# Patient Record
Sex: Female | Born: 1940 | Race: Black or African American | Hispanic: No | Smoking: Former smoker
Health system: Southern US, Community
[De-identification: ages and names within clinical notes are randomized; demographics above are authoritative.]

## PROBLEM LIST (undated history)

## (undated) DIAGNOSIS — M542 Cervicalgia: Secondary | ICD-10-CM

## (undated) DIAGNOSIS — M549 Dorsalgia, unspecified: Secondary | ICD-10-CM

## (undated) DIAGNOSIS — I10 Essential (primary) hypertension: Secondary | ICD-10-CM

## (undated) DIAGNOSIS — Z860100 Personal history of colon polyps, unspecified: Secondary | ICD-10-CM

## (undated) DIAGNOSIS — R51 Headache: Secondary | ICD-10-CM

## (undated) DIAGNOSIS — R238 Other skin changes: Secondary | ICD-10-CM

## (undated) DIAGNOSIS — J302 Other seasonal allergic rhinitis: Secondary | ICD-10-CM

## (undated) DIAGNOSIS — G8929 Other chronic pain: Secondary | ICD-10-CM

## (undated) DIAGNOSIS — M5430 Sciatica, unspecified side: Secondary | ICD-10-CM

## (undated) DIAGNOSIS — R233 Spontaneous ecchymoses: Secondary | ICD-10-CM

## (undated) DIAGNOSIS — I2510 Atherosclerotic heart disease of native coronary artery without angina pectoris: Secondary | ICD-10-CM

## (undated) DIAGNOSIS — M1990 Unspecified osteoarthritis, unspecified site: Secondary | ICD-10-CM

## (undated) DIAGNOSIS — F419 Anxiety disorder, unspecified: Secondary | ICD-10-CM

## (undated) DIAGNOSIS — E785 Hyperlipidemia, unspecified: Secondary | ICD-10-CM

## (undated) DIAGNOSIS — I739 Peripheral vascular disease, unspecified: Secondary | ICD-10-CM

## (undated) HISTORY — PX: CARDIAC CATHETERIZATION: SHX172

## (undated) HISTORY — PX: CATARACT EXTRACTION: SUR2

## (undated) HISTORY — PX: TONSILLECTOMY: SUR1361

## (undated) HISTORY — PX: ABDOMINAL HYSTERECTOMY: SHX81

## (undated) HISTORY — PX: BUNIONECTOMY: SHX129

## (undated) HISTORY — PX: ESOPHAGOGASTRODUODENOSCOPY: SHX1529

## (undated) HISTORY — DX: Peripheral vascular disease, unspecified: I73.9

## (undated) HISTORY — PX: COLONOSCOPY: SHX174

---

## 2000-06-08 ENCOUNTER — Encounter: Payer: Self-pay | Admitting: Family Medicine

## 2000-06-08 ENCOUNTER — Ambulatory Visit (HOSPITAL_COMMUNITY): Admission: RE | Admit: 2000-06-08 | Discharge: 2000-06-08 | Payer: Self-pay | Admitting: Family Medicine

## 2000-10-24 ENCOUNTER — Ambulatory Visit (HOSPITAL_COMMUNITY): Admission: RE | Admit: 2000-10-24 | Discharge: 2000-10-24 | Payer: Self-pay | Admitting: Cardiology

## 2000-10-25 ENCOUNTER — Ambulatory Visit (HOSPITAL_COMMUNITY): Admission: RE | Admit: 2000-10-25 | Discharge: 2000-10-25 | Payer: Self-pay | Admitting: *Deleted

## 2000-12-08 ENCOUNTER — Encounter: Payer: Self-pay | Admitting: Family Medicine

## 2000-12-08 ENCOUNTER — Ambulatory Visit (HOSPITAL_COMMUNITY): Admission: RE | Admit: 2000-12-08 | Discharge: 2000-12-08 | Payer: Self-pay | Admitting: Family Medicine

## 2001-02-05 ENCOUNTER — Encounter: Payer: Self-pay | Admitting: Family Medicine

## 2001-02-05 ENCOUNTER — Ambulatory Visit (HOSPITAL_COMMUNITY): Admission: RE | Admit: 2001-02-05 | Discharge: 2001-02-05 | Payer: Self-pay | Admitting: Family Medicine

## 2001-07-05 ENCOUNTER — Encounter: Payer: Self-pay | Admitting: Family Medicine

## 2001-07-05 ENCOUNTER — Ambulatory Visit (HOSPITAL_COMMUNITY): Admission: RE | Admit: 2001-07-05 | Discharge: 2001-07-05 | Payer: Self-pay | Admitting: Family Medicine

## 2001-08-30 ENCOUNTER — Encounter: Payer: Self-pay | Admitting: Emergency Medicine

## 2001-08-30 ENCOUNTER — Inpatient Hospital Stay (HOSPITAL_COMMUNITY): Admission: EM | Admit: 2001-08-30 | Discharge: 2001-08-31 | Payer: Self-pay | Admitting: Emergency Medicine

## 2001-09-04 ENCOUNTER — Encounter (HOSPITAL_COMMUNITY): Admission: RE | Admit: 2001-09-04 | Discharge: 2001-10-04 | Payer: Self-pay | Admitting: Family Medicine

## 2001-10-30 ENCOUNTER — Ambulatory Visit (HOSPITAL_COMMUNITY): Admission: RE | Admit: 2001-10-30 | Discharge: 2001-10-30 | Payer: Self-pay | Admitting: Family Medicine

## 2001-10-30 ENCOUNTER — Encounter: Payer: Self-pay | Admitting: Family Medicine

## 2001-12-07 ENCOUNTER — Encounter: Payer: Self-pay | Admitting: Family Medicine

## 2001-12-07 ENCOUNTER — Encounter (HOSPITAL_COMMUNITY): Admission: RE | Admit: 2001-12-07 | Discharge: 2002-01-06 | Payer: Self-pay | Admitting: Family Medicine

## 2002-04-03 ENCOUNTER — Encounter: Payer: Self-pay | Admitting: Internal Medicine

## 2002-04-03 ENCOUNTER — Encounter: Admission: RE | Admit: 2002-04-03 | Discharge: 2002-04-03 | Payer: Self-pay | Admitting: Internal Medicine

## 2002-09-18 ENCOUNTER — Encounter: Admission: RE | Admit: 2002-09-18 | Discharge: 2002-09-18 | Payer: Self-pay | Admitting: Gastroenterology

## 2002-09-18 ENCOUNTER — Encounter: Payer: Self-pay | Admitting: Gastroenterology

## 2002-10-02 ENCOUNTER — Ambulatory Visit (HOSPITAL_COMMUNITY): Admission: RE | Admit: 2002-10-02 | Discharge: 2002-10-02 | Payer: Self-pay | Admitting: Gastroenterology

## 2002-10-02 ENCOUNTER — Encounter (INDEPENDENT_AMBULATORY_CARE_PROVIDER_SITE_OTHER): Payer: Self-pay | Admitting: Specialist

## 2003-01-28 ENCOUNTER — Ambulatory Visit (HOSPITAL_COMMUNITY): Admission: RE | Admit: 2003-01-28 | Discharge: 2003-01-28 | Payer: Self-pay | Admitting: Internal Medicine

## 2003-03-12 ENCOUNTER — Encounter: Admission: RE | Admit: 2003-03-12 | Discharge: 2003-03-12 | Payer: Self-pay | Admitting: Internal Medicine

## 2003-04-18 ENCOUNTER — Encounter: Admission: RE | Admit: 2003-04-18 | Discharge: 2003-04-18 | Payer: Self-pay | Admitting: Internal Medicine

## 2003-11-30 ENCOUNTER — Ambulatory Visit (HOSPITAL_COMMUNITY): Admission: RE | Admit: 2003-11-30 | Discharge: 2003-11-30 | Payer: Self-pay | Admitting: Sports Medicine

## 2004-06-01 ENCOUNTER — Encounter: Admission: RE | Admit: 2004-06-01 | Discharge: 2004-06-01 | Payer: Self-pay | Admitting: Internal Medicine

## 2004-07-25 ENCOUNTER — Inpatient Hospital Stay (HOSPITAL_COMMUNITY): Admission: EM | Admit: 2004-07-25 | Discharge: 2004-07-27 | Payer: Self-pay | Admitting: Emergency Medicine

## 2004-07-25 ENCOUNTER — Ambulatory Visit: Payer: Self-pay | Admitting: *Deleted

## 2004-09-16 ENCOUNTER — Ambulatory Visit (HOSPITAL_COMMUNITY): Admission: RE | Admit: 2004-09-16 | Discharge: 2004-09-16 | Payer: Self-pay | Admitting: Family Medicine

## 2004-09-27 ENCOUNTER — Encounter: Admission: RE | Admit: 2004-09-27 | Discharge: 2004-09-27 | Payer: Self-pay | Admitting: Internal Medicine

## 2005-07-14 ENCOUNTER — Ambulatory Visit (HOSPITAL_COMMUNITY): Admission: RE | Admit: 2005-07-14 | Discharge: 2005-07-14 | Payer: Self-pay | Admitting: Internal Medicine

## 2005-08-03 ENCOUNTER — Ambulatory Visit (HOSPITAL_COMMUNITY): Admission: RE | Admit: 2005-08-03 | Discharge: 2005-08-03 | Payer: Self-pay | Admitting: Gastroenterology

## 2005-08-03 ENCOUNTER — Encounter (INDEPENDENT_AMBULATORY_CARE_PROVIDER_SITE_OTHER): Payer: Self-pay | Admitting: *Deleted

## 2005-10-03 ENCOUNTER — Emergency Department (HOSPITAL_COMMUNITY): Admission: EM | Admit: 2005-10-03 | Discharge: 2005-10-04 | Payer: Self-pay | Admitting: Emergency Medicine

## 2006-07-18 ENCOUNTER — Encounter: Admission: RE | Admit: 2006-07-18 | Discharge: 2006-07-18 | Payer: Self-pay | Admitting: Internal Medicine

## 2006-12-08 ENCOUNTER — Encounter: Admission: RE | Admit: 2006-12-08 | Discharge: 2006-12-08 | Payer: Self-pay | Admitting: Gastroenterology

## 2007-01-18 ENCOUNTER — Ambulatory Visit (HOSPITAL_COMMUNITY): Admission: RE | Admit: 2007-01-18 | Discharge: 2007-01-18 | Payer: Self-pay | Admitting: Gastroenterology

## 2007-02-15 HISTORY — PX: CHOLECYSTECTOMY: SHX55

## 2007-04-18 ENCOUNTER — Ambulatory Visit: Payer: Self-pay | Admitting: Cardiovascular Disease

## 2007-04-25 ENCOUNTER — Ambulatory Visit: Payer: Self-pay

## 2007-04-25 ENCOUNTER — Encounter: Payer: Self-pay | Admitting: Cardiovascular Disease

## 2007-06-15 ENCOUNTER — Encounter (INDEPENDENT_AMBULATORY_CARE_PROVIDER_SITE_OTHER): Payer: Self-pay | Admitting: General Surgery

## 2007-06-15 ENCOUNTER — Ambulatory Visit (HOSPITAL_COMMUNITY): Admission: RE | Admit: 2007-06-15 | Discharge: 2007-06-15 | Payer: Self-pay | Admitting: General Surgery

## 2007-08-20 ENCOUNTER — Encounter: Admission: RE | Admit: 2007-08-20 | Discharge: 2007-08-20 | Payer: Self-pay | Admitting: Internal Medicine

## 2008-01-22 ENCOUNTER — Inpatient Hospital Stay (HOSPITAL_COMMUNITY): Admission: AD | Admit: 2008-01-22 | Discharge: 2008-01-24 | Payer: Self-pay | Admitting: Cardiology

## 2008-02-21 ENCOUNTER — Encounter (HOSPITAL_COMMUNITY): Admission: RE | Admit: 2008-02-21 | Discharge: 2008-04-28 | Payer: Self-pay | Admitting: Cardiology

## 2008-07-19 ENCOUNTER — Encounter
Admission: RE | Admit: 2008-07-19 | Discharge: 2008-07-19 | Payer: Self-pay | Admitting: Physical Medicine and Rehabilitation

## 2008-08-20 ENCOUNTER — Ambulatory Visit (HOSPITAL_COMMUNITY): Admission: RE | Admit: 2008-08-20 | Discharge: 2008-08-20 | Payer: Self-pay | Admitting: Internal Medicine

## 2009-06-19 ENCOUNTER — Encounter: Admission: RE | Admit: 2009-06-19 | Discharge: 2009-06-19 | Payer: Self-pay | Admitting: Cardiology

## 2009-06-23 ENCOUNTER — Ambulatory Visit (HOSPITAL_COMMUNITY): Admission: RE | Admit: 2009-06-23 | Discharge: 2009-06-23 | Payer: Self-pay | Admitting: Cardiology

## 2009-08-25 ENCOUNTER — Ambulatory Visit (HOSPITAL_COMMUNITY): Admission: RE | Admit: 2009-08-25 | Discharge: 2009-08-25 | Payer: Self-pay | Admitting: Internal Medicine

## 2010-03-07 ENCOUNTER — Encounter: Payer: Self-pay | Admitting: Internal Medicine

## 2010-05-04 LAB — POCT I-STAT 3, VENOUS BLOOD GAS (G3P V)
Acid-base deficit: 1 mmol/L (ref 0.0–2.0)
Bicarbonate: 24.6 mEq/L — ABNORMAL HIGH (ref 20.0–24.0)
O2 Saturation: 60 %
TCO2: 26 mmol/L (ref 0–100)
pCO2, Ven: 43.6 mmHg — ABNORMAL LOW (ref 45.0–50.0)
pH, Ven: 7.36 — ABNORMAL HIGH (ref 7.250–7.300)
pO2, Ven: 33 mmHg (ref 30.0–45.0)

## 2010-05-04 LAB — GLUCOSE, CAPILLARY
Glucose-Capillary: 153 mg/dL — ABNORMAL HIGH (ref 70–99)
Glucose-Capillary: 72 mg/dL (ref 70–99)

## 2010-05-04 LAB — POCT I-STAT 3, ART BLOOD GAS (G3+)
Acid-Base Excess: 1 mmol/L (ref 0.0–2.0)
Bicarbonate: 25.6 mEq/L — ABNORMAL HIGH (ref 20.0–24.0)
O2 Saturation: 88 %
TCO2: 27 mmol/L (ref 0–100)
pCO2 arterial: 41.2 mmHg (ref 35.0–45.0)
pO2, Arterial: 55 mmHg — ABNORMAL LOW (ref 80.0–100.0)

## 2010-05-31 LAB — GLUCOSE, CAPILLARY: Glucose-Capillary: 102 mg/dL — ABNORMAL HIGH (ref 70–99)

## 2010-06-15 ENCOUNTER — Emergency Department (HOSPITAL_COMMUNITY): Payer: Medicare Other

## 2010-06-15 ENCOUNTER — Emergency Department (HOSPITAL_COMMUNITY)
Admission: EM | Admit: 2010-06-15 | Discharge: 2010-06-15 | Disposition: A | Discharge status: Home / Self Care | Payer: Medicare Other | Attending: Emergency Medicine | Admitting: Emergency Medicine

## 2010-06-15 DIAGNOSIS — R05 Cough: Secondary | ICD-10-CM | POA: Insufficient documentation

## 2010-06-15 DIAGNOSIS — I2510 Atherosclerotic heart disease of native coronary artery without angina pectoris: Secondary | ICD-10-CM | POA: Insufficient documentation

## 2010-06-15 DIAGNOSIS — J40 Bronchitis, not specified as acute or chronic: Secondary | ICD-10-CM | POA: Insufficient documentation

## 2010-06-15 DIAGNOSIS — R062 Wheezing: Secondary | ICD-10-CM | POA: Insufficient documentation

## 2010-06-15 DIAGNOSIS — I10 Essential (primary) hypertension: Secondary | ICD-10-CM | POA: Insufficient documentation

## 2010-06-15 DIAGNOSIS — R609 Edema, unspecified: Secondary | ICD-10-CM | POA: Insufficient documentation

## 2010-06-15 DIAGNOSIS — M7989 Other specified soft tissue disorders: Secondary | ICD-10-CM | POA: Insufficient documentation

## 2010-06-15 DIAGNOSIS — R059 Cough, unspecified: Secondary | ICD-10-CM | POA: Insufficient documentation

## 2010-06-15 DIAGNOSIS — R0602 Shortness of breath: Secondary | ICD-10-CM | POA: Insufficient documentation

## 2010-06-15 LAB — BASIC METABOLIC PANEL
CO2: 29 mEq/L (ref 19–32)
Calcium: 9.7 mg/dL (ref 8.4–10.5)
GFR calc Af Amer: >60 mL/min (ref >60)
GFR calc non Af Amer: 53 mL/min — ABNORMAL LOW (ref >60)
Sodium: 143 mEq/L (ref 135–145)

## 2010-06-15 LAB — DIFFERENTIAL
Basophils Absolute: 0 10*3/uL (ref 0.0–0.1)
Lymphocytes Relative: 31 % (ref 12–46)
Monocytes Absolute: 1 10*3/uL (ref 0.1–1.0)
Monocytes Relative: 8 % (ref 3–12)
Neutro Abs: 7.1 10*3/uL (ref 1.7–7.7)

## 2010-06-15 LAB — POCT CARDIAC MARKERS
CKMB, poc: <1 ng/mL — ABNORMAL LOW (ref 1.0–8.0)
Myoglobin, poc: 85 ng/mL (ref 12–200)

## 2010-06-15 LAB — CBC
HCT: 34.4 % — ABNORMAL LOW (ref 36.0–46.0)
Hemoglobin: 11 g/dL — ABNORMAL LOW (ref 12.0–15.0)
MCHC: 32 g/dL (ref 30.0–36.0)

## 2010-06-29 NOTE — Assessment & Plan Note (Signed)
[facility]                            CARDIOLOGY OFFICE NOTE   NAME:Kathryn [name]                  MRN:          [id number]  DATE:04/18/2007                            DOB:          Jul 10, 1940    [name] a 70 year old patient referred for chest pain, abnormal  EKG.  Coronary risk factors including diabetes and the patient is a  nonsmoker. Family history is remarkable for premature coronary disease  in the mother's side.  Cholesterol status is unknown and the patient has  significant hypertension.  The patient's chest pain was reactive about 3  weeks ago.  Her sister died of stomach problems.  The patient initially  thought she had significant stress.  She had substernal chest pain and  there was some radiation to the left arm.  There is no associated  diaphoresis.  She has continued right neck pain which sounds poor  muscular now.  In general the patient is fairly sedentary.  She does not  exercise but she does run about volunteering a lot.  In the last 2-1/2  weeks she has not had any recurrent pains.   She has not had any PND, orthopnea.  There has been no cough or  pleuritic component.   In talking to the patient, she said she had a stress test many years ago  at [facility], but nothing recently.   The pain was clearly aggravated by the stress of her sister's illness  and death.   ROS: The patient's review of systems otherwise negative.   CRF's: In regards to her risk factors, she has been on blood pressure  medicine for a while.  She says she was on the verge of diabetes and was  just placed on sugar pills within the last year.   PMH: The the patient's past medical history is otherwise remarkable for  tonsillectomy and hysterectomy, history of allergies, central obesity,  diabetes and hypertension.  I do not have cholesterol on the patient.   There is also question history of asthma.   Allergies:The patient has an  intolerance to CODEINE.  She also indicates an intolerance to ASPIRIN along with her asthma.   Social:The patient is married.  Her husband is a diabetic.  She has 4  children who are well.  She does a lot of volunteering at [employer].  She does not smoke or drink.  She does all of her activities of daily living and continues to drive.   FAMILY HISTORY:  Remarkable for mother dying prematurely of heart  disease.  She does not recollect how her father passed.   Meds:Her current medications include Janumet 50/500 1 tab a day,  furosemide question dose, Diovan hydrochlorothiazide 160.12.5.   EXAM:  Remarkable for an overweight black female in no distress.  Blood pressure is 150/80, pulse 67 regular, respiratory rate 14, weight  211.  Affect appropriate.  HEENT:  Unremarkable.  Carotids are normal without bruit, no lymphadenopathy, thyromegaly, or  JVP elevation.  LUNGS:  Clear with good diaphragmatic motion.  No wheezing.  S1-S2 with normal heart sounds.  PMI normal.  ABDOMEN:  Benign.  Bowel sounds positive.  No bruit, no AAA.  No  hepatosplenomegaly or hepatojugular reflux.  No tenderness.  No bruit.  Distal pulses intact, no edema.  NEURO:  Nonfocal.  SKIN:  Warm and dry.  No muscular weakness.    EKG shows sinus rhythm with poor R wave progression, cannot rule out  anterior MI.   IMPRESSION:  1. Chest pain in the setting of diabetes, hypertension, positive      family history, and abnormal EKG.  Follow-up stress Myoview      indicated.  The patient cannot take aspirin due to intolerance.  2. Hypertension currently well controlled.  Continue current dose of      Diovan and diuretic.  3. Lipid status unknown.  Likely high.  Would probably benefit from      statin therapy.  Will leave this up to Dr. Allyne Gee.  4. Resolving grief from sister's death.  Seems to be doing better.      Consider institution of SSRI or other antidepressant.  5. Diabetes.  Continue to check hemoglobin  A1c quarterly.  Continue      Janumet.  6. Abnormal EKG with poor R wave progression, question anterior      myocardial infarction.  I think it is reasonable to a 2-D      echocardiogram to assess RV and LV function and rule out structural      heart disease and in addition to her stress test.  So long as her      stress test and Myoview are normal,  she will follow with Dr.      Allyne Gee.  If they are abnormal I will see her ASAP.     Noralyn Pick. Eden Emms, MD, [facility]  Electronically Signed    PCN/MedQ  DD: 04/18/2007  DT: 04/18/2007  Job #: [id number]   cc:   Candyce Churn. Allyne Gee, M.D.

## 2010-06-29 NOTE — Op Note (Signed)
NAMEMARCELE, Kathryn [name]           ACCOUNT NO.:  [id number]   MEDICAL RECORD NO.:  [id number]          PATIENT TYPE:  AMB   LOCATION:  SDS                          FACILITY:  [facility]   PHYSICIAN:  Cherylynn Ridges, M.D.    DATE OF BIRTH:  [birth year]   DATE OF PROCEDURE:  06/15/2007  DATE OF DISCHARGE:                               OPERATIVE REPORT   PREOPERATIVE DIAGNOSES:  1. Biliary dyskinesia.  2. Supraumbilical ventral hernia.   POSTOPERATIVE DIAGNOSES:  1. Biliary dyskinesia.  2. Adhesions.  3. Small umbilical hernia.   PROCEDURES:  1. Laparoscopic cholecystectomy with cholangiogram.  2. Enterolysis.  3. Repair of umbilical hernia defects.   SURGEON:  Cherylynn Ridges, MD.   ASSISTANT:  Dr. Janee Morn.   ANESTHESIA:  General endotracheal.   ESTIMATED BLOOD LOSS:  Less than 20 mL.   COMPLICATIONS:  None.   CONDITION:  Stable.   FINDINGS:  The patient had a very small umbilical defect which was  closed with a laparoscopic repair.  She had a single loop of small bowel  in the pelvic area adhesed through the abdominal wall which was twisted  of apparent potential obstruction.   Intraoperative cholangiogram was normal.  There were some chronic  inflammatory adhesions to the gallbladder.   INDICATIONS FOR OPERATION:  The patient is a 70 year old female with  symptomatic biliary dyskinesia and a small umbilical defect who comes in  for operation.   OPERATION:  The patient was taken to the operating room placed on table  in supine position.  After an adequate general endotracheal anesthetic  was administered, she was prepped and draped in the usual sterile manner  and exposed in the midline of the abdomen.   A supraumbilical curvilinear incision about 4 cm long was made using #11  blade taking down to midline fascia.  We could palpate a small umbilical  defect.  We incorporated that into the fascial incision that was made  longitudinally with #15 blade as we bluntly  dissected down into the  peritoneal cavity.  A purse-string suture of 0 Vicryl was passed and  then a Hasson cannula passed into the peritoneal cavity.  It was secured  in place with purse-string suture.   Once inflated with carbon dioxide gas up to a maximum level of  intraabdominal pressure of 15 mmHg.  Two right costal margin of 5-mm  cannulas in the subxiphoid 12-mm cannula passed under direct vision.  The patient was placed in reverse Trendelenburg, the left side was  tilted down and dissection was began.   The gallbladder was grabbed and retracted towards the anterior abdominal  wall and the right upper quadrant.  There were noted adhesions to the  body and the infundibulum of the gallbladder which was bluntly dissected  away exposing the triangle of Calot and hepatoduodenal triangle.  We  dissected out the cystic duct and the cystic artery from these  structures.  We placed Calot along the gallbladder side of the cystic  duct, then made a cholecystodochotomy using the laparoscopic scissors  through which a Cook catheter was passed for the cholangiogram.  This  showed good flow into the duodenum.  No ductal defects.  No distention  or dilatation.  Good proximal flow.   Once cholangiogram was completed, the clip securing the catheter in  place was removed.  We EndoClip the distal cystic duct x3, the catheter  was removed and transected the cystic duct.  Cystic artery was found in  triangle of Calot which was ligated and clipped with EndoClip x2 and  then transected.  We then dissected out the gallbladder from its bed  with minimal difficulty.  We retrieved it from the supraumbilical site.  We closed out the fascia at the supraumbilical site using a purse-string  suture.  As soon as inspecting in that area, we could see a single loop  of small bowel attached to the anterior abdominal wall with possible  twisting.  Therefore, we carefully lysed the adhesions to the structure  using  laparoscopic scissors.  Care was taken to stay away from the bowel  itself and no injury to the bowel was noted.   After we completed the enterolysis and irrigated with small amount of  saline in the right upper quadrant and aspirated all fluid and gas from  above the liver.  We removed all instruments and closed the skin at all  sites using running subcuticular stitch of 4-0 Vicryl at the  supraumbilical and the subxiphoid site; however, the lateral cannula  sites were closed with Dermabond only.  A 0.25% Marcaine with  epinephrine was injected at all sites.  Sterile dressings were applied  including Dermabond, Tegaderm, and Steri-Strips.      Cherylynn Ridges, M.D.  Electronically Signed     JOW/MEDQ  D:  06/15/2007  T:  06/16/2007  Job:  [id number]

## 2010-06-29 NOTE — Cardiovascular Report (Signed)
Kathryn [name], VIEN           ACCOUNT NO.:  [id number]   MEDICAL RECORD NO.:  [id number]          PATIENT TYPE:  INP   LOCATION:  6524                         FACILITY:  [facility]   PHYSICIAN:  Cristy Hilts. Jacinto Halim, MD       DATE OF BIRTH:  Jul 06, 1940   DATE OF PROCEDURE:  01/22/2008  DATE OF DISCHARGE:                            CARDIAC CATHETERIZATION   PROCEDURE PERFORMED:  Percutaneous transluminal coronary angioplasty and  stenting of the mid circumflex coronary artery.   INDICATIONS:  [name] is a 69 year old female with history of  hypertension, hyperlipidemia, history of diet-controlled diabetes who  has been complaining of exertional angina pectoris.  She had undergone  diagnostic cardiac catheterization a week ago at [facility].  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  Given this, she is now brought to the  catheterization lab for elective angioplasty of the same.   Please see my dictation for diagnostic cardiac catheterization report of  the left heart catheterization essentially revealing normal left main,  normal LAD with mild luminal irregularity, large ramus intermediate, and  a large circumflex with a high grade 98% stenosis at the bifurcation of  AV groove branch.   INTERVENTIONAL DATA:  Successful PTCA and stenting of the mid circumflex  coronary artery with implantation of a 2.75 x 15-mm XIENCE stent,  deployed at 16 atmospheric pressure with overall reduction of stenosis  from 90% to 0% with brisk TIMI III to TIMI III flow maintained at the  end of the procedure.   A total of 110 mL of contrast was utilized for diagnostic and  interventional procedure.   RECOMMENDATIONS:  She will need continued risk factor modification.  She  will need Plavix for at least a period of a year and aspirin  indefinitely.   TECHNICAL PROCEDURE:  Under usual sterile precautions using a Doppler  needle, I was able to  obtain right femoral arterial access with  difficulty.  Then a 7-French Voda 3.5 guide was utilized to engage left  main coronary artery because of severe ventricularization.  A 7-French  FL-4 with side hole was utilized to engage left main coronary artery.  Using Angiomax for anticoagulation Asahi Prowater was advanced to the  circumflex of coronary artery.  A predilatation with a 2.5 x 15-mm  Voyager balloon was performed at around 12 atmospheric pressure for 45  seconds and intracoronary nitroglycerin was administered.  There was  significant recoil noted.  Hence it was stented with a 2.75 x 15-mm  XIENCE stent at 16 atmospheric pressure for about 30 seconds.  Angiography revealed  excellent results without any residual dissection or thrombus.  Intracoronary nitroglycerin was administered.  Guidewire was withdrawn  angiographically.  Guide catheter disengaged and pulled out of the body.  The patient tolerated the procedure.  No immediate complication noted.      Cristy Hilts. Jacinto Halim, MD  Electronically Signed     JRG/MEDQ  D:  01/22/2008  T:  01/23/2008  Job:  [id number]   cc:   Candyce Churn. Allyne Gee, M.D.

## 2010-06-29 NOTE — Discharge Summary (Signed)
[name], Kathryn [name]           ACCOUNT NO.:  [id number]   MEDICAL RECORD NO.:  [id number]          PATIENT TYPE:  INP   LOCATION:  6524                         FACILITY:  [facility]   PHYSICIAN:  Cristy Hilts. Jacinto Halim, MD       DATE OF BIRTH:  Aug 11, 1940   DATE OF ADMISSION:  01/22/2008  DATE OF DISCHARGE:  01/24/2008                               DISCHARGE SUMMARY   DISCHARGE DIAGNOSES:  1. Elective circumflex percutaneous coronary intervention and stenting      this admission with a small CK-MB bump to 155 with 11 MBs post      percutaneous coronary intervention.  2. Non-insulin dependant diabetes.  3. Treated hypertension with eczema.  4. Treated dyslipidemia.   HOSPITAL COURSE:  [name] is a pleasant 70 year old female who had  been seen by Dr. Elsie Lincoln in the past.  She was referred by Dr. Allyne Gee  for evaluation of chest pain and was seen back in November.  She had a  negative Cardiolite in 2007.  She does have risk factors for coronary  artery disease.  The patient was seen by Dr. Jacinto Halim.  He set her up for  elective catheterization, which was done at the [facility] on January 15, 2008.  This revealed 50% RCA proximally, 40% distal RCA normal left  main, 30%, mid LAD narrowing, and an 85% narrowing of the circumflex  after the OM1.  The OM1 itself was a small vessel with an 80% proximal  narrowing.  Renal arteries were normal as were the iliacs.  Her EF was  60%.  She was set up for elective circumflex intervention, which was  done January 22, 2008, by Dr. Jacinto Halim.  Post PCI, her CKs peaked to 155  with 11 MBs.  We kept her on extra 24 hours.  We feel she can be  discharged on January 24, 2008, pending her followup enzymes.   DISCHARGE MEDICATIONS:  1. Plavix 75 mg a day.  2. Diovan and hydrochlorothiazide 160/12.5 daily.  3. She will resume her Janumet tomorrow on January 25, 2008.  4. Aspirin 81 mg 2 tablets a day.  5. Crestor 10 mg a day.  6. Coreg 6.25 mg b.i.d.  7.  Nitroglycerin sublingual p.r.n.   LABORATORY DATA:  White count 10.0, hemoglobin 10.8, hematocrit 33.6,  platelets 315.  At discharge, her fourth troponin is pending, but her  peak troponin was 0.64.  Sodium 139, potassium 2.7, BUN 11, creatinine  0.87.  EKG shows sinus rhythm without acute changes.   DISPOSITION:  The patient will be discharged today pending her fourth  troponin level.  She will follow up with Dr. Jacinto Halim and Dr. Allyne Gee.      Abelino Derrick, P.A.      Cristy Hilts. Jacinto Halim, MD  Electronically Signed    LKK/MEDQ  D:  01/24/2008  T:  01/24/2008  Job:  [id number]   cc:   Candyce Churn. Allyne Gee, M.D.

## 2010-07-02 NOTE — Op Note (Signed)
   NAMEKATRIN, Kathryn [name]                     ACCOUNT NO.:  [id number]   MEDICAL RECORD NO.:  [id number]                   PATIENT TYPE:  AMB   LOCATION:  ENDO                                 FACILITY:  [facility]   PHYSICIAN:  Anselmo Rod, M.D.               DATE OF BIRTH:  [birth year]   DATE OF PROCEDURE:  10/02/2002  DATE OF DISCHARGE:                                 OPERATIVE REPORT   PROCEDURE PERFORMED:  Esophagogastroduodenoscopy.   ENDOSCOPIST:  Anselmo Rod, M.D.   INSTRUMENT USED:  Olympus video panendoscope.   INDICATION FOR PROCEDURE:  Dysphagia in a 70 year old African-American  female.  Rule out stricture, papilloma, esophagitis, etc.   PREPROCEDURE PREPARATION:  Informed consent was procured from the patient.  The patient was fasted for eight hours prior to the procedure.   PREPROCEDURE PHYSICAL:  VITAL SIGNS:  The patient had stable vital signs.  NECK:  Supple.  CHEST:  Clear to auscultation.  S1, S2 regular.  ABDOMEN:  Soft with normal bowel sounds.   DESCRIPTION OF PROCEDURE:  The patient was placed in the left lateral  decubitus position and sedated with 60 mg of Demerol and 6 mg of Versed  intravenously.  Once the patient was adequately sedate and maintained on low-  flow oxygen and continuous cardiac monitoring, the Olympus video  panendoscope was advanced through the mouthpiece, over the tongue, into the  esophagus under direct vision.  The entire esophagus appeared normal with no  evidence of strictures, masses, esophagitis, or Barrett's mucosa.  The scope  was then advanced into the stomach.  The entire gastric mucosa appeared  healthy.  Retroflexion in the high cardia revealed no abnormalities.  The  proximal small bowel appeared normal.   IMPRESSION:  Normal esophagogastroduodenoscopy with widely patent esophagus.   RECOMMENDATIONS:  Proceed with a colonoscopy at this time.                                               Anselmo Rod,  M.D.   JNM/MEDQ  D:  10/02/2002  T:  10/03/2002  Job:  [id number]   cc:   Candyce Churn. Allyne Gee, M.D.  [address]  [address]  [place]  [place] [address]  Fax: [phone]

## 2010-07-02 NOTE — Discharge Summary (Signed)
   NAMEHEIDY, Kathryn [name]                       ACCOUNT NO.:  [id number]   MEDICAL RECORD NO.:  [id number]                  PATIENT TYPE:   LOCATION:                                       FACILITY:   PHYSICIAN:  Donna Bernard, M.D.             DATE OF BIRTH:   DATE OF ADMISSION:  DATE OF DISCHARGE:  08/31/2001                                 DISCHARGE SUMMARY   FINAL DIAGNOSIS:  Chest pain.   FINAL DISPOSITION:  1. The patient is discharged to home.  2. The patient is to maintain all her usual medicines.  3. Darvocet-N 100 one q.4-6h. p.r.n. for pain.   INITIAL HISTORY AND PHYSICAL:  Please see H&P as dictated.   HOSPITAL COURSE:  This patient is a 70 year old female who presented to the  emergency room on the day of admission with several hours history of chest  pain off and on.  The patient woke up and had an episode of vomiting after  taking Ultracet.  The patient had chest pain.  She was seen in the emergency  room.  She was given nitroglycerin which seemed to help somewhat.  The  patient was admitted to the hospital.  She was placed on rule out MI  protocol.  Of note, the cardiac enzymes came back normal.  Telemetry  remained normal.  Of note, the patient had catheterization approximately one  year prior to admission with no significant blockage of the coronary  arteries at that time.  The morning following admission, the patient was  feeling better.  She was describing her pain as sharp.  If she took a deep  breath, she could feel it in the anterior chest.  In addition, the patient  noted tenderness when pressed in the sternal region.  She felt that the  majority of her chest pain was due to musculoskeletal features.   This was discussed with the patient and she was discharged home with the  diagnosis and disposition as noted above.                                               Donna Bernard, M.D.    [name]  D:  01/14/2002  T:  01/14/2002  Job:   [id number]

## 2010-07-02 NOTE — Op Note (Signed)
Kathryn [name], Kathryn [name]           ACCOUNT NO.:  [id number]   MEDICAL RECORD NO.:  [id number]          PATIENT TYPE:  AMB   LOCATION:  ENDO                         FACILITY:  [facility]   PHYSICIAN:  Anselmo Rod, M.D.  DATE OF BIRTH:  1940/07/25   DATE OF PROCEDURE:  08/03/2005  DATE OF DISCHARGE:                                 OPERATIVE REPORT   PROCEDURE PERFORMED:  Colonoscopy with multiple cold biopsies.   ENDOSCOPIST:  Anselmo Rod, M.D.   INSTRUMENT USED:  Olympus video colonoscope.   INDICATION FOR PROCEDURE:  [age]-year-old African American female  undergoing screening colonoscopy to rule out colon polyps, masses, etc.  The  patient has a history of fecal incontinence, rule out inflammatory bowel  disease.   PREPROCEDURE PREPARATION:  Informed consent was procured from the patient.  The patient was fasted for 4 hours prior to the procedure and prepped with  32 Osmoprep pill the night of and on the morning of the procedure.  Risks  and benefits of the procedure including a 10% miss rate of cancer in polyps  were discussed with the patient as well.   PREPROCEDURE PHYSICAL:  VITAL SIGNS: The patient had stable vital signs.  NECK: Supple.  CHEST: Clear to auscultation.  CARDIAC: S1 and S2 regular.  ABDOMEN: Soft with normal bowel sounds.   DESCRIPTION OF THE PROCEDURE:  The patient was placed in the left lateral  decubitus position and sedated with 100 mcg of fentanyl and 10 mg of Versed  in slow incremental doses.  Once the patient was adequately sedate and  maintained on low-flow oxygen and continuous cardiac monitoring, the Olympus  video colonoscope was advanced from the rectum to the cecum.  The  appendiceal orifice and ileocecal valve were clearly visualized and  photographed.  A small cecal AVM was noted; this was not bleeding and  therefore no intervention was undertaken. Multiple small sessile polyps were  biopsied from the rectosigmoid colon.  A small  sessile polyp was biopsied  from the proximal right colon.  There were scattered diverticula seen  throughout the colon.  Retroflexion in the rectum revealed no abnormalities.  The patient tolerated the procedure well without immediate complications.   IMPRESSION:  1.Multiple small colonic polyps, removed by cold biopsy, some  from the rectosigmoid colon, 1 from the proximal right colon.  2.Scattered diverticulosis.   RECOMMENDATIONS:  1.Await pathology results.  2.Avoid all nonsteroidals including aspirin for the next 2 weeks.  3.Repeat colonoscopy depending on pathology results.  4.Outpatient followup in the next 2 weeks for further recommendations.  5.The importance of a high-fiber diet has been discussed with her and  brochures on diverticulosis have been given to her for her education.      Anselmo Rod, M.D.  Electronically Signed     JNM/MEDQ  D:  08/03/2005  T:  08/04/2005  Job:  [id number]   cc:   Candyce Churn. Allyne Gee, M.D.  Fax: [phone]

## 2010-07-02 NOTE — Procedures (Signed)
[facility]  Patient:    Kathryn [name], Kathryn [name] Visit Number: [id number] MRN: [id number]          Service Type: [facility] Location: [facility] Attending Physician:  Lilyan Punt Dictated by:   Kari Baars, M.D. Proc. Date: 08/30/01 Admit Date:  09/04/2001                            EKG Interpretations  TIME/DATE:  1657, August 30, 2001  DESCRIPTION:  The rhythm is sinus rhythm with a rate in the 80s.  Otherwise, normal electrocardiogram. Dictated by:   Kari Baars, M.D. Attending Physician:  Lilyan Punt DD:  08/30/01 TD:  09/05/01 Job: [id number]

## 2010-07-02 NOTE — Procedures (Signed)
   Kathryn [name], Kathryn [name]                     ACCOUNT NO.:  [id number]   MEDICAL RECORD NO.:  [id number]                   PATIENT TYPE:  OUT   LOCATION:  RAD                                  FACILITY:  [facility]   PHYSICIAN:  Donna Bernard, M.D.             DATE OF BIRTH:  10-20-1940   DATE OF PROCEDURE:  08/31/2001  DATE OF DISCHARGE:  10/30/2001                                EKG INTERPRETATION   EKG reveals normal sinus rhythm with no significant ST-T changes.                                               Donna Bernard, M.D.    [name]  D:  12/03/2001  T:  12/03/2001  Job:  [id number]

## 2010-07-02 NOTE — H&P (Signed)
Kathryn [name], Kathryn NO.:  [id number]   MEDICAL RECORD NO.:  [id number]          PATIENT TYPE:  EMS   LOCATION:  MAJO                         FACILITY:  [facility]   PHYSICIAN:  Elliot Cousin, M.D.    DATE OF BIRTH:  Aug 05, 1940   DATE OF ADMISSION:  07/25/2004  DATE OF DISCHARGE:                                HISTORY & PHYSICAL   PRIMARY CARE PHYSICIAN:  Kathryn [name], M.D.   CHIEF COMPLAINT:  Chest pain, swollen lips, and rash.   HISTORY OF PRESENT ILLNESS:  Kathryn [name] is a 70 year old lady with a past  medical history significant for asthma, anxiety, and chest pain, who  presents to the emergency department with a chief complaint of chest pain  which started early this morning. The patient states that she got up out of  bed this morning in no acute distress. As she was moving around she  developed chest pain which was in the central part of her chest and a little  to the right of center. Over the course of the day the chest pain has come  and gone. It usually lasts between 1-5 minutes and resolves spontaneously.  The pain is described as achy and at its worse the intensity was 8/10. It is  now 1 to 2 over 10. She had associated nausea and indigestion, but no  vomiting, no lightheadedness, no diaphoresis, no radiation to her arms, no  pleurisy, and no upper respiratory tract infection symptoms. She did have  some transient shortness of breath. She also had associated palpitations.  She has had a dry cough over the past week. She denies fever and chills.   The patient also complains of lip swelling. Approximately 20 minutes after  she developed chest pain, the patient's bottom lip began swelling. This was  followed by her upper lip swelling and a diffuse rash described as welts  which started on her chin and then spread to her torso, her back and her  arms. The patient denies any difficulty breathing or swelling of her throat.  She denies any recent changes  in soaps, detergents, lotions, or perfumes.  She did not have anything to eat this morning. She did not take any new  medications this morning. Her diet has been more or less unchanged over the  past few days.  She denies any insect bites. The rash itches.  The patient's  brother died of an unexpected heart attack four days ago. Her brother's  funeral was this morning. The patient believes that her symptoms may be  secondary to anxiety surrounding her brother's death. The patient's family  history is significantly positive for coronary artery disease. Not only has  a brother recently died from a myocardial infarction, the patient's mother  also died of a myocardial infarction and she also had another brother who  died of a myocardial infarction.   When the patient was evaluated in the emergency department, her EKG was  within normal limits with no acute abnormalities. The EKG revealed a normal  sinus rhythm with a heart rate of 87 beats per minute. Her chest x-ray  revealed minimal bibasilar interstitial edema and cardiomegaly. She was  hemodynamically stable. Her cardiac markers were essentially negative. She  is chest pain free now( no nitro given).  However, given the patient's  family history, she will be admitted for further evaluation and management.   PAST MEDICAL HISTORY:  1.  History of chest pain in July 2003, admitted to [facility].      Myocardial infarction ruled out. The patient also has a history of      cardiac catheterization in 2001 or 2002 at [facility]. I am      unable to locate the results of the cardiac catheterization. The patient      was told that she had a little blockage.  2.  History of rectal bleeding. Colonoscopy in August 2004 per Dr. Loreta [name]      revealed hemorrhoids, left-sided diverticulosis, left-sided polyps,      status post polypectomy, and AVM. The pathology report revealed      adenomatous polyps and hyperplastic polyps. The EGD in  August 2004, per      Dr. Loreta [name], was within normal limits.  3.  Asthma.  4.  Anxiety.  5.  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  6.  Status post tonsillectomy in the past.   MEDICATIONS:  1.  A recent prescription for a pain pill, name unknown.  2.  Albuterol MDI two puffs p.r.n., the patient usually requires albuterol      once or twice per week.  3.  Albuterol nebulizer once or twice per month as needed.  4.  Xanax 0.5 mg b.i.d. p.r.n.  5.  Tylenol over-the-counter as needed.  6.  Vitamin C 500 mg daily.   ALLERGIES:  The patient has an allergy to CODEINE which causes jitteriness.   FAMILY HISTORY:  One brother recently died of a myocardial infarction at 70  years of age. She had another brother who died of a myocardial infarction in  his 77s. Her mother died of a myocardial infarction at 71 years of age. She  has one sister who is living, she is 26 years of age, and also has a history  of myocardial infarction, status post CABG. Her father died of colon cancer  in his 54s.   SOCIAL HISTORY:  The patient is married and lives in [place], [place]  [place]. She has four children. She is retired from [employer]. She stopped smoking in the 1980s after smoking for approximately 20  years. She denies alcohol or illicit drug use. She still drives and is  fairly independent.   REVIEW OF SYSTEMS:  The patient's review of systems is essentially negative  with the exception of transient left facial pain a few days ago.   PHYSICAL EXAMINATION:  VITAL SIGNS: Temperature 98.2, blood pressure 133/80,  pulse 72, respiratory rate 20, oxygen saturation 98% on room air.  GENERAL: The patient is a pleasant, obese 70 year old [place] woman  who is currently lying in bed in no acute distress.  HEENT: Head is normocephalic and atraumatic. Pupils equal, round, and reactive to light. Extraocular movements are  intact. Conjunctiva are clear.  Sclerae are white.  Tympanic membranes are clear bilaterally.  Nasal mucosa  is mildly dry. No sinus tenderness.  No drainage.  Oropharynx reveals mildly  dry mucous membranes. There is no evidence of oropharyngeal edema, although  the patient's lips are mildly swollen. There  is some mild swelling of the  chin as well. No posterior exudates or erythema.  NECK: Obese and supple with no adenopathy, thyromegaly, bruits, or JVD.  LUNGS: Clear to auscultation bilaterally. Breathing is nonlabored.  HEART: Distant S1 and S2 with no murmurs, rubs, or gallops.  ABDOMEN: Obese, positive bowel sounds, soft, nontender, nondistended. No  hepatosplenomegaly. No masses palpated.  GU/RECTAL EXAM: Deferred.  EXTREMITIES: Pedal pulses 2+ bilaterally. No pretibial edema. No pedal  edema. The patient has a good range of motion of her extremities. No acute  joint abnormalities.  NEUROLOGIC: The patient is alert and oriented times three. Cranial nerves II-  XII are intact. Strength is 5/5 throughout. Sensation is intact.  SKIN: The patient has mild macules which are fading quickly over the  anterior torso and proximal arms. Apparently the urticarial rash has more or  less resolved. As noted above, the patient does have mild swelling of her  lips, but no urticarial rash or macular rash is seen on the face at this  time.   ADMISSION LABORATORY:  Chest x-ray reveals minimal bibasilar interstitial  edema and cardiomegaly (one view). CT scan of the head reveals minimal small  vessel disease, negative acute disease. EKG reveals normal sinus rhythm  without any acute abnormalities. Heart rate 87 beats per minute. Sodium 138,  potassium 6.3 repeated at 4.0, chloride 110, BUN 16, glucose 92, bicarbonate  26.7, creatinine 1.1. WBC 9.2, hemoglobin 12.4, hematocrit 39.1, MCV 71,  platelet count 348,000. CK-MB 1.6, repeated at 1.3, repeated at 1.2;  myoglobin 83.6, repeated at 90.4,  repeated at 62.9; troponin-I less than  0.05, repeated at less than 0.05, repeated at less than 0.05. BNP less than  30.   ASSESSMENT:  1.  Precordial chest pain. The patient is chest pain free without treatment      with sublingual nitroglycerin. Certainly the patient is at risk for      coronary artery disease.  The patient does give a history of cardiac      catheterization in either 2001 or 2002 with the reported results      consistent with nonobstructive coronary artery disease.  The patient's      EKG and cardiac markers are essentially negative. However, cardiac      enzymes will need to be ordered every eight hours times three to rule      out myocardial infarction. As stated above, the patient's family history      is significant for coronary artery disease.  2.  Urticaria with oral/labial swelling. The patient presented with an      urticarial rash and mild angioedema of her lips. The patient has no     history of being treated with an ACE inhibitor. The etiology is unclear      given her history. Her symptoms may be associated with anxiety,nerves.      The patient's brother died unexpectedly of a myocardial infarction four      days ago and his funeral was this morning. The patient admits that he      has had some recent anxiety which has been treated with Xanax by her      primary care physician, Dr. Allyne [name]. The patient received appropriate      treatment by emergency department physician, Dr. Ignacia Palma. The rash and      the lip swelling are almost totally resolved at this point.  The patient      has no evidence of oropharyngeal or airway  compromise.   PLAN:  1.  The patient will be admitted to a telemetry bed for further evaluation      and management.  2.  [facility] has been consulted for further evaluation. A      Cardiolite stress test has been scheduled for tomorrow morning.  3.  Will collect cardiac enzymes q.8h. times three.  4.  Will assess the  patient's TSH, fasting lipid panel, and homocysteine      level.  5.  The patient was treated with prednisone, Benadryl, and Pepcid by Dr.      Ignacia Palma in the emergency department. Treatment for urticaria will      continue with Benadryl four times daily to be tapered to once daily,      prednisone Dosepak 10 mg- six day course, and Pepcid 20 mg b.i.d.  6.  Will treat chest pain with sublingual nitroglycerin, as needed.      Prophylactic Lovenox will be administered as well.  7.  Will repeat a 2-view chest x-ray and EKG in the morning.  8.  Prophylactic Protonix.  9.  Pain will be treated with p.r.n. sublingual nitroglycerin,  p.r.n.      Tylenol, and p.r.n. oxycodone.  10. Consider beta blocker if she becomes hypertensive.       DF/MEDQ  D:  07/25/2004  T:  07/25/2004  Job:  [id number]   cc:   Candyce Churn. Allyne [name], M.D.  [address]  [address]  [place]  [place] [address]  Fax: [phone]

## 2010-07-02 NOTE — H&P (Signed)
[facility]  Patient:    Kathryn [name], Kathryn [name] Visit Number: [id number] MRN: [id number]          Service Type: MED Location: ICCU IC09 01 Attending Physician:  Lilyan Punt Dictated by:   Vivia Ewing, D.O. Admit Date:  08/30/2001 Discharge Date: 08/31/2001   CC:         Lilyan Punt, M.D.   History and Physical  CHIEF COMPLAINT:  Chest pain.  BRIEF HISTORY:  The patient is a 70 year old female who presents via the emergency room with a 3 to 4 hours history of on and off atypical chest pain. The patient woke up this morning and had an episode of vomiting following taking new medications, which included ultracet and an antibiotic.  She had a vomiting episode followed by the initiation of chest pain and chest pressure which was substernal and nonradiating.  She continued to vomit 1 or 2 more times.  She eventually made her way to the emergency room where she was evaluated.  She received doses of nitroglycerin which improved her pain.  She had no associated palpitations or dyspnea.  On chest x-ray she was noted to have mild cardiomegaly.  She is admitted to the hospital for further management of her chest pain and to rule out myocardial infarction.  PAST MEDICAL HISTORY:  She has been fairly healthy with a previous episode of chest discomfort which eventually resulted in a catheterization about 1 year ago.  She states that they recommended that they treat her medically at that time.  She has had no history of diabetes, hypertension.  She has had some anxiety for which she has been taking some antianxiety medications on a p.r.n. basis.  SOCIAL HISTORY:  The patient denies tobacco use or alcohol use.  FAMILY HISTORY:  The patient has siblings that have had bypass surgery in their 72s, because of heart disease.  MEDICATIONS: 1. Xanax p.r.n., dose unknown. 2. Ultracet dose unknown. 3. Vibramycin.  ALLERGIES:  No known drug allergies.  REVIEW OF  SYSTEMS:  The patient states that she has had 4 pillow orthopnea for approximately 5 to 10 years which has been progressively worsening.  She denies any episodes of chest pressure or pain prior to this episode.  She has had no URI symptoms.  She was recently seen by Dr. Cathlyn Parsons office and given an antibiotic as well as ultracet for a skin boil.  She has had some chills and sweats earlier today after her chest pain episode.  Her appetite has been good.  She has had no abnormal bowel movements or GI symptoms other than her nausea/vomiting.  PHYSICAL EXAMINATION:  VITAL SIGNS:  Stable.  Blood pressure is 134/65, temperature 98.2, respirations 18, and pulse is 72 to 99.  The patient is in no distress.  She is cooperative, alert and oriented.  She is obese.  HEART: Regular with no murmur.  LUNGS:  Clear.  ABDOMEN:  Obese, soft, and nontender.  EXTREMITIES:  No edema.  She has a nonfocal, nonlateralizing neurologic examination.  NECK:  Soft, nontender with no masses and no thyromegaly.  CHEST X-RAY:  Reportedly shows some cardiomegaly with increased congestion.  EKG:  Shows a normal sinus rhythm with a normal axis.  She has normal R wave progression, normal ST segments and unremarkable T waves.  Normal ECG.  IMPRESSION AND PLAN: 1. Chest pain, rule out myocardial infarction. 2. Possible gastroesophageal reflux disease versus esophagitis versus gastritis. 3. Four pillow orthopnea.  PLAN:  ADmit her to the  hospital to rule out myocardial infarction.  We will place her on an H2 blocker as well as p.r.n. nitroglycerin.  I will order an echocardiogram for tomorrow morning and we will consider having the [facility] see her in consultation as they have seen her in the past.  The overall care plan for the patient has been reviewed with her and she is in agreement. Dictated by:   Vivia Ewing, D.O. Attending Physician:  Lilyan Punt DD:  08/30/01 TD:  09/03/01 Job:  [id number]

## 2010-07-02 NOTE — Op Note (Signed)
NAME:  Kathryn [name], Kathryn [name]                     ACCOUNT NO.:  [id number]   MEDICAL RECORD NO.:  [id number]                   PATIENT TYPE:  AMB   LOCATION:  ENDO                                 FACILITY:  [facility]   PHYSICIAN:  Anselmo Rod, M.D.               DATE OF BIRTH:  14-Jun-1940   DATE OF PROCEDURE:  10/02/2002  DATE OF DISCHARGE:                                 OPERATIVE REPORT   PROCEDURE PERFORMED:  Colonoscopy with cold biopsies times eight and snare  polypectomy times one.   ENDOSCOPIST:  Charna Elizabeth, M.D.   INSTRUMENT USED:  Olympus video colonoscope.   INDICATIONS FOR PROCEDURE:  The patient is a 70 year old African-American  female with a family history of colon cancer in her father and personal  history of rectal bleeding.  Undergoing screening colonoscopy to rule out  colonic polyps, masses, etc.   PREPROCEDURE PREPARATION:  Informed consent was procured from the patient.  The patient was fasted for eight hours prior to the procedure and prepped  with a bottle of magnesium citrate and a gallon of GoLYTELY the night prior  to the procedure.   PREPROCEDURE PHYSICAL:  The patient had stable vital signs.  Neck supple.  Chest clear to auscultation.  S1 and S2 regular.  Abdomen soft with normal  bowel sounds.   DESCRIPTION OF PROCEDURE:  The patient was placed in left lateral decubitus  position and sedated with Demerol and Versed for the  esophagogastroduodenoscopy.  No additional sedation was used for the  colonoscopy.  Once the patient was adequately positioned and maintained on  low flow oxygen and continuous cardiac monitoring, the Olympus video  colonoscope was advanced  from the rectum to the cecum with difficulty.  There was some residual stool in the colon and multiple washes were done.  Small internal hemorrhoids were seen on retroflexion in the rectum.  There  was evidence of a large left-sided diverticula.  A small polyp was snared  from the  rectosigmoid area.  Another two small polyps were biopsied with the  cold biopsy forceps from the rectosigmoid area.  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  This was not ablated as  there was no evidence of bleeding.  Two small polyps were biopsied at 40 cm.  A prominent fold was biopsied at 110 cm.  The appendicular orifice and  ileocecal valve were visualized and photographed.  The patient tolerated the  procedure well without complications.   IMPRESSION:  1. Small internal hemorrhoids.  2. Left-sided diverticulosis.  3. Multiple polyps removed from left colon including rectosigmoid area (see     description above).  4. 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  5. Prominent fold biopsied at 110 cm rule out dysphagia.   RECOMMENDATIONS:  1. Await pathology results.  2. Avoid all nonsteroidals including aspirin for the next two to three     weeks.  3. Outpatient  follow-up in the next two weeks for further recommendations.                                                   Anselmo Rod, M.D.    JNM/MEDQ  D:  10/03/2002  T:  10/03/2002  Job:  [id number]   cc:   Candyce Churn. Allyne Gee, M.D.  [address]  [address]  [place]  [place] [address]  Fax: [phone]

## 2010-07-02 NOTE — Consult Note (Signed)
NAMEJORDYNN, MARCELLA           ACCOUNT NO.:  [id number]   MEDICAL RECORD NO.:  [id number]          PATIENT TYPE:  INP   LOCATION:  1823                         FACILITY:  [facility]   PHYSICIAN:  Creta Levin, M.D. LHCDATE OF BIRTH:  [birth year]   DATE OF CONSULTATION:  07/25/2004  DATE OF DISCHARGE:                                   CONSULTATION   PRIMARY CARE PHYSICIAN:  Dr. Lelon Perla.   The consult is requested by Dr. Sherrie Mustache for chest pain.   HISTORY:  Ms. Sager is a 70 year old woman with a strong family history  for coronary artery disease who was in her usual state of health until this  morning when she began to complain of some lip swelling and chest tightness  that reached 8/10 intensity and lasted for approximately 5 minutes.  She  states that the chest discomfort was associated with nausea and possibly  some dysphagia.  She had no diaphoresis or dyspnea but did admit to  palpitations.  She did not have any dizziness, presyncope, or syncope.   PAST MEDICAL HISTORY:  Asthma.   NO KNOWN DRUG ALLERGIES.   MEDICATIONS:  Albuterol p.r.n., Xanax p.r.n., Tylenol p.r.n.   SOCIAL HISTORY:  She lives in [place] with her husband.  She is  retired from [employer].  She has a minimal distant smoking  history but none since the 1970s or early 66s.  She does not drink alcohol.  She does not use over-the-counter medications, herbals, or illicit drugs.   FAMILY HISTORY:  She had a brother who actually died this week of an MI at  age 3.  Another brother died of an MI at age 29.  Her mother died at age 71  of an MI.  She has 1 sister who had an MI at age 6 and required bypass  surgery.   REVIEW OF SYSTEMS:  Positive for headache, possible urticaria, lip swelling,  chest discomfort, palpitations, and nausea.  The remainder of her 10-point  review is negative.   PHYSICAL EXAM:  Temperature is 98.2, blood pressure is 160/92, heart rate is  78, respiratory  rate 14, saturation 96% on room air.  In general, she is an  obese, pleasant woman in no acute distress.  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  The oropharynx is clear.  NECK:  Difficult to determine her jugular venous pulses, she has normal  carotid upstroke without bruits.  CARDIOVASCULAR EXAM:  Reveals a regular rate and rhythm with a normal S1,  normal S2, a 1:2/6 mid peaking systolic murmur at the right upper sternal  border.  LUNGS:  Clear, no wheezing, no rhonchi, no crackles.  SKIN EXAM:  Revealed no rash or urticaria.  ABDOMINAL EXAM:  Soft, nondistended, nontender with normal bowel sounds.  EXTREMITIES:  She had trace to 1+ edema bilaterally, the femoral pulses were  strong and without bruits, the distal pulses were 2+.   Chest x-ray:  Had some questionable edema but no air space disease.  ECG:  Normal sinus rhythm at a rate of 87, no injury  or ischemia.   LABS:  White count 9.2, hemoglobin 15.6, hematocrit 46, platelets 348.  Sodium 138, potassium 4, chloride 110, bicarbonate 28, BUN 16, creatinine  1.1, glucose 92.  Her MCV is 71.  Cardiac markers have been negative in the  emergency department with the troponin less than .05, the BMP is less than  30.   IMPRESSION:  1.  Noncardiac chest pain though multiple risk factors including age and      family history.  Her blood pressure is a bit elevated today, although      she denies a history of hypertension.  She also denies any      hyperlipidemia, but a lipid panel is pending for the morning.   RECOMMENDATION:  She should continue to be ruled out for myocardial  infarction over the next 24 hours.  If her angioedema issues are resolved  then we would be happy to schedule an exercise Cardiolite with the potential  to convert to pharmacologic stress if necessary.  If she still has some  outstanding issues with respect to the angioedema then this could be done as  an outpatient, assuming  she has ruled out.  I have taken the liberty of  starting empiric Zocor and we will see what her a.m. lipids look like.  I  have also taken the liberty of starting her on 81 mg of aspirin for primary  prevention.  We will continue to see the patient along with you.     __    RPK/MEDQ  D:  07/25/2004  T:  07/25/2004  Job:  [id number]

## 2010-07-02 NOTE — Discharge Summary (Signed)
Kathryn [name], Kathryn [name]           ACCOUNT NO.:  [id number]   MEDICAL RECORD NO.:  [id number]          PATIENT TYPE:  INP   LOCATION:  4714                         FACILITY:  [facility]   PHYSICIAN:  Hettie Holstein, D.O.    DATE OF BIRTH:  [birth year]   DATE OF ADMISSION:  07/25/2004  DATE OF DISCHARGE:  07/27/2004                                 DISCHARGE SUMMARY   PRIMARY CARE PHYSICIAN:  Robyn N. Allyne Gee, M.D.   CARDIOLOGY CONSULTANT THIS ADMISSION:  Pricilla Riffle, M.D., [facility]  Cardiology.   ADMISSION DIAGNOSES:  1.  Chest pain.  2.  Urticaria.   DISCHARGE DIAGNOSES:  1.  Chest pain status post cycling cardiac markers, no evidence of acute      myocardial infarction, status post stress test evaluation by Dr. Tenny Craw of      cardiology without evidence of ischemia.  Ejection fraction noted to be      70%.  She underwent further stratification with lipid panel which      revealed LDL of 98, HDL 38 and homocystine of 5.6.  2.  Urticaria.  Patient had complaints of diffuse rash and hives on her back      and on her thigh that she had had previously this past year, though not      to this degree.  She was evaluated by a dermatologist approximately four      months ago and underwent allergy testing for which she states that there      was no conclusive findings at that time.  In any event, she cannot any      new medications with the exception of a sublingual form of Xanax and      recent travel to [place] with no recollection of new exposures      to food, detergents or soaps.  In any event, the rash subsided with IV      Solu-Medrol and she is currently being tapered.  3.  Candidiasis, cutaneous.  It was noted during this hospitalization in her      intertrigonous and fat folds that she had  findings consistent with a      cutaneous candidiasis.  She was initiated on Mycostatin therapy to      continue in the outpatient setting.   DISCHARGE MEDICATIONS:  1.  Xanax 0.25 mg by  mouth every six hours as needed.  She was instructed to      perhaps refrain from using the sublingual form as this may be a      contributing factor, though this is unclear.  2.  Prednisone taper starting at 40 mg daily to taper over the next week.  3.  Atarax 25 mg one to two tablets every four to six hours as needed.  She      is instructed not to drive while taking this medication.  4.  Mycostatin 100,000 units/g cream two to three times per day and p.r.n.  5.  Albuterol metered dose inhaler as before.   DISPOSITION:  The patient was instructed to follow up with Dr. Allyne Gee in  one to  two weeks for follow-up from hospitalization.  In addition, she was  instructed to return to her dermatologist as needed and call for  appointment.   HISTORY OF PRESENT ILLNESS:  For full details, please refer to the H&P as  dictated by Dr. Elliot Cousin.  However, briefly, Mrs.  Froman is a 70-  year-old lady with recent travel who has history of significant for asthma,  anxiety and recent death in the family and increased stress.  She had  developed some swelling of her lips.  She had recently seen her primary care  physician for swelling of her left eye and was initiated on sublingual  Xanax.  She presented with some atypical chest complaints at the emergency  department and was admitted for further evaluation.   HOSPITAL COURSE:  Patient underwent cycling of her cardiac markers which  were negative for acute ischemia.  She underwent stress test evaluation  without evidence of ischemia and preserved LV function.  She did have a rash  during her hospitalization which appeared to be allergic in nature.  She was  initially on steroids and Benadryl.  This rash resolved promptly.  Otherwise, her hospital course was uneventful.  She exhibited no respiratory  distress during her hospitalization with good oxygenation throughout her  course.  She is being discharged in medically stable condition, to  follow up  with her primary care physician and dermatologist as needed in the  outpatient setting.       ESS/MEDQ  D:  07/27/2004  T:  07/27/2004  Job:  [id number]   cc:   Candyce Churn. Allyne Gee, M.D.  [address]  [address]  [place]  [place] [address]  Fax: [phone]   Pricilla Riffle, M.D.

## 2010-08-02 ENCOUNTER — Other Ambulatory Visit (HOSPITAL_COMMUNITY): Payer: Self-pay | Admitting: Internal Medicine

## 2010-08-02 DIAGNOSIS — Z139 Encounter for screening, unspecified: Secondary | ICD-10-CM

## 2010-08-06 ENCOUNTER — Ambulatory Visit
Admission: RE | Admit: 2010-08-06 | Discharge: 2010-08-06 | Disposition: A | Discharge status: Home / Self Care | Payer: Medicare Other | Source: Ambulatory Visit | Attending: Internal Medicine | Admitting: Internal Medicine

## 2010-08-06 ENCOUNTER — Other Ambulatory Visit: Payer: Self-pay | Admitting: Internal Medicine

## 2010-08-06 DIAGNOSIS — R059 Cough, unspecified: Secondary | ICD-10-CM

## 2010-08-06 DIAGNOSIS — R05 Cough: Secondary | ICD-10-CM

## 2010-08-06 DIAGNOSIS — R0602 Shortness of breath: Secondary | ICD-10-CM

## 2010-08-30 ENCOUNTER — Ambulatory Visit (HOSPITAL_COMMUNITY)
Admission: RE | Admit: 2010-08-30 | Discharge: 2010-08-30 | Disposition: A | Discharge status: Home / Self Care | Payer: Medicare Other | Source: Ambulatory Visit | Attending: Internal Medicine | Admitting: Internal Medicine

## 2010-08-30 DIAGNOSIS — Z139 Encounter for screening, unspecified: Secondary | ICD-10-CM

## 2010-08-30 DIAGNOSIS — Z1231 Encounter for screening mammogram for malignant neoplasm of breast: Secondary | ICD-10-CM | POA: Insufficient documentation

## 2010-10-25 ENCOUNTER — Inpatient Hospital Stay (INDEPENDENT_AMBULATORY_CARE_PROVIDER_SITE_OTHER)
Admission: RE | Admit: 2010-10-25 | Discharge: 2010-10-25 | Disposition: A | Discharge status: Home / Self Care | Payer: Medicare Other | Source: Ambulatory Visit | Attending: Family Medicine | Admitting: Family Medicine

## 2010-10-25 DIAGNOSIS — R0609 Other forms of dyspnea: Secondary | ICD-10-CM

## 2010-10-25 DIAGNOSIS — J309 Allergic rhinitis, unspecified: Secondary | ICD-10-CM

## 2010-10-25 DIAGNOSIS — R062 Wheezing: Secondary | ICD-10-CM

## 2010-11-09 LAB — COMPREHENSIVE METABOLIC PANEL
ALT: 13
AST: 18
Alkaline Phosphatase: 106
CO2: 26
Glucose, Bld: 93
Potassium: 4.6
Sodium: 137
Total Protein: 6.6

## 2010-11-09 LAB — CBC
Hemoglobin: 12
RBC: 5.26 — ABNORMAL HIGH

## 2010-11-09 LAB — DIFFERENTIAL
Basophils Relative: 1
Eosinophils Absolute: 0.2
Eosinophils Relative: 3
Monocytes Relative: 8
Neutrophils Relative %: 56

## 2010-11-19 LAB — CARDIAC PANEL(CRET KIN+CKTOT+MB+TROPI)
CK, MB: 6.8 ng/mL — ABNORMAL HIGH (ref 0.3–4.0)
CK, MB: 7.5 ng/mL — ABNORMAL HIGH (ref 0.3–4.0)
Relative Index: 3.1 — ABNORMAL HIGH (ref 0.0–2.5)
Relative Index: 4.7 — ABNORMAL HIGH (ref 0.0–2.5)
Total CK: 139 U/L (ref 7–177)
Total CK: 155 U/L (ref 7–177)
Troponin I: 0.33 ng/mL — ABNORMAL HIGH (ref 0.00–0.06)
Troponin I: 0.44 ng/mL — ABNORMAL HIGH (ref 0.00–0.06)
Troponin I: 0.64 ng/mL (ref 0.00–0.06)

## 2010-11-19 LAB — BASIC METABOLIC PANEL
BUN: 10 mg/dL (ref 6–23)
BUN: 11 mg/dL (ref 6–23)
Calcium: 8 mg/dL — ABNORMAL LOW (ref 8.4–10.5)
Chloride: 108 mEq/L (ref 96–112)
Creatinine, Ser: 0.77 mg/dL (ref 0.4–1.2)
Creatinine, Ser: 0.87 mg/dL (ref 0.4–1.2)
GFR calc non Af Amer: >60 mL/min (ref >60)
GFR calc non Af Amer: >60 mL/min (ref >60)
Glucose, Bld: 104 mg/dL — ABNORMAL HIGH (ref 70–99)
Glucose, Bld: 109 mg/dL — ABNORMAL HIGH (ref 70–99)
Sodium: 139 mEq/L (ref 135–145)

## 2010-11-19 LAB — CBC
HCT: 33.6 % — ABNORMAL LOW (ref 36.0–46.0)
Hemoglobin: 10.8 g/dL — ABNORMAL LOW (ref 12.0–15.0)
MCHC: 32.1 g/dL (ref 30.0–36.0)
MCV: 72.4 fL — ABNORMAL LOW (ref 78.0–100.0)
Platelets: 305 10*3/uL (ref 150–400)
Platelets: 315 10*3/uL (ref 150–400)
RDW: 15.6 % — ABNORMAL HIGH (ref 11.5–15.5)
WBC: 10 10*3/uL (ref 4.0–10.5)

## 2010-11-19 LAB — GLUCOSE, CAPILLARY
Glucose-Capillary: 61 mg/dL — ABNORMAL LOW (ref 70–99)
Glucose-Capillary: 95 mg/dL (ref 70–99)

## 2010-11-23 ENCOUNTER — Other Ambulatory Visit: Payer: Self-pay | Admitting: Physical Medicine and Rehabilitation

## 2010-11-23 DIAGNOSIS — M4802 Spinal stenosis, cervical region: Secondary | ICD-10-CM

## 2010-11-27 ENCOUNTER — Ambulatory Visit
Admission: RE | Admit: 2010-11-27 | Discharge: 2010-11-27 | Disposition: A | Discharge status: Home / Self Care | Payer: Medicare Other | Source: Ambulatory Visit | Attending: Physical Medicine and Rehabilitation | Admitting: Physical Medicine and Rehabilitation

## 2010-11-27 DIAGNOSIS — M4802 Spinal stenosis, cervical region: Secondary | ICD-10-CM

## 2011-03-03 ENCOUNTER — Other Ambulatory Visit: Payer: Self-pay | Admitting: Orthopedic Surgery

## 2011-03-03 ENCOUNTER — Encounter (HOSPITAL_COMMUNITY): Payer: Self-pay | Admitting: Pharmacy Technician

## 2011-03-08 ENCOUNTER — Encounter (HOSPITAL_COMMUNITY)
Admission: RE | Admit: 2011-03-08 | Discharge: 2011-03-08 | Disposition: A | Discharge status: Home / Self Care | Payer: Medicare Other | Source: Ambulatory Visit | Attending: Orthopedic Surgery | Admitting: Orthopedic Surgery

## 2011-03-08 ENCOUNTER — Encounter (HOSPITAL_COMMUNITY): Payer: Self-pay

## 2011-03-08 HISTORY — DX: Personal history of colonic polyps: Z86.010

## 2011-03-08 HISTORY — DX: Headache: R51

## 2011-03-08 HISTORY — DX: Cervicalgia: M54.2

## 2011-03-08 HISTORY — DX: Essential (primary) hypertension: I10

## 2011-03-08 HISTORY — DX: Personal history of colon polyps, unspecified: Z86.0100

## 2011-03-08 HISTORY — DX: Hyperlipidemia, unspecified: E78.5

## 2011-03-08 HISTORY — DX: Anxiety disorder, unspecified: F41.9

## 2011-03-08 HISTORY — DX: Dorsalgia, unspecified: M54.9

## 2011-03-08 HISTORY — DX: Spontaneous ecchymoses: R23.3

## 2011-03-08 HISTORY — DX: Atherosclerotic heart disease of native coronary artery without angina pectoris: I25.10

## 2011-03-08 HISTORY — DX: Other chronic pain: G89.29

## 2011-03-08 HISTORY — DX: Other skin changes: R23.8

## 2011-03-08 HISTORY — DX: Other seasonal allergic rhinitis: J30.2

## 2011-03-08 LAB — COMPREHENSIVE METABOLIC PANEL
Albumin: 3.7 g/dL (ref 3.5–5.2)
BUN: 8 mg/dL (ref 6–23)
Calcium: 9.7 mg/dL (ref 8.4–10.5)
Creatinine, Ser: 0.93 mg/dL (ref 0.50–1.10)
Potassium: 4.1 mEq/L (ref 3.5–5.1)
Total Protein: 7.5 g/dL (ref 6.0–8.3)

## 2011-03-08 LAB — CBC
HCT: 37.3 % (ref 36.0–46.0)
MCH: 22.8 pg — ABNORMAL LOW (ref 26.0–34.0)
MCHC: 32.2 g/dL (ref 30.0–36.0)
MCV: 70.8 fL — ABNORMAL LOW (ref 78.0–100.0)
RDW: 14.9 % (ref 11.5–15.5)

## 2011-03-08 LAB — DIFFERENTIAL
Basophils Relative: 0 % (ref 0–1)
Eosinophils Relative: 2 % (ref 0–5)
Lymphocytes Relative: 22 % (ref 12–46)
Neutrophils Relative %: 67 % (ref 43–77)

## 2011-03-08 LAB — PROTIME-INR: Prothrombin Time: 14.2 seconds (ref 11.6–15.2)

## 2011-03-08 LAB — URINALYSIS, ROUTINE W REFLEX MICROSCOPIC
Bilirubin Urine: NEGATIVE
Glucose, UA: NEGATIVE mg/dL
Hgb urine dipstick: NEGATIVE
Protein, ur: NEGATIVE mg/dL
Specific Gravity, Urine: 1.012 (ref 1.005–1.030)
Urobilinogen, UA: 0.2 mg/dL (ref 0.0–1.0)

## 2011-03-08 LAB — SURGICAL PCR SCREEN: Staphylococcus aureus: NEGATIVE

## 2011-03-08 LAB — APTT: aPTT: 29 seconds (ref 24–37)

## 2011-03-08 LAB — TYPE AND SCREEN
ABO/RH(D): AB POS
Antibody Screen: NEGATIVE

## 2011-03-08 LAB — URINE MICROSCOPIC-ADD ON

## 2011-03-08 NOTE — Progress Notes (Signed)
Dr.Ganji is cardiologist;last office visit 2wks ago and requested ekg and ov  Echo in epic Stress test done 2wks ago-to request report Heart cath 06/2009-in epic

## 2011-03-08 NOTE — Pre-Procedure Instructions (Signed)
20 YOSSELYN TAX  03/08/2011   Your procedure is scheduled on:  Thurs, Jan 31 @ 0730  Report to [facility] at 0530 AM.  Call this number if you have problems the morning of surgery: [phone]   Remember:   Do not eat food:After Midnight.  May have clear liquids: up to 4 Hours before arrival.  Clear liquids include soda, tea, black coffee, apple or grape juice, broth.  Take these medicines the morning of surgery with A SIP OF WATER: Xanax,Carvedilol,and Zyrtec   Do not wear jewelry, make-up or nail polish.  Do not wear lotions, powders, or perfumes. You may wear deodorant.  Do not shave 48 hours prior to surgery.  Do not bring valuables to the hospital.  Contacts, dentures or bridgework may not be worn into surgery.  Leave suitcase in the car. After surgery it may be brought to your room.  For patients admitted to the hospital, checkout time is 11:00 AM the day of discharge.   Patients discharged the day of surgery will not be allowed to drive home.  Name and phone number of your driver:   Special Instructions: CHG Shower Use Special Wash: 1/2 bottle night before surgery and 1/2 bottle morning of surgery.   Please read over the following fact sheets that you were given: Pain Booklet, Coughing and Deep Breathing, Total Joint Packet and MRSA Information

## 2011-03-16 MED ORDER — CEFAZOLIN SODIUM-DEXTROSE 2-3 GM-% IV SOLR
2.0000 g | INTRAVENOUS | Status: AC
  Administered 2011-03-17: 2 g via INTRAVENOUS
  Filled 2011-03-16: qty 50

## 2011-03-16 MED ORDER — POVIDONE-IODINE 7.5 % EX SOLN
Freq: Once | CUTANEOUS | Status: DC
  Filled 2011-03-16: qty 118

## 2011-03-17 ENCOUNTER — Ambulatory Visit (HOSPITAL_COMMUNITY): Payer: Medicare Other | Admitting: Anesthesiology

## 2011-03-17 ENCOUNTER — Encounter (HOSPITAL_COMMUNITY): Payer: Self-pay | Admitting: Anesthesiology

## 2011-03-17 ENCOUNTER — Ambulatory Visit (HOSPITAL_COMMUNITY): Payer: Medicare Other

## 2011-03-17 ENCOUNTER — Inpatient Hospital Stay (HOSPITAL_COMMUNITY)
Admission: RE | Admit: 2011-03-17 | Discharge: 2011-03-22 | DRG: 473 | Disposition: A | Discharge status: Home / Self Care | Payer: Medicare Other | Source: Ambulatory Visit | Attending: Orthopedic Surgery | Admitting: Orthopedic Surgery

## 2011-03-17 ENCOUNTER — Encounter (HOSPITAL_COMMUNITY)
Admission: RE | Disposition: A | Discharge status: Home / Self Care | Payer: Self-pay | Source: Ambulatory Visit | Attending: Orthopedic Surgery

## 2011-03-17 DIAGNOSIS — I2510 Atherosclerotic heart disease of native coronary artery without angina pectoris: Secondary | ICD-10-CM | POA: Diagnosis present

## 2011-03-17 DIAGNOSIS — I10 Essential (primary) hypertension: Secondary | ICD-10-CM | POA: Diagnosis present

## 2011-03-17 DIAGNOSIS — R1310 Dysphagia, unspecified: Secondary | ICD-10-CM | POA: Diagnosis present

## 2011-03-17 DIAGNOSIS — K5900 Constipation, unspecified: Secondary | ICD-10-CM | POA: Diagnosis not present

## 2011-03-17 DIAGNOSIS — Z01810 Encounter for preprocedural cardiovascular examination: Secondary | ICD-10-CM

## 2011-03-17 DIAGNOSIS — Z01812 Encounter for preprocedural laboratory examination: Secondary | ICD-10-CM

## 2011-03-17 DIAGNOSIS — E785 Hyperlipidemia, unspecified: Secondary | ICD-10-CM | POA: Diagnosis present

## 2011-03-17 DIAGNOSIS — Z7902 Long term (current) use of antithrombotics/antiplatelets: Secondary | ICD-10-CM

## 2011-03-17 DIAGNOSIS — Z79899 Other long term (current) drug therapy: Secondary | ICD-10-CM

## 2011-03-17 DIAGNOSIS — M5000 Cervical disc disorder with myelopathy, unspecified cervical region: Principal | ICD-10-CM | POA: Diagnosis present

## 2011-03-17 DIAGNOSIS — F411 Generalized anxiety disorder: Secondary | ICD-10-CM | POA: Diagnosis present

## 2011-03-17 DIAGNOSIS — G549 Nerve root and plexus disorder, unspecified: Secondary | ICD-10-CM

## 2011-03-17 DIAGNOSIS — G8929 Other chronic pain: Secondary | ICD-10-CM | POA: Diagnosis present

## 2011-03-17 DIAGNOSIS — Z9861 Coronary angioplasty status: Secondary | ICD-10-CM

## 2011-03-17 DIAGNOSIS — M810 Age-related osteoporosis without current pathological fracture: Secondary | ICD-10-CM | POA: Diagnosis present

## 2011-03-17 DIAGNOSIS — E119 Type 2 diabetes mellitus without complications: Secondary | ICD-10-CM | POA: Diagnosis present

## 2011-03-17 LAB — GLUCOSE, CAPILLARY
Glucose-Capillary: 102 mg/dL — ABNORMAL HIGH (ref 70–99)
Glucose-Capillary: 118 mg/dL — ABNORMAL HIGH (ref 70–99)

## 2011-03-17 SURGERY — ANTERIOR CERVICAL DECOMPRESSION/DISCECTOMY FUSION 4 LEVEL/HARDWARE REMOVAL
Anesthesia: General | Site: Neck | Laterality: Bilateral | Wound class: Clean

## 2011-03-17 MED ORDER — SITAGLIPTIN PHOS-METFORMIN HCL 50-500 MG PO TABS: 1.0000 | ORAL_TABLET | Freq: Every day | ORAL | Status: DC

## 2011-03-17 MED ORDER — BUPIVACAINE-EPINEPHRINE 0.25% -1:200000 IJ SOLN
INTRAMUSCULAR | Status: DC | PRN
  Administered 2011-03-17: 1.5 mL

## 2011-03-17 MED ORDER — CALCIUM CARBONATE-VITAMIN D 500-200 MG-UNIT PO TABS
1.0000 | ORAL_TABLET | Freq: Every day | ORAL | Status: DC
  Administered 2011-03-18 – 2011-03-20 (×3): 1 via ORAL
  Filled 2011-03-17 (×6): qty 1

## 2011-03-17 MED ORDER — OXYCODONE-ACETAMINOPHEN 5-325 MG PO TABS: 1.0000 | ORAL_TABLET | ORAL | Status: DC | PRN

## 2011-03-17 MED ORDER — METFORMIN HCL 500 MG PO TABS
500.0000 mg | ORAL_TABLET | Freq: Every day | ORAL | Status: DC
  Administered 2011-03-17 – 2011-03-21 (×4): 500 mg via ORAL
  Filled 2011-03-17 (×6): qty 1

## 2011-03-17 MED ORDER — SODIUM CHLORIDE 0.9 % IV SOLN: 250.0000 mL | INTRAVENOUS | Status: DC

## 2011-03-17 MED ORDER — ROCURONIUM BROMIDE 100 MG/10ML IV SOLN
INTRAVENOUS | Status: DC | PRN
  Administered 2011-03-17: 20 mg via INTRAVENOUS
  Administered 2011-03-17: 50 mg via INTRAVENOUS
  Administered 2011-03-17 (×4): 10 mg via INTRAVENOUS

## 2011-03-17 MED ORDER — LACTATED RINGERS IV SOLN
INTRAVENOUS | Status: DC | PRN
  Administered 2011-03-17: 07:00:00 via INTRAVENOUS

## 2011-03-17 MED ORDER — LORATADINE 10 MG PO TABS
10.0000 mg | ORAL_TABLET | Freq: Every day | ORAL | Status: DC
  Administered 2011-03-17 – 2011-03-22 (×6): 10 mg via ORAL
  Filled 2011-03-17 (×6): qty 1

## 2011-03-17 MED ORDER — MENTHOL 3 MG MT LOZG
1.0000 | LOZENGE | OROMUCOSAL | Status: DC | PRN
  Filled 2011-03-17: qty 9

## 2011-03-17 MED ORDER — DEXAMETHASONE SODIUM PHOSPHATE 4 MG/ML IJ SOLN
INTRAMUSCULAR | Status: DC | PRN
  Administered 2011-03-17: 4 mg via INTRAVENOUS

## 2011-03-17 MED ORDER — LINAGLIPTIN 5 MG PO TABS
5.0000 mg | ORAL_TABLET | Freq: Every day | ORAL | Status: DC
  Administered 2011-03-17 – 2011-03-21 (×4): 5 mg via ORAL
  Filled 2011-03-17 (×6): qty 1

## 2011-03-17 MED ORDER — HYDROMORPHONE HCL PF 1 MG/ML IJ SOLN
0.2500 mg | INTRAMUSCULAR | Status: DC | PRN
  Administered 2011-03-17 (×4): 0.5 mg via INTRAVENOUS

## 2011-03-17 MED ORDER — PHENYLEPHRINE HCL 10 MG/ML IJ SOLN
INTRAMUSCULAR | Status: DC | PRN
  Administered 2011-03-17 (×5): 80 ug via INTRAVENOUS

## 2011-03-17 MED ORDER — ONDANSETRON HCL 4 MG/2ML IJ SOLN
4.0000 mg | Freq: Four times a day (QID) | INTRAMUSCULAR | Status: DC | PRN
  Administered 2011-03-17 – 2011-03-18 (×2): 4 mg via INTRAVENOUS
  Filled 2011-03-17 (×2): qty 2

## 2011-03-17 MED ORDER — ACETAMINOPHEN 325 MG PO TABS: 650.0000 mg | ORAL_TABLET | ORAL | Status: DC | PRN

## 2011-03-17 MED ORDER — ACETAMINOPHEN 650 MG RE SUPP: 650.0000 mg | RECTAL | Status: DC | PRN

## 2011-03-17 MED ORDER — POTASSIUM CHLORIDE IN NACL 20-0.9 MEQ/L-% IV SOLN
INTRAVENOUS | Status: DC
  Administered 2011-03-17 – 2011-03-18 (×2): via INTRAVENOUS
  Filled 2011-03-17 (×5): qty 1000

## 2011-03-17 MED ORDER — DROPERIDOL 2.5 MG/ML IJ SOLN: 0.6250 mg | INTRAMUSCULAR | Status: DC | PRN

## 2011-03-17 MED ORDER — ROSUVASTATIN CALCIUM 20 MG PO TABS
20.0000 mg | ORAL_TABLET | Freq: Every day | ORAL | Status: DC
  Administered 2011-03-17 – 2011-03-20 (×3): 20 mg via ORAL
  Filled 2011-03-17 (×7): qty 1

## 2011-03-17 MED ORDER — ZOLPIDEM TARTRATE 5 MG PO TABS: 5.0000 mg | ORAL_TABLET | Freq: Every evening | ORAL | Status: DC | PRN

## 2011-03-17 MED ORDER — PROMETHAZINE HCL 25 MG/ML IJ SOLN: 12.5000 mg | INTRAMUSCULAR | Status: DC | PRN

## 2011-03-17 MED ORDER — DIAZEPAM 5 MG PO TABS
5.0000 mg | ORAL_TABLET | Freq: Four times a day (QID) | ORAL | Status: DC | PRN
  Administered 2011-03-17 – 2011-03-21 (×9): 5 mg via ORAL
  Filled 2011-03-17 (×10): qty 1

## 2011-03-17 MED ORDER — CARVEDILOL 6.25 MG PO TABS
6.2500 mg | ORAL_TABLET | Freq: Every day | ORAL | Status: DC
  Administered 2011-03-17 – 2011-03-22 (×6): 6.25 mg via ORAL
  Filled 2011-03-17 (×6): qty 1

## 2011-03-17 MED ORDER — PROPOFOL 10 MG/ML IV EMUL
INTRAVENOUS | Status: DC | PRN
  Administered 2011-03-17: 140 mg via INTRAVENOUS

## 2011-03-17 MED ORDER — ONDANSETRON HCL 4 MG/2ML IJ SOLN
INTRAMUSCULAR | Status: DC | PRN
  Administered 2011-03-17: 4 mg via INTRAVENOUS

## 2011-03-17 MED ORDER — HYDROMORPHONE HCL PF 1 MG/ML IJ SOLN
INTRAMUSCULAR | Status: AC
  Filled 2011-03-17: qty 1

## 2011-03-17 MED ORDER — MIDAZOLAM HCL 5 MG/5ML IJ SOLN
INTRAMUSCULAR | Status: DC | PRN
  Administered 2011-03-17: 2 mg via INTRAVENOUS

## 2011-03-17 MED ORDER — THROMBIN 20000 UNITS EX KIT
PACK | CUTANEOUS | Status: DC | PRN
  Administered 2011-03-17: 09:00:00 via TOPICAL

## 2011-03-17 MED ORDER — OLMESARTAN MEDOXOMIL 20 MG PO TABS
20.0000 mg | ORAL_TABLET | Freq: Every day | ORAL | Status: DC
  Administered 2011-03-17 – 2011-03-21 (×5): 20 mg via ORAL
  Filled 2011-03-17 (×6): qty 1

## 2011-03-17 MED ORDER — LACTATED RINGERS IV SOLN
INTRAVENOUS | Status: DC | PRN
  Administered 2011-03-17 (×2): via INTRAVENOUS

## 2011-03-17 MED ORDER — SODIUM CHLORIDE 0.9 % IJ SOLN
3.0000 mL | INTRAMUSCULAR | Status: DC | PRN
  Administered 2011-03-21: 3 mL via INTRAVENOUS

## 2011-03-17 MED ORDER — SODIUM CHLORIDE 0.9 % IJ SOLN
3.0000 mL | Freq: Two times a day (BID) | INTRAMUSCULAR | Status: DC
  Administered 2011-03-19: 3 mL via INTRAVENOUS

## 2011-03-17 MED ORDER — VALSARTAN-HYDROCHLOROTHIAZIDE 160-12.5 MG PO TABS: 1.0000 | ORAL_TABLET | Freq: Every day | ORAL | Status: DC

## 2011-03-17 MED ORDER — PHENOL 1.4 % MT LIQD
1.0000 | OROMUCOSAL | Status: DC | PRN
  Administered 2011-03-18 (×2): 1 via OROMUCOSAL
  Filled 2011-03-17: qty 177

## 2011-03-17 MED ORDER — NEOSTIGMINE METHYLSULFATE 1 MG/ML IJ SOLN
INTRAMUSCULAR | Status: DC | PRN
  Administered 2011-03-17: 3 mg via INTRAVENOUS

## 2011-03-17 MED ORDER — HYDROXYZINE HCL 50 MG PO TABS
50.0000 mg | ORAL_TABLET | ORAL | Status: DC | PRN
  Filled 2011-03-17: qty 1

## 2011-03-17 MED ORDER — GLYCOPYRROLATE 0.2 MG/ML IJ SOLN
INTRAMUSCULAR | Status: DC | PRN
  Administered 2011-03-17: .4 mg via INTRAVENOUS

## 2011-03-17 MED ORDER — ALPRAZOLAM 0.25 MG PO TABS
0.2500 mg | ORAL_TABLET | Freq: Two times a day (BID) | ORAL | Status: DC | PRN
  Administered 2011-03-18 – 2011-03-19 (×3): 0.25 mg via ORAL
  Filled 2011-03-17 (×3): qty 1

## 2011-03-17 MED ORDER — MORPHINE SULFATE 4 MG/ML IJ SOLN
2.0000 mg | INTRAMUSCULAR | Status: DC | PRN
  Administered 2011-03-17 – 2011-03-18 (×3): 2 mg via INTRAVENOUS
  Administered 2011-03-18: 07:00:00 via INTRAVENOUS
  Administered 2011-03-18 (×5): 2 mg via INTRAVENOUS
  Filled 2011-03-17 (×7): qty 1

## 2011-03-17 MED ORDER — FENTANYL CITRATE 0.05 MG/ML IJ SOLN
INTRAMUSCULAR | Status: DC | PRN
  Administered 2011-03-17 (×2): 50 ug via INTRAVENOUS
  Administered 2011-03-17: 100 ug via INTRAVENOUS
  Administered 2011-03-17 (×2): 50 ug via INTRAVENOUS
  Administered 2011-03-17: 100 ug via INTRAVENOUS
  Administered 2011-03-17 (×2): 50 ug via INTRAVENOUS

## 2011-03-17 MED ORDER — HYDROCHLOROTHIAZIDE 12.5 MG PO CAPS
12.5000 mg | ORAL_CAPSULE | Freq: Every day | ORAL | Status: DC
  Administered 2011-03-18: 12.5 mg via ORAL
  Filled 2011-03-17 (×2): qty 1

## 2011-03-17 MED ORDER — CEFAZOLIN SODIUM 1-5 GM-% IV SOLN
1.0000 g | Freq: Three times a day (TID) | INTRAVENOUS | Status: AC
  Administered 2011-03-17 (×2): 1 g via INTRAVENOUS
  Filled 2011-03-17 (×2): qty 50

## 2011-03-17 MED ORDER — INSULIN ASPART 100 UNIT/ML ~~LOC~~ SOLN
0.0000 [IU] | Freq: Three times a day (TID) | SUBCUTANEOUS | Status: DC
  Filled 2011-03-17: qty 3

## 2011-03-17 MED ORDER — SUCCINYLCHOLINE CHLORIDE 20 MG/ML IJ SOLN
INTRAMUSCULAR | Status: DC | PRN
  Administered 2011-03-17: 100 mg via INTRAVENOUS

## 2011-03-17 MED ORDER — HYDROXYZINE HCL 50 MG/ML IM SOLN
50.0000 mg | INTRAMUSCULAR | Status: DC | PRN
  Filled 2011-03-17: qty 1

## 2011-03-17 SURGICAL SUPPLY — 77 items
APL SKNCLS STERI-STRIP NONHPOA (GAUZE/BANDAGES/DRESSINGS) ×1
BENZOIN TINCTURE PRP APPL 2/3 (GAUZE/BANDAGES/DRESSINGS) ×2 IMPLANT
BIT DRILL NEURO 2X3.1 SFT TUCH (MISCELLANEOUS) ×1 IMPLANT
BLADE LONG MED 31X9 (MISCELLANEOUS) IMPLANT
BLADE SURG 15 STRL LF DISP TIS (BLADE) ×1 IMPLANT
BLADE SURG 15 STRL SS (BLADE) ×2
BLADE SURG ROTATE 9660 (MISCELLANEOUS) ×1 IMPLANT
BUR MATCHSTICK NEURO 3.0 LAGG (BURR) ×3 IMPLANT
CARTRIDGE OIL MAESTRO DRILL (MISCELLANEOUS) ×1 IMPLANT
CLOSURE STERI STRIP 1/2 X4 (GAUZE/BANDAGES/DRESSINGS) ×1 IMPLANT
CLOTH BEACON ORANGE TIMEOUT ST (SAFETY) ×2 IMPLANT
COLLAR CERV LO CONTOUR FIRM DE (SOFTGOODS) IMPLANT
CORDS BIPOLAR (ELECTRODE) ×2 IMPLANT
COVER MAYO STAND STRL (DRAPES) ×2 IMPLANT
COVER SURGICAL LIGHT HANDLE (MISCELLANEOUS) ×2 IMPLANT
CRADLE DONUT ADULT HEAD (MISCELLANEOUS) ×2 IMPLANT
DEVICE ENDSKLTN IMPLNT MED 6MM (Orthopedic Implant) IMPLANT
DIFFUSER DRILL AIR PNEUMATIC (MISCELLANEOUS) ×2 IMPLANT
DRAIN JACKSON RD 7FR 3/32 (WOUND CARE) ×2 IMPLANT
DRAPE C-ARM 42X72 X-RAY (DRAPES) ×2 IMPLANT
DRAPE POUCH INSTRU U-SHP 10X18 (DRAPES) ×2 IMPLANT
DRAPE SURG 17X23 STRL (DRAPES) ×6 IMPLANT
DRILL NEURO 2X3.1 SOFT TOUCH (MISCELLANEOUS) ×2
DURAPREP 6ML APPLICATOR 50/CS (WOUND CARE) ×2 IMPLANT
ELECT COATED BLADE 2.86 ST (ELECTRODE) ×2 IMPLANT
ELECT REM PT RETURN 9FT ADLT (ELECTROSURGICAL) ×2
ELECTRODE REM PT RTRN 9FT ADLT (ELECTROSURGICAL) ×1 IMPLANT
ENDOSKELETON IMPLANT MED 6MM (Orthopedic Implant) ×8 IMPLANT
EVACUATOR SILICONE 100CC (DRAIN) ×2 IMPLANT
GAUZE SPONGE 4X4 16PLY XRAY LF (GAUZE/BANDAGES/DRESSINGS) ×2 IMPLANT
GLOVE BIO SURGEON STRL SZ8 (GLOVE) ×3 IMPLANT
GLOVE BIOGEL PI IND STRL 8 (GLOVE) ×1 IMPLANT
GLOVE BIOGEL PI INDICATOR 8 (GLOVE) ×1
GOWN PREVENTION PLUS XLARGE (GOWN DISPOSABLE) ×4 IMPLANT
GOWN STRL NON-REIN LRG LVL3 (GOWN DISPOSABLE) ×2 IMPLANT
IV CATH 14GX2 1/4 (CATHETERS) ×2 IMPLANT
KIT BASIN OR (CUSTOM PROCEDURE TRAY) ×2 IMPLANT
KIT ROOM TURNOVER OR (KITS) ×2 IMPLANT
MANIFOLD NEPTUNE II (INSTRUMENTS) ×2 IMPLANT
NDL SPNL 20GX3.5 QUINCKE YW (NEEDLE) ×1 IMPLANT
NEEDLE 27GAX1X1/2 (NEEDLE) ×2 IMPLANT
NEEDLE SPNL 20GX3.5 QUINCKE YW (NEEDLE) ×2 IMPLANT
NS IRRIG 1000ML POUR BTL (IV SOLUTION) ×2 IMPLANT
OIL CARTRIDGE MAESTRO DRILL (MISCELLANEOUS) ×2
PACK ORTHO CERVICAL (CUSTOM PROCEDURE TRAY) ×2 IMPLANT
PAD ARMBOARD 7.5X6 YLW CONV (MISCELLANEOUS) ×4 IMPLANT
PATTIES SURGICAL .5 X.5 (GAUZE/BANDAGES/DRESSINGS) IMPLANT
PATTIES SURGICAL .5 X1 (DISPOSABLE) ×1 IMPLANT
PIN RETAINER PRODISC 14 MM (PIN) ×2 IMPLANT
PLATE VECTRA 4 LEVEL (Plate) ×1 IMPLANT
PUTTY BONE DBX 5CC MIX (Putty) ×1 IMPLANT
RETAINER SCREW 14MM ×2 IMPLANT
SCREW 4.0X16MM (Screw) ×10 IMPLANT
SPONGE GAUZE 4X4 12PLY (GAUZE/BANDAGES/DRESSINGS) ×2 IMPLANT
SPONGE GAUZE 4X4 STERILE 39 (GAUZE/BANDAGES/DRESSINGS) ×1 IMPLANT
SPONGE INTESTINAL PEANUT (DISPOSABLE) ×4 IMPLANT
SPONGE SURGIFOAM ABS GEL 100 (HEMOSTASIS) ×1 IMPLANT
STRIP CLOSURE SKIN 1/2X4 (GAUZE/BANDAGES/DRESSINGS) ×2 IMPLANT
SURGIFLO TRUKIT (HEMOSTASIS) ×1 IMPLANT
SUT ETHILON 3 0 FSL (SUTURE) IMPLANT
SUT MNCRL AB 4-0 PS2 18 (SUTURE) IMPLANT
SUT PROLENE 3 0 PS 2 (SUTURE) IMPLANT
SUT SILK 2 0 (SUTURE) ×2
SUT SILK 2-0 18XBRD TIE 12 (SUTURE) IMPLANT
SUT SILK 4 0 (SUTURE)
SUT SILK 4-0 18XBRD TIE 12 (SUTURE) IMPLANT
SUT VIC AB 2-0 CT2 18 VCP726D (SUTURE) ×2 IMPLANT
SYR BULB IRRIGATION 50ML (SYRINGE) ×2 IMPLANT
SYR CONTROL 10ML LL (SYRINGE) ×2 IMPLANT
TAPE CLOTH 4X10 WHT NS (GAUZE/BANDAGES/DRESSINGS) ×2 IMPLANT
TAPE CLOTH SURG 4X10 WHT LF (GAUZE/BANDAGES/DRESSINGS) ×1 IMPLANT
TAPE UMBILICAL COTTON 1/8X30 (MISCELLANEOUS) ×4 IMPLANT
TOWEL OR 17X24 6PK STRL BLUE (TOWEL DISPOSABLE) ×2 IMPLANT
TOWEL OR 17X26 10 PK STRL BLUE (TOWEL DISPOSABLE) ×2 IMPLANT
TRAY FOLEY CATH 14FR (SET/KITS/TRAYS/PACK) ×2 IMPLANT
WATER STERILE IRR 1000ML POUR (IV SOLUTION) ×1 IMPLANT
YANKAUER SUCT BULB TIP NO VENT (SUCTIONS) ×2 IMPLANT

## 2011-03-17 NOTE — Transfer of Care (Signed)
Immediate Anesthesia Transfer of Care Note  Patient: Kathryn [name]  Procedure(s) Performed:  ANTERIOR CERVICAL DECOMPRESSION/DISCECTOMY FUSION 4 LEVEL/HARDWARE REMOVAL - Anterior cervical decompression fusion, cervical 3-4, cervical 4-5, cervical 5-6, cervical 6-7 with instrumentation and allograft.  Patient Location: PACU  Anesthesia Type: General  Level of Consciousness: awake and alert   Airway & Oxygen Therapy: Patient Spontanous Breathing and Patient connected to nasal cannula oxygen  Post-op Assessment: Report given to PACU RN  Post vital signs: Reviewed and stable  Complications: No apparent anesthesia complications

## 2011-03-17 NOTE — Anesthesia Postprocedure Evaluation (Signed)
Anesthesia Post Note  Patient: Kathryn [name]  Procedure(s) Performed:  ANTERIOR CERVICAL DECOMPRESSION/DISCECTOMY FUSION 4 LEVEL/HARDWARE REMOVAL - Anterior cervical decompression fusion, cervical 3-4, cervical 4-5, cervical 5-6, cervical 6-7 with instrumentation and allograft.  Anesthesia type: general  Patient location: PACU  Post pain: Pain level controlled  Post assessment: Patient's Cardiovascular Status Stable  Last Vitals:  Filed Vitals:   03/17/11 1407  BP: 155/55  Pulse: 84  Temp:   Resp: 17    Post vital signs: Reviewed and stable  Level of consciousness: sedated  Complications: No apparent anesthesia complications

## 2011-03-17 NOTE — Progress Notes (Signed)
Dr. Yevette Edwards at bedside, talked to pt.

## 2011-03-17 NOTE — H&P (Signed)
PREOPERATIVE H&P  Chief Complaint: bilain balanceteral arm pain and derterioratio   HPI: Kathryn [name] is a 71 y.o. female who presents with symptoms noted above  Past Medical History  Diagnosis Date  . Dysphagia   . Hypertension     takes Coreg and Diovan daily  . Hyperlipidemia     takes Crestor daily  . Coronary artery disease     1 stent   . Seasonal allergies     takes Zyrtec nightly  . Headache     related to cervical issues  . Chronic neck pain   . Chronic back pain     buldging disc  . Osteoporosis     was taking shot 2xyr;but not taking anymore  . Bruises easily     pt is on Plavix  . Diverticulosis   . History of colonic polyps   . Diabetes mellitus     takes Janumet daily;pt states she is "prediabetic'  . Anxiety     takes Xanax bid prn   Past Surgical History  Procedure Date  . Tonsillectomy     at age 39  . Abdominal hysterectomy 70's  . Cholecystectomy 2009  . Cardiac catheterization 2009/2011    1 stent placed  . Bunionectomy     left foot  . Colonoscopy   . Esophagogastroduodenoscopy    History   Social History  . Marital Status: Married    Spouse Name: N/A    Number of Children: N/A  . Years of Education: N/A   Social History Main Topics  . Smoking status: Never Smoker   . Smokeless tobacco: Not on file  . Alcohol Use: No  . Drug Use: No  . Sexually Active: Yes   Other Topics Concern  . Not on file   Social History Narrative  . No narrative on file   Family History  Problem Relation Age of Onset  . Anesthesia problems Neg Hx   . Hypotension Neg Hx   . Malignant hyperthermia Neg Hx   . Pseudochol deficiency Neg Hx    No Known Allergies Prior to Admission medications   Medication Sig Start Date End Date Taking? Authorizing Provider  ALPRAZolam (XANAX) 0.25 MG tablet Take 0.25 mg by mouth 2 (two) times daily as needed. For anxiety   Yes Historical Provider, MD  calcium-vitamin D (OSCAL WITH D) 500-200 MG-UNIT per  tablet Take 1 tablet by mouth daily.   Yes Historical Provider, MD  carvedilol (COREG) 6.25 MG tablet Take 6.25 mg by mouth daily.   Yes Historical Provider, MD  cetirizine (ZYRTEC) 10 MG tablet Take 10 mg by mouth at bedtime.   Yes Historical Provider, MD  clopidogrel (PLAVIX) 75 MG tablet Take 75 mg by mouth daily.   Yes Historical Provider, MD  rosuvastatin (CRESTOR) 20 MG tablet Take 20 mg by mouth at bedtime.   Yes Historical Provider, MD  sitaGLIPtan-metformin (JANUMET) 50-500 MG per tablet Take 1 tablet by mouth at bedtime.   Yes Historical Provider, MD  valsartan-hydrochlorothiazide (DIOVAN-HCT) 160-12.5 MG per tablet Take 1 tablet by mouth at bedtime.   Yes Historical Provider, MD     All other systems have been reviewed and were otherwise negative with the exception of those mentioned in the HPI and as above.  Physical Exam: There were no vitals filed for this visit.  General: Alert, no acute distress Cardiovascular: No pedal edema Respiratory: No cyanosis, no use of accessory musculature GI: No organomegaly, abdomen is soft and non-tender Skin:  No lesions in the area of chief complaint Neurologic: Sensation intact distally Psychiatric: Patient is competent for consent with normal mood and affect Lymphatic: No axillary or cervical lymphadenopathy  MUSCULOSKELETAL: 4/5 strength UEs, + hoffmans'  Assessment/Plan: Myleoradiculopathy Plan for Procedure(s): ANTERIOR CERVICAL DECOMPRESSION/DISCECTOMY FUSION 4 LEVELS   Emilee Hero, MD 03/17/2011 6:21 AM

## 2011-03-17 NOTE — Anesthesia Preprocedure Evaluation (Signed)
Anesthesia Evaluation  Patient identified by MRN, date of birth, ID band Patient awake    Reviewed: Allergy & Precautions, H&P , NPO status , Patient's Chart, lab work & pertinent test results  History of Anesthesia Complications Negative for: history of anesthetic complications  Airway Mallampati: III TM Distance: >3 FB Neck ROM: Limited  Mouth opening: Limited Mouth Opening  Dental  (+) Teeth Intact and Dental Advisory Given   Pulmonary neg pulmonary ROS,  clear to auscultation  Pulmonary exam normal       Cardiovascular hypertension, Pt. on home beta blockers and Pt. on medications + CAD and + Cardiac Stents Regular Normal    Neuro/Psych  Headaches, Anxiety    GI/Hepatic negative GI ROS, Neg liver ROS,   Endo/Other  Diabetes mellitus-, Type 2, Oral Hypoglycemic AgentsMorbid obesity  Renal/GU negative Renal ROS     Musculoskeletal   Abdominal   Peds  Hematology   Anesthesia Other Findings   Reproductive/Obstetrics                           Anesthesia Physical Anesthesia Plan  ASA: III  Anesthesia Plan: General   Post-op Pain Management:    Induction: Intravenous  Airway Management Planned: Oral ETT  Additional Equipment:   Intra-op Plan:   Post-operative Plan: Extubation in OR  Informed Consent: I have reviewed the patients History and Physical, chart, labs and discussed the procedure including the risks, benefits and alternatives for the proposed anesthesia with the patient or authorized representative who has indicated his/her understanding and acceptance.   Dental advisory given  Plan Discussed with: CRNA and Anesthesiologist  Anesthesia Plan Comments:         Anesthesia Quick Evaluation

## 2011-03-17 NOTE — Preoperative (Signed)
Beta Blockers   Reason not to administer Beta Blockers:Not Applicable 

## 2011-03-17 NOTE — Progress Notes (Signed)
Pt is s/p ACDF C3-4, C4-5, C5-6, C6-7. Pt has a dry dressing to the anterior neck. Aspen in place. Pt repts that preop she had R arm pain, weakness and numbness. Neuro check is negative. Pt is lethargic but arousable. Pt can tilt self. Pt lungs CTA but noted to be diminished in the bases. Pt sats 97% 2lpm. Heart rate regular rate and rhythm. No s/sx cardiac or resp distress and no c/o such. Pt given IS with verbal instruction. Pt verb understanding but pt is lethargic and will need further instruction. Abdomen soft flat nontender and nondistended. BS faint x 4. Pt c/o nausea on admit to floor that was relieved quickly with smelling of alcohol. Pt repts LBM was this AM. Family at bedside and supportive.

## 2011-03-18 LAB — GLUCOSE, CAPILLARY
Glucose-Capillary: 102 mg/dL — ABNORMAL HIGH (ref 70–99)
Glucose-Capillary: 92 mg/dL (ref 70–99)

## 2011-03-18 MED ORDER — HYDROCHLOROTHIAZIDE 10 MG/ML ORAL SUSPENSION
12.5000 mg | Freq: Every day | ORAL | Status: DC
  Administered 2011-03-18: 12.5 mg via ORAL
  Administered 2011-03-19 – 2011-03-21 (×3): 13 mg via ORAL
  Filled 2011-03-18 (×5): qty 1.88

## 2011-03-18 MED ORDER — HYDROCODONE-ACETAMINOPHEN 5-325 MG PO TABS
1.0000 | ORAL_TABLET | ORAL | Status: DC | PRN
  Administered 2011-03-18: 1 via ORAL
  Administered 2011-03-18 – 2011-03-22 (×18): 2 via ORAL
  Filled 2011-03-18: qty 2
  Filled 2011-03-18: qty 1
  Filled 2011-03-18 (×18): qty 2

## 2011-03-18 NOTE — Progress Notes (Signed)
UR COMPLETED  

## 2011-03-18 NOTE — Progress Notes (Signed)
Pt doing well, aspen in place.  Resolved right arm pain. + throat soreness, difficulty swallowing  NVI Aspen in place Dressing CDI  POD #1 after C3-7 acdf, doing well  - philly collar to bedside - cepacol spray - will follow up with nurse later today, d/chome if swallowing improves

## 2011-03-18 NOTE — Progress Notes (Signed)
Pt is s/p ACDF C3-4, C4-5, C5-6, C6-7. Pt repts preop she had R arm pain with weakness and numbness. Neuro check is negative. Pt has aspen collar in place with dry hypofix dressing to anterior neck. Pt repts that those s/sx are "gone" postop. Pt now c/o throat irritation and back and neck discomfort that she describes as being "heavy". Pt repts some "problems swallowing" due to pain of throat. Pt repts that postop nausea that she had yesterday is "gone" now. Pt tolerates clear liquid diet fair. Pt BS faint x 4. Pt denies passing gas. Abdomen soft flat nontender and nondistended. Pt repts LBM 1/31. Pt lungs CTA but diminished in the bases. Heart rate regular rate and rhythm. No s/sx cardiac distress or resp distress and no c/o such. Vital signs stable. Pt performs IS per order. Pt is to void since foley d/ced this AM. Pt has no pressure skin issues of heels or sacral area. Turning pt every two hours and floating heels.

## 2011-03-18 NOTE — Op Note (Signed)
Kathryn [name], MEDICI NO.:  [id number]  MEDICAL RECORD NO.:  [id number]  LOCATION:  5039                         FACILITY:  [facility]  PHYSICIAN:  Estill Bamberg, MD      DATE OF BIRTH:  03/22/1940  DATE OF PROCEDURE:  03/17/2011 DATE OF DISCHARGE:                              OPERATIVE REPORT   PREOPERATIVE DIAGNOSES: 1. Myeloradiculopathy. 2. C3-4, C4-5, C5-6, C6-7 severe degenerative disk disease.  POSTOPERATIVE DIAGNOSES: 1. Myeloradiculopathy. 2. C3-4, C4-5, C5-6, C6-7 severe degenerative disk disease.  PROCEDURE: 1. Anterior cervical decompression and fusion C3-4, C4-5, C5-6, C6-7. 2. Insertion of interbody device x4 (6 mm lordotic interbody Titan     cage). 3. Placement of anterior instrumentation (Synthes Vectra anterior     cervical plate). 4. Use of local autograft. 5. Use of morselized allograft (DBX mixed). 6. Intraoperative use of fluoroscopy.  SURGEON:  Estill Bamberg, MD  ASSISTANT:  Skip Mayer, PA  ANESTHESIA:  General endotracheal anesthesia.  COMPLICATIONS:  None.  DISPOSITION:  Stable.  ESTIMATED BLOOD LOSS:  Minimal.  INDICATIONS FOR PROCEDURE:  Briefly, Ms. Malanowski is an extremely pleasant 71 year old female, who initially presented to me on February 21, 2011, with severe pain in the right arm.  In addition, a deterioration in her balance.  Given the patient's worsened symptoms, I did then review an MRI of the patient's cervical spine dated November 27, 2010, which was notable for right-sided foraminal stenosis to varying degrees at C3-4, C4-5, C5-6, C6-7.  There was also noted to be spinal cord compression at each intervertebral level, mainly involving the C3-4 and C4-5 levels.  Given the patient's symptoms and physical exam findings consistent with myelopathy, in addition to her severe right-sided cervical radiculopathy, we did have a discussion regarding going forward with a four-level anterior cervical decompression and  fusion.  The patient did fully understand the risks and limitations of the procedure as outlined in my preoperative note.  OPERATIVE DETAILS:  On March 17, 2011, the patient was brought to surgery and general endotracheal anesthesia was administered.  The patient was placed supine on a well-padded hospital bed.  The arms were secured to the patient's sides.  Of note, the area of the ulnar nerve at the medial elbow was padded liberally.  The patient's shoulders were taped to the inferior aspect of the bed.  SCDs were placed and antibiotics were given.  I then brought in a lateral fluoroscopic view to help optimize location of the incision.  The neck was then prepped and draped in usual sterile fashion and a time-out procedure was performed.  I then made an oblique incision over the medial border of the sternocleidomastoid muscle on the patient's left side.  The platysma was readily identified and taken and taken down using electrocautery. The plane between the sternocleidomastoid muscle laterally and strap muscles medially was identified and explored.  The anterior cervical spine was readily noted.  I then brought in a lateral fluoroscopic view to confirm the appropriate operative levels.  I then subperiosteally exposed the vertebral bodies of C3, C4, C5, C6 and C7.  I then placed a self-retaining Shadow-Line retractor centered over the C6-7 interspace. Of note, there were extensive in abundant osteophytes  noted anteriorly and these were taken down using a rongeur.  I then used a 15 blade knife to perform an annulotomy.  Of note, getting into the intervertebral space was extremely difficult, given the severe height loss and the severe degenerative disk disease.  I was ultimately able to gain access to the posterior anulus.  Of note, Caspar pins were placed into the C6 and C7 vertebral bodies and distraction was applied across the interspace.  I then took down the posterior longitudinal  ligament using a 4-0 curette.  I then used #1, followed by #2 Kerrison to remove the posterior longitudinal ligament from the left to the right uncovertebral joint and I did go forward with a thorough neural foraminal decompression on the right side.  I then prepared the endplates using a rasp and I did feel the 6 mm lordotic medium Titan interbody trial was the most appropriate fit.  I then selected the appropriately sized implant and this was packed with autograft that obtained for removing the osteophytes anteriorly in addition to DBX mix.  This was tamped into position in the usual fashion.  I was very happy with the final press fit.  At this point, I turned my attention towards the C5-6 interspace. A Caspar pin was removed from the C7 vertebral body.  A diskectomy was performed in the manner described previously.  Again, I was able to remove osteophytes posteriorly in addition to performing a thorough neural foraminal decompression on the right side.  Again, medium implant was packed with autograft and allograft and tamped into position.  The same size was used.  A similar diskectomy was performed at the C4-5 and then the C3-4 level in the manner previously described.  A 6 mm medium lordotic implants were placed at each intervertebral level and I was very happy with the final press fit.  I did use AP and lateral fluoroscopy to confirm appropriate positioning of the implants.  I then chose an appropriately sized Synthes Vectra plate.  I did place a total of 10 self-drilling, self-tapping, 16 mm screws, 2 in each vertebral body.  I was very happy with the final press fit and purchase of each of the screws.  The plate was contoured into the appropriate amount of lordosis prior to placing the plate.  Again, AP and lateral fluoroscopy was utilized well placing the anterior cervical plate.  I was very happy with the final appearance on the fluoroscopic views.  At this point, the wound was  copiously irrigated.  The wound was explored for any undue bleeding and all bleeding was controlled using bipolar electrocautery. All instrument counts were correct at the termination of the procedure. I then closed the platysma using 2-0 Vicryl, and the skin was closed using 3-0 Monocryl.  Benzoin and Steri-Strips were applied over the wound and an Aspen cervical collar was then placed.  Of note, Skip Mayer was my assistant throughout the procedure and aided in essential retraction and suctioning required throughout the surgery.     Estill Bamberg, MD    MD/MEDQ  D:  03/17/2011  T:  03/18/2011  Job:  [id number]  cc:   [name]

## 2011-03-19 LAB — GLUCOSE, CAPILLARY: Glucose-Capillary: 102 mg/dL — ABNORMAL HIGH (ref 70–99)

## 2011-03-19 MED ORDER — PANTOPRAZOLE SODIUM 40 MG PO TBEC
40.0000 mg | DELAYED_RELEASE_TABLET | Freq: Every day | ORAL | Status: DC
  Administered 2011-03-19 – 2011-03-22 (×4): 40 mg via ORAL
  Filled 2011-03-19 (×4): qty 1

## 2011-03-19 MED ORDER — ALPRAZOLAM 0.5 MG PO TABS
0.5000 mg | ORAL_TABLET | Freq: Three times a day (TID) | ORAL | Status: DC | PRN
  Administered 2011-03-20 (×2): 0.5 mg via ORAL
  Filled 2011-03-19 (×3): qty 1

## 2011-03-19 MED ORDER — POTASSIUM CHLORIDE IN NACL 20-0.9 MEQ/L-% IV SOLN
INTRAVENOUS | Status: DC
  Administered 2011-03-20 – 2011-03-21 (×2): via INTRAVENOUS
  Filled 2011-03-19 (×5): qty 1000

## 2011-03-19 NOTE — Progress Notes (Addendum)
Pt and family expressed concern about her difficulty swallowing. When pt swallows liquid she starts burping up air and at times spits out liquid that she swallowed. Pt is anxious and c/o pain in her posterior neck. Neck brace temporarily removed in front to apply ice pack and pt expressed immense relief when this was done. Neck brace reapplied with fewer complaints from pt. IV team nurse restarted IV so morphine and antiemetic could be administered. Pt much more comfortable later in shift.

## 2011-03-19 NOTE — Progress Notes (Addendum)
Awake, alert, oriented x 3.  Complains of continued difficulty swallowing.  Taking PO liquids only, but not easily.  Arm pain has improved since surgery.  Hx of hiatal hernia, but not on med for reflux.  No palpable swelling in neck.  Dressing dry.  Strength intact in upper extremities.    A: s/p C3-C7 ACDF with difficulty swallowing  P: Protonix, increase xanax for anxiety.  May proceed with swallow study - will discuss with Dr. Yevette Edwards.  Continue liquids, increase IV fluids to 50 cc/hr.   ADDENDUM:  Dr.  Turner Daniels and I spoke with Dr. Yevette Edwards.  He would like to keep the patient on clear liquids for now with observation and hold off on a swallow study.

## 2011-03-20 LAB — GLUCOSE, CAPILLARY
Glucose-Capillary: 112 mg/dL — ABNORMAL HIGH (ref 70–99)
Glucose-Capillary: 96 mg/dL (ref 70–99)

## 2011-03-20 MED ORDER — BISACODYL 10 MG RE SUPP
10.0000 mg | Freq: Every day | RECTAL | Status: DC | PRN
  Filled 2011-03-20: qty 1

## 2011-03-20 MED ORDER — FLEET ENEMA 7-19 GM/118ML RE ENEM: 1.0000 | ENEMA | Freq: Every day | RECTAL | Status: DC | PRN

## 2011-03-20 NOTE — Progress Notes (Signed)
Pt continues to have difficulty swallowing. Pt's throat makes gurgling sound when drinking water. Pt describes PO intake of any consistency as "stopping and won't go down" when swallowing and feels that she has to move and maneuver her upper body to assist the bolus down. She does not feel as though she is aspirating. She does not cough afterwards. She does better with meds broken in half in a small amount of applesauce, but still has the issue. If she drinks water, it gurgles and she has to "spit up" the water, but is not having nausea or vomiting of stomach contents. RN encourages pt to stand up to take meds for increased abdominal expansion. Seems to help some. Family concerned and asked for possible swallow study. Dr Yevette Edwards made aware of this issue yesterday and declines swallow study at this time. Will continue to monitor and reassure pt and family.

## 2011-03-20 NOTE — Progress Notes (Signed)
Awake, alert, oriented x 3.  Patient reports improving neck pain and no radicular symptoms in upper extremities.  No bowel movement yet.  Patient still unable to swallow liquids, but she has been able to swallow pudding and applesauce this morning.  O: Dressing dry.  Collar intact.  Strength intact in upper extremities.  Positive bowel sounds.    A:  S/p C3-C7 ACDF with difficulty swallowing  P: Dulcolax suppository for constipation.  Spoke with Dr. Yevette Edwards.  If the patient is able to tolerate Ensure by mouth today, we can continue to observe.  Otherwise, we may proceed with NG tube for nutrition.

## 2011-03-21 LAB — GLUCOSE, CAPILLARY
Glucose-Capillary: 88 mg/dL (ref 70–99)
Glucose-Capillary: 110 mg/dL — ABNORMAL HIGH (ref 70–99)

## 2011-03-21 NOTE — Progress Notes (Signed)
Occupational Therapy Evaluation Patient Details Name: Kathryn [name] MRN: [id number] DOB: 06/12/1940 Today's Date: 03/21/2011  Problem List: There is no problem list on file for this patient.   Past Medical History:  Past Medical History  Diagnosis Date  . Dysphagia   . Hypertension     takes Coreg and Diovan daily  . Hyperlipidemia     takes Crestor daily  . Coronary artery disease     1 stent   . Seasonal allergies     takes Zyrtec nightly  . Headache     related to cervical issues  . Chronic neck pain   . Chronic back pain     buldging disc  . Osteoporosis     was taking shot 2xyr;but not taking anymore  . Bruises easily     pt is on Plavix  . Diverticulosis   . History of colonic polyps   . Diabetes mellitus     takes Janumet daily;pt states she is "prediabetic'  . Anxiety     takes Xanax bid prn   Past Surgical History:  Past Surgical History  Procedure Date  . Tonsillectomy     at age 39  . Abdominal hysterectomy 70's  . Cholecystectomy 2009  . Cardiac catheterization 2009/2011    1 stent placed  . Bunionectomy     left foot  . Colonoscopy   . Esophagogastroduodenoscopy     OT Assessment/Plan/Recommendation OT Assessment Clinical Impression Statement: 71 yo female s/p ACDF. All education regarding ADL following cervical precautions completed. pt/family given handout, pt/family verbalized understanding. Pt limited during eval by "vomiting" secondary to difficulty swallowing. Rec to nursing to keep ice on surgical area to decrease edema. Nsg aware. no further OT needed. thanks for referral. OT Recommendation/Assessment: Patient does not need any further OT services OT Recommendation Follow Up Recommendations: No OT follow up Equipment Recommended: None recommended by OT OT Goals Acute Rehab OT Goals OT Goal Formulation:  (eval only)  OT Evaluation Precautions/Restrictions  Precautions Precautions:  (neck) Precaution Comments: ACDF Required  Braces or Orthoses: Yes Restrictions Weight Bearing Restrictions: No Prior Functioning Home Living Lives With: Spouse Type of Home: House Home Layout: Two level;Full bath on main level;Able to live on main level with bedroom/bathroom Home Access: Stairs to enter Entrance Stairs-Rails: None Entrance Stairs-Number of Steps: 2 Bathroom Shower/Tub: Walk-in shower;Door [employer] Toilet: Standard Bathroom Accessibility: Yes How Accessible: Accessible via walker Home Adaptive Equipment: Built-in shower seat Prior Function Level of Independence: Independent with basic ADLs;Independent with homemaking with ambulation;Independent with transfers;Independent with gait Able to Take Stairs?: Yes Driving: Yes Vocation: Retired ADL ADL Eating/Feeding:  (difficulty with swallowing s/p surgery) Where Assessed - Eating/Feeding: Edge of bed Grooming: Minimal assistance Where Assessed - Grooming: Sitting, bed Upper Body Bathing: Minimal assistance;Simulated Where Assessed - Upper Body Bathing: Sitting, bed Lower Body Bathing: Minimal assistance Where Assessed - Lower Body Bathing: Supine, head of bed up Upper Body Dressing: Simulated;Minimal assistance Upper Body Dressing Details (indicate cue type and reason):  (educated on proper dressing technique to decrease neck strai) Where Assessed - Upper Body Dressing: Supine, head of bed up Lower Body Dressing: Simulated;Minimal assistance Where Assessed - Lower Body Dressing: Sit to stand from bed Toilet Transfer: Simulated;Supervision/safety Toileting - Clothing Manipulation: Independent Toileting - Hygiene: Independent Tub/Shower Transfer: Not assessed Tub/Shower Transfer Method:  (discussed using shower bench - built in ) Ambulation Related to ADLs: supervision ADL Comments: All education regarding ADLa dn cervical precautins completed. Handout given. husband present for education  and verbalized understanding.  Vision/Perception  Vision -  History Baseline Vision: Wears glasses all the time Perception Perception: Within Functional Limits Praxis Praxis: Intact Cognition Cognition Arousal/Alertness: Awake/alert Overall Cognitive Status: Appears within functional limits for tasks assessed Orientation Level: Oriented X4 Sensation/Coordination Sensation Light Touch: Appears Intact Stereognosis: Appears Intact Hot/Cold: Not tested Proprioception: Appears Intact Additional Comments: Pt states dramatic improvement in BUE symptoms Coordination Gross Motor Movements are Fluid and Coordinated: Yes Fine Motor Movements are Fluid and Coordinated: Yes Extremity Assessment RUE Assessment RUE Assessment: Within Functional Limits LUE Assessment LUE Assessment: Within Functional Limits Mobility  Bed Mobility Bed Mobility: Yes (supervision) Transfers Transfers: Yes   End of Session OT - End of Session Equipment Utilized During Treatment: Gait belt;Cervical collar Activity Tolerance: Patient limited by pain;Other (comment) (Limited by dysphagia related to apparent surgery) Patient left: in bed;with call bell in reach;with family/visitor present Nurse Communication: Mobility status for transfers;Other (comment) (ice on neck to decrease edema) General Behavior During Session: [facility] for tasks performed Cognition: [facility] for tasks performed   [facility] 03/21/2011, 4:32 PM  [facility], OTR/L  [phone] 03/21/2011

## 2011-03-21 NOTE — Progress Notes (Signed)
Pt feels well.  Neck pain substantially improved.  Patient's swallowing has improved, but still having some difficulty. Has been keeping down ensure and pudding, and swallowing 1/2 tabs. Right arm pain resolved  AVSS Collar appropriately applied NVI  Pt POD #4 acfter 4 level acdf with imrpoved dysphagia  - will continune to observe dysphagia, as it is improving gradually - continue current  meds - d/c home once patient can reliably swallow meds and maintain nutrition

## 2011-03-21 NOTE — Progress Notes (Signed)
Pt still unable to advance in diet. But was able to tolerate moderate amounts of applesauce with whole pills today. Water is still very intolerable. Md aware and to follow up in am. Ardath Sax

## 2011-03-22 LAB — GLUCOSE, CAPILLARY
Glucose-Capillary: 106 mg/dL — ABNORMAL HIGH (ref 70–99)
Glucose-Capillary: 96 mg/dL (ref 70–99)

## 2011-03-22 NOTE — Progress Notes (Signed)
Pt feels well.  Neck pain substantially improved.  Patient's swallowing has improved substatially. Has been keeping down ensure and pudding, and if now swallowing pills without difficulty. Right arm pain resolved   AVSS Collar appropriately applied NVI   Pt POD #5 acfter 4 level acdf with improved dysphagia   - patient may be d/c ed home today - patient will slowly advance diet at home, will continue ensure drinks TID - f/u my office 1 week

## 2011-03-23 NOTE — Discharge Summary (Signed)
NAMEJENELLE, Kathryn [name] NO.:  [id number]  MEDICAL RECORD NO.:  [id number]  LOCATION:  5039                         FACILITY:  [facility]  PHYSICIAN:  Estill Bamberg, MD      DATE OF BIRTH:  20-Apr-1940  DATE OF ADMISSION:  03/17/2011 DATE OF DISCHARGE:  03/22/2011                              DISCHARGE SUMMARY   ADMISSION DIAGNOSIS:  Right-sided myeloradiculopathy.  DISCHARGE DIAGNOSIS:  Right-sided myeloradiculopathy.  ADMISSION HISTORY:  Ms. Leming is a pleasant 71 year old female who presented to me with symptoms and physical exam findings notable for myelopathy.  She also had right-sided cervical radiculopathy.  An MRI was notable for 4-level severe degenerative disk disease with varying degrees, spinal cord compression, right-sided cervical nerve compression.  The patient was therefore admitted on March 17, 2011, for 4 level ACDF.  HOSPITAL COURSE:  On March 17, 2011, the patient underwent 4-level anterior cervical decompression and fusion as noted above.  The patient tolerated the procedure well and was transferred to recovery in stable condition.  The patient's pain was well controlled postoperatively.  Of particular note, the patient did have significant dysphagia immediately following her surgery.  She did have difficulty swallowing both solids and liquids.  This was observed over time and she was kept on clear liquids diet.  Her symptoms did gradually improve over time.  On the morning of her discharge, on postop day #5, the patient was able to swallow pudding, applesauce, and keep down her pills.  There was a significant improvement noted throughout her hospital stay and therefore, we did decide to discharge her home.  Again, her pain was well controlled on the date of her discharge.  She was neurovascularly intact.  I did note significant resolution of her right arm pain.  DISCHARGE INSTRUCTIONS:  The patient will be maintained in her  [place] cervical collar and she was given a [place] collar to be used while showering.  The collar will be maintained for a total of 6 weeks.  She will avoid any lifting over 10 pounds.  She will follow up in my office in approximately 2 weeks after her procedure.     Estill Bamberg, MD     MD/MEDQ  D:  03/23/2011  T:  03/23/2011  Job:  [id number]  cc:   Candyce Churn. Allyne Gee, M.D.

## 2011-08-30 ENCOUNTER — Other Ambulatory Visit (HOSPITAL_COMMUNITY): Payer: Self-pay | Admitting: Internal Medicine

## 2011-08-30 DIAGNOSIS — Z139 Encounter for screening, unspecified: Secondary | ICD-10-CM

## 2011-09-02 ENCOUNTER — Ambulatory Visit (HOSPITAL_COMMUNITY)
Admission: RE | Admit: 2011-09-02 | Discharge: 2011-09-02 | Disposition: A | Discharge status: Home / Self Care | Payer: Medicare Other | Source: Ambulatory Visit | Attending: Internal Medicine | Admitting: Internal Medicine

## 2011-09-02 DIAGNOSIS — Z139 Encounter for screening, unspecified: Secondary | ICD-10-CM

## 2011-09-02 DIAGNOSIS — Z1231 Encounter for screening mammogram for malignant neoplasm of breast: Secondary | ICD-10-CM | POA: Insufficient documentation

## 2012-04-12 ENCOUNTER — Telehealth (HOSPITAL_COMMUNITY): Payer: Self-pay | Admitting: Dietician

## 2012-04-12 NOTE — Telephone Encounter (Signed)
Received voicemail from pt 04/11/12 at 1016. Returned call and left voicemail at 1227. Pt inquiring about services.

## 2012-04-12 NOTE — Telephone Encounter (Signed)
Received call from pt at 1207. She reports that she needs help with her diet, She desire weight loss. She has been diagnosed with prediabetes, but was recently taken off Janumet. She reports she has been following a low carb diet and food choices are limited, mainly meats and "a few vegetables". Offered pt to take free group DM class, but pt declined. She wants individual appointment. PCP is [name] in [place]. Informed pt that referral is needed for individual appointment. Provided Rd contact information.

## 2012-04-19 ENCOUNTER — Telehealth (HOSPITAL_COMMUNITY): Payer: Self-pay | Admitting: Dietician

## 2012-04-19 NOTE — Telephone Encounter (Signed)
Left message on pt voicemail at [phone].

## 2012-04-19 NOTE — Telephone Encounter (Signed)
Received referral via fax from Dr. [name] ([facility]) for dx: weight management, HTN, diabetes.

## 2012-04-23 NOTE — Telephone Encounter (Addendum)
Received voicemail from pt on 04/20/12 returning call. Called home phone at 0940; pt unavailable. Called pt cell phone (which was given in pt message) at [phone]. Appointment scheduled for 04/27/12 at 9:00 AM.

## 2012-04-27 ENCOUNTER — Encounter (HOSPITAL_COMMUNITY): Payer: Self-pay | Admitting: Dietician

## 2012-04-27 NOTE — Progress Notes (Signed)
Outpatient Initial Nutrition Assessment  Date:04/27/2012   Appt Start Time: 0900  [facility] Phys[name] Reason for Visit: obesity, DM, HTN  Nutrition Assessment:  Height: 5\' 5"  (165.1 cm)   Weight: 210 lb (95.255 kg)   IBW: 125# %IBW: 168% UBW: 240# %UBW: 88%  Body mass index is 34.95 kg/(m^2). Classified as obesity, class I.  Goal Weight: 170# (pt stated goal) Weight hx: Pt reports highest weigh was 248#, which she weighed around 1 year ago. Her lowest weight was 106# about 50 years ago. She reports she maintained a weight of 125-130# in her 30's up until about 23 years ago, when she stopped working. Since she stopped working she has reported progressive weight gain. She has lost approximately 26# (11% loss) in 1 year.   Estimated nutritional needs: 1400-1500 kcals daily, 76-95 grams protein daily, 1.4-1.5 L fluid daily  PMH:  Past Medical History  Diagnosis Date  . Dysphagia   . Hypertension     takes Coreg and Diovan daily  . Hyperlipidemia     takes Crestor daily  . Coronary artery disease     1 stent   . Seasonal allergies     takes Zyrtec nightly  . Headache     related to cervical issues  . Chronic neck pain   . Chronic back pain     buldging disc  . Osteoporosis     was taking shot 2xyr;but not taking anymore  . Bruises easily     pt is on Plavix  . Diverticulosis   . History of colonic polyps   . Diabetes mellitus     takes Janumet daily;pt states she is "prediabetic'  . Anxiety     takes Xanax bid prn    Medications:  Current Outpatient Rx  Name  Route  Sig  Dispense  Refill  . ALPRAZolam (XANAX) 0.25 MG tablet   Oral   Take 0.25 mg by mouth 2 (two) times daily as needed. For anxiety         . atorvastatin (LIPITOR) 40 MG tablet   Oral   Take 40 mg by mouth daily.         . calcium-vitamin D (OSCAL WITH D) 500-200 MG-UNIT per tablet   Oral   Take 1 tablet by mouth daily.         . carvedilol (COREG) 6.25 MG tablet   Oral   Take 6.25 mg by mouth 2 (two) times daily with a meal.          . cetirizine (ZYRTEC) 10 MG tablet   Oral   Take 10 mg by mouth at bedtime.         . clopidogrel (PLAVIX) 75 MG tablet   Oral   Take 75 mg by mouth daily.         . Multiple Vitamin (MULTIVITAMIN WITH MINERALS) TABS   Oral   Take 1 tablet by mouth daily.         . rosuvastatin (CRESTOR) 20 MG tablet   Oral   Take 20 mg by mouth at bedtime.         . sitaGLIPtan-metformin (JANUMET) 50-500 MG per tablet   Oral   Take 1 tablet by mouth at bedtime.         . valsartan-hydrochlorothiazide (DIOVAN-HCT) 160-12.5 MG per tablet   Oral   Take 1 tablet by mouth at bedtime.  Labs: CMP     Component Value Date/Time   NA 141 03/08/2011 0931   K 4.1 03/08/2011 0931   CL 103 03/08/2011 0931   CO2 28 03/08/2011 0931   GLUCOSE 95 03/08/2011 0931   BUN 8 03/08/2011 0931   CREATININE 0.93 03/08/2011 0931   CALCIUM 9.7 03/08/2011 0931   PROT 7.5 03/08/2011 0931   ALBUMIN 3.7 03/08/2011 0931   AST 16 03/08/2011 0931   ALT 11 03/08/2011 0931   ALKPHOS 97 03/08/2011 0931   BILITOT 0.3 03/08/2011 0931   GFRNONAA 60* 03/08/2011 0931   GFRAA 70* 03/08/2011 0931    Lipid Panel  No results found for this basename: chol, trig, hdl, cholhdl, vldl, ldlcalc     No results found for this basename: HGBA1C   Lab Results  Component Value Date   CREATININE 0.93 03/08/2011     Lifestyle/ social habits: Ms. Koc lives in [place] with her husband and her daughter, who recently moved from [place] and is temporarily staying with them until she finds he own home. 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 She currently watches her sister, who has multiple issues, daily from 11-5 PM. She reports that her stress level is an 8/10, citing losing weight has her main source of stress. She reports she currently participates in a 1  hour water aerobics class 3 times a week in the AM at the [facility], however, she does admit that she did not go there this week.   Nutrition hx/habits: Ms. Kluttz reports that she has been trying to lose weight for the past year by following a low carb diet. She reports she eats mainly meat and vegetables and has very little variety in her diet. She has incorporated pork skin into her diet, because they do not contain carbs. Her main beverages are diet sodas and water. Diet recall reflects limited food choices, skipping meals, and excessive snacking. She reports she is "afraid" to eat carbs. She desires to lose weight to prevent diabetes dx and also reduce the amount of medications she currently takes. She reports that she was instructed by her PCP to discontinue Janumet at the end of the month.  She reports hx of weight loss efforts in the past. She has tried Weight Watchers twice, most recently 4 years ago. She reports that she was able to lose 22# with [employer], however, did not continue with the program more than 6 weeks, due to the expense. She reports she has also tried to lose weight sporadically over the years by selecting better food choices. She reports that she has been more active in the past, but her back pain and neck limit her. She reports that she used to walk daily. She also has used exercise videos, such as Zumba and Walk Away the Pounds. She is looking forward to incoprorating the Walk Away the Pounds video into her exercise rountine.   Diet recall: Breakfast: [employer] egg and bacon sandwich without bread, coffee with sweetener; Lunch: skips; Snack: diet soda or water, pork skins; Dinner: chicken and cabbage OR pork chop; Snack: pork skins.   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   Nutrition Intervention: Nutrition rx: 1300 kcal NAS, no sugar added diet; 3 meals per day; no snacks; low calorie  beverages only; limit 1 starch per meal; 2.5 hours physical activity daily.  Education/Counseling Provided: Educated pt on  principles of diabetic diet and weight management. Discussed carbohydrate metabolism in relation to diabetes. Discussed principles of energy expenditure and how changes in diet and physical activity affect weight status. Educated pt on plate method, portion sizes, and sources of carbohydrate. Discussed importance of regular meal pattern. Discussed importance of adding sources of whole grains to diet to improve glycemic control. Also encouraged to choose low fat dairy, lean meats, and whole fruits and vegetables more often. Discussed nutritional content of foods commonly eaten and discussed healthier alternatives. Discussed importance of compliance to prevent further complications of disease. Educated pt on importance of physical activity (goal of at least 30 minutes 5 times per week) along with a healthy diet to achieve weight loss and glycemic goals. Encouraged slow, moderate weight loss of 1-2# per week, or 7-10% of current body weight as well as achieving a healthy lifestyle vs. Obtaining a certain body type or weight. Discussed importance of portion control and keeping a daily food diary to track intake. Provided "Destination Heart Healthy Eating" and AND Nutrition Care Manual Handouts ("Weight Loss Tips" and "1500 Calorie Diet") Used TeachBack to assess understanding.   Understanding, Motivation, Ability to Follow Recommendations: Expect fair to good compliance.   Monitoring and Evaluation: Goals: 1) 1-2# wt loss per week; 2) 3 meals per day; 3) hgb A1c < 6.5; 4) 2.5 hours physical activity per week  Recommendations: 1) Choose high quality carbohydrates (fruit, whole wheat cereals/pasta) most often; 2) Try to eat around the same time each day; 3) Avoid snacking; 4) Keep food diary  F/U: 4-6 weeks. F/u scheduled for 05/25/12 at 9 AM.   Melody Haver, RD, LDN 04/27/2012  Appt  EndTime:1030

## 2012-05-25 ENCOUNTER — Telehealth (HOSPITAL_COMMUNITY): Payer: Self-pay | Admitting: Dietician

## 2012-05-25 NOTE — Telephone Encounter (Signed)
Pt was a no-show for appointment scheduled for 05/25/2012 at 0900.

## 2012-05-28 ENCOUNTER — Telehealth (HOSPITAL_COMMUNITY): Payer: Self-pay | Admitting: Dietician

## 2012-05-28 NOTE — Telephone Encounter (Signed)
Received voicemail left by pt on 05/25/12 at 2236. Returned call at [phone]. Pt deeply apologetic for missing appointment, stating she has experienced multiple deaths of close friends in the past few weeks. Appointment rescheduled for 06/19/15 at 0900.

## 2012-06-18 ENCOUNTER — Encounter (HOSPITAL_COMMUNITY): Payer: Self-pay | Admitting: Dietician

## 2012-06-18 NOTE — Progress Notes (Signed)
Follow-Up Outpatient Nutrition Note Date: 06/18/2012  Appt Start Time: 0901  Nutrition Assessment:  Current weight: Weight: 209 lb (94.802 kg)  BMI: Body mass index is 34.78 kg/(m^2).  Weight changes: 0.4% wt loss x 7 weeks  Kathryn [name] returns for follow-up. She reports she is "disgusted" because she has lost very little weight. She reports "I"m messing up this carb counting thing". She reports frustration in incorporating carbs back into her diet.  She continues to skip meals. She reports eating excessive amounts of tic tacs to substitute for sweets. She reads labels and chose tic tacs because of their low carb content. She reports "If it has carbs in it, I put it back on the shelf".  She reports being under a lot of stress due to family obligations. She also reports frustration with being able to follow her diet due to all of the graduations parties she has attended. She feels like she is unable to eat the food served at parties in order to follow her diet. "I was eating a garden salad, and everyone else got to eat chicken nuggets and potato salad".   Labs: CMP     Component Value Date/Time   NA 141 03/08/2011 0931   K 4.1 03/08/2011 0931   CL 103 03/08/2011 0931   CO2 28 03/08/2011 0931   GLUCOSE 95 03/08/2011 0931   BUN 8 03/08/2011 0931   CREATININE 0.93 03/08/2011 0931   CALCIUM 9.7 03/08/2011 0931   PROT 7.5 03/08/2011 0931   ALBUMIN 3.7 03/08/2011 0931   AST 16 03/08/2011 0931   ALT 11 03/08/2011 0931   ALKPHOS 97 03/08/2011 0931   BILITOT 0.3 03/08/2011 0931   GFRNONAA 60* 03/08/2011 0931   GFRAA 70* 03/08/2011 0931    Lipid Panel  No results found for this basename: chol, trig, hdl, cholhdl, vldl, ldlcalc     No results found for this basename: HGBA1C   Lab Results  Component Value Date   CREATININE 0.93 03/08/2011     Diet recall: Breakfast (between 8:30-10 AM): eff and coffee with artifical sweetener; Lunch: skip; Dinner: meat and vegetables. Beverages consist mostly of diet  [employer]eps[employer].   Nutrition Diagnosis: Inconsistent carbohydrate intake r/t disordered eating pattern continues  Nutrition Intervention: Nutrition rx: Nutrition rx: 1300 kcal NAS, no sugar added diet; 3 meals per day; no snacks; low calorie beverages only; limit 1 starch per meal; 2.5 hours physical activity daily.  Education/ counseling provided: Reviewed principles of diabetic diet. Emphasized importance of consistent meal pattern. Discouraged excessive snacking. Reviewed sources of carbohydrate and servings sizes. Educated pt on carbohydrate counting; discussed meal pattern of meals per day continuing 45-60 grams of carbohydrates per meal. Reviewed meal pattern and gave pt specific examples on how to improve her diet. Provided "Carb Counting and Meal Planning" pamphlet. Teachback method used.   Understanding/Motivation/ Ability to follow recommendations: Expect fair to good compliance.   Monitoring and Evaluation: Previous Goals: 1) 1-2# wt loss per week- goal not met; 2) 3 meals per day- goal not met; 3) hgb A1c < 6.5- unable to assess (pt to see Dr. Allyne Gee tomorrow); 4) 2.5 hours physical activity per week- goal not met  Goals for next visit: 1) 1-2# wt loss per week; 2) 3 meals per day; 3) Hgb A1c < 6.5; 4) 2.5 hours physical activity per week  Recommendations: 1) Keep food diary; 2) Try to eat around same time each day; 3) Use measuring cups to accurately determine serving size  F/U: 6-8 weeks.  F/U scheduled for 08/20/12 at 0900.   Melody Haver, RD, LDN Date:06/18/2012 Appt EndTime: [phone]

## 2012-08-09 ENCOUNTER — Other Ambulatory Visit (HOSPITAL_COMMUNITY): Payer: Self-pay | Admitting: Internal Medicine

## 2012-08-09 DIAGNOSIS — Z139 Encounter for screening, unspecified: Secondary | ICD-10-CM

## 2012-08-13 ENCOUNTER — Telehealth (HOSPITAL_COMMUNITY): Payer: Self-pay | Admitting: Dietician

## 2012-08-13 NOTE — Telephone Encounter (Signed)
Returned call at 1428. Pt requesting cancelling appointment on 08/20/12. Offered reschedule, but she declined. She reports that due to illness, she does not want to continue to reschedule appointment. Informed pt that she is welcome to reschedule when she feels better.

## 2012-08-13 NOTE — Telephone Encounter (Signed)
Received voicemail from pt left at 1306.

## 2012-08-22 ENCOUNTER — Other Ambulatory Visit: Payer: Self-pay | Admitting: *Deleted

## 2012-09-03 ENCOUNTER — Ambulatory Visit (HOSPITAL_COMMUNITY)
Admission: RE | Admit: 2012-09-03 | Discharge: 2012-09-03 | Disposition: A | Discharge status: Home / Self Care | Payer: Medicare Other | Source: Ambulatory Visit | Attending: Internal Medicine | Admitting: Internal Medicine

## 2012-09-03 DIAGNOSIS — Z139 Encounter for screening, unspecified: Secondary | ICD-10-CM

## 2012-09-03 DIAGNOSIS — Z1231 Encounter for screening mammogram for malignant neoplasm of breast: Secondary | ICD-10-CM | POA: Insufficient documentation

## 2012-09-05 ENCOUNTER — Encounter: Payer: Self-pay | Admitting: Vascular Surgery

## 2012-09-06 ENCOUNTER — Other Ambulatory Visit (INDEPENDENT_AMBULATORY_CARE_PROVIDER_SITE_OTHER): Payer: Medicare Other | Admitting: *Deleted

## 2012-09-06 ENCOUNTER — Ambulatory Visit (INDEPENDENT_AMBULATORY_CARE_PROVIDER_SITE_OTHER): Payer: Medicare Other | Admitting: Vascular Surgery

## 2012-09-06 ENCOUNTER — Other Ambulatory Visit: Payer: Self-pay | Admitting: *Deleted

## 2012-09-06 ENCOUNTER — Encounter: Payer: Self-pay | Admitting: Vascular Surgery

## 2012-09-06 DIAGNOSIS — I6529 Occlusion and stenosis of unspecified carotid artery: Secondary | ICD-10-CM

## 2012-09-06 DIAGNOSIS — I6523 Occlusion and stenosis of bilateral carotid arteries: Secondary | ICD-10-CM | POA: Insufficient documentation

## 2012-09-06 NOTE — Progress Notes (Signed)
History of Present Illness:  Patient is a 72 y.o. year old female who presents for evaluation of carotid stenosis.  The patient denies symptoms of TIA, amaurosis, or stroke.  The patient is currently on antiplatelet therapy  in the form of aspirin.  The carotid stenosis was found duplex exam for evaluation of carotid bruit.  Other medical problems include hypertension, hyperlipidemia, coronary artery disease.  These are currently stable and followed by Dr. Nadara Eaton and her primary care physician.  Past Medical History  Diagnosis Date  . Dysphagia   . Hypertension     takes Coreg and Diovan daily  . Hyperlipidemia     takes Crestor daily  . Coronary artery disease     1 stent   . Seasonal allergies     takes Zyrtec nightly  . Headache(784.0)     related to cervical issues  . Chronic neck pain   . Chronic back pain     buldging disc  . Osteoporosis     was taking shot 2xyr;but not taking anymore  . Bruises easily     pt is on Plavix  . Diverticulosis   . History of colonic polyps   . Diabetes mellitus     takes Janumet daily;pt states she is "prediabetic'  . Anxiety     takes Xanax bid prn  . Peripheral vascular disease     Past Surgical History  Procedure Laterality Date  . Tonsillectomy      at age 55  . Abdominal hysterectomy  70's  . Cholecystectomy  2009  . Cardiac catheterization  2009/2011    1 stent placed  . Bunionectomy      left foot  . Colonoscopy    . Esophagogastroduodenoscopy       Social History History  Substance Use Topics  . Smoking status: Never Smoker   . Smokeless tobacco: Never Used  . Alcohol Use: No    Family History Family History  Problem Relation Age of Onset  . Anesthesia problems Neg Hx   . Hypotension Neg Hx   . Malignant hyperthermia Neg Hx   . Pseudochol deficiency Neg Hx   . Heart disease Mother     Heart Dissease before age 3  . Heart attack Mother   . Cancer Father   . Cancer Sister   . Leukemia Sister   . Cancer  Brother   . Heart disease Brother     Amputation  . Heart attack Brother   . Heart disease Brother   . Heart attack Brother     Allergies  Allergies  Allergen Reactions  . Codeine Hypertension     Current Outpatient Prescriptions  Medication Sig Dispense Refill  . ALPRAZolam (XANAX) 0.25 MG tablet Take 0.25 mg by mouth 2 (two) times daily as needed. For anxiety      . aspirin 81 MG tablet Take 81 mg by mouth daily.      [name] atorvastatin (LIPITOR) 40 MG tablet Take 40 mg by mouth daily.      . carvedilol (COREG) 6.25 MG tablet Take 6.25 mg by mouth 2 (two) times daily with a meal.       . cetirizine (ZYRTEC) 10 MG tablet Take 10 mg by mouth at bedtime.      . Multiple Vitamin (MULTIVITAMIN WITH MINERALS) TABS Take 1 tablet by mouth daily.      . valsartan-hydrochlorothiazide (DIOVAN-HCT) 160-12.5 MG per tablet Take 1 tablet by mouth at bedtime.      [name]  calcium-vitamin D (OSCAL WITH D) 500-200 MG-UNIT per tablet Take 1 tablet by mouth daily.      . clopidogrel (PLAVIX) 75 MG tablet Take 75 mg by mouth daily.      . rosuvastatin (CRESTOR) 20 MG tablet Take 20 mg by mouth at bedtime.      . sitaGLIPtan-metformin (JANUMET) 50-500 MG per tablet Take 1 tablet by mouth at bedtime.       No current facility-administered medications for this visit.    ROS:   General:  No weight loss, Fever, chills  HEENT: No recent headaches, no nasal bleeding, no visual changes, no sore throat  Neurologic: No dizziness, blackouts, seizures. No recent symptoms of stroke or mini- stroke. No recent episodes of slurred speech, or temporary blindness.  Cardiac: No recent episodes of chest pain/pressure, no shortness of breath at rest.  No shortness of breath with exertion.  Denies history of atrial fibrillation or irregular heartbeat  Vascular: No history of rest pain in feet.  No history of claudication.  No history of non-healing ulcer, No history of DVT   Pulmonary: No home oxygen, no productive cough,  no hemoptysis,  No asthma or wheezing  Musculoskeletal:  [ ]  Arthritis, [ ]  Low back pain,  [ ]  Joint pain  Hematologic:No history of hypercoagulable state.  No history of easy bleeding.  No history of anemia  Gastrointestinal: No hematochezia or melena,  No gastroesophageal reflux, no trouble swallowing  Urinary: [ ]  chronic Kidney disease, [ ]  on HD - [ ]  MWF or [ ]  TTHS, [ ]  Burning with urination, [ ]  Frequent urination, [ ]  Difficulty urinating;   Skin: No rashes  Psychological: No history of anxiety,  No history of depression   Physical Examination  Filed Vitals:   09/06/12 1144 09/06/12 1147  BP: 125/64 125/62  Pulse: 66 66  Resp: 16   Height: 5\' 5"  (1.651 m)   Weight: 213 lb (96.616 kg)   SpO2: 98%     Body mass index is 35.44 kg/(m^2).  General:  Alert and oriented, no acute distress HEENT: Normal Neck: Right side carotid bruit, no left bruit Pulmonary: Clear to auscultation bilaterally Cardiac: Regular Rate and Rhythm with 3/6 systolic murmur Gastrointestinal: Soft, non-tender, non-distended, no mass, obese Skin: No rash Extremity Pulses:  2+ radial, brachial, femoral pulses bilaterally Musculoskeletal: No deformity or edema  Neurologic: Upper and lower extremity motor 5/5 and symmetric  DATA: I reviewed a carotid duplex scan from [facility] dated 08/01/2012. This showed a peak systolic velocity of 442 cm/s and end-diastolic velocity of 131 cm/s in the right carotid bulb. There was a moderate left carotid stenosis. We repeated her carotid duplex exam in our lab today. However, we found primarily stenosis in the external carotid artery stenosis on the right side. The velocities in the external carotid artery were 553/137.  There is no significant internal carotid artery occlusive disease.   ASSESSMENT: Asymptomatic external carotid artery stenosis with no significant internal carotid artery stenosis known moderate left carotid stenosis not repeated in our  scan today   PLAN:  The patient was given signs and symptoms of stroke and TIA today. She will followup with a repeat carotid duplex in 6 months time. She will return sooner she develops any of the symptoms were discussed. Her cardiac murmur will continue to be followed by Dr. Nadara Eaton.   Fabienne Bruns, MD [facility] Office: [phone] Pager: [phone]

## 2013-01-12 ENCOUNTER — Encounter (HOSPITAL_COMMUNITY): Payer: Self-pay | Admitting: Emergency Medicine

## 2013-01-12 ENCOUNTER — Emergency Department (HOSPITAL_COMMUNITY)
Admission: EM | Admit: 2013-01-12 | Discharge: 2013-01-12 | Disposition: A | Discharge status: Home / Self Care | Payer: Medicare Other | Source: Home / Self Care | Attending: Family Medicine | Admitting: Family Medicine

## 2013-01-12 DIAGNOSIS — J45901 Unspecified asthma with (acute) exacerbation: Secondary | ICD-10-CM

## 2013-01-12 MED ORDER — IPRATROPIUM BROMIDE 0.02 % IN SOLN
RESPIRATORY_TRACT | Status: AC
  Filled 2013-01-12: qty 2.5

## 2013-01-12 MED ORDER — ALBUTEROL SULFATE (5 MG/ML) 0.5% IN NEBU: 5.0000 mg | INHALATION_SOLUTION | Freq: Once | RESPIRATORY_TRACT | Status: DC

## 2013-01-12 MED ORDER — PREDNISONE 50 MG PO TABS: 50.0000 mg | ORAL_TABLET | Freq: Every day | ORAL | Status: DC

## 2013-01-12 MED ORDER — IPRATROPIUM BROMIDE 0.02 % IN SOLN
0.5000 mg | Freq: Once | RESPIRATORY_TRACT | Status: AC
  Administered 2013-01-12: 0.5 mg via RESPIRATORY_TRACT

## 2013-01-12 MED ORDER — ALBUTEROL SULFATE (5 MG/ML) 0.5% IN NEBU
5.0000 mg | INHALATION_SOLUTION | Freq: Once | RESPIRATORY_TRACT | Status: AC
  Administered 2013-01-12: 5 mg via RESPIRATORY_TRACT

## 2013-01-12 MED ORDER — ALBUTEROL SULFATE (5 MG/ML) 0.5% IN NEBU
INHALATION_SOLUTION | RESPIRATORY_TRACT | Status: AC
  Filled 2013-01-12: qty 1

## 2013-01-12 NOTE — ED Notes (Signed)
C/o sob and wheezing x 2 wks.  Mild relief with inhaler.  Hx of asthma.  Chest soreness.  Denies any other symptoms.

## 2013-01-12 NOTE — ED Provider Notes (Signed)
Kathryn [name] is a 72 y.o. female who presents to Urgent Care today for shortness of breath wheezing and coughing. This is been going on for 2 weeks worsening recently. She's tried using her albuterol inhaler which helps a bit. She has a home nebulizer but has run out of albuterol. No fevers or chills nausea vomiting or diarrhea. She feels well otherwise. This is consistent with asthma exacerbation.   Past Medical History  Diagnosis Date  . Dysphagia   . Hypertension     takes Coreg and Diovan daily  . Hyperlipidemia     takes Crestor daily  . Coronary artery disease     1 stent   . Seasonal allergies     takes Zyrtec nightly  . Headache(784.0)     related to cervical issues  . Chronic neck pain   . Chronic back pain     buldging disc  . Osteoporosis     was taking shot 2xyr;but not taking anymore  . Bruises easily     pt is on Plavix  . Diverticulosis   . History of colonic polyps   . Diabetes mellitus     takes Janumet daily;pt states she is "prediabetic'  . Anxiety     takes Xanax bid prn  . Peripheral vascular disease    History  Substance Use Topics  . Smoking status: Never Smoker   . Smokeless tobacco: Never Used  . Alcohol Use: No   ROS as above Medications reviewed. No current facility-administered medications for this encounter.   Current Outpatient Prescriptions  Medication Sig Dispense Refill  . ALPRAZolam (XANAX) 0.25 MG tablet Take 0.25 mg by mouth 2 (two) times daily as needed. For anxiety      . aspirin 81 MG tablet Take 81 mg by mouth daily.      [name] atorvastatin (LIPITOR) 40 MG tablet Take 40 mg by mouth daily.      . calcium-vitamin D (OSCAL WITH D) 500-200 MG-UNIT per tablet Take 1 tablet by mouth daily.      . carvedilol (COREG) 6.25 MG tablet Take 6.25 mg by mouth 2 (two) times daily with a meal.       . cetirizine (ZYRTEC) 10 MG tablet Take 10 mg by mouth at bedtime.      . clopidogrel (PLAVIX) 75 MG tablet Take 75 mg by mouth daily.      .  Multiple Vitamin (MULTIVITAMIN WITH MINERALS) TABS Take 1 tablet by mouth daily.      . rosuvastatin (CRESTOR) 20 MG tablet Take 20 mg by mouth at bedtime.      . sitaGLIPtan-metformin (JANUMET) 50-500 MG per tablet Take 1 tablet by mouth at bedtime.      . valsartan-hydrochlorothiazide (DIOVAN-HCT) 160-12.5 MG per tablet Take 1 tablet by mouth at bedtime.      [name] albuterol (PROVENTIL) (5 MG/ML) 0.5% nebulizer solution Take 1 mL (5 mg total) by nebulization once.  20 mL  1  . predniSONE (DELTASONE) 50 MG tablet Take 1 tablet (50 mg total) by mouth daily.  5 tablet  0    Exam:  BP 175/60  Pulse 85  Temp(Src) 98.5 F (36.9 C) (Oral)  Resp 22  SpO2 99% Gen: Well NAD HEENT: EOMI,  MMM Lungs: Increased work of breathing with expiratory phase wheezing bilaterally. Heart: RRR no MRG Abd: NABS, Soft. NT, ND Exts: Non edematous BL  LE, warm and well perfused.   Patient was given a DuoNeb nebulizer treatment and had considerable  subjective improvement as well as improvement of her lung sounds.  Assessment and Plan: 72 y.o. female with asthma exacerbation. Plan to treat with prednisone, albuterol nebulizer solution and inhaler and followup with primary care provider. Discussed warning signs or symptoms. Please see discharge instructions. Patient expresses understanding.      Rodolph Bong, MD 01/12/13 [phone]

## 2013-03-14 ENCOUNTER — Ambulatory Visit: Payer: Medicare Other | Admitting: Vascular Surgery

## 2013-03-14 ENCOUNTER — Other Ambulatory Visit (HOSPITAL_COMMUNITY): Payer: Medicare Other

## 2013-03-28 ENCOUNTER — Encounter (HOSPITAL_COMMUNITY): Payer: Self-pay | Admitting: Emergency Medicine

## 2013-03-28 ENCOUNTER — Emergency Department (INDEPENDENT_AMBULATORY_CARE_PROVIDER_SITE_OTHER): Payer: Medicare Other

## 2013-03-28 ENCOUNTER — Emergency Department (HOSPITAL_COMMUNITY)
Admission: EM | Admit: 2013-03-28 | Discharge: 2013-03-28 | Disposition: A | Discharge status: Home / Self Care | Payer: Medicare Other | Source: Home / Self Care

## 2013-03-28 DIAGNOSIS — J069 Acute upper respiratory infection, unspecified: Secondary | ICD-10-CM

## 2013-03-28 DIAGNOSIS — J45901 Unspecified asthma with (acute) exacerbation: Secondary | ICD-10-CM

## 2013-03-28 DIAGNOSIS — R509 Fever, unspecified: Secondary | ICD-10-CM

## 2013-03-28 DIAGNOSIS — J329 Chronic sinusitis, unspecified: Secondary | ICD-10-CM

## 2013-03-28 MED ORDER — METHYLPREDNISOLONE SODIUM SUCC 125 MG IJ SOLR
125.0000 mg | Freq: Once | INTRAMUSCULAR | Status: AC
  Administered 2013-03-28: 125 mg via INTRAVENOUS

## 2013-03-28 MED ORDER — ALBUTEROL SULFATE (5 MG/ML) 0.5% IN NEBU: 5.0000 mg | INHALATION_SOLUTION | Freq: Once | RESPIRATORY_TRACT | Status: DC

## 2013-03-28 MED ORDER — ALBUTEROL SULFATE HFA 108 (90 BASE) MCG/ACT IN AERS: 2.0000 | INHALATION_SPRAY | Freq: Four times a day (QID) | RESPIRATORY_TRACT | Status: DC | PRN

## 2013-03-28 MED ORDER — AMOXICILLIN-POT CLAVULANATE 875-125 MG PO TABS: 1.0000 | ORAL_TABLET | Freq: Two times a day (BID) | ORAL | Status: DC

## 2013-03-28 MED ORDER — SPACER/AERO-HOLDING CHAMBERS DEVI: Status: DC

## 2013-03-28 MED ORDER — PREDNISONE 50 MG PO TABS: ORAL_TABLET | ORAL | Status: DC

## 2013-03-28 MED ORDER — METHYLPREDNISOLONE SODIUM SUCC 125 MG IJ SOLR
INTRAMUSCULAR | Status: AC
  Filled 2013-03-28: qty 2

## 2013-03-28 MED ORDER — IPRATROPIUM-ALBUTEROL 0.5-2.5 (3) MG/3ML IN SOLN: 3.0000 mL | RESPIRATORY_TRACT | Status: DC

## 2013-03-28 MED ORDER — IPRATROPIUM-ALBUTEROL 0.5-2.5 (3) MG/3ML IN SOLN
RESPIRATORY_TRACT | Status: AC
  Filled 2013-03-28: qty 3

## 2013-03-28 NOTE — ED Provider Notes (Signed)
Medical screening examination/treatment/procedure(s) were performed by a resident physician or non-physician practitioner and as the supervising physician I was immediately available for consultation/collaboration.  Lynne Leader, MD   Gregor Hams, MD 03/28/13 2107

## 2013-03-28 NOTE — ED Notes (Signed)
C/o cough with mucous, chest congestion, nausea, and body aches since last Friday. Pain is 5/10. OTC medications with no relief. Written by: Lenore Manner, SMA

## 2013-03-28 NOTE — Discharge Instructions (Signed)
You are suffering from an asthma attack brought on by a bacterial respiratory infection There is no sign of pneumonia Please start the antibioitics for the infection Please start albuterol every 4 hours for the next 2-3 days Please start the steroids tomorrow morning

## 2013-03-28 NOTE — ED Provider Notes (Signed)
CSN: [id number]     Arrival date & time 03/28/13  1554 History   None    Chief Complaint  Patient presents with  . URI     (Consider location/radiation/quality/duration/timing/severity/associated sxs/prior Treatment) HPI  Cough and difficulty breathing: ongoing for 7 days. Getting worse. Associated w/ HA, runny nose, occasional fever, nauseaus. Denies sick contacts. Achy all over. Cough is productive. Theraflu, advil, alka-seltzer +, and robitussin w/ codeine w/o much benefit. Tolerating PO. Denies vomiting, diarrhea, syncope, CP. Worse at night.    Past Medical History  Diagnosis Date  . Dysphagia   . Hypertension     takes Coreg and Diovan daily  . Hyperlipidemia     takes Crestor daily  . Coronary artery disease     1 stent   . Seasonal allergies     takes Zyrtec nightly  . Headache(784.0)     related to cervical issues  . Chronic neck pain   . Chronic back pain     buldging disc  . Osteoporosis     was taking shot 2xyr;but not taking anymore  . Bruises easily     pt is on Plavix  . Diverticulosis   . History of colonic polyps   . Diabetes mellitus     takes Janumet daily;pt states she is "prediabetic'  . Anxiety     takes Xanax bid prn  . Peripheral vascular disease    Past Surgical History  Procedure Laterality Date  . Tonsillectomy      at age 32  . Abdominal hysterectomy  70's  . Cholecystectomy  2009  . Cardiac catheterization  2009/2011    1 stent placed  . Bunionectomy      left foot  . Colonoscopy    . Esophagogastroduodenoscopy     Family History  Problem Relation Age of Onset  . Anesthesia problems Neg Hx   . Hypotension Neg Hx   . Malignant hyperthermia Neg Hx   . Pseudochol deficiency Neg Hx   . Heart disease Mother     Heart Dissease before age 61  . Heart attack Mother   . Cancer Father   . Cancer Sister   . Leukemia Sister   . Cancer Brother   . Heart disease Brother     Amputation  . Heart attack Brother   . Heart disease  Brother   . Heart attack Brother    History  Substance Use Topics  . Smoking status: Never Smoker   . Smokeless tobacco: Never Used  . Alcohol Use: No   OB History   Grav Para Term Preterm Abortions TAB SAB Ect Mult Living                 Review of Systems  Constitutional: Positive for fever and activity change.  Respiratory: Positive for shortness of breath and wheezing. Negative for chest tightness.   Cardiovascular: Negative for chest pain.      Allergies  Codeine  Home Medications   Current Outpatient Rx  Name  Route  Sig  Dispense  Refill  . albuterol (PROVENTIL HFA;VENTOLIN HFA) 108 (90 BASE) MCG/ACT inhaler   Inhalation   Inhale 2 puffs into the lungs every 6 (six) hours as needed for wheezing or shortness of breath.   1 Inhaler   2   . albuterol (PROVENTIL) (5 MG/ML) 0.5% nebulizer solution   Nebulization   Take 1 mL (5 mg total) by nebulization once.   20 mL   1   .  ALPRAZolam (XANAX) 0.25 MG tablet   Oral   Take 0.25 mg by mouth 2 (two) times daily as needed. For anxiety         . amoxicillin-clavulanate (AUGMENTIN) 875-125 MG per tablet   Oral   Take 1 tablet by mouth 2 (two) times daily.   20 tablet   0   . aspirin 81 MG tablet   Oral   Take 81 mg by mouth daily.         [name] atorvastatin (LIPITOR) 40 MG tablet   Oral   Take 40 mg by mouth daily.         . calcium-vitamin D (OSCAL WITH D) 500-200 MG-UNIT per tablet   Oral   Take 1 tablet by mouth daily.         . carvedilol (COREG) 6.25 MG tablet   Oral   Take 6.25 mg by mouth 2 (two) times daily with a meal.          . cetirizine (ZYRTEC) 10 MG tablet   Oral   Take 10 mg by mouth at bedtime.         . clopidogrel (PLAVIX) 75 MG tablet   Oral   Take 75 mg by mouth daily.         . Multiple Vitamin (MULTIVITAMIN WITH MINERALS) TABS   Oral   Take 1 tablet by mouth daily.         . predniSONE (DELTASONE) 50 MG tablet   Oral   Take 1 tablet (50 mg total) by mouth  daily.   5 tablet   0   . predniSONE (DELTASONE) 50 MG tablet      Take daily with breakfast   5 tablet   0   . rosuvastatin (CRESTOR) 20 MG tablet   Oral   Take 20 mg by mouth at bedtime.         . sitaGLIPtan-metformin (JANUMET) 50-500 MG per tablet   Oral   Take 1 tablet by mouth at bedtime.         [name] Spacer/Aero-Holding Dorise Bullion      Use with inhaler   1 each   1   . valsartan-hydrochlorothiazide (DIOVAN-HCT) 160-12.5 MG per tablet   Oral   Take 1 tablet by mouth at bedtime.          BP 129/74  Pulse 88  Temp(Src) 98.3 F (36.8 C) (Oral)  Resp 20  SpO2 98% Physical Exam  Constitutional: She is oriented to person, place, and time. She appears well-developed and well-nourished. No distress.  HENT:  Head: Normocephalic and atraumatic.  Frontal and maxillary sinuses ttp  Eyes: EOM are normal. Pupils are equal, round, and reactive to light.  Neck: Normal range of motion. Neck supple.  Cardiovascular: Normal rate, normal heart sounds and intact distal pulses.  Exam reveals no gallop.   No murmur heard. Pulmonary/Chest:  Increased WOB, Diffuse wheezing bilat that can be heard w/o stethoscope, crackles in R lung base.  Abdominal: Soft. She exhibits no distension.  Musculoskeletal: Normal range of motion. She exhibits no tenderness.  Neurological: She is alert and oriented to person, place, and time.  Skin: Skin is warm. No rash noted. She is not diaphoretic.  Psychiatric: She has a normal mood and affect. Her behavior is normal. Judgment and thought content normal.    ED Course  Procedures (including critical care time) Labs Review Labs Reviewed - No data to display Imaging Review Dg Chest 2 View  03/28/2013   CLINICAL DATA:  Cough, congestion  EXAM: CHEST  2 VIEW  COMPARISON:  DG CHEST 2 VIEW dated 08/06/2010  FINDINGS: No focal regions of consolidation or focal infiltrates appreciated. Linear areas of scarring project within periphery of the right  and left hemithoraces. The cardiac silhouette and osseous structures unremarkable. Low lung volumes.  IMPRESSION: No evidence of acute cardiopulmonary disease, chronic changes within the lung parenchyma which is stable.   Electronically Signed   By: Margaree Mackintosh M.D.   On: 03/28/2013 17:59      MDM   Final diagnoses:  Asthma exacerbation  URI (upper respiratory infection)  Febrile illness  Sinusitis    73 yo f w/ asthma exacerbation likely from initial viral uri which has turned into bacterial sinusitis. No sign of PNA on xray. Solumedrol 125 and duoneb in office w/ significant improvement in condition - albuterol script given (q4 for 2-3 days) - prednisone 50mg  QAM - Augmentin - precautions gi9ven and all questions answered   Linna Darner, MD Family Medicine PGY-3 03/28/2013, 6:21 PM      Waldemar Dickens, MD 03/28/13 [phone]

## 2013-08-02 ENCOUNTER — Other Ambulatory Visit (HOSPITAL_COMMUNITY): Payer: Self-pay | Admitting: Internal Medicine

## 2013-08-02 DIAGNOSIS — Z1231 Encounter for screening mammogram for malignant neoplasm of breast: Secondary | ICD-10-CM

## 2013-09-05 ENCOUNTER — Ambulatory Visit (HOSPITAL_COMMUNITY)
Admission: RE | Admit: 2013-09-05 | Discharge: 2013-09-05 | Disposition: A | Discharge status: Home / Self Care | Payer: Medicare Other | Source: Ambulatory Visit | Attending: Internal Medicine | Admitting: Internal Medicine

## 2013-09-05 DIAGNOSIS — Z1231 Encounter for screening mammogram for malignant neoplasm of breast: Secondary | ICD-10-CM | POA: Diagnosis present

## 2014-08-05 ENCOUNTER — Other Ambulatory Visit (HOSPITAL_COMMUNITY): Payer: Self-pay | Admitting: Internal Medicine

## 2014-08-05 DIAGNOSIS — Z1231 Encounter for screening mammogram for malignant neoplasm of breast: Secondary | ICD-10-CM

## 2014-09-11 ENCOUNTER — Ambulatory Visit (HOSPITAL_COMMUNITY)
Admission: RE | Admit: 2014-09-11 | Discharge: 2014-09-11 | Disposition: A | Discharge status: Home / Self Care | Payer: Medicare Other | Source: Ambulatory Visit | Attending: Internal Medicine | Admitting: Internal Medicine

## 2014-09-11 DIAGNOSIS — Z1231 Encounter for screening mammogram for malignant neoplasm of breast: Secondary | ICD-10-CM

## 2015-01-01 DIAGNOSIS — Z23 Encounter for immunization: Secondary | ICD-10-CM | POA: Diagnosis not present

## 2015-01-01 DIAGNOSIS — I129 Hypertensive chronic kidney disease with stage 1 through stage 4 chronic kidney disease, or unspecified chronic kidney disease: Secondary | ICD-10-CM | POA: Diagnosis not present

## 2015-01-01 DIAGNOSIS — M79604 Pain in right leg: Secondary | ICD-10-CM | POA: Diagnosis not present

## 2015-01-01 DIAGNOSIS — M791 Myalgia: Secondary | ICD-10-CM | POA: Diagnosis not present

## 2015-01-01 DIAGNOSIS — R7309 Other abnormal glucose: Secondary | ICD-10-CM | POA: Diagnosis not present

## 2015-01-01 DIAGNOSIS — N182 Chronic kidney disease, stage 2 (mild): Secondary | ICD-10-CM | POA: Diagnosis not present

## 2015-01-23 DIAGNOSIS — M2041 Other hammer toe(s) (acquired), right foot: Secondary | ICD-10-CM | POA: Diagnosis not present

## 2015-02-05 DIAGNOSIS — Z01818 Encounter for other preprocedural examination: Secondary | ICD-10-CM | POA: Diagnosis not present

## 2015-02-05 DIAGNOSIS — M2041 Other hammer toe(s) (acquired), right foot: Secondary | ICD-10-CM | POA: Diagnosis not present

## 2015-02-12 DIAGNOSIS — M2040 Other hammer toe(s) (acquired), unspecified foot: Secondary | ICD-10-CM | POA: Diagnosis not present

## 2015-02-12 DIAGNOSIS — M2041 Other hammer toe(s) (acquired), right foot: Secondary | ICD-10-CM | POA: Diagnosis not present

## 2015-02-18 DIAGNOSIS — M2041 Other hammer toe(s) (acquired), right foot: Secondary | ICD-10-CM | POA: Diagnosis not present

## 2015-03-05 DIAGNOSIS — I129 Hypertensive chronic kidney disease with stage 1 through stage 4 chronic kidney disease, or unspecified chronic kidney disease: Secondary | ICD-10-CM | POA: Diagnosis not present

## 2015-03-05 DIAGNOSIS — E7800 Pure hypercholesterolemia, unspecified: Secondary | ICD-10-CM | POA: Diagnosis not present

## 2015-03-05 DIAGNOSIS — Z79899 Other long term (current) drug therapy: Secondary | ICD-10-CM | POA: Diagnosis not present

## 2015-03-05 DIAGNOSIS — N182 Chronic kidney disease, stage 2 (mild): Secondary | ICD-10-CM | POA: Diagnosis not present

## 2015-03-12 DIAGNOSIS — M2041 Other hammer toe(s) (acquired), right foot: Secondary | ICD-10-CM | POA: Diagnosis not present

## 2015-04-02 DIAGNOSIS — M2041 Other hammer toe(s) (acquired), right foot: Secondary | ICD-10-CM | POA: Diagnosis not present

## 2015-04-10 DIAGNOSIS — M5416 Radiculopathy, lumbar region: Secondary | ICD-10-CM | POA: Diagnosis not present

## 2015-04-23 DIAGNOSIS — E785 Hyperlipidemia, unspecified: Secondary | ICD-10-CM | POA: Diagnosis not present

## 2015-04-23 DIAGNOSIS — N182 Chronic kidney disease, stage 2 (mild): Secondary | ICD-10-CM | POA: Diagnosis not present

## 2015-04-23 DIAGNOSIS — I129 Hypertensive chronic kidney disease with stage 1 through stage 4 chronic kidney disease, or unspecified chronic kidney disease: Secondary | ICD-10-CM | POA: Diagnosis not present

## 2015-04-23 DIAGNOSIS — R7309 Other abnormal glucose: Secondary | ICD-10-CM | POA: Diagnosis not present

## 2015-05-07 DIAGNOSIS — M5416 Radiculopathy, lumbar region: Secondary | ICD-10-CM | POA: Diagnosis not present

## 2015-05-28 DIAGNOSIS — M5416 Radiculopathy, lumbar region: Secondary | ICD-10-CM | POA: Diagnosis not present

## 2015-06-10 DIAGNOSIS — Z79899 Other long term (current) drug therapy: Secondary | ICD-10-CM | POA: Diagnosis not present

## 2015-06-10 DIAGNOSIS — E785 Hyperlipidemia, unspecified: Secondary | ICD-10-CM | POA: Diagnosis not present

## 2015-06-25 DIAGNOSIS — M5416 Radiculopathy, lumbar region: Secondary | ICD-10-CM | POA: Diagnosis not present

## 2015-07-16 DIAGNOSIS — Z86010 Personal history of colonic polyps: Secondary | ICD-10-CM | POA: Diagnosis not present

## 2015-07-16 DIAGNOSIS — Z1211 Encounter for screening for malignant neoplasm of colon: Secondary | ICD-10-CM | POA: Diagnosis not present

## 2015-07-16 DIAGNOSIS — R1310 Dysphagia, unspecified: Secondary | ICD-10-CM | POA: Diagnosis not present

## 2015-07-16 DIAGNOSIS — K5730 Diverticulosis of large intestine without perforation or abscess without bleeding: Secondary | ICD-10-CM | POA: Diagnosis not present

## 2015-07-31 DIAGNOSIS — R1310 Dysphagia, unspecified: Secondary | ICD-10-CM | POA: Diagnosis not present

## 2015-07-31 DIAGNOSIS — Z800 Family history of malignant neoplasm of digestive organs: Secondary | ICD-10-CM | POA: Diagnosis not present

## 2015-07-31 DIAGNOSIS — K621 Rectal polyp: Secondary | ICD-10-CM | POA: Diagnosis not present

## 2015-07-31 DIAGNOSIS — R1033 Periumbilical pain: Secondary | ICD-10-CM | POA: Diagnosis not present

## 2015-07-31 DIAGNOSIS — Z1211 Encounter for screening for malignant neoplasm of colon: Secondary | ICD-10-CM | POA: Diagnosis not present

## 2015-07-31 DIAGNOSIS — D128 Benign neoplasm of rectum: Secondary | ICD-10-CM | POA: Diagnosis not present

## 2015-08-27 ENCOUNTER — Other Ambulatory Visit (HOSPITAL_COMMUNITY): Payer: Self-pay | Admitting: Internal Medicine

## 2015-08-27 DIAGNOSIS — M791 Myalgia: Secondary | ICD-10-CM | POA: Diagnosis not present

## 2015-08-27 DIAGNOSIS — K5730 Diverticulosis of large intestine without perforation or abscess without bleeding: Secondary | ICD-10-CM | POA: Diagnosis not present

## 2015-08-27 DIAGNOSIS — Z1231 Encounter for screening mammogram for malignant neoplasm of breast: Secondary | ICD-10-CM

## 2015-08-27 DIAGNOSIS — Z800 Family history of malignant neoplasm of digestive organs: Secondary | ICD-10-CM | POA: Diagnosis not present

## 2015-08-27 DIAGNOSIS — Z86010 Personal history of colonic polyps: Secondary | ICD-10-CM | POA: Diagnosis not present

## 2015-09-17 ENCOUNTER — Ambulatory Visit (HOSPITAL_COMMUNITY)
Admission: RE | Admit: 2015-09-17 | Discharge: 2015-09-17 | Disposition: A | Discharge status: Home / Self Care | Payer: Medicare Other | Source: Ambulatory Visit | Attending: Internal Medicine | Admitting: Internal Medicine

## 2015-09-17 DIAGNOSIS — Z1231 Encounter for screening mammogram for malignant neoplasm of breast: Secondary | ICD-10-CM | POA: Insufficient documentation

## 2015-09-24 DIAGNOSIS — Z0000 Encounter for general adult medical examination without abnormal findings: Secondary | ICD-10-CM | POA: Diagnosis not present

## 2015-09-24 DIAGNOSIS — M2550 Pain in unspecified joint: Secondary | ICD-10-CM | POA: Diagnosis not present

## 2015-09-24 DIAGNOSIS — E559 Vitamin D deficiency, unspecified: Secondary | ICD-10-CM | POA: Diagnosis not present

## 2015-09-24 DIAGNOSIS — N182 Chronic kidney disease, stage 2 (mild): Secondary | ICD-10-CM | POA: Diagnosis not present

## 2015-09-24 DIAGNOSIS — R413 Other amnesia: Secondary | ICD-10-CM | POA: Diagnosis not present

## 2015-09-24 DIAGNOSIS — R7309 Other abnormal glucose: Secondary | ICD-10-CM | POA: Diagnosis not present

## 2015-09-24 DIAGNOSIS — I129 Hypertensive chronic kidney disease with stage 1 through stage 4 chronic kidney disease, or unspecified chronic kidney disease: Secondary | ICD-10-CM | POA: Diagnosis not present

## 2015-10-02 DIAGNOSIS — M25511 Pain in right shoulder: Secondary | ICD-10-CM | POA: Diagnosis not present

## 2015-10-02 DIAGNOSIS — M25512 Pain in left shoulder: Secondary | ICD-10-CM | POA: Diagnosis not present

## 2015-10-02 DIAGNOSIS — M25561 Pain in right knee: Secondary | ICD-10-CM | POA: Diagnosis not present

## 2015-10-08 DIAGNOSIS — I2510 Atherosclerotic heart disease of native coronary artery without angina pectoris: Secondary | ICD-10-CM | POA: Diagnosis not present

## 2015-10-08 DIAGNOSIS — R0989 Other specified symptoms and signs involving the circulatory and respiratory systems: Secondary | ICD-10-CM | POA: Diagnosis not present

## 2015-10-08 DIAGNOSIS — Z789 Other specified health status: Secondary | ICD-10-CM | POA: Diagnosis not present

## 2015-10-08 DIAGNOSIS — E7800 Pure hypercholesterolemia, unspecified: Secondary | ICD-10-CM | POA: Diagnosis not present

## 2015-10-16 DIAGNOSIS — H524 Presbyopia: Secondary | ICD-10-CM | POA: Diagnosis not present

## 2015-10-16 DIAGNOSIS — H52223 Regular astigmatism, bilateral: Secondary | ICD-10-CM | POA: Diagnosis not present

## 2015-10-16 DIAGNOSIS — H5203 Hypermetropia, bilateral: Secondary | ICD-10-CM | POA: Diagnosis not present

## 2015-10-16 DIAGNOSIS — H25813 Combined forms of age-related cataract, bilateral: Secondary | ICD-10-CM | POA: Diagnosis not present

## 2015-12-17 DIAGNOSIS — M5416 Radiculopathy, lumbar region: Secondary | ICD-10-CM | POA: Diagnosis not present

## 2015-12-25 DIAGNOSIS — M67912 Unspecified disorder of synovium and tendon, left shoulder: Secondary | ICD-10-CM | POA: Diagnosis not present

## 2015-12-25 DIAGNOSIS — M67911 Unspecified disorder of synovium and tendon, right shoulder: Secondary | ICD-10-CM | POA: Diagnosis not present

## 2016-01-14 DIAGNOSIS — E7800 Pure hypercholesterolemia, unspecified: Secondary | ICD-10-CM | POA: Diagnosis not present

## 2016-01-14 DIAGNOSIS — I2510 Atherosclerotic heart disease of native coronary artery without angina pectoris: Secondary | ICD-10-CM | POA: Diagnosis not present

## 2016-01-14 DIAGNOSIS — Z789 Other specified health status: Secondary | ICD-10-CM | POA: Diagnosis not present

## 2016-01-14 DIAGNOSIS — Z006 Encounter for examination for normal comparison and control in clinical research program: Secondary | ICD-10-CM | POA: Diagnosis not present

## 2016-01-28 DIAGNOSIS — I129 Hypertensive chronic kidney disease with stage 1 through stage 4 chronic kidney disease, or unspecified chronic kidney disease: Secondary | ICD-10-CM | POA: Diagnosis not present

## 2016-01-28 DIAGNOSIS — N182 Chronic kidney disease, stage 2 (mild): Secondary | ICD-10-CM | POA: Diagnosis not present

## 2016-01-28 DIAGNOSIS — Z1389 Encounter for screening for other disorder: Secondary | ICD-10-CM | POA: Diagnosis not present

## 2016-01-28 DIAGNOSIS — R7303 Prediabetes: Secondary | ICD-10-CM | POA: Diagnosis not present

## 2016-02-23 DIAGNOSIS — M5416 Radiculopathy, lumbar region: Secondary | ICD-10-CM | POA: Diagnosis not present

## 2016-03-11 DIAGNOSIS — I129 Hypertensive chronic kidney disease with stage 1 through stage 4 chronic kidney disease, or unspecified chronic kidney disease: Secondary | ICD-10-CM | POA: Diagnosis not present

## 2016-03-11 DIAGNOSIS — N182 Chronic kidney disease, stage 2 (mild): Secondary | ICD-10-CM | POA: Diagnosis not present

## 2016-03-17 DIAGNOSIS — M85852 Other specified disorders of bone density and structure, left thigh: Secondary | ICD-10-CM | POA: Diagnosis not present

## 2016-03-17 DIAGNOSIS — R350 Frequency of micturition: Secondary | ICD-10-CM | POA: Diagnosis not present

## 2016-03-17 DIAGNOSIS — R7303 Prediabetes: Secondary | ICD-10-CM | POA: Diagnosis not present

## 2016-05-11 ENCOUNTER — Emergency Department (HOSPITAL_COMMUNITY)
Admission: EM | Admit: 2016-05-11 | Discharge: 2016-05-11 | Disposition: A | Discharge status: Home / Self Care | Payer: Medicare Other | Attending: Emergency Medicine | Admitting: Emergency Medicine

## 2016-05-11 ENCOUNTER — Emergency Department (HOSPITAL_COMMUNITY): Payer: Medicare Other

## 2016-05-11 ENCOUNTER — Encounter (HOSPITAL_COMMUNITY): Payer: Self-pay | Admitting: *Deleted

## 2016-05-11 DIAGNOSIS — I10 Essential (primary) hypertension: Secondary | ICD-10-CM | POA: Diagnosis not present

## 2016-05-11 DIAGNOSIS — Z79899 Other long term (current) drug therapy: Secondary | ICD-10-CM | POA: Insufficient documentation

## 2016-05-11 DIAGNOSIS — Z7982 Long term (current) use of aspirin: Secondary | ICD-10-CM | POA: Insufficient documentation

## 2016-05-11 DIAGNOSIS — E119 Type 2 diabetes mellitus without complications: Secondary | ICD-10-CM | POA: Insufficient documentation

## 2016-05-11 DIAGNOSIS — R42 Dizziness and giddiness: Secondary | ICD-10-CM

## 2016-05-11 DIAGNOSIS — R51 Headache: Secondary | ICD-10-CM | POA: Diagnosis not present

## 2016-05-11 DIAGNOSIS — I2510 Atherosclerotic heart disease of native coronary artery without angina pectoris: Secondary | ICD-10-CM | POA: Diagnosis not present

## 2016-05-11 LAB — BASIC METABOLIC PANEL
Anion gap: 8 (ref 5–15)
BUN: 17 mg/dL (ref 6–20)
CO2: 27 mmol/L (ref 22–32)
Calcium: 9.3 mg/dL (ref 8.9–10.3)
Chloride: 102 mmol/L (ref 101–111)
Creatinine, Ser: 0.9 mg/dL (ref 0.44–1.00)
GFR calc Af Amer: >60 mL/min (ref >60)
GFR calc non Af Amer: >60 mL/min (ref >60)
Glucose, Bld: 158 mg/dL — ABNORMAL HIGH (ref 65–99)
Potassium: 3.7 mmol/L (ref 3.5–5.1)
Sodium: 137 mmol/L (ref 135–145)

## 2016-05-11 LAB — CBC
HCT: 35.6 % — ABNORMAL LOW (ref 36.0–46.0)
Hemoglobin: 11.4 g/dL — ABNORMAL LOW (ref 12.0–15.0)
MCH: 23.8 pg — ABNORMAL LOW (ref 26.0–34.0)
MCHC: 32 g/dL (ref 30.0–36.0)
MCV: 74.5 fL — ABNORMAL LOW (ref 78.0–100.0)
Platelets: 362 10*3/uL (ref 150–400)
RBC: 4.78 MIL/uL (ref 3.87–5.11)
RDW: 14.6 % (ref 11.5–15.5)
WBC: 7.5 10*3/uL (ref 4.0–10.5)

## 2016-05-11 LAB — URINALYSIS, ROUTINE W REFLEX MICROSCOPIC
Bilirubin Urine: NEGATIVE
Glucose, UA: NEGATIVE mg/dL
Hgb urine dipstick: NEGATIVE
Ketones, ur: NEGATIVE mg/dL
Leukocytes, UA: NEGATIVE
Nitrite: NEGATIVE
Protein, ur: NEGATIVE mg/dL
Specific Gravity, Urine: 1.006 (ref 1.005–1.030)
pH: 6 (ref 5.0–8.0)

## 2016-05-11 LAB — CBG MONITORING, ED: Glucose-Capillary: 184 mg/dL — ABNORMAL HIGH (ref 65–99)

## 2016-05-11 MED ORDER — MECLIZINE HCL 12.5 MG PO TABS
25.0000 mg | ORAL_TABLET | Freq: Once | ORAL | Status: AC
  Administered 2016-05-11: 25 mg via ORAL
  Filled 2016-05-11: qty 2

## 2016-05-11 MED ORDER — ONDANSETRON HCL 4 MG/2ML IJ SOLN
INTRAMUSCULAR | Status: AC
  Administered 2016-05-11: 4 mg via INTRAVENOUS
  Filled 2016-05-11: qty 2

## 2016-05-11 MED ORDER — MECLIZINE HCL 25 MG PO TABS: 25.0000 mg | ORAL_TABLET | Freq: Three times a day (TID) | ORAL | 0 refills | Status: DC | PRN

## 2016-05-11 MED ORDER — ONDANSETRON HCL 4 MG/2ML IJ SOLN
4.0000 mg | Freq: Once | INTRAMUSCULAR | Status: AC
  Administered 2016-05-11: 4 mg via INTRAVENOUS

## 2016-05-11 MED ORDER — PROMETHAZINE HCL 25 MG PO TABS: 25.0000 mg | ORAL_TABLET | Freq: Four times a day (QID) | ORAL | 0 refills | Status: DC | PRN

## 2016-05-11 MED ORDER — SODIUM CHLORIDE 0.9 % IV BOLUS (SEPSIS)
1000.0000 mL | Freq: Once | INTRAVENOUS | Status: AC
  Administered 2016-05-11: 1000 mL via INTRAVENOUS

## 2016-05-11 NOTE — ED Provider Notes (Signed)
[facility] DEPT Provider Note   CSN: [id number] Arrival date & time: 05/11/16  1444  By signing my name below, I, Sonum Patel, attest that this documentation has been prepared under the direction and in the presence of Virgel Manifold, MD. Electronically Signed: Ludger Nutting, Scribe. 05/11/16. 3:15 PM.  History   Chief Complaint Chief Complaint  Patient presents with  . Dizziness    The history is provided by the patient. No language interpreter was used.     HPI Comments: Kathryn [name] is a 76 y.o. female who presents to the Emergency Department complaining of an episode of syncope that occurred PTA. Patient states she was sitting in her car when she became dizzy, describing it as though things were spinning. She reports associated nausea and diaphoresis. She is unsure how long the syncope lasted but states it wasn't very long. She reports a posterior HA for the past 3 weeks and has taken Tylenol with transient relief. She states her HA was unchanged with this syncopal episode. She denies similar symptoms in the past or history of vertigo. She denies any changes in medications. She states she has been eating and drinking well today. She denies tinnitus, CP, back pain. Her last stress test was 1 year ago.   Cardiologist: Einar Gip   Past Medical History:  Diagnosis Date  . Anxiety    takes Xanax bid prn  . Bruises easily    pt is on Plavix  . Chronic back pain    buldging disc  . Chronic neck pain   . Coronary artery disease    1 stent   . Diabetes mellitus    takes Janumet daily;pt states she is "prediabetic'  . Diverticulosis   . Dysphagia   . Headache(784.0)    related to cervical issues  . History of colonic polyps   . Hyperlipidemia    takes Crestor daily  . Hypertension    takes Coreg and Diovan daily  . Osteoporosis    was taking shot 2xyr;but not taking anymore  . Peripheral vascular disease ([place])   . Seasonal allergies    takes Zyrtec nightly    Patient  Active Problem List   Diagnosis Date Noted  . 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 ABDOMINAL HYSTERECTOMY  70's  . BUNIONECTOMY     left foot  . CARDIAC CATHETERIZATION  2009/2011   1 stent placed  . CHOLECYSTECTOMY  2009  . COLONOSCOPY    . ESOPHAGOGASTRODUODENOSCOPY    . TONSILLECTOMY     at age 24    OB History    No data available       Home Medications    Prior to Admission medications   Medication Sig Start Date End Date Taking? Authorizing Provider  albuterol (PROVENTIL HFA;VENTOLIN HFA) 108 (90 BASE) MCG/ACT inhaler Inhale 2 puffs into the lungs every 6 (six) hours as needed for wheezing or shortness of breath. 03/28/13   Waldemar Dickens, MD  albuterol (PROVENTIL) (5 MG/ML) 0.5% nebulizer solution Take 1 mL (5 mg total) by nebulization once. 03/28/13   Waldemar Dickens, MD  ALPRAZolam Duanne Moron) 0.25 MG tablet Take 0.25 mg by mouth 2 (two) times daily as needed. For anxiety    Historical Provider, MD  amoxicillin-clavulanate (AUGMENTIN) 875-125 MG per tablet Take 1 tablet by mouth 2 (two) times daily. 03/28/13   Waldemar Dickens, MD  aspirin 81  MG tablet Take 81 mg by mouth daily.    Historical Provider, MD  atorvastatin (LIPITOR) 40 MG tablet Take 40 mg by mouth daily.    Historical Provider, MD  calcium-vitamin D (OSCAL WITH D) 500-200 MG-UNIT per tablet Take 1 tablet by mouth daily.    Historical Provider, MD  carvedilol (COREG) 6.25 MG tablet Take 6.25 mg by mouth 2 (two) times daily with a meal.     Historical Provider, MD  cetirizine (ZYRTEC) 10 MG tablet Take 10 mg by mouth at bedtime.    Historical Provider, MD  clopidogrel (PLAVIX) 75 MG tablet Take 75 mg by mouth daily.    Historical Provider, MD  Multiple Vitamin (MULTIVITAMIN WITH MINERALS) TABS Take 1 tablet by mouth daily.    Historical Provider, MD  predniSONE (DELTASONE) 50 MG tablet Take 1 tablet (50  mg total) by mouth daily. 01/12/13   Gregor Hams, MD  predniSONE (DELTASONE) 50 MG tablet Take daily with breakfast 03/28/13   Waldemar Dickens, MD  rosuvastatin (CRESTOR) 20 MG tablet Take 20 mg by mouth at bedtime.    Historical Provider, MD  sitaGLIPtan-metformin (JANUMET) 50-500 MG per tablet Take 1 tablet by mouth at bedtime.    Historical Provider, MD  Spacer/Aero-Holding [facility] Use with inhaler 03/28/13   Waldemar Dickens, MD  valsartan-hydrochlorothiazide (DIOVAN-HCT) 160-12.5 MG per tablet Take 1 tablet by mouth at bedtime.    Historical Provider, MD    Family History Family History  Problem Relation Age of Onset  . Heart disease Mother     Heart Dissease before age 61  . Heart attack Mother   . Cancer Father   . Cancer Sister   . Leukemia Sister   . Cancer Brother   . Heart disease Brother     Amputation  . Heart attack Brother   . Heart disease Brother   . Heart attack Brother   . Anesthesia problems Neg Hx   . Hypotension Neg Hx   . Malignant hyperthermia Neg Hx   . Pseudochol deficiency Neg Hx     Social History Social History  Substance Use Topics  . Smoking status: Never Smoker  . Smokeless tobacco: Never Used  . Alcohol use No     Allergies   Codeine   Review of Systems Review of Systems  A complete 10 system review of systems was obtained and all systems are negative except as noted in the HPI and PMH.    Physical Exam Updated Vital Signs BP 130/69   Pulse 74   Temp 97.6 F (36.4 C) (Oral)   Resp 15   Ht 5\' 5"  (1.651 m)   Wt 210 lb (95.3 kg)   SpO2 99%   BMI 34.95 kg/m   Physical Exam  Constitutional: She is oriented to person, place, and time. She appears well-developed and well-nourished. No distress.  HENT:  Head: Normocephalic and atraumatic.  Eyes: EOM are normal. Pupils are equal, round, and reactive to light.  Neck: Normal range of motion.  Cardiovascular: Normal rate, regular rhythm and normal heart sounds.     Pulmonary/Chest: Effort normal and breath sounds normal.  Abdominal: Soft. She exhibits no distension. There is no tenderness.  Musculoskeletal: Normal range of motion. She exhibits edema (minimal pitting lower extremity edema).  Neurological: She is alert and oriented to person, place, and time. She displays normal reflexes. No cranial nerve deficit or sensory deficit. She exhibits normal muscle tone. Coordination normal.  5/5 strength in major  muscle groups of  bilateral upper and lower extremities. Speech normal. No facial asymmetry. Normal finger to nose testing  Skin: Skin is warm and dry.  Psychiatric: She has a normal mood and affect. Judgment normal.  Nursing note and vitals reviewed.    ED Treatments / Results  DIAGNOSTIC STUDIES: Oxygen Saturation is 99% on RA, normal by my interpretation.    COORDINATION OF CARE: 2:54 PM Discussed treatment plan with pt at bedside and pt agreed to plan.   Labs (all labs ordered are listed, but only abnormal results are displayed) Labs Reviewed  BASIC METABOLIC PANEL - Abnormal; Notable for the following:       Result Value   Glucose, Bld 158 (*)    All other components within normal limits  CBC - Abnormal; Notable for the following:    Hemoglobin 11.4 (*)    HCT 35.6 (*)    MCV 74.5 (*)    MCH 23.8 (*)    All other components within normal limits  URINALYSIS, ROUTINE W REFLEX MICROSCOPIC - Abnormal; Notable for the following:    Color, Urine COLORLESS (*)    All other components within normal limits  CBG MONITORING, ED - Abnormal; Notable for the following:    Glucose-Capillary 184 (*)    All other components within normal limits    EKG  EKG Interpretation  Date/Time:  Wednesday May 11 2016 14:53:30 EDT Ventricular Rate:  82 PR Interval:    QRS Duration: 95 QT Interval:  388 QTC Calculation: 454 R Axis:   31 Text Interpretation:  Sinus rhythm No significant change since last tracing Confirmed by Wilson Singer  MD, Annie Main  ([id number]) on 05/11/2016 4:39:21 PM       Radiology No results found.  Procedures Procedures (including critical care time)  Medications Ordered in ED Medications - No data to display   Initial Impression / Assessment and Plan / ED Course  I have reviewed the triage vital signs and the nursing notes.  Pertinent labs & imaging results that were available during my care of the patient were reviewed by me and considered in my medical decision making (see chart for details).     76yF with vertigo. Suspect peripheral. HA is concerning feature, but otherwise symptoms/exam reassuring. CT head is negative although there are significant limitations in CT evaluating for pathology in posterior fossa. Neuro exam is nonfocal. Symptoms positional. Nystagmus abates with fixation.   Final Clinical Impressions(s) / ED Diagnoses   Final diagnoses:  Vertigo    New Prescriptions New Prescriptions   No medications on file   I personally preformed the services scribed in my presence. The recorded information has been reviewed is accurate. Virgel Manifold, MD.    Virgel Manifold, MD 05/13/16 [phone]

## 2016-05-11 NOTE — ED Notes (Signed)
ED Provider at bedside. 

## 2016-05-11 NOTE — ED Triage Notes (Signed)
Pt comes in with right sides head pain starting 3 weeks ago. Pt was in the parking lot at [facility] and became dizzy. She went to her car and states she "blacked out" for an unknown period of time. Pt states she felt hot and nauseous before this happened. Pt is alert now. Denies any unilateral weakness. No speech problems. Pt continues to have dizziness.

## 2016-05-30 DIAGNOSIS — J309 Allergic rhinitis, unspecified: Secondary | ICD-10-CM | POA: Diagnosis not present

## 2016-05-30 DIAGNOSIS — Z79899 Other long term (current) drug therapy: Secondary | ICD-10-CM | POA: Diagnosis not present

## 2016-05-30 DIAGNOSIS — R7303 Prediabetes: Secondary | ICD-10-CM | POA: Diagnosis not present

## 2016-07-14 DIAGNOSIS — M5416 Radiculopathy, lumbar region: Secondary | ICD-10-CM | POA: Diagnosis not present

## 2016-09-09 ENCOUNTER — Other Ambulatory Visit (HOSPITAL_COMMUNITY): Payer: Self-pay | Admitting: Internal Medicine

## 2016-09-09 DIAGNOSIS — Z1231 Encounter for screening mammogram for malignant neoplasm of breast: Secondary | ICD-10-CM

## 2016-09-19 ENCOUNTER — Ambulatory Visit (HOSPITAL_COMMUNITY)
Admission: RE | Admit: 2016-09-19 | Discharge: 2016-09-19 | Disposition: A | Discharge status: Home / Self Care | Payer: Medicare Other | Source: Ambulatory Visit | Attending: Internal Medicine | Admitting: Internal Medicine

## 2016-09-19 DIAGNOSIS — Z1231 Encounter for screening mammogram for malignant neoplasm of breast: Secondary | ICD-10-CM | POA: Insufficient documentation

## 2016-10-20 DIAGNOSIS — M5416 Radiculopathy, lumbar region: Secondary | ICD-10-CM | POA: Diagnosis not present

## 2016-11-03 DIAGNOSIS — I129 Hypertensive chronic kidney disease with stage 1 through stage 4 chronic kidney disease, or unspecified chronic kidney disease: Secondary | ICD-10-CM | POA: Diagnosis not present

## 2016-11-03 DIAGNOSIS — D649 Anemia, unspecified: Secondary | ICD-10-CM | POA: Diagnosis not present

## 2016-11-03 DIAGNOSIS — E559 Vitamin D deficiency, unspecified: Secondary | ICD-10-CM | POA: Diagnosis not present

## 2016-11-03 DIAGNOSIS — R7303 Prediabetes: Secondary | ICD-10-CM | POA: Diagnosis not present

## 2016-11-03 DIAGNOSIS — N182 Chronic kidney disease, stage 2 (mild): Secondary | ICD-10-CM | POA: Diagnosis not present

## 2016-11-03 DIAGNOSIS — Z0000 Encounter for general adult medical examination without abnormal findings: Secondary | ICD-10-CM | POA: Diagnosis not present

## 2016-11-03 DIAGNOSIS — Z23 Encounter for immunization: Secondary | ICD-10-CM | POA: Diagnosis not present

## 2016-11-25 DIAGNOSIS — I2510 Atherosclerotic heart disease of native coronary artery without angina pectoris: Secondary | ICD-10-CM | POA: Diagnosis not present

## 2016-11-25 DIAGNOSIS — I10 Essential (primary) hypertension: Secondary | ICD-10-CM | POA: Diagnosis not present

## 2016-11-25 DIAGNOSIS — E7800 Pure hypercholesterolemia, unspecified: Secondary | ICD-10-CM | POA: Diagnosis not present

## 2016-11-25 DIAGNOSIS — R0989 Other specified symptoms and signs involving the circulatory and respiratory systems: Secondary | ICD-10-CM | POA: Diagnosis not present

## 2016-12-29 DIAGNOSIS — R0989 Other specified symptoms and signs involving the circulatory and respiratory systems: Secondary | ICD-10-CM | POA: Diagnosis not present

## 2017-02-02 DIAGNOSIS — R0989 Other specified symptoms and signs involving the circulatory and respiratory systems: Secondary | ICD-10-CM | POA: Diagnosis not present

## 2017-02-02 DIAGNOSIS — I2510 Atherosclerotic heart disease of native coronary artery without angina pectoris: Secondary | ICD-10-CM | POA: Diagnosis not present

## 2017-02-02 DIAGNOSIS — I10 Essential (primary) hypertension: Secondary | ICD-10-CM | POA: Diagnosis not present

## 2017-02-14 DIAGNOSIS — I219 Acute myocardial infarction, unspecified: Secondary | ICD-10-CM

## 2017-02-14 HISTORY — DX: Acute myocardial infarction, unspecified: I21.9

## 2017-02-22 DIAGNOSIS — M5416 Radiculopathy, lumbar region: Secondary | ICD-10-CM | POA: Diagnosis not present

## 2017-03-09 DIAGNOSIS — R7303 Prediabetes: Secondary | ICD-10-CM | POA: Diagnosis not present

## 2017-03-09 DIAGNOSIS — N182 Chronic kidney disease, stage 2 (mild): Secondary | ICD-10-CM | POA: Diagnosis not present

## 2017-03-09 DIAGNOSIS — M5431 Sciatica, right side: Secondary | ICD-10-CM | POA: Diagnosis not present

## 2017-03-09 DIAGNOSIS — I129 Hypertensive chronic kidney disease with stage 1 through stage 4 chronic kidney disease, or unspecified chronic kidney disease: Secondary | ICD-10-CM | POA: Diagnosis not present

## 2017-03-09 DIAGNOSIS — Z79899 Other long term (current) drug therapy: Secondary | ICD-10-CM | POA: Diagnosis not present

## 2017-06-01 DIAGNOSIS — M25569 Pain in unspecified knee: Secondary | ICD-10-CM | POA: Diagnosis not present

## 2017-06-01 DIAGNOSIS — M2550 Pain in unspecified joint: Secondary | ICD-10-CM | POA: Diagnosis not present

## 2017-06-01 DIAGNOSIS — M25541 Pain in joints of right hand: Secondary | ICD-10-CM | POA: Diagnosis not present

## 2017-06-01 DIAGNOSIS — M25542 Pain in joints of left hand: Secondary | ICD-10-CM | POA: Diagnosis not present

## 2017-06-01 DIAGNOSIS — M25561 Pain in right knee: Secondary | ICD-10-CM | POA: Diagnosis not present

## 2017-06-01 DIAGNOSIS — M25562 Pain in left knee: Secondary | ICD-10-CM | POA: Diagnosis not present

## 2017-07-04 DIAGNOSIS — M5416 Radiculopathy, lumbar region: Secondary | ICD-10-CM | POA: Diagnosis not present

## 2017-07-13 DIAGNOSIS — N182 Chronic kidney disease, stage 2 (mild): Secondary | ICD-10-CM | POA: Diagnosis not present

## 2017-07-13 DIAGNOSIS — I129 Hypertensive chronic kidney disease with stage 1 through stage 4 chronic kidney disease, or unspecified chronic kidney disease: Secondary | ICD-10-CM | POA: Diagnosis not present

## 2017-07-13 DIAGNOSIS — R7309 Other abnormal glucose: Secondary | ICD-10-CM | POA: Diagnosis not present

## 2017-07-13 DIAGNOSIS — K529 Noninfective gastroenteritis and colitis, unspecified: Secondary | ICD-10-CM | POA: Diagnosis not present

## 2017-07-21 ENCOUNTER — Other Ambulatory Visit (HOSPITAL_COMMUNITY): Payer: Self-pay | Admitting: Internal Medicine

## 2017-07-21 DIAGNOSIS — Z79899 Other long term (current) drug therapy: Secondary | ICD-10-CM | POA: Diagnosis not present

## 2017-07-21 DIAGNOSIS — R1310 Dysphagia, unspecified: Secondary | ICD-10-CM

## 2017-08-01 ENCOUNTER — Other Ambulatory Visit: Payer: Self-pay

## 2017-08-01 ENCOUNTER — Encounter (HOSPITAL_COMMUNITY): Payer: Self-pay | Admitting: Emergency Medicine

## 2017-08-01 ENCOUNTER — Emergency Department (HOSPITAL_COMMUNITY)
Admission: EM | Admit: 2017-08-01 | Discharge: 2017-08-01 | Disposition: A | Discharge status: Home / Self Care | Payer: Medicare Other | Attending: Emergency Medicine | Admitting: Emergency Medicine

## 2017-08-01 ENCOUNTER — Emergency Department (HOSPITAL_COMMUNITY): Payer: Medicare Other

## 2017-08-01 DIAGNOSIS — E119 Type 2 diabetes mellitus without complications: Secondary | ICD-10-CM | POA: Insufficient documentation

## 2017-08-01 DIAGNOSIS — J3489 Other specified disorders of nose and nasal sinuses: Secondary | ICD-10-CM | POA: Diagnosis not present

## 2017-08-01 DIAGNOSIS — Z87891 Personal history of nicotine dependence: Secondary | ICD-10-CM | POA: Diagnosis not present

## 2017-08-01 DIAGNOSIS — R0981 Nasal congestion: Secondary | ICD-10-CM | POA: Diagnosis not present

## 2017-08-01 DIAGNOSIS — I2510 Atherosclerotic heart disease of native coronary artery without angina pectoris: Secondary | ICD-10-CM | POA: Diagnosis not present

## 2017-08-01 DIAGNOSIS — I119 Hypertensive heart disease without heart failure: Secondary | ICD-10-CM | POA: Insufficient documentation

## 2017-08-01 DIAGNOSIS — J209 Acute bronchitis, unspecified: Secondary | ICD-10-CM

## 2017-08-01 DIAGNOSIS — R05 Cough: Secondary | ICD-10-CM | POA: Diagnosis not present

## 2017-08-01 LAB — BASIC METABOLIC PANEL
Anion gap: 8 (ref 5–15)
BUN: 18 mg/dL (ref 6–20)
CHLORIDE: 101 mmol/L (ref 101–111)
CO2: 29 mmol/L (ref 22–32)
Calcium: 8.9 mg/dL (ref 8.9–10.3)
Creatinine, Ser: 1.78 mg/dL — ABNORMAL HIGH (ref 0.44–1.00)
GFR calc Af Amer: 30 mL/min — ABNORMAL LOW (ref >60)
GFR calc non Af Amer: 26 mL/min — ABNORMAL LOW (ref >60)
GLUCOSE: 118 mg/dL — AB (ref 65–99)
POTASSIUM: 5.1 mmol/L (ref 3.5–5.1)
Sodium: 138 mmol/L (ref 135–145)

## 2017-08-01 LAB — CBC WITH DIFFERENTIAL/PLATELET
Basophils Absolute: 0 10*3/uL (ref 0.0–0.1)
Basophils Relative: 0 %
EOS PCT: 2 %
Eosinophils Absolute: 0.2 10*3/uL (ref 0.0–0.7)
HCT: 36.4 % (ref 36.0–46.0)
HEMOGLOBIN: 11.1 g/dL — AB (ref 12.0–15.0)
LYMPHS ABS: 2.7 10*3/uL (ref 0.7–4.0)
LYMPHS PCT: 21 %
MCH: 23.2 pg — ABNORMAL LOW (ref 26.0–34.0)
MCHC: 30.5 g/dL (ref 30.0–36.0)
MCV: 76 fL — AB (ref 78.0–100.0)
MONOS PCT: 11 %
Monocytes Absolute: 1.4 10*3/uL — ABNORMAL HIGH (ref 0.1–1.0)
Neutro Abs: 8.3 10*3/uL — ABNORMAL HIGH (ref 1.7–7.7)
Neutrophils Relative %: 66 %
Platelets: 405 10*3/uL — ABNORMAL HIGH (ref 150–400)
RBC: 4.79 MIL/uL (ref 3.87–5.11)
RDW: 14.5 % (ref 11.5–15.5)
WBC: 12.7 10*3/uL — AB (ref 4.0–10.5)

## 2017-08-01 LAB — TROPONIN I: Troponin I: <0.03 ng/mL (ref <0.03)

## 2017-08-01 LAB — CBG MONITORING, ED: Glucose-Capillary: 99 mg/dL (ref 65–99)

## 2017-08-01 MED ORDER — IPRATROPIUM-ALBUTEROL 0.5-2.5 (3) MG/3ML IN SOLN
3.0000 mL | Freq: Once | RESPIRATORY_TRACT | Status: AC
  Administered 2017-08-01: 3 mL via RESPIRATORY_TRACT
  Filled 2017-08-01: qty 3

## 2017-08-01 MED ORDER — PREDNISONE 20 MG PO TABS: 40.0000 mg | ORAL_TABLET | Freq: Every day | ORAL | 0 refills | Status: DC

## 2017-08-01 MED ORDER — ALBUTEROL SULFATE (2.5 MG/3ML) 0.083% IN NEBU
5.0000 mg | INHALATION_SOLUTION | Freq: Once | RESPIRATORY_TRACT | Status: AC
  Administered 2017-08-01: 5 mg via RESPIRATORY_TRACT
  Filled 2017-08-01: qty 6

## 2017-08-01 MED ORDER — PREDNISONE 20 MG PO TABS
40.0000 mg | ORAL_TABLET | Freq: Once | ORAL | Status: AC
  Administered 2017-08-01: 40 mg via ORAL
  Filled 2017-08-01: qty 2

## 2017-08-01 MED ORDER — ALBUTEROL SULFATE (2.5 MG/3ML) 0.083% IN NEBU
2.5000 mg | INHALATION_SOLUTION | Freq: Once | RESPIRATORY_TRACT | Status: AC
  Administered 2017-08-01: 2.5 mg via RESPIRATORY_TRACT
  Filled 2017-08-01: qty 3

## 2017-08-01 MED ORDER — AZITHROMYCIN 250 MG PO TABS: ORAL_TABLET | ORAL | 0 refills | Status: DC

## 2017-08-01 MED ORDER — ALBUTEROL SULFATE HFA 108 (90 BASE) MCG/ACT IN AERS
2.0000 | INHALATION_SPRAY | Freq: Once | RESPIRATORY_TRACT | Status: AC
  Administered 2017-08-01: 2 via RESPIRATORY_TRACT
  Filled 2017-08-01: qty 13.4

## 2017-08-01 NOTE — ED Triage Notes (Signed)
Pt c/o sore throat, nasal congestion, productive cough with yellow sputum, and chills since Sunday.

## 2017-08-01 NOTE — ED Notes (Signed)
Patient transported to X-ray 

## 2017-08-01 NOTE — ED Provider Notes (Signed)
Emergency Department Provider Note   I have reviewed the triage vital signs and the nursing notes.   HISTORY  Chief Complaint Sore Throat and Cough   HPI Kathryn [name] is a 77 y.o. female with PMH of CAD, DM, HLD, HTN, and osteoporosis resents to the emergency department for evaluation of nasal congestion, cough, and chills.  Symptoms began 2 days ago and have gradually worsened.  The patient has experienced some wheezing which is atypical for her.  She denies any chest pain.  Symptoms are worse with exertion.  No sick contacts.  She does experience some mild sore throat but is able to drink fluids and eat food without difficulty.  No abdominal discomfort, vomiting, diarrhea.  Past Medical History:  Diagnosis Date  . Anxiety    takes Xanax bid prn  . Bruises easily    pt is on Plavix  . Chronic back pain    buldging disc  . Chronic neck pain   . Coronary artery disease    1 stent   . Diabetes mellitus    takes Janumet daily;pt states she is "prediabetic'  . Diverticulosis   . Dysphagia   . Headache(784.0)    related to cervical issues  . History of colonic polyps   . Hyperlipidemia    takes Crestor daily  . Hypertension    takes Coreg and Diovan daily  . Osteoporosis    was taking shot 2xyr;but not taking anymore  . Peripheral vascular disease ([place])   . Seasonal allergies    takes Zyrtec nightly    Patient Active Problem List   Diagnosis Date Noted  . 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 ABDOMINAL HYSTERECTOMY  70's  . BUNIONECTOMY     left foot  . CARDIAC CATHETERIZATION  2009/2011   1 stent placed  . CHOLECYSTECTOMY  2009  . COLONOSCOPY    . ESOPHAGOGASTRODUODENOSCOPY    . TONSILLECTOMY     at age 56     Allergies Codeine  Family History  Problem Relation Age of Onset  . Heart disease Mother        Heart Dissease before age 45    . Heart attack Mother   . Cancer Father   . Cancer Sister   . Leukemia Sister   . Cancer Brother   . Heart disease Brother        Amputation  . Heart attack Brother   . Heart disease Brother   . Heart attack Brother   . Anesthesia problems Neg Hx   . Hypotension Neg Hx   . Malignant hyperthermia Neg Hx   . Pseudochol deficiency Neg Hx     Social History Social History   Tobacco Use  . Smoking status: Former Research scientist (life sciences)  . Smokeless tobacco: Never Used  Substance Use Topics  . Alcohol use: No  . Drug use: No    Review of Systems  Constitutional: No fever/chills Eyes: No visual changes. ENT: Positive sore throat and nasal congestion.  Cardiovascular: Denies chest pain. Respiratory: Positive shortness of breath. Gastrointestinal: No abdominal pain.  No nausea, no vomiting.  No diarrhea.  No constipation. Genitourinary: Negative for dysuria. Musculoskeletal: Negative for back pain. Skin: Negative for rash. Neurological: Negative for headaches, focal weakness or numbness.  10-point ROS otherwise negative.  ____________________________________________   PHYSICAL EXAM:  VITAL SIGNS: ED Triage Vitals [08/01/17 1423]  Enc Vitals Group  BP (!) 126/55     Pulse Rate 85     Resp 18     Temp 98.9 F (37.2 C)     Temp Source Oral     SpO2 99 %     Weight 240 lb (108.9 kg)     Height 5\' 5"  (1.651 m)     Pain Score 0   Constitutional: Alert and oriented. Well appearing and in no acute distress. Eyes: Conjunctivae are normal.  Head: Atraumatic. Nose: No congestion/rhinnorhea. Mouth/Throat: Mucous membranes are moist.  Neck: No stridor.  Cardiovascular: Normal rate, regular rhythm. Good peripheral circulation. Grossly normal heart sounds.   Respiratory: Increased respiratory effort.  No retractions. Lungs with end-expiratory wheezing bilaterally.  Gastrointestinal: Soft and nontender. No distention.  Musculoskeletal: No lower extremity tenderness nor edema. No  gross deformities of extremities. Neurologic:  Normal speech and language. No gross focal neurologic deficits are appreciated.  Skin:  Skin is warm, dry and intact. No rash noted.  ____________________________________________   LABS (all labs ordered are listed, but only abnormal results are displayed)  Labs Reviewed  CBC WITH DIFFERENTIAL/PLATELET - Abnormal; Notable for the following components:      Result Value   WBC 12.7 (*)    Hemoglobin 11.1 (*)    MCV 76.0 (*)    MCH 23.2 (*)    Platelets 405 (*)    Neutro Abs 8.3 (*)    Monocytes Absolute 1.4 (*)    All other components within normal limits  BASIC METABOLIC PANEL - Abnormal; Notable for the following components:   Glucose, Bld 118 (*)    Creatinine, Ser 1.78 (*)    GFR calc non Af Amer 26 (*)    GFR calc Af Amer 30 (*)    All other components within normal limits  TROPONIN I  CBG MONITORING, ED   ____________________________________________  EKG   EKG Interpretation  Date/Time:  Tuesday August 01 2017 17:03:23 EDT Ventricular Rate:  84 PR Interval:    QRS Duration: 88 QT Interval:  352 QTC Calculation: 416 R Axis:   32 Text Interpretation:  Sinus rhythm Ventricular premature complex Aberrant conduction of SV complex(es) Low voltage, precordial leads No STEMI.  Confirmed by Nanda Quinton [phone]) on 08/01/2017 6:11:54 PM       ____________________________________________  RADIOLOGY  Dg Chest 2 View  Result Date: 08/01/2017 CLINICAL DATA:  77 year old female with productive cough for 2 days. EXAM: CHEST - 2 VIEW COMPARISON:  03/28/2013 FINDINGS: The cardiomediastinal silhouette is unremarkable. Chronic peribronchial thickening again noted. There is no evidence of focal airspace disease, pulmonary edema, suspicious pulmonary nodule/mass, pleural effusion, or pneumothorax. No acute bony abnormalities are identified. Cervical spine surgical hardware again noted. IMPRESSION: No evidence of acute cardiopulmonary  disease. Chronic peribronchial thickening. Electronically Signed   By: Margarette Canada M.D.   On: 08/01/2017 15:04    ____________________________________________   PROCEDURES  Procedure(s) performed:   Procedures  None ____________________________________________   INITIAL IMPRESSION / ASSESSMENT AND PLAN / ED COURSE  Pertinent labs & imaging results that were available during my care of the patient were reviewed by me and considered in my medical decision making (see chart for details).  Patient presents to the emergency department for evaluation of wheezing, nasal congestion, shortness of breath.  She has increased work of breathing on arrival but responds well to initial nebulizer treatment.  Patient has no history of wheezing in the past.  Given this, patient was removed from the fast track area  and bedded in the acute care area where she will and receive additional nebulizer treatments.   06:00 PM Re-evaluated patient and reviewed CXR and labs. No acute abnormalities. Patient is feeling much better clinically. Plan for discharge with treatment for bronchitis.   At this time, I do not feel there is any life-threatening condition present. I have reviewed and discussed all results (EKG, imaging, lab, urine as appropriate), exam findings with patient. I have reviewed nursing notes and appropriate previous records.  I feel the patient is safe to be discharged home without further emergent workup. Discussed usual and customary return precautions. Patient and family (if present) verbalize understanding and are comfortable with this plan.  Patient will follow-up with their primary care provider. If they do not have a primary care provider, information for follow-up has been provided to them. All questions have been answered.  ____________________________________________  FINAL CLINICAL IMPRESSION(S) / ED DIAGNOSES  Final diagnoses:  Bronchitis, acute, with bronchospasm     MEDICATIONS  GIVEN DURING THIS VISIT:  Medications  ipratropium-albuterol (DUONEB) 0.5-2.5 (3) MG/3ML nebulizer solution 3 mL (3 mLs Nebulization Given 08/01/17 1554)  albuterol (PROVENTIL) (2.5 MG/3ML) 0.083% nebulizer solution 2.5 mg (2.5 mg Nebulization Given 08/01/17 1554)  predniSONE (DELTASONE) tablet 40 mg (40 mg Oral Given 08/01/17 1521)  albuterol (PROVENTIL) (2.5 MG/3ML) 0.083% nebulizer solution 5 mg (5 mg Nebulization Given 08/01/17 1715)  albuterol (PROVENTIL HFA;VENTOLIN HFA) 108 (90 Base) MCG/ACT inhaler 2 puff (2 puffs Inhalation Given 08/01/17 1715)     NEW OUTPATIENT MEDICATIONS STARTED DURING THIS VISIT:  New Prescriptions   AZITHROMYCIN (ZITHROMAX) 250 MG TABLET    Take first 2 tablets together, then 1 every day until finished.   PREDNISONE (DELTASONE) 20 MG TABLET    Take 2 tablets (40 mg total) by mouth daily.    Note:  This document was prepared using Dragon voice recognition software and may include unintentional dictation errors.  Nanda Quinton, MD Emergency Medicine    Long, [facility], MD 08/01/17 [phone]

## 2017-08-01 NOTE — ED Notes (Signed)
Patient back from x-ray 

## 2017-08-01 NOTE — ED Provider Notes (Signed)
[facility] EMERGENCY DEPARTMENT Provider Note   CSN: [id number] Arrival date & time: 08/01/17  1417     History   Chief Complaint Chief Complaint  Patient presents with  . Sore Throat  . Cough    HPI OHANA BIRDWELL is a 77 y.o. female.  HPI   MALAYAH DEMURO is a 77 y.o. female who presents to the Emergency Department complaining of sore throat, nasal congestion, and cough that is occasionally productive and associated with wheezing.  Symptoms began two days ago after flying home from [place].  Describes yellow sputum production and wheezing that becomes worse with exertion and excessive coughing.  Chills at home without fever.  She also reports having some pain to her ribs that is associated with cough only and began after excessive coughing.  She denies chest pain, vomiting, hemoptysis, leg swelling and shortness of breath.  No hx of COPD or asthma.      Past Medical History:  Diagnosis Date  . Anxiety    takes Xanax bid prn  . Bruises easily    pt is on Plavix  . Chronic back pain    buldging disc  . Chronic neck pain   . Coronary artery disease    1 stent   . Diabetes mellitus    takes Janumet daily;pt states she is "prediabetic'  . Diverticulosis   . Dysphagia   . Headache(784.0)    related to cervical issues  . History of colonic polyps   . Hyperlipidemia    takes Crestor daily  . Hypertension    takes Coreg and Diovan daily  . Osteoporosis    was taking shot 2xyr;but not taking anymore  . Peripheral vascular disease ([place])   . Seasonal allergies    takes Zyrtec nightly    Patient Active Problem List   Diagnosis Date Noted  . 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 ABDOMINAL HYSTERECTOMY  70's  . BUNIONECTOMY     left foot  . CARDIAC CATHETERIZATION  2009/2011   1 stent placed  . CHOLECYSTECTOMY  2009  . COLONOSCOPY    .  ESOPHAGOGASTRODUODENOSCOPY    . TONSILLECTOMY     at age 20     OB History   None      Home Medications    Prior to Admission medications   Medication Sig Start Date End Date Taking? Authorizing Provider  ALPRAZolam (XANAX) 0.25 MG tablet Take 0.25 mg by mouth 2 (two) times daily as needed for anxiety.    Yes [provider]  amLODipine (NORVASC) 10 MG tablet Take 1 tablet by mouth every evening.  05/16/17  Yes [provider]  carvedilol (COREG) 6.25 MG tablet Take 6.25 mg by mouth 2 (two) times daily with a meal.    Yes [provider]  cetirizine (ZYRTEC) 10 MG tablet Take 10 mg by mouth at bedtime.   Yes [provider]  gabapentin (NEURONTIN) 600 MG tablet Take 1 tablet by mouth 2 (two) times daily.  05/01/17  Yes [provider]  Investigational - Study Medication Take 1 tablet by mouth daily. Study name:008 Additional study details:For cholesterol (Dr. Skip Estimable)   Yes [provider]  Magnesium 500 MG TABS Take 1 tablet by mouth daily.   Yes [provider]  Multiple Vitamin (MULTIVITAMIN WITH MINERALS) TABS Take 1 tablet by mouth daily.   Yes [provider]  tiZANidine (ZANAFLEX) 4 MG tablet Take 1 tablet by mouth 2 (two) times daily. 07/04/17  Yes [provider]  valsartan-hydrochlorothiazide (DIOVAN-HCT) 160-12.5 MG per tablet Take 1 tablet by mouth at bedtime.   Yes [provider]    Family History Family History  Problem Relation Age of Onset  . Heart disease Mother        Heart Dissease before age 30  . Heart attack Mother   . Cancer Father   . Cancer Sister   . Leukemia Sister   . Cancer Brother   . Heart disease Brother        Amputation  . Heart attack Brother   . Heart disease Brother   . Heart attack Brother   . Anesthesia problems Neg Hx   . Hypotension Neg Hx   . Malignant hyperthermia Neg Hx   . Pseudochol deficiency Neg Hx     Social History Social History    Tobacco Use  . Smoking status: Former Research scientist (life sciences)  . Smokeless tobacco: Never Used  Substance Use Topics  . Alcohol use: No  . Drug use: No     Allergies   Codeine   Review of Systems Review of Systems  Constitutional: Positive for chills. Negative for appetite change and fever.  HENT: Positive for congestion, rhinorrhea and sore throat. Negative for trouble swallowing.   Respiratory: Positive for cough and wheezing. Negative for chest tightness and shortness of breath.   Cardiovascular: Negative for chest pain and leg swelling.  Gastrointestinal: Negative for abdominal pain, nausea and vomiting.  Musculoskeletal: Negative for arthralgias.  Skin: Negative for rash.  Neurological: Negative for dizziness, weakness and numbness.  Hematological: Negative for adenopathy.  All other systems reviewed and are negative.    Physical Exam Updated Vital Signs BP (!) 119/48 (BP Location: Left Arm)   Pulse 80   Temp 98.9 F (37.2 C) (Oral)   Resp 20   Ht 5\' 5"  (1.651 m)   Wt 108.9 kg (240 lb)   SpO2 100%   BMI 39.94 kg/m   Physical Exam  Constitutional: She is oriented to person, place, and time. She appears well-developed and well-nourished. No distress.  HENT:  Head: Normocephalic and atraumatic.  Right Ear: Tympanic membrane and ear canal normal.  Left Ear: Tympanic membrane and ear canal normal.  Nose: Mucosal edema present.  Mouth/Throat: Uvula is midline, oropharynx is clear and moist and mucous membranes are normal. No oropharyngeal exudate.  Eyes: Pupils are equal, round, and reactive to light. EOM are normal.  Neck: Normal range of motion, full passive range of motion without pain and phonation normal. Neck supple.  Cardiovascular: Normal rate, regular rhythm and intact distal pulses.  No murmur heard. Pulmonary/Chest: Effort normal. No stridor. No respiratory distress. She has wheezes. She has no rales. She exhibits no tenderness.  Audible wheezing throughout.  No  rales  Abdominal: Soft. There is no tenderness.  Musculoskeletal: Normal range of motion. She exhibits no edema.  Lymphadenopathy:    She has no cervical adenopathy.  Neurological: She is alert and oriented to person, place, and time. She exhibits normal muscle tone. Coordination normal.  Skin: Skin is warm and dry.  Psychiatric: She has a normal mood and affect.  Nursing note and vitals reviewed.    ED Treatments / Results  Labs (all labs ordered are listed, but only abnormal results are displayed) Labs Reviewed  CBC WITH DIFFERENTIAL/PLATELET - Abnormal; Notable for the following components:  Result Value   WBC 12.7 (*)    Hemoglobin 11.1 (*)    MCV 76.0 (*)    MCH 23.2 (*)    Platelets 405 (*)    Neutro Abs 8.3 (*)    Monocytes Absolute 1.4 (*)    All other components within normal limits  BASIC METABOLIC PANEL - Abnormal; Notable for the following components:   Glucose, Bld 118 (*)    Creatinine, Ser 1.78 (*)    GFR calc non Af Amer 26 (*)    GFR calc Af Amer 30 (*)    All other components within normal limits  TROPONIN I  CBG MONITORING, ED    EKG EKG Interpretation  Date/Time:  Tuesday August 01 2017 17:03:23 EDT Ventricular Rate:  84 PR Interval:    QRS Duration: 88 QT Interval:  352 QTC Calculation: 416 R Axis:   32 Text Interpretation:  Sinus rhythm Ventricular premature complex Aberrant conduction of SV complex(es) Low voltage, precordial leads No STEMI.  Confirmed by Nanda Quinton [phone]) on 08/01/2017 6:11:54 PM   Radiology Dg Chest 2 View  Result Date: 08/01/2017 CLINICAL DATA:  77 year old female with productive cough for 2 days. EXAM: CHEST - 2 VIEW COMPARISON:  03/28/2013 FINDINGS: The cardiomediastinal silhouette is unremarkable. Chronic peribronchial thickening again noted. There is no evidence of focal airspace disease, pulmonary edema, suspicious pulmonary nodule/mass, pleural effusion, or pneumothorax. No acute bony abnormalities are  identified. Cervical spine surgical hardware again noted. IMPRESSION: No evidence of acute cardiopulmonary disease. Chronic peribronchial thickening. Electronically Signed   By: Margarette Canada M.D.   On: 08/01/2017 15:04    Procedures Procedures (including critical care time)  Medications Ordered in ED Medications  ipratropium-albuterol (DUONEB) 0.5-2.5 (3) MG/3ML nebulizer solution 3 mL (3 mLs Nebulization Given 08/01/17 1554)  albuterol (PROVENTIL) (2.5 MG/3ML) 0.083% nebulizer solution 2.5 mg (2.5 mg Nebulization Given 08/01/17 1554)  predniSONE (DELTASONE) tablet 40 mg (40 mg Oral Given 08/01/17 1521)     Initial Impression / Assessment and Plan / ED Course  I have reviewed the triage vital signs and the nursing notes.  Pertinent labs & imaging results that were available during my care of the patient were reviewed by me and considered in my medical decision making (see chart for details).     1615 On recheck, patient received albuterol nebulizer treatment and continues to have audible wheezing, states that she does feel slightly better.   1625 consulted with Dr. Laverta Baltimore who also saw the patient.  Care plan discussed.  It is felt that the patient likely has bronchitis, but since she continues to wheeze and have some comorbidities, patient was moved to a monitored bed and additional nebulizer treatment ordered along with laboratory studies and EKG. Dr. Laverta Baltimore agrees to re evaluate pt after second neb and review EKG, labs and arrange dispo.    Final Clinical Impressions(s) / ED Diagnoses   Final diagnoses:  None    ED Discharge Orders    None       Kem Parkinson, Hershal Coria 08/01/17 1906    Margette Fast, MD 08/01/17 1918

## 2017-08-01 NOTE — ED Notes (Signed)
Pt getting 2nd breathing treatment, Lab at the bedside to get blood.

## 2017-08-01 NOTE — Discharge Instructions (Signed)
Take your antibiotic as directed until it is finished.  Use your albuterol inhaler 1 to 2 puffs every 4-6 hours as needed.  Start the prednisone prescription tomorrow.  Follow-up with your doctor this week for recheck or return to the ER for any worsening symptoms such as chest pain or increasing shortness of breath

## 2017-08-03 ENCOUNTER — Ambulatory Visit (HOSPITAL_COMMUNITY)
Admission: RE | Admit: 2017-08-03 | Discharge: 2017-08-03 | Disposition: A | Discharge status: Home / Self Care | Payer: Medicare Other | Source: Ambulatory Visit | Attending: Internal Medicine | Admitting: Internal Medicine

## 2017-08-03 DIAGNOSIS — R1310 Dysphagia, unspecified: Secondary | ICD-10-CM | POA: Diagnosis not present

## 2017-08-03 DIAGNOSIS — K219 Gastro-esophageal reflux disease without esophagitis: Secondary | ICD-10-CM | POA: Diagnosis not present

## 2017-08-11 ENCOUNTER — Encounter (HOSPITAL_COMMUNITY): Payer: Self-pay | Admitting: *Deleted

## 2017-08-11 ENCOUNTER — Ambulatory Visit (HOSPITAL_COMMUNITY)
Admission: EM | Admit: 2017-08-11 | Discharge: 2017-08-11 | Disposition: A | Discharge status: Home / Self Care | Payer: Medicare Other | Attending: Family Medicine | Admitting: Family Medicine

## 2017-08-11 DIAGNOSIS — S39012A Strain of muscle, fascia and tendon of lower back, initial encounter: Secondary | ICD-10-CM | POA: Diagnosis not present

## 2017-08-11 DIAGNOSIS — W57XXXA Bitten or stung by nonvenomous insect and other nonvenomous arthropods, initial encounter: Secondary | ICD-10-CM | POA: Diagnosis not present

## 2017-08-11 DIAGNOSIS — S80861A Insect bite (nonvenomous), right lower leg, initial encounter: Secondary | ICD-10-CM

## 2017-08-11 MED ORDER — METHYLPREDNISOLONE 4 MG PO TABS
4.0000 mg | ORAL_TABLET | Freq: Two times a day (BID) | ORAL | 1 refills | Status: DC
Start: 2017-08-11 — End: 2018-01-14

## 2017-08-11 MED ORDER — MUPIROCIN 2 % EX OINT: 1.0000 "application " | TOPICAL_OINTMENT | Freq: Three times a day (TID) | CUTANEOUS | 1 refills | Status: DC

## 2017-08-11 NOTE — ED Triage Notes (Signed)
C/O flare-up of chronic back pain; trying to make appt for ESI. Also c/o insect bite to RLE yesterday; now area red with blisters.

## 2017-08-11 NOTE — ED Provider Notes (Signed)
[place] [facility]   [id number] 08/11/17 Arrival Time: 8546   SUBJECTIVE:  Kathryn [name] is a 77 y.o. female who presents to the urgent care with complaint of flare-up of chronic back pain; trying to make appt for her primary care doctor, Dr. Jacelyn Grip.  In the past she has been diagnosed with bulging disks and has been treated with injections.  She has not been able to get through to her doctor this week.  The pain is been worse for about a week.  There is some radiation into the upper thighs but no weakness, no fever, no urinary incontinence Also c/o insect bite to RLE yesterday; now area red with blisters.  She did not see the insect that bit her.  She has burning just below the right knee where she was noticing the rash.  Past Medical History:  Diagnosis Date  . Anxiety    takes Xanax bid prn  . Bruises easily    pt is on Plavix  . Chronic back pain    buldging disc  . Chronic neck pain   . Coronary artery disease    1 stent   . Dysphagia   . Headache(784.0)    related to cervical issues  . History of colonic polyps   . Hyperlipidemia    takes Crestor daily  . Hypertension    takes Coreg and Diovan daily  . Osteoporosis    was taking shot 2xyr;but not taking anymore  . Peripheral vascular disease ([place])   . Seasonal allergies    takes Zyrtec nightly   Family History  Problem Relation Age of Onset  . Heart disease Mother        Heart Dissease before age 92  . Heart attack Mother   . Cancer Father   . Cancer Sister   . Leukemia Sister   . Cancer Brother   . Heart disease Brother        Amputation  . Heart attack Brother   . Heart disease Brother   . Heart attack Brother   . Anesthesia problems Neg Hx   . Hypotension Neg Hx   . Malignant hyperthermia Neg Hx   . Pseudochol deficiency Neg Hx    Social History   Socioeconomic History  . Marital status: Married    Spouse name: Not on file  . Number of children: Not on file  . Years of education: Not on  file  . Highest education level: Not on file  Occupational History  . Not on file  Social Needs  . Financial resource strain: Not on file  . Food insecurity:    Worry: Not on file    Inability: Not on file  . Transportation needs:    Medical: Not on file    Non-medical: Not on file  Tobacco Use  . Smoking status: Former Research scientist (life sciences)  . Smokeless tobacco: Never Used  Substance and Sexual Activity  . Alcohol use: No  . Drug use: No  . Sexual activity: Yes  Lifestyle  . Physical activity:    Days per week: Not on file    Minutes per session: Not on file  . Stress: Not on file  Relationships  . Social connections:    Talks on phone: Not on file    Gets together: Not on file    Attends religious service: Not on file    Active member of club or organization: Not on file    Attends meetings of clubs or organizations: Not on  file    Relationship status: Not on file  . Intimate partner violence:    Fear of current or ex partner: Not on file    Emotionally abused: Not on file    Physically abused: Not on file    Forced sexual activity: Not on file  Other Topics Concern  . Not on file  Social History Narrative  . Not on file   Current Meds  Medication Sig  . ALPRAZolam (XANAX) 0.25 MG tablet Take 0.25 mg by mouth 2 (two) times daily as needed for anxiety.   [name] amLODipine (NORVASC) 10 MG tablet Take 1 tablet by mouth every evening.   . carvedilol (COREG) 6.25 MG tablet Take 6.25 mg by mouth 2 (two) times daily with a meal.   . cetirizine (ZYRTEC) 10 MG tablet Take 10 mg by mouth at bedtime.  . gabapentin (NEURONTIN) 600 MG tablet Take 1 tablet by mouth 2 (two) times daily.   . Investigational - Study Medication Take 1 tablet by mouth daily. Study name:008 Additional study details:For cholesterol (Dr. Skip Estimable)  . Magnesium 500 MG TABS Take 1 tablet by mouth daily.  . Multiple Vitamin (MULTIVITAMIN WITH MINERALS) TABS Take 1 tablet by mouth daily.  [name] tiZANidine (ZANAFLEX) 4 MG tablet  Take 1 tablet by mouth 2 (two) times daily.  . valsartan-hydrochlorothiazide (DIOVAN-HCT) 160-12.5 MG per tablet Take 1 tablet by mouth at bedtime.   Allergies  Allergen Reactions  . Codeine Hypertension      ROS: As per HPI, remainder of ROS negative.   OBJECTIVE:   Vitals:   08/11/17 1536 08/11/17 1538  BP:  137/60  Pulse: 85   Resp: 18   Temp: 98.2 F (36.8 C)   TempSrc: Oral   SpO2: 99%      General appearance: alert; no distress Eyes: PERRL; EOMI; conjunctiva normal HENT: normocephalic; atraumatic; ; oral mucosa normal Neck: supple Back: no CVA tenderness; neg SLR Extremities: no cyanosis or edema; symmetrical with no gross deformities Skin: warm and dry; blistering 1-2 cm irreg erythema with several 1 mm vesicles within the confluent erythema. Neurologic: normal gait; grossly normal Psychological: alert and cooperative; normal mood and affect      Labs:  Results for orders placed or performed during the hospital encounter of 08/01/17  CBC with Differential  Result Value Ref Range   WBC 12.7 (H) 4.0 - 10.5 K/uL   RBC 4.79 3.87 - 5.11 MIL/uL   Hemoglobin 11.1 (L) 12.0 - 15.0 g/dL   HCT 36.4 36.0 - 46.0 %   MCV 76.0 (L) 78.0 - 100.0 fL   MCH 23.2 (L) 26.0 - 34.0 pg   MCHC 30.5 30.0 - 36.0 g/dL   RDW 14.5 11.5 - 15.5 %   Platelets 405 (H) 150 - 400 K/uL   Neutrophils Relative % 66 %   Neutro Abs 8.3 (H) 1.7 - 7.7 K/uL   Lymphocytes Relative 21 %   Lymphs Abs 2.7 0.7 - 4.0 K/uL   Monocytes Relative 11 %   Monocytes Absolute 1.4 (H) 0.1 - 1.0 K/uL   Eosinophils Relative 2 %   Eosinophils Absolute 0.2 0.0 - 0.7 K/uL   Basophils Relative 0 %   Basophils Absolute 0.0 0.0 - 0.1 K/uL  Basic metabolic panel  Result Value Ref Range   Sodium 138 135 - 145 mmol/L   Potassium 5.1 3.5 - 5.1 mmol/L   Chloride 101 101 - 111 mmol/L   CO2 29 22 - 32 mmol/L   Glucose,  Bld 118 (H) 65 - 99 mg/dL   BUN 18 6 - 20 mg/dL   Creatinine, Ser 1.78 (H) 0.44 - 1.00 mg/dL     Calcium 8.9 8.9 - 10.3 mg/dL   GFR calc non Af Amer 26 (L) >60 mL/min   GFR calc Af Amer 30 (L) >60 mL/min   Anion gap 8 5 - 15  Troponin I  Result Value Ref Range   Troponin I <0.03 <0.03 ng/mL  CBG monitoring, ED  Result Value Ref Range   Glucose-Capillary 99 65 - 99 mg/dL   Comment 1 Notify RN     Labs Reviewed - No data to display  No results found.     ASSESSMENT & PLAN:  1. Strain of lumbar region, initial encounter   2. Insect bite of right lower leg, initial encounter   Keep trying to make an appointment with your back doctor.  Meds ordered this encounter  Medications  . mupirocin ointment (BACTROBAN) 2 %    Sig: Apply 1 application topically 3 (three) times daily.    Dispense:  22 g    Refill:  1  . methylPREDNISolone (MEDROL) 4 MG tablet    Sig: Take 1 tablet (4 mg total) by mouth 2 (two) times daily.    Dispense:  10 tablet    Refill:  1    Reviewed expectations re: course of current medical issues. Questions answered. Outlined signs and symptoms indicating need for more acute intervention. Patient verbalized understanding. After Visit Summary given.    Procedures:      Robyn Haber, MD 08/11/17 1601

## 2017-08-11 NOTE — Discharge Instructions (Addendum)
Keep trying to make an appointment with your back doctor.

## 2017-08-28 ENCOUNTER — Other Ambulatory Visit (HOSPITAL_COMMUNITY): Payer: Self-pay | Admitting: Internal Medicine

## 2017-08-28 DIAGNOSIS — Z1231 Encounter for screening mammogram for malignant neoplasm of breast: Secondary | ICD-10-CM

## 2017-09-28 ENCOUNTER — Other Ambulatory Visit: Payer: Self-pay | Admitting: Internal Medicine

## 2017-09-28 ENCOUNTER — Ambulatory Visit (HOSPITAL_COMMUNITY): Payer: Medicare Other

## 2017-09-28 ENCOUNTER — Ambulatory Visit
Admission: RE | Admit: 2017-09-28 | Discharge: 2017-09-28 | Disposition: A | Discharge status: Home / Self Care | Payer: Medicare Other | Source: Ambulatory Visit | Attending: Internal Medicine | Admitting: Internal Medicine

## 2017-09-28 ENCOUNTER — Ambulatory Visit (HOSPITAL_COMMUNITY)
Admission: RE | Admit: 2017-09-28 | Discharge: 2017-09-28 | Disposition: A | Discharge status: Home / Self Care | Payer: Medicare Other | Source: Ambulatory Visit | Attending: Internal Medicine | Admitting: Internal Medicine

## 2017-09-28 DIAGNOSIS — M898X6 Other specified disorders of bone, lower leg: Secondary | ICD-10-CM

## 2017-09-28 DIAGNOSIS — M25511 Pain in right shoulder: Secondary | ICD-10-CM

## 2017-09-28 DIAGNOSIS — Z1231 Encounter for screening mammogram for malignant neoplasm of breast: Secondary | ICD-10-CM | POA: Diagnosis present

## 2017-11-09 DIAGNOSIS — Z9181 History of falling: Secondary | ICD-10-CM

## 2017-11-09 DIAGNOSIS — N182 Chronic kidney disease, stage 2 (mild): Secondary | ICD-10-CM | POA: Diagnosis not present

## 2017-12-04 ENCOUNTER — Other Ambulatory Visit: Payer: Self-pay | Admitting: Internal Medicine

## 2017-12-18 ENCOUNTER — Encounter: Payer: Self-pay | Admitting: Internal Medicine

## 2017-12-21 ENCOUNTER — Encounter: Payer: Self-pay | Admitting: Internal Medicine

## 2017-12-21 ENCOUNTER — Ambulatory Visit (INDEPENDENT_AMBULATORY_CARE_PROVIDER_SITE_OTHER): Payer: Medicare Other | Admitting: Internal Medicine

## 2017-12-21 VITALS — BP 126/88 | HR 68 | Temp 97.7°F | Ht 63.0 in | Wt 236.0 lb

## 2017-12-21 DIAGNOSIS — Z23 Encounter for immunization: Secondary | ICD-10-CM | POA: Diagnosis not present

## 2017-12-21 DIAGNOSIS — Z0000 Encounter for general adult medical examination without abnormal findings: Secondary | ICD-10-CM

## 2017-12-21 DIAGNOSIS — R7309 Other abnormal glucose: Secondary | ICD-10-CM | POA: Diagnosis not present

## 2017-12-21 DIAGNOSIS — I129 Hypertensive chronic kidney disease with stage 1 through stage 4 chronic kidney disease, or unspecified chronic kidney disease: Secondary | ICD-10-CM

## 2017-12-21 DIAGNOSIS — N182 Chronic kidney disease, stage 2 (mild): Secondary | ICD-10-CM

## 2017-12-21 DIAGNOSIS — B0229 Other postherpetic nervous system involvement: Secondary | ICD-10-CM | POA: Diagnosis not present

## 2017-12-21 LAB — CBC
HEMATOCRIT: 35.1 % (ref 34.0–46.6)
HEMOGLOBIN: 10.8 g/dL — AB (ref 11.1–15.9)
MCH: 22.7 pg — AB (ref 26.6–33.0)
MCHC: 30.8 g/dL — ABNORMAL LOW (ref 31.5–35.7)
MCV: 74 fL — AB (ref 79–97)
PLATELETS: 388 10*3/uL (ref 150–450)
RBC: 4.76 x10E6/uL (ref 3.77–5.28)
RDW: 14.5 % (ref 12.3–15.4)
WBC: 8.3 10*3/uL (ref 3.4–10.8)

## 2017-12-21 LAB — HEMOGLOBIN A1C
ESTIMATED AVERAGE GLUCOSE: 114 mg/dL
HEMOGLOBIN A1C: 5.6 % (ref 4.8–5.6)

## 2017-12-21 LAB — CMP14+EGFR
ALK PHOS: 65 IU/L (ref 39–117)
ALT: 10 IU/L (ref 0–32)
AST: 20 IU/L (ref 0–40)
Albumin/Globulin Ratio: 1.8 (ref 1.2–2.2)
Albumin: 4.5 g/dL (ref 3.5–4.8)
BUN/Creatinine Ratio: 22 (ref 12–28)
BUN: 42 mg/dL — AB (ref 8–27)
Bilirubin Total: 0.3 mg/dL (ref 0.0–1.2)
CALCIUM: 10.2 mg/dL (ref 8.7–10.3)
CO2: 22 mmol/L (ref 20–29)
CREATININE: 1.9 mg/dL — AB (ref 0.57–1.00)
Chloride: 99 mmol/L (ref 96–106)
GFR calc Af Amer: 29 mL/min/{1.73_m2} — ABNORMAL LOW (ref >59)
GFR, EST NON AFRICAN AMERICAN: 25 mL/min/{1.73_m2} — AB (ref >59)
GLOBULIN, TOTAL: 2.5 g/dL (ref 1.5–4.5)
GLUCOSE: 86 mg/dL (ref 65–99)
Potassium: 4.2 mmol/L (ref 3.5–5.2)
Sodium: 139 mmol/L (ref 134–144)
Total Protein: 7 g/dL (ref 6.0–8.5)

## 2017-12-21 LAB — LIPID PANEL
CHOL/HDL RATIO: 3.2 ratio (ref 0.0–4.4)
Cholesterol, Total: 174 mg/dL (ref 100–199)
HDL: 54 mg/dL (ref >39)
LDL CALC: 107 mg/dL — AB (ref 0–99)
Triglycerides: 67 mg/dL (ref 0–149)
VLDL CHOLESTEROL CAL: 13 mg/dL (ref 5–40)

## 2017-12-21 LAB — POCT URINALYSIS DIPSTICK
Bilirubin, UA: NEGATIVE
Blood, UA: NEGATIVE
GLUCOSE UA: NEGATIVE
KETONES UA: NEGATIVE
Leukocytes, UA: NEGATIVE
NITRITE UA: NEGATIVE
PROTEIN UA: NEGATIVE
SPEC GRAV UA: 1.01 (ref 1.010–1.025)
Urobilinogen, UA: 0.2 E.U./dL
pH, UA: 5.5 (ref 5.0–8.0)

## 2017-12-21 LAB — POCT UA - MICROALBUMIN
ALBUMIN/CREATININE RATIO, URINE, POC: 30
Creatinine, POC: 100 mg/dL
Microalbumin Ur, POC: 30 mg/L

## 2017-12-21 MED ORDER — TETANUS-DIPHTH-ACELL PERTUSSIS 5-2-15.5 LF-MCG/0.5 IM SUSP: 0.5000 mL | Freq: Once | INTRAMUSCULAR | 0 refills | Status: AC

## 2017-12-21 NOTE — Patient Instructions (Signed)
Postherpetic Neuralgia Postherpetic neuralgia (PHN) is nerve pain that occurs after a shingles infection. Shingles is a painful rash that appears on one side of the body, usually on your trunk or face. Shingles is caused by the varicella-zoster virus. This is the same virus that causes chickenpox. In people who have had chickenpox, the virus can resurface years later and cause shingles. You may have PHN if you continue to have pain for 3 months after your shingles rash has gone away. PHN appears in the same area where you had the shingles rash. For most people, PHN goes away within 1 year. Getting a vaccination for shingles can prevent PHN. This vaccine is recommended for people older than 50. It may prevent shingles and may also lower your risk of PHN if you do get shingles. What are the causes? PHN is caused by damage to your nerves from the varicella-zoster virus. This damage makes your nerves overly sensitive. What increases the risk? Aging is the biggest risk factor for developing PHN. Most people who get PHN are older than 60. Other risk factors include:  Having very bad pain before your shingles rash starts.  Having a very bad rash.  Having shingles in the nerve that supplies your face and eye (trigeminal nerve).  What are the signs or symptoms? Pain is the main symptom of PHN. The pain is often very bad and may be described as stabbing, burning, or feeling like an electric shock. The pain may come and go or may be there all the time. Pain may be triggered by light touches on the skin or changes in temperature. You may have itching along with the pain. How is this diagnosed? Your health care provider may diagnose PHN based on your symptoms and your history of shingles. Lab studies and other diagnostic tests are usually not needed. How is this treated? There is no cure for PHN. Treatment for PHN will focus on pain relief. Over-the-counter pain relievers do not usually relieve PHN pain. You  may need to work with a pain specialist. Treatment may include:  Antidepressant medicines to help with pain and improve sleep.  Antiseizure medicines to relieve nerve pain.  Strong pain relievers (opioids).  A numbing patch worn on the skin (lidocaine patch).  Follow these instructions at home: It may take a long time to recover from PHN. Work closely with your health care provider, and have a good support system at home.  Take all medicines as directed by your health care provider.  Wear loose, comfortable clothing.  Cover sensitive areas with a dressing to reduce friction from clothing rubbing on the area.  If cold does not make your pain worse, try applying a cool compress or cooling gel pack to the area.  Talk to your health care provider if you feel depressed or desperate. Living with long-term pain can be depressing.  Contact a health care provider if:  Your medicine is not helping.  You are struggling to manage your pain at home. This information is not intended to replace advice given to you by your health care provider. Make sure you discuss any questions you have with your health care provider. Document Released: 04/23/2002 Document Revised: 07/09/2015 Document Reviewed: 01/22/2013 Elsevier Interactive Patient Education  2018 [employer]vier Inc.  

## 2017-12-24 NOTE — Progress Notes (Signed)
Your kidney fxn is stable from last visit. However, this is in stage 3 CKD. Pls incresae hydration. This is likely contributing to your fatigue. Is she willing to see kidney specialist? If no, is she wiling to see one? Liver fxn is nl. She is slightly anemic, possibly related to decreased kidney fxn. Chol is pretty good. LDL is 107, should be less than 100. Please increase exercise to 5 days weekly for at least 30 min per day. Your a1c is 5.6, this is great.

## 2017-12-25 ENCOUNTER — Other Ambulatory Visit: Payer: Self-pay | Admitting: Internal Medicine

## 2017-12-25 DIAGNOSIS — I129 Hypertensive chronic kidney disease with stage 1 through stage 4 chronic kidney disease, or unspecified chronic kidney disease: Secondary | ICD-10-CM

## 2017-12-25 DIAGNOSIS — N183 Chronic kidney disease, stage 3 unspecified: Secondary | ICD-10-CM

## 2017-12-25 NOTE — Progress Notes (Signed)
Subjective:     Patient ID: Kathryn [name] , female    DOB: 09/01/1940 , 77 y.o.   MRN: [id number]   Chief Complaint  Patient presents with  . Annual Exam  . Hypertension    HPI  She is here today for a full physical examination. She has no specific concerns or complaints at this time.   Hypertension  This is a chronic problem. The current episode started more than 1 year ago. The problem has been gradually improving since onset. The problem is controlled. Pertinent negatives include no blurred vision, chest pain or palpitations. Risk factors for coronary artery disease include dyslipidemia, obesity, sedentary lifestyle, post-menopausal state and stress. The current treatment provides moderate improvement. Compliance problems include exercise.    Reports compliance with meds.   Past Medical History:  Diagnosis Date  . Anxiety    takes Xanax bid prn  . Bruises easily    pt is on Plavix  . Chronic back pain    buldging disc  . Chronic neck pain   . Coronary artery disease    1 stent   . Dysphagia   . Headache(784.0)    related to cervical issues  . History of colonic polyps   . Hyperlipidemia    takes Crestor daily  . Hypertension    takes Coreg and Diovan daily  . Osteoporosis    was taking shot 2xyr;but not taking anymore  . Peripheral vascular disease ([place])   . Seasonal allergies    takes Zyrtec nightly     Family History  Problem Relation Age of Onset  . Heart disease Mother        Heart Dissease before age 61  . Heart attack Mother   . Cancer Father   . Cancer Sister   . Leukemia Sister   . Cancer Brother   . Heart disease Brother        Amputation  . Heart attack Brother   . Heart disease Brother   . Heart attack Brother   . Anesthesia problems Neg Hx   . Hypotension Neg Hx   . Malignant hyperthermia Neg Hx   . Pseudochol deficiency Neg Hx     No current facility-administered medications for this visit.  No current outpatient medications on  file.  Facility-Administered Medications Ordered in Other Visits:  .  0.9 %  sodium chloride infusion, , Intravenous, Continuous, Madelon Lips, MD, Last Rate: 100 mL/hr at 01/15/18 1745 .  acetaminophen (TYLENOL) tablet 650 mg, 650 mg, Oral, Q4H PRN, Patwardhan, Manish J, MD, 650 mg at 01/15/18 1334 .  aspirin EC tablet 81 mg, 81 mg, Oral, Daily, Patwardhan, Manish J, MD, 81 mg at 01/15/18 0932 .  carvedilol (COREG) tablet 6.25 mg, 6.25 mg, Oral, BID WC, Patwardhan, Manish J, MD, 6.25 mg at 01/15/18 1739 .  [COMPLETED] heparin bolus via infusion 4,000 Units, 4,000 Units, Intravenous, Once, 4,000 Units at 01/14/18 1502 **FOLLOWED BY** heparin ADULT infusion 100 units/mL (25000 units/214m sodium chloride 0.45%), 1,000 Units/hr, Intravenous, Continuous, [facility] Last Rate: 10 mL/hr at 01/15/18 0437, 1,000 Units/hr at 01/15/18 0437 .  multivitamin with minerals tablet 1 tablet, 1 tablet, Oral, Daily, Patwardhan, Manish J, MD, 1 tablet at 01/15/18 0932 .  nitroGLYCERIN (NITROSTAT) SL tablet 0.4 mg, 0.4 mg, Sublingual, Q5 Min x 3 PRN, Patwardhan, Manish J, MD .  ondansetron (ZOFRAN) injection 4 mg, 4 mg, Intravenous, Q6H PRN, Patwardhan, Manish J, MD .  tiZANidine (ZANAFLEX) tablet 4 mg, 4 mg,  Oral, BID, Patwardhan, Manish J, MD, 4 mg at 01/15/18 0932   Allergies  Allergen Reactions  . Codeine Hypertension  . Shellfish Allergy      No LMP recorded. Patient has had a hysterectomy..  Negative for: breast discharge, breast lump(s), breast pain and breast self exam. Associated symptoms include abnormal vaginal bleeding. Pertinent negatives include abnormal bleeding (hematology), anxiety, decreased libido, depression, difficulty falling sleep, dyspareunia, history of infertility, nocturia, sexual dysfunction, sleep disturbances, urinary incontinence, urinary urgency, vaginal discharge and vaginal itching. Diet regular.The patient states her exercise level is  minimal.   . The patient's  tobacco use is:  Social History   Tobacco Use  Smoking Status Former Smoker  . Years: 5.00  Smokeless Tobacco Never Used  . She has been exposed to passive smoke. The patient's alcohol use is:  Social History   Substance and Sexual Activity  Alcohol Use No   Review of Systems  Constitutional: Negative.   HENT: Negative.   Eyes: Negative.  Negative for blurred vision.  Respiratory: Negative.   Cardiovascular: Negative.  Negative for chest pain and palpitations.  Gastrointestinal: Negative.   Endocrine: Negative.   Genitourinary: Negative.   Allergic/Immunologic: Negative.   Neurological: Negative.   Hematological: Negative.   Psychiatric/Behavioral: Negative.      Today's Vitals   12/21/17 1012  BP: 126/88  Pulse: 68  Temp: 97.7 F (36.5 C)  TempSrc: Oral  Weight: 236 lb (107 kg)  Height: _0  (1.6 m)  PainSc: 6   PainLoc: Back   Body mass index is 41.81 kg/m.   Objective:  Physical Exam  Constitutional: She is oriented to person, place, and time. She appears well-developed and well-nourished.  HENT:  Head: Normocephalic and atraumatic.  Right Ear: External ear normal.  Left Ear: External ear normal.  Nose: Nose normal.  Mouth/Throat: Oropharynx is clear and moist.  Eyes: Pupils are equal, round, and reactive to light. Conjunctivae and EOM are normal.  Neck: Normal range of motion. Neck supple.  Cardiovascular: Normal rate, regular rhythm, normal heart sounds and intact distal pulses.  Pulmonary/Chest: Effort normal and breath sounds normal. Right breast exhibits no inverted nipple, no mass, no nipple discharge, no skin change and no tenderness. Left breast exhibits no inverted nipple, no mass, no nipple discharge, no skin change and no tenderness.  Abdominal: Soft. Bowel sounds are normal.  obese  Genitourinary:  Genitourinary Comments: deferred  Musculoskeletal: Normal range of motion.  Neurological: She is alert and oriented to person, place, and time.   Skin: Skin is warm and dry.  Psychiatric: She has a normal mood and affect. Her behavior is normal.  Nursing note and vitals reviewed.       Assessment And Plan:     1. Routine general medical examination at health care facility  A full exam was performed. Importance of monthly self breast exams was discussed with the patient.  PATIENT HAS BEEN ADVISED TO GET 30-45 MINUTES REGULAR EXERCISE NO LESS THAN FOUR TO FIVE DAYS PER WEEK - BOTH WEIGHTBEARING EXERCISES AND AEROBIC ARE RECOMMENDED.  SHE IS ADVISED TO FOLLOW A HEALTHY DIET WITH AT LEAST SIX FRUITS/VEGGIES PER DAY, DECREASE INTAKE OF RED MEAT, AND TO INCREASE FISH INTAKE TO TWO DAYS PER WEEK.  MEATS/FISH SHOULD NOT BE FRIED, BAKED OR BROILED IS PREFERABLE.  I SUGGEST WEARING SPF 50 SUNSCREEN ON EXPOSED PARTS AND ESPECIALLY WHEN IN THE DIRECT SUNLIGHT FOR AN EXTENDED PERIOD OF TIME.  PLEASE AVOID FAST FOOD RESTAURANTS AND INCREASE YOUR  WATER INTAKE.   2. Hypertensive nephropathy  Fair control. She will continue with current meds. She is encouraged to avoid adding salt to her foods. She will rto in six months for re-evaluation. Importance of regular exercise was discussed with the patient.   - CMP14+EGFR - CBC - Lipid panel - EKG 12-Lead  3. Chronic renal disease, stage II  Chronic. I will check GFR, CR today. She is encouraed to drink no les than six glasses of water daily.   - POCT Urinalysis Dipstick (81002) - POCT UA - Microalbumin  4. Neuralgia, postherpetic  Chronic. She may benefit from gabapentin nightly.   5. Need for vaccination  - Flu vaccine HIGH DOSE PF (Fluzone High dose)  6. Other abnormal glucose  HER A1C HAS BEEN ELEVATED IN THE PAST. I WILL CHECK AN A1C, BMET TODAY. SHE WAS ENCOURAGED TO AVOID SUGARY BEVERAGES AND PROCESSED FOODS INCLUDNG BREADS, RICE AND PASTA.  - Hemoglobin A1c        [name], MD

## 2018-01-01 ENCOUNTER — Other Ambulatory Visit: Payer: Self-pay | Admitting: Nurse Practitioner

## 2018-01-01 DIAGNOSIS — R0989 Other specified symptoms and signs involving the circulatory and respiratory systems: Secondary | ICD-10-CM | POA: Diagnosis not present

## 2018-01-02 ENCOUNTER — Other Ambulatory Visit: Payer: Self-pay

## 2018-01-14 ENCOUNTER — Encounter (HOSPITAL_COMMUNITY): Payer: Self-pay

## 2018-01-14 ENCOUNTER — Emergency Department (HOSPITAL_COMMUNITY): Payer: Medicare Other

## 2018-01-14 ENCOUNTER — Inpatient Hospital Stay (HOSPITAL_COMMUNITY)
Admission: EM | Admit: 2018-01-14 | Discharge: 2018-01-16 | DRG: 281 | Disposition: A | Discharge status: Home / Self Care | Payer: Medicare Other | Attending: Cardiology | Admitting: Cardiology

## 2018-01-14 DIAGNOSIS — I214 Non-ST elevation (NSTEMI) myocardial infarction: Secondary | ICD-10-CM | POA: Diagnosis not present

## 2018-01-14 DIAGNOSIS — Z8719 Personal history of other diseases of the digestive system: Secondary | ICD-10-CM

## 2018-01-14 DIAGNOSIS — I739 Peripheral vascular disease, unspecified: Secondary | ICD-10-CM | POA: Diagnosis present

## 2018-01-14 DIAGNOSIS — I129 Hypertensive chronic kidney disease with stage 1 through stage 4 chronic kidney disease, or unspecified chronic kidney disease: Secondary | ICD-10-CM | POA: Diagnosis not present

## 2018-01-14 DIAGNOSIS — N183 Chronic kidney disease, stage 3 unspecified: Secondary | ICD-10-CM

## 2018-01-14 DIAGNOSIS — I6523 Occlusion and stenosis of bilateral carotid arteries: Secondary | ICD-10-CM | POA: Diagnosis present

## 2018-01-14 DIAGNOSIS — Z885 Allergy status to narcotic agent status: Secondary | ICD-10-CM | POA: Diagnosis not present

## 2018-01-14 DIAGNOSIS — E785 Hyperlipidemia, unspecified: Secondary | ICD-10-CM | POA: Diagnosis not present

## 2018-01-14 DIAGNOSIS — Z91013 Allergy to seafood: Secondary | ICD-10-CM

## 2018-01-14 DIAGNOSIS — E669 Obesity, unspecified: Secondary | ICD-10-CM | POA: Diagnosis present

## 2018-01-14 DIAGNOSIS — N179 Acute kidney failure, unspecified: Secondary | ICD-10-CM | POA: Diagnosis present

## 2018-01-14 DIAGNOSIS — Z8249 Family history of ischemic heart disease and other diseases of the circulatory system: Secondary | ICD-10-CM

## 2018-01-14 DIAGNOSIS — D631 Anemia in chronic kidney disease: Secondary | ICD-10-CM | POA: Diagnosis present

## 2018-01-14 DIAGNOSIS — Z87891 Personal history of nicotine dependence: Secondary | ICD-10-CM | POA: Diagnosis not present

## 2018-01-14 DIAGNOSIS — J302 Other seasonal allergic rhinitis: Secondary | ICD-10-CM | POA: Diagnosis not present

## 2018-01-14 DIAGNOSIS — Z006 Encounter for examination for normal comparison and control in clinical research program: Secondary | ICD-10-CM | POA: Diagnosis not present

## 2018-01-14 DIAGNOSIS — I272 Pulmonary hypertension, unspecified: Secondary | ICD-10-CM | POA: Diagnosis not present

## 2018-01-14 DIAGNOSIS — Z6836 Body mass index (BMI) 36.0-36.9, adult: Secondary | ICD-10-CM

## 2018-01-14 DIAGNOSIS — I209 Angina pectoris, unspecified: Secondary | ICD-10-CM | POA: Diagnosis not present

## 2018-01-14 DIAGNOSIS — G8929 Other chronic pain: Secondary | ICD-10-CM | POA: Diagnosis not present

## 2018-01-14 DIAGNOSIS — Z955 Presence of coronary angioplasty implant and graft: Secondary | ICD-10-CM | POA: Diagnosis not present

## 2018-01-14 DIAGNOSIS — Z90710 Acquired absence of both cervix and uterus: Secondary | ICD-10-CM | POA: Diagnosis not present

## 2018-01-14 DIAGNOSIS — I10 Essential (primary) hypertension: Secondary | ICD-10-CM | POA: Diagnosis not present

## 2018-01-14 DIAGNOSIS — F419 Anxiety disorder, unspecified: Secondary | ICD-10-CM | POA: Diagnosis present

## 2018-01-14 DIAGNOSIS — R42 Dizziness and giddiness: Secondary | ICD-10-CM | POA: Diagnosis not present

## 2018-01-14 DIAGNOSIS — M810 Age-related osteoporosis without current pathological fracture: Secondary | ICD-10-CM | POA: Diagnosis not present

## 2018-01-14 DIAGNOSIS — R0789 Other chest pain: Secondary | ICD-10-CM | POA: Diagnosis not present

## 2018-01-14 DIAGNOSIS — Z9049 Acquired absence of other specified parts of digestive tract: Secondary | ICD-10-CM | POA: Diagnosis not present

## 2018-01-14 DIAGNOSIS — I2510 Atherosclerotic heart disease of native coronary artery without angina pectoris: Secondary | ICD-10-CM | POA: Diagnosis not present

## 2018-01-14 LAB — URINALYSIS, ROUTINE W REFLEX MICROSCOPIC
Bilirubin Urine: NEGATIVE
Glucose, UA: NEGATIVE mg/dL
Ketones, ur: NEGATIVE mg/dL
LEUKOCYTES UA: NEGATIVE
Nitrite: NEGATIVE
PROTEIN: NEGATIVE mg/dL
Specific Gravity, Urine: 1.005 (ref 1.005–1.030)
pH: 6 (ref 5.0–8.0)

## 2018-01-14 LAB — CBC
HCT: 38.9 % (ref 36.0–46.0)
Hemoglobin: 11.7 g/dL — ABNORMAL LOW (ref 12.0–15.0)
MCH: 22.2 pg — ABNORMAL LOW (ref 26.0–34.0)
MCHC: 30.1 g/dL (ref 30.0–36.0)
MCV: 74 fL — ABNORMAL LOW (ref 80.0–100.0)
NRBC: 0 % (ref 0.0–0.2)
Platelets: 383 10*3/uL (ref 150–400)
RBC: 5.26 MIL/uL — AB (ref 3.87–5.11)
RDW: 14.9 % (ref 11.5–15.5)
WBC: 8.8 10*3/uL (ref 4.0–10.5)

## 2018-01-14 LAB — TROPONIN I: Troponin I: 0.32 ng/mL (ref <0.03)

## 2018-01-14 LAB — BASIC METABOLIC PANEL
Anion gap: 11 (ref 5–15)
BUN: 35 mg/dL — ABNORMAL HIGH (ref 8–23)
CHLORIDE: 107 mmol/L (ref 98–111)
CO2: 24 mmol/L (ref 22–32)
Calcium: 9.7 mg/dL (ref 8.9–10.3)
Creatinine, Ser: 2.12 mg/dL — ABNORMAL HIGH (ref 0.44–1.00)
GFR calc non Af Amer: 22 mL/min — ABNORMAL LOW (ref >60)
GFR, EST AFRICAN AMERICAN: 25 mL/min — AB (ref >60)
Glucose, Bld: 94 mg/dL (ref 70–99)
Potassium: 3.6 mmol/L (ref 3.5–5.1)
Sodium: 142 mmol/L (ref 135–145)

## 2018-01-14 LAB — IRON AND TIBC
Iron: 63 ug/dL (ref 28–170)
Saturation Ratios: 15 % (ref 10.4–31.8)
TIBC: 410 ug/dL (ref 250–450)
UIBC: 347 ug/dL

## 2018-01-14 LAB — BRAIN NATRIURETIC PEPTIDE: B Natriuretic Peptide: 50.3 pg/mL (ref 0.0–100.0)

## 2018-01-14 LAB — CBG MONITORING, ED: Glucose-Capillary: 96 mg/dL (ref 70–99)

## 2018-01-14 MED ORDER — SODIUM CHLORIDE 0.9 % IV SOLN
INTRAVENOUS | Status: AC
  Administered 2018-01-14: 19:00:00 via INTRAVENOUS

## 2018-01-14 MED ORDER — ACETAMINOPHEN 325 MG PO TABS
650.0000 mg | ORAL_TABLET | ORAL | Status: DC | PRN
  Administered 2018-01-15: 650 mg via ORAL
  Filled 2018-01-14: qty 2

## 2018-01-14 MED ORDER — TIZANIDINE HCL 4 MG PO TABS
4.0000 mg | ORAL_TABLET | Freq: Two times a day (BID) | ORAL | Status: DC
  Administered 2018-01-14 – 2018-01-16 (×4): 4 mg via ORAL
  Filled 2018-01-14 (×5): qty 1

## 2018-01-14 MED ORDER — CARVEDILOL 6.25 MG PO TABS
6.2500 mg | ORAL_TABLET | Freq: Two times a day (BID) | ORAL | Status: DC
  Administered 2018-01-14 – 2018-01-15 (×3): 6.25 mg via ORAL
  Filled 2018-01-14 (×3): qty 1

## 2018-01-14 MED ORDER — ASPIRIN 81 MG PO CHEW
324.0000 mg | CHEWABLE_TABLET | ORAL | Status: AC
  Filled 2018-01-14: qty 4

## 2018-01-14 MED ORDER — CARVEDILOL 6.25 MG PO TABS: 6.2500 mg | ORAL_TABLET | Freq: Two times a day (BID) | ORAL | Status: DC

## 2018-01-14 MED ORDER — NITROGLYCERIN 0.4 MG SL SUBL: 0.4000 mg | SUBLINGUAL_TABLET | SUBLINGUAL | Status: DC | PRN

## 2018-01-14 MED ORDER — ASPIRIN 81 MG PO CHEW
324.0000 mg | CHEWABLE_TABLET | Freq: Once | ORAL | Status: AC
  Administered 2018-01-14: 324 mg via ORAL
  Filled 2018-01-14: qty 4

## 2018-01-14 MED ORDER — ADULT MULTIVITAMIN W/MINERALS CH
1.0000 | ORAL_TABLET | Freq: Every day | ORAL | Status: DC
  Administered 2018-01-15 – 2018-01-16 (×2): 1 via ORAL
  Filled 2018-01-14 (×2): qty 1

## 2018-01-14 MED ORDER — HEPARIN BOLUS VIA INFUSION
4000.0000 [IU] | Freq: Once | INTRAVENOUS | Status: AC
  Administered 2018-01-14: 4000 [IU] via INTRAVENOUS
  Filled 2018-01-14: qty 4000

## 2018-01-14 MED ORDER — HEPARIN (PORCINE) 25000 UT/250ML-% IV SOLN
1000.0000 [IU]/h | INTRAVENOUS | Status: DC
  Administered 2018-01-14 – 2018-01-16 (×3): 1000 [IU]/h via INTRAVENOUS
  Filled 2018-01-14 (×3): qty 250

## 2018-01-14 MED ORDER — ASPIRIN EC 81 MG PO TBEC
81.0000 mg | DELAYED_RELEASE_TABLET | Freq: Every day | ORAL | Status: DC
  Administered 2018-01-15: 81 mg via ORAL
  Filled 2018-01-14: qty 1

## 2018-01-14 MED ORDER — ASPIRIN 300 MG RE SUPP: 300.0000 mg | RECTAL | Status: AC

## 2018-01-14 MED ORDER — ONDANSETRON HCL 4 MG/2ML IJ SOLN: 4.0000 mg | Freq: Four times a day (QID) | INTRAMUSCULAR | Status: DC | PRN

## 2018-01-14 NOTE — Progress Notes (Signed)
ANTICOAGULATION CONSULT NOTE - Initial Consult  Pharmacy Consult for Heparin Indication: chest pain/ACS  Allergies  Allergen Reactions  . Codeine Hypertension  . Shellfish Allergy     Patient Measurements:   Heparin Dosing Weight: 78kg (using ActBW 107kg from 12/21/17)  Vital Signs: Temp: 98.2 F (36.8 C) (12/01 1248) Temp Source: Oral (12/01 1248) BP: 137/70 (12/01 1248) Pulse Rate: 82 (12/01 1248)  Labs: Recent Labs    01/14/18 1314 01/14/18 1315  HGB 11.7*  --   HCT 38.9  --   PLT 383  --   CREATININE 2.12*  --   TROPONINI  --  0.32*    CrCl cannot be calculated (Unknown ideal weight.).   Medical History: Past Medical History:  Diagnosis Date  . Anxiety    takes Xanax bid prn  . Bruises easily    pt is on Plavix  . Chronic back pain    buldging disc  . Chronic neck pain   . Coronary artery disease    1 stent   . Dysphagia   . Headache(784.0)    related to cervical issues  . History of colonic polyps   . Hyperlipidemia    takes Crestor daily  . Hypertension    takes Coreg and Diovan daily  . Osteoporosis    was taking shot 2xyr;but not taking anymore  . Peripheral vascular disease ([place])   . Seasonal allergies    takes Zyrtec nightly    Medications:  No meds for anticoagulation PTA  Assessment: 77 yo F chest pain.  Initial troponin elevated, EKG NSR.    CBC- Hg slightly low but appears to be at patient's baseline, pltc WNL. PMH significant for CKD.  No bleeding reported.  Pharmacy consulted to start heparin infusion.   Goal of Therapy:  Heparin level 0.3-0.7 units/ml Monitor platelets by anticoagulation protocol: Yes   Plan:  Give 4000 units bolus x 1 Start heparin infusion at 1000 units/hr Check anti-Xa level in 8 hours and daily while on heparin Continue to monitor H&H and platelets  Brendy Ficek, Lavonia Drafts 01/14/2018,2:25 PM

## 2018-01-14 NOTE — ED Notes (Signed)
Date and time results received: 01/14/18 1410 (use smartphrase ".now" to insert current time)  Test: Troponin Critical Value: 0.32  Name of Provider Notified: Dr. Laverta Baltimore  Orders Received? Or Actions Taken?:

## 2018-01-14 NOTE — ED Provider Notes (Signed)
Emergency Department Provider Note   I have reviewed the triage vital signs and the nursing notes.   HISTORY  Chief Complaint Dizziness and chest tighness   HPI Kathryn [name] is a 77 y.o. female with PMH of CAD, HLD, HTN, PVD, and CKD followed by Dr. Nadyne Coombes with Cardiology resents to the emergency department for evaluation of exertional dyspnea over the past week but new onset chest tightness radiating down the left arm which occurred last night.  Patient states that the symptoms began yesterday evening in terms of the chest pain.  She initially felt like she needed to come to the emergency department but held off because symptoms resolved.  Shortness of breath is mainly with exertion and relieved with rest.  She is not having active chest pain.  The patient's husband called their daughter who came over to check on her today.  The daughter states that patient appeared diaphoretic and somewhat uncomfortable when she saw her so insisted she come to the emergency department.  She denies any fevers or chills.  Patient states that her primary care doctor has been following her kidney function as well as iron levels and is wondering if this could be causing any of her symptoms.  She does have past history of PCI and has been compliant with her aspirin.   Past Medical History:  Diagnosis Date  . Anxiety    takes Xanax bid prn  . Bruises easily    pt is on Plavix  . Chronic back pain    buldging disc  . Chronic neck pain   . Coronary artery disease    1 stent   . Dysphagia   . Headache(784.0)    related to cervical issues  . History of colonic polyps   . Hyperlipidemia    takes Crestor daily  . Hypertension    takes Coreg and Diovan daily  . Osteoporosis    was taking shot 2xyr;but not taking anymore  . Peripheral vascular disease ([place])   . Seasonal allergies    takes Zyrtec nightly    Patient Active Problem List   Diagnosis Date Noted  . NSTEMI (non-ST elevated myocardial  infarction) ([place]) 01/14/2018  . 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 ABDOMINAL HYSTERECTOMY  70's  . BUNIONECTOMY     left foot  . CARDIAC CATHETERIZATION  2009/2011   1 stent placed  . CHOLECYSTECTOMY  2009  . COLONOSCOPY    . ESOPHAGOGASTRODUODENOSCOPY    . TONSILLECTOMY     at age 75   Allergies Codeine and Shellfish allergy  Family History  Problem Relation Age of Onset  . Heart disease Mother        Heart Dissease before age 72  . Heart attack Mother   . Cancer Father   . Cancer Sister   . Leukemia Sister   . Cancer Brother   . Heart disease Brother        Amputation  . Heart attack Brother   . Heart disease Brother   . Heart attack Brother   . Anesthesia problems Neg Hx   . Hypotension Neg Hx   . Malignant hyperthermia Neg Hx   . Pseudochol deficiency Neg Hx     Social History Social History   Tobacco Use  . Smoking status: Former Smoker    Years: 5.00  . Smokeless tobacco: Never Used  Substance Use Topics  .  Alcohol use: No  . Drug use: No    Review of Systems  Constitutional: No fever/chills Eyes: No visual changes. ENT: No sore throat. Cardiovascular: Positive chest pain. Positive diaphoresis.  Respiratory: Positive exertional shortness of breath. Gastrointestinal: No abdominal pain.  No nausea, no vomiting.  No diarrhea.  No constipation. Genitourinary: Negative for dysuria. Musculoskeletal: Negative for back pain. Skin: Negative for rash. Neurological: Negative for headaches, focal weakness or numbness.  10-point ROS otherwise negative.  ____________________________________________   PHYSICAL EXAM:  VITAL SIGNS: ED Triage Vitals  Enc Vitals Group     BP 01/14/18 1248 137/70     Pulse Rate 01/14/18 1248 82     Resp 01/14/18 1248 14     Temp 01/14/18 1248 98.2 F (36.8 C)     Temp Source 01/14/18 1248 Oral     SpO2  01/14/18 1248 93 %     Pain Score 01/14/18 1249 0   Constitutional: Alert and oriented. Well appearing and in no acute distress. Eyes: Conjunctivae are normal.  Head: Atraumatic. Nose: No congestion/rhinnorhea. Mouth/Throat: Mucous membranes are moist.  Neck: No stridor.  Cardiovascular: Normal rate, regular rhythm. Good peripheral circulation. Grossly normal heart sounds.   Respiratory: Normal respiratory effort.  No retractions. Lungs CTAB. Gastrointestinal: Soft and nontender. No distention.  Musculoskeletal: No lower extremity tenderness nor edema. No gross deformities of extremities. Neurologic:  Normal speech and language. No gross focal neurologic deficits are appreciated.  Skin:  Skin is warm, dry and intact. No rash noted.  ____________________________________________   LABS (all labs ordered are listed, but only abnormal results are displayed)  Labs Reviewed  BASIC METABOLIC PANEL - Abnormal; Notable for the following components:      Result Value   BUN 35 (*)    Creatinine, Ser 2.12 (*)    GFR calc non Af Amer 22 (*)    GFR calc Af Amer 25 (*)    All other components within normal limits  CBC - Abnormal; Notable for the following components:   RBC 5.26 (*)    Hemoglobin 11.7 (*)    MCV 74.0 (*)    MCH 22.2 (*)    All other components within normal limits  TROPONIN I - Abnormal; Notable for the following components:   Troponin I 0.32 (*)    All other components within normal limits  BRAIN NATRIURETIC PEPTIDE  IRON AND TIBC  URINALYSIS, ROUTINE W REFLEX MICROSCOPIC  HEPARIN LEVEL (UNFRACTIONATED)  CBG MONITORING, ED   ____________________________________________  EKG   EKG Interpretation  Date/Time:  Sunday January 14 2018 12:47:15 EST Ventricular Rate:  86 PR Interval:    QRS Duration: 84 QT Interval:  351 QTC Calculation: 420 R Axis:   5 Text Interpretation:  Sinus rhythm Low voltage, precordial leads No STEMI.  Confirmed by Nanda Quinton [phone]) on  01/14/2018 2:48:36 PM       ____________________________________________  RADIOLOGY  Dg Chest 2 View  Result Date: 01/14/2018 CLINICAL DATA:  Dizziness and weakness 1 week getting worse with left arm tingling beginning last night. EXAM: CHEST - 2 VIEW COMPARISON:  08/01/2017 FINDINGS: Lungs are adequately inflated without focal airspace consolidation or effusion. Mild stable prominence of the main pulmonary artery segment which may indicate a degree of pulmonary arterial hypertension. Cardiomediastinal silhouette and remainder of the exam is unchanged. IMPRESSION: No active cardiopulmonary disease. Electronically Signed   By: Marin Olp M.D.   On: 01/14/2018 14:02    ____________________________________________   PROCEDURES  Procedure(s) performed:   .  Critical Care Performed by: Margette Fast, MD Authorized by: Margette Fast, MD   Critical care provider statement:    Critical care time (minutes):  35   Critical care time was exclusive of:  Separately billable procedures and treating other patients and teaching time   Critical care was necessary to treat or prevent imminent or life-threatening deterioration of the following conditions:  Cardiac failure   Critical care was time spent personally by me on the following activities:  Blood draw for specimens, development of treatment plan with patient or surrogate, discussions with consultants, evaluation of patient's response to treatment, examination of patient, obtaining history from patient or surrogate, ordering and performing treatments and interventions, ordering and review of laboratory studies, ordering and review of radiographic studies, pulse oximetry, re-evaluation of patient's condition and review of old charts   I assumed direction of critical care for this patient from another provider in my specialty: no     ____________________________________________   INITIAL IMPRESSION / [place] / ED  COURSE  Pertinent labs & imaging results that were available during my care of the patient were reviewed by me and considered in my medical decision making (see chart for details).  She presents to the emergency department for evaluation of exertional dyspnea but had an episode of significant chest tightness radiating down the left arm with diaphoresis.  Some diaphoresis this morning noted by family.  No acute ischemia on EKG. Lower suspicion for PE. Troponin is elevated here. Will start Heparin. No active CP. Will reach out to Dr. Nadyne Coombes regarding NSTEMI and admit.   Discussed patient's case with Cardiology, Dr. Virgina Jock to request admission. Patient and family (if present) updated with plan. Care transferred to Cardiology service. I have palced temporary admission orders at his request.   I reviewed all nursing notes, vitals, pertinent old records, EKGs, labs, imaging (as available).  ____________________________________________  FINAL CLINICAL IMPRESSION(S) / ED DIAGNOSES  Final diagnoses:  NSTEMI (non-ST elevated myocardial infarction) ([place])    MEDICATIONS GIVEN DURING THIS VISIT:  Medications  heparin bolus via infusion 4,000 Units (4,000 Units Intravenous Bolus from Bag 01/14/18 1502)    Followed by  heparin ADULT infusion 100 units/mL (25000 units/211mL sodium chloride 0.45%) (1,000 Units/hr Intravenous New Bag/Given 01/14/18 1503)  aspirin chewable tablet 324 mg (324 mg Oral Given 01/14/18 1330)    Note:  This document was prepared using Dragon voice recognition software and may include unintentional dictation errors.  Nanda Quinton, MD Emergency Medicine    , [facility], MD 01/14/18 1556

## 2018-01-14 NOTE — ED Notes (Signed)
I have just given report to Marya Amsler, RN at [facility]. I will call [facility] for transport now.

## 2018-01-14 NOTE — Plan of Care (Signed)
Received as admit from [facility] via [facility]. Oriented to room/unit. Call bell and telephone in reach. Verbalized understanding.

## 2018-01-14 NOTE — ED Triage Notes (Signed)
Patient c/o dizziness and weakness that started about a week ago but has gradually gotten worse.   Patient was walking Friday and had to keep sitting down from feeling dizzy and nauseas.  Last night patient was sleeping and felt a lot of muscle tightness down her left arm and over her chest.   Patient c/o chills.   Patient was told by primary care doctor that Kidney functions were low and iron was low. Patient is suppose to be following up.   A/Ox4.  Wheel chair in [place]

## 2018-01-14 NOTE — H&P (Addendum)
Kathryn [name] is an 77 y.o. female.   Chief Complaint: Chest pain HPI:   77 y.o AAF w/CAD s/p PCI to LCx 2012, hypertension, statin intolerant hyperlipidemia, CKD stage 3, presented to [facility] ED with complaints of chest pain.  Patient had tightness across her chest associated with shortness of breath and generalized weakness at 2 am on 12/01 while in the bathroom. She also reported lightheadedness. She was somehow able to get back to her bed. Pain eased off in 15-20 mins. She told her daughter about this later in the day, who brought her to [facility] ED. Workup showed normal EKG, chest Xray, BNO, elevated trop at 0.32, elevated Cr at 2.1, up from 1.9 on 11/07. Patient is currently chest pain free. Daughter [name] and grandson Cheri Rous at bedside. Patient lives with husband Renelda Loma. At baseline, she has remote h/o statin intolerance and currently enrolled in a clinical trial for the same. Her PCP is Dr. Glendale Chard. Patient has had worsening Cr over the past year and half. She has not seen a nephrologist yet, although PCP had discussed this possibility with her.   Past Medical History:  Diagnosis Date  . Anxiety    takes Xanax bid prn  . Bruises easily    pt is on Plavix  . Chronic back pain    buldging disc  . Chronic neck pain   . Coronary artery disease    1 stent   . Dysphagia   . Headache(784.0)    related to cervical issues  . History of colonic polyps   . Hyperlipidemia    takes Crestor daily  . Hypertension    takes Coreg and Diovan daily  . Osteoporosis    was taking shot 2xyr;but not taking anymore  . Peripheral vascular disease ([place])   . Seasonal allergies    takes Zyrtec nightly    Past Surgical History:  Procedure Laterality Date  . ABDOMINAL HYSTERECTOMY  70's  . BUNIONECTOMY     left foot  . CARDIAC CATHETERIZATION  2009/2011   1 stent placed  . CHOLECYSTECTOMY  2009  . COLONOSCOPY    . ESOPHAGOGASTRODUODENOSCOPY    . TONSILLECTOMY     at age 66     Family History  Problem Relation Age of Onset  . Heart disease Mother        Heart Dissease before age 54  . Heart attack Mother   . Cancer Father   . Cancer Sister   . Leukemia Sister   . Cancer Brother   . Heart disease Brother        Amputation  . Heart attack Brother   . Heart disease Brother   . Heart attack Brother   . Anesthesia problems Neg Hx   . Hypotension Neg Hx   . Malignant hyperthermia Neg Hx   . Pseudochol deficiency Neg Hx    Social History:  reports that she has quit smoking. She quit after 5.00 years of use. She has never used smokeless tobacco. She reports that she does not drink alcohol or use drugs.  Allergies:  Allergies  Allergen Reactions  . Codeine Hypertension  . Shellfish Allergy      (Not in a hospital admission)  Results for orders placed or performed during the hospital encounter of 01/14/18 (from the past 48 hour(s))  Basic metabolic panel     Status: Abnormal   Collection Time: 01/14/18  1:14 PM  Result Value Ref Range   Sodium 142 135 - 145  mmol/L   Potassium 3.6 3.5 - 5.1 mmol/L   Chloride 107 98 - 111 mmol/L   CO2 24 22 - 32 mmol/L   Glucose, Bld 94 70 - 99 mg/dL   BUN 35 (H) 8 - 23 mg/dL   Creatinine, Ser 2.12 (H) 0.44 - 1.00 mg/dL   Calcium 9.7 8.9 - 10.3 mg/dL   GFR calc non Af Amer 22 (L) >60 mL/min   GFR calc Af Amer 25 (L) >60 mL/min   Anion gap 11 5 - 15    Comment: Performed at [facility], [place] [address], [place], [place] [address]  CBC     Status: Abnormal   Collection Time: 01/14/18  1:14 PM  Result Value Ref Range   WBC 8.8 4.0 - 10.5 K/uL   RBC 5.26 (H) 3.87 - 5.11 MIL/uL   Hemoglobin 11.7 (L) 12.0 - 15.0 g/dL   HCT 38.9 36.0 - 46.0 %   MCV 74.0 (L) 80.0 - 100.0 fL   MCH 22.2 (L) 26.0 - 34.0 pg   MCHC 30.1 30.0 - 36.0 g/dL   RDW 14.9 11.5 - 15.5 %   Platelets 383 150 - 400 K/uL   nRBC 0.0 0.0 - 0.2 %    Comment: Performed at [facility], [place] [address],  [place], [place] [address]  Troponin I - Once     Status: Abnormal   Collection Time: 01/14/18  1:15 PM  Result Value Ref Range   Troponin I 0.32 (HH) <0.03 ng/mL    Comment: CRITICAL RESULT CALLED TO, READ BACK BY AND VERIFIED WITH: R BARHAM,RN 01/14/18 1414 RHOLMES Performed at [facility], [place] [address], [place], [place] [address]   Brain natriuretic peptide     Status: None   Collection Time: 01/14/18  1:15 PM  Result Value Ref Range   B Natriuretic Peptide 50.3 0.0 - 100.0 pg/mL    Comment: Performed at [facility], [place] [address] [place], [place] [address]  Iron and TIBC     Status: None   Collection Time: 01/14/18  1:15 PM  Result Value Ref Range   Iron 63 28 - 170 ug/dL   TIBC 410 250 - 450 ug/dL   Saturation Ratios 15 10.4 - 31.8 %   UIBC 347 ug/dL    Comment: Performed at [facility], [place] [address], [place], [place] [address]  CBG monitoring, ED     Status: None   Collection Time: 01/14/18  1:16 PM  Result Value Ref Range   Glucose-Capillary 96 70 - 99 mg/dL  Urinalysis, Routine w reflex microscopic     Status: Abnormal   Collection Time: 01/14/18  3:00 PM  Result Value Ref Range   Color, Urine STRAW (A) YELLOW   APPearance CLEAR CLEAR   Specific Gravity, Urine 1.005 1.005 - 1.030   pH 6.0 5.0 - 8.0   Glucose, UA NEGATIVE NEGATIVE mg/dL   Hgb urine dipstick SMALL (A) NEGATIVE   Bilirubin Urine NEGATIVE NEGATIVE   Ketones, ur NEGATIVE NEGATIVE mg/dL   Protein, ur NEGATIVE NEGATIVE mg/dL   Nitrite NEGATIVE NEGATIVE   Leukocytes, UA NEGATIVE NEGATIVE   RBC / HPF 0-5 0 - 5 RBC/hpf   Bacteria, UA RARE (A) NONE SEEN   Squamous Epithelial / LPF 0-5 0 - 5    Comment: Performed at [facility], [place] [address] [place], [place] [address]   Dg Chest 2 View  Result Date: 01/14/2018  CLINICAL DATA:  Dizziness and weakness 1 week getting worse with left arm tingling beginning last night. EXAM: CHEST -  2 VIEW COMPARISON:  08/01/2017 FINDINGS: Lungs are adequately inflated without focal airspace consolidation or effusion. Mild stable prominence of the main pulmonary artery segment which may indicate a degree of pulmonary arterial hypertension. Cardiomediastinal silhouette and remainder of the exam is unchanged. IMPRESSION: No active cardiopulmonary disease. Electronically Signed   By: Marin Olp M.D.   On: 01/14/2018 14:02   Cardiac studies: EKG 01/14/2018: Sinus rhythm 86 bpm. Normal EKG  Carotid artery duplex 01/01/2018: Stenosis in the right internal carotid artery (16-49%). Stenosis in the right common carotid artery (<50%).  Stenosis in the right external carotid artery (<50%). Stenosis in the left internal carotid artery (16-49%). Stenosis in the left common carotid artery (<50%). Stenosis in the left external carotid artery of <50%. Bilateral diffuse heterogeneous plaque noted.  Antegrade right vertebral artery flow. Antegrade left vertebral artery flow. Compared to the study done on  12/29/2016, no significant change noted. Consider f/u duplex in 1 yeaer if clinically indicated.  Cath 06/15/2010: LM: Normal LAD: Mild luminal irregularity Ramus: Large, normal LCx: Large, 90% stenosis at bifurcation of AV grove branch           Successful PCP 2.75 X 15 mm Cience DES RCA: ?  Echocardiogram 04/24/2017: - Overall left ventricular systolic function was normal. Left    ventricular ejection fraction was estimated , range being 60    % to 65 %. There was no diagnostic evidence of left    ventricular regional wall motion abnormalities. - Aortic valve thickness was mildly increased. - There was mild mitral valvular regurgitation. - The left atrium was mildly dilated. - The inferior vena cava was mildly dilated.   Review of Systems  Constitutional: Positive for malaise/fatigue.       Generalized weakness  HENT: Negative.   Eyes: Negative.   Respiratory:  Positive for shortness of breath (Improved at rest).   Cardiovascular: Positive for chest pain (Now resolved. Still has heaviness, which is different from episode that occured 12/01 early morning). Negative for leg swelling and PND.  Gastrointestinal: Positive for nausea (With exertion). Negative for abdominal pain and vomiting.  Genitourinary: Negative.   Musculoskeletal: Negative.   Neurological: Positive for dizziness (Currently resolved). Negative for loss of consciousness.  Endo/Heme/Allergies: Does not bruise/bleed easily.  Psychiatric/Behavioral: Negative.   All other systems reviewed and are negative.   Blood pressure 120/86, pulse 76, temperature 98.2 F (36.8 C), temperature source Oral, resp. rate 18, SpO2 100 %. Physical Exam  Nursing note and vitals reviewed. Constitutional: She is oriented to person, place, and time. She appears well-developed and well-nourished. No distress.  Mildly obese   HENT:  Head: Normocephalic and atraumatic.  Eyes: Pupils are equal, round, and reactive to light. Conjunctivae are normal.  Neck: No JVD present.  Cardiovascular: Normal rate, regular rhythm and normal heart sounds.  No murmur heard. Pulses:      Carotid pulses are on the right side with bruit.      Dorsalis pedis pulses are 0 on the right side, and 0 on the left side.       Posterior tibial pulses are 2+ on the right side, and 2+ on the left side.  Respiratory: Effort normal and breath sounds normal. No respiratory distress. She has no wheezes. She has no rales.  GI: Soft. Bowel sounds are normal. There is no tenderness.  Musculoskeletal: She exhibits no edema.  Lymphadenopathy:    She has no cervical adenopathy.  Neurological: She is alert and oriented to person, place, and time. No cranial nerve deficit.  Skin: Skin is warm and dry.  Psychiatric: She has a normal mood and affect.     Assessment/Plan 77 y/o AAF w/CAD s/p PCI to LCx 2012, hypertension, statin intolerant  hyperlipidemia, CKD stage 3, presented to [facility] ED with complaints of chest pain.  Chest pain: Trop 0.32. Most likely NSTEMI. PE less likely.  Patient is currently chest pain free, hemodynamically and electrically stable. Continue medical management. She will benefit from coronary angiography and possible intervention, when renal function improved. Recommend aspirin/heparin/baseline coreg. Hold P2Y12i until after cath. Not on statin due to reported statin intolerance and unwillingness to use PCSK9i. Currently enrolled in a clinical trial with investigational drug.  Echocardiogram in am.  AKI/CKD: Cr 2.1 Baseline around 1.8-1.9. Has had progressively worsening CKD stage 3. Patient was supposed to see nephrology outpatient but has not seen one yet. Recommend liberal hydration for now. Hold valsartan-HCTZ. Will obtain nephrology consult as requested by the family. I have explained to the patient that at current renal function, she has 1-2% risk of requiring dialysis after contrast use for coronary angiography and intervention.  Hypertension: Continue coreg, hold valsartan-HCTZ  Mild carotid stenosis: Continue medical management  Nigel Mormon, MD 01/14/2018, 4:17 PM   Manish Esther Hardy, MD [facility]. [place] Pager: [phone] Office: [phone] If no answer Cell [phone]

## 2018-01-14 NOTE — ED Notes (Signed)
ED TO INPATIENT HANDOFF REPORT  Name/Age/Gender Kathryn [name] 77 y.o. female  Code Status Code Status History    Date Active Date Inactive Code Status Order ID Comments User Context   03/17/2011 1540 03/22/2011 1628 Full Code [id number]  [place] Silos, RN Inpatient      Home/SNF/Other Home  Chief Complaint dizziness;muscle pain;weakness  Level of Care/Admitting Diagnosis ED Disposition    ED Disposition Condition [place] [facility] Area: [place] [[id number]]  Level of Care: Telemetry [5]  Diagnosis: NSTEMI (non-ST elevated myocardial infarction) [facility]) [[id number]]  Admitting Physician: Nigel Mormon [[id number]]  Attending Physician: Nigel Mormon [[id number]]  Estimated length of stay: past midnight tomorrow  Certification:: I certify this patient will need inpatient services for at least 2 midnights  PT Class (Do Not Modify): Inpatient [101]  PT Acc Code (Do Not Modify): Private [1]       Medical History Past Medical History:  Diagnosis Date  . Anxiety    takes Xanax bid prn  . Bruises easily    pt is on Plavix  . Chronic back pain    buldging disc  . Chronic neck pain   . Coronary artery disease    1 stent   . Dysphagia   . Headache(784.0)    related to cervical issues  . History of colonic polyps   . Hyperlipidemia    takes Crestor daily  . Hypertension    takes Coreg and Diovan daily  . Osteoporosis    was taking shot 2xyr;but not taking anymore  . Peripheral vascular disease ([employer])   . Seasonal allergies    takes Zyrtec nightly    Allergies Allergies  Allergen Reactions  . Codeine Hypertension  . Shellfish Allergy     IV Location/Drains/Wounds Patient Lines/Drains/Airways Status   Active Line/Drains/Airways    Name:   Placement date:   Placement time:   Site:   Days:   Peripheral IV 01/14/18 Left;Posterior Forearm   01/14/18    1313    Forearm   less than 1          Labs/Imaging Results for orders  placed or performed during the hospital encounter of 01/14/18 (from the past 48 hour(s))  Basic metabolic panel     Status: Abnormal   Collection Time: 01/14/18  1:14 PM  Result Value Ref Range   Sodium 142 135 - 145 mmol/L   Potassium 3.6 3.5 - 5.1 mmol/L   Chloride 107 98 - 111 mmol/L   CO2 24 22 - 32 mmol/L   Glucose, Bld 94 70 - 99 mg/dL   BUN 35 (H) 8 - 23 mg/dL   Creatinine, Ser 2.12 (H) 0.44 - 1.00 mg/dL   Calcium 9.7 8.9 - 10.3 mg/dL   GFR calc non Af Amer 22 (L) >60 mL/min   GFR calc Af Amer 25 (L) >60 mL/min   Anion gap 11 5 - 15    Comment: Performed at [facility], [place] [address], [place], [place] [address]  CBC     Status: Abnormal   Collection Time: 01/14/18  1:14 PM  Result Value Ref Range   WBC 8.8 4.0 - 10.5 K/uL   RBC 5.26 (H) 3.87 - 5.11 MIL/uL   Hemoglobin 11.7 (L) 12.0 - 15.0 g/dL   HCT 38.9 36.0 - 46.0 %   MCV 74.0 (L) 80.0 - 100.0 fL   MCH 22.2 (L) 26.0 - 34.0 pg   MCHC 30.1 30.0 -  36.0 g/dL   RDW 14.9 11.5 - 15.5 %   Platelets 383 150 - 400 K/uL   nRBC 0.0 0.0 - 0.2 %    Comment: Performed at [facility], [place] [address] [place], [place] [address]  Troponin I - Once     Status: Abnormal   Collection Time: 01/14/18  1:15 PM  Result Value Ref Range   Troponin I 0.32 (HH) <0.03 ng/mL    Comment: CRITICAL RESULT CALLED TO, READ BACK BY AND VERIFIED WITH: R BARHAM,RN 01/14/18 1414 RHOLMES Performed at [facility], [place] [address] [place], [place] [address]   Brain natriuretic peptide     Status: None   Collection Time: 01/14/18  1:15 PM  Result Value Ref Range   B Natriuretic Peptide 50.3 0.0 - 100.0 pg/mL    Comment: Performed at [facility], [place] [address], [place], [place] [address]  Iron and TIBC     Status: None   Collection Time: 01/14/18  1:15 PM  Result Value Ref Range   Iron 63 28 - 170 ug/dL   TIBC 410 250 - 450 ug/dL   Saturation Ratios 15 10.4 - 31.8 %   UIBC  347 ug/dL    Comment: Performed at [facility], [place] [address] [place], [place] [address]  CBG monitoring, ED     Status: None   Collection Time: 01/14/18  1:16 PM  Result Value Ref Range   Glucose-Capillary 96 70 - 99 mg/dL  Urinalysis, Routine w reflex microscopic     Status: Abnormal   Collection Time: 01/14/18  3:00 PM  Result Value Ref Range   Color, Urine STRAW (A) YELLOW   APPearance CLEAR CLEAR   Specific Gravity, Urine 1.005 1.005 - 1.030   pH 6.0 5.0 - 8.0   Glucose, UA NEGATIVE NEGATIVE mg/dL   Hgb urine dipstick SMALL (A) NEGATIVE   Bilirubin Urine NEGATIVE NEGATIVE   Ketones, ur NEGATIVE NEGATIVE mg/dL   Protein, ur NEGATIVE NEGATIVE mg/dL   Nitrite NEGATIVE NEGATIVE   Leukocytes, UA NEGATIVE NEGATIVE   RBC / HPF 0-5 0 - 5 RBC/hpf   Bacteria, UA RARE (A) NONE SEEN   Squamous Epithelial / LPF 0-5 0 - 5    Comment: Performed at [facility], [place] [address] [place], [place] [address]   Dg Chest 2 View  Result Date: 01/14/2018 CLINICAL DATA:  Dizziness and weakness 1 week getting worse with left arm tingling beginning last night. EXAM: CHEST - 2 VIEW COMPARISON:  08/01/2017 FINDINGS: Lungs are adequately inflated without focal airspace consolidation or effusion. Mild stable prominence of the main pulmonary artery segment which may indicate a degree of pulmonary arterial hypertension. Cardiomediastinal silhouette and remainder of the exam is unchanged. IMPRESSION: No active cardiopulmonary disease. Electronically Signed   By: Marin Olp M.D.   On: 01/14/2018 14:02   EKG Interpretation  Date/Time:  Sunday January 14 2018 12:47:15 EST Ventricular Rate:  86 PR Interval:    QRS Duration: 84 QT Interval:  351 QTC Calculation: 420 R Axis:   5 Text Interpretation:  Sinus rhythm Low voltage, precordial leads No STEMI.  Confirmed by Long, Joshua ([id number]) on 01/14/2018 2:48:36 PM   Pending Labs Unresulted Labs (From admission, onward)     Start     Ordered   01/15/18 0500  CBC  Daily,   R     12 /01/19 1434   01/14/18 2300  Heparin level (unfractionated)  Once-Timed,  R     01/14/18 1527          Vitals/Pain Today's Vitals   01/14/18 1400 01/14/18 1430 01/14/18 1530 01/14/18 1600  BP: 110/75 120/86 139/82 (!) 147/71  Pulse: 79 76 78 78  Resp: (!) 22 18 13 13   Temp:      TempSrc:      SpO2: 100% 100% 100% 100%  PainSc:        Isolation Precautions No active isolations  Medications Medications  heparin bolus via infusion 4,000 Units (4,000 Units Intravenous Bolus from Bag 01/14/18 1502)    Followed by  heparin ADULT infusion 100 units/mL (25000 units/28mL sodium chloride 0.45%) (1,000 Units/hr Intravenous New Bag/Given 01/14/18 1503)  0.9 %  sodium chloride infusion (has no administration in time range)  aspirin chewable tablet 324 mg (324 mg Oral Given 01/14/18 1330)    Mobility walks with device

## 2018-01-15 ENCOUNTER — Inpatient Hospital Stay (HOSPITAL_COMMUNITY): Payer: Medicare Other

## 2018-01-15 ENCOUNTER — Encounter: Payer: Self-pay | Admitting: Internal Medicine

## 2018-01-15 DIAGNOSIS — I129 Hypertensive chronic kidney disease with stage 1 through stage 4 chronic kidney disease, or unspecified chronic kidney disease: Secondary | ICD-10-CM | POA: Insufficient documentation

## 2018-01-15 DIAGNOSIS — N182 Chronic kidney disease, stage 2 (mild): Secondary | ICD-10-CM | POA: Insufficient documentation

## 2018-01-15 DIAGNOSIS — B0229 Other postherpetic nervous system involvement: Secondary | ICD-10-CM | POA: Insufficient documentation

## 2018-01-15 DIAGNOSIS — I1310 Hypertensive heart and chronic kidney disease without heart failure, with stage 1 through stage 4 chronic kidney disease, or unspecified chronic kidney disease: Secondary | ICD-10-CM | POA: Insufficient documentation

## 2018-01-15 DIAGNOSIS — R7309 Other abnormal glucose: Secondary | ICD-10-CM | POA: Insufficient documentation

## 2018-01-15 LAB — CBC
HCT: 34.2 % — ABNORMAL LOW (ref 36.0–46.0)
HEMOGLOBIN: 10.5 g/dL — AB (ref 12.0–15.0)
MCH: 22.5 pg — ABNORMAL LOW (ref 26.0–34.0)
MCHC: 30.7 g/dL (ref 30.0–36.0)
MCV: 73.2 fL — ABNORMAL LOW (ref 80.0–100.0)
Platelets: 329 10*3/uL (ref 150–400)
RBC: 4.67 MIL/uL (ref 3.87–5.11)
RDW: 14.5 % (ref 11.5–15.5)
WBC: 8 10*3/uL (ref 4.0–10.5)
nRBC: 0 % (ref 0.0–0.2)

## 2018-01-15 LAB — CREATININE, SERUM
Creatinine, Ser: 1.57 mg/dL — ABNORMAL HIGH (ref 0.44–1.00)
GFR calc Af Amer: 36 mL/min — ABNORMAL LOW (ref >60)
GFR calc non Af Amer: 31 mL/min — ABNORMAL LOW (ref >60)

## 2018-01-15 LAB — BASIC METABOLIC PANEL
Anion gap: 13 (ref 5–15)
BUN: 32 mg/dL — ABNORMAL HIGH (ref 8–23)
CALCIUM: 9.2 mg/dL (ref 8.9–10.3)
CO2: 20 mmol/L — ABNORMAL LOW (ref 22–32)
Chloride: 105 mmol/L (ref 98–111)
Creatinine, Ser: 2.16 mg/dL — ABNORMAL HIGH (ref 0.44–1.00)
GFR calc Af Amer: 25 mL/min — ABNORMAL LOW (ref >60)
GFR calc non Af Amer: 21 mL/min — ABNORMAL LOW (ref >60)
Glucose, Bld: 103 mg/dL — ABNORMAL HIGH (ref 70–99)
Potassium: 4 mmol/L (ref 3.5–5.1)
Sodium: 138 mmol/L (ref 135–145)

## 2018-01-15 LAB — HEPARIN LEVEL (UNFRACTIONATED)
Heparin Unfractionated: 0.32 IU/mL (ref 0.30–0.70)
Heparin Unfractionated: 0.5 IU/mL (ref 0.30–0.70)
Heparin Unfractionated: 0.59 IU/mL (ref 0.30–0.70)
Heparin Unfractionated: <0.1 IU/mL — ABNORMAL LOW (ref 0.30–0.70)

## 2018-01-15 LAB — TROPONIN I: Troponin I: 0.14 ng/mL (ref <0.03)

## 2018-01-15 LAB — ECHOCARDIOGRAM COMPLETE
Height: 65 in
Weight: 3489.6 oz

## 2018-01-15 LAB — PROTEIN / CREATININE RATIO, URINE
Creatinine, Urine: 45.11 mg/dL
Total Protein, Urine: <6 mg/dL

## 2018-01-15 MED ORDER — SODIUM CHLORIDE 0.9% FLUSH
3.0000 mL | Freq: Two times a day (BID) | INTRAVENOUS | Status: DC
  Administered 2018-01-15: 3 mL via INTRAVENOUS

## 2018-01-15 MED ORDER — ASPIRIN 81 MG PO CHEW
81.0000 mg | CHEWABLE_TABLET | ORAL | Status: AC
  Administered 2018-01-16: 81 mg via ORAL
  Filled 2018-01-15: qty 1

## 2018-01-15 MED ORDER — SODIUM CHLORIDE 0.9 % IV SOLN
INTRAVENOUS | Status: AC
  Administered 2018-01-15 (×2): via INTRAVENOUS
  Administered 2018-01-16: 75 mL/h via INTRAVENOUS

## 2018-01-15 MED ORDER — SODIUM CHLORIDE 0.9 % IV SOLN: INTRAVENOUS | Status: DC

## 2018-01-15 MED ORDER — SODIUM CHLORIDE 0.9 % IV SOLN: 250.0000 mL | INTRAVENOUS | Status: DC | PRN

## 2018-01-15 MED ORDER — SODIUM CHLORIDE 0.9% FLUSH: 3.0000 mL | INTRAVENOUS | Status: DC | PRN

## 2018-01-15 NOTE — Progress Notes (Signed)
ANTICOAGULATION CONSULT NOTE - Follow Up Consult  Pharmacy Consult for heparin Indication: chest pain/ACS  Labs: Recent Labs    01/14/18 1314 01/14/18 1315 01/14/18 2330  HGB 11.7*  --   --   HCT 38.9  --   --   PLT 383  --   --   HEPARINUNFRC  --   --  0.50  CREATININE 2.12*  --   --   TROPONINI  --  0.32*  --     Assessment/Plan:  77yo female therapeutic on heparin with initial dosing for likely NSTEMI. Will continue gtt at current rate and confirm stable with am labs.   Wynona Neat, PharmD, BCPS  01/15/2018,12:26 AM

## 2018-01-15 NOTE — Plan of Care (Signed)
  Problem: Education: Goal: Knowledge of General Education information will improve Description Including pain rating scale, medication(s)/side effects and non-pharmacologic comfort measures Outcome: Progressing   Problem: Clinical Measurements: Goal: Ability to maintain clinical measurements within normal limits will improve Outcome: Progressing Goal: Will remain free from infection Outcome: Progressing Goal: Diagnostic test results will improve Outcome: Progressing   Problem: Elimination: Goal: Will not experience complications related to bowel motility Outcome: Progressing   Problem: Safety: Goal: Ability to remain free from injury will improve Outcome: Progressing   Problem: Skin Integrity: Goal: Risk for impaired skin integrity will decrease Outcome: Progressing   

## 2018-01-15 NOTE — Progress Notes (Signed)
[place] for Heparin Indication: chest pain/ACS  Allergies  Allergen Reactions  . Codeine Hypertension  . Shellfish Allergy     Patient Measurements: Height: 5\' 5"  (165.1 cm) Weight: 218 lb 1.6 oz (98.9 kg) IBW/kg (Calculated) : 57 Heparin Dosing Weight: 78kg (using ActBW 107kg from 12/21/17)  Vital Signs: Temp: 98.2 F (36.8 C) (12/02 1454) Temp Source: Oral (12/02 1454) BP: 130/68 (12/02 1454) Pulse Rate: 69 (12/02 1454)  Labs: Recent Labs    01/14/18 1314 01/14/18 1315  01/15/18 0414 01/15/18 1004 01/15/18 1755  HGB 11.7*  --   --  10.5*  --   --   HCT 38.9  --   --  34.2*  --   --   PLT 383  --   --  329  --   --   HEPARINUNFRC  --   --    < > <0.10* 0.32 0.59  CREATININE 2.12*  --   --  2.16*  --  1.57*  TROPONINI  --  0.32*  --  0.14*  --   --    < > = values in this interval not displayed.    Estimated Creatinine Clearance: 34.4 mL/min (A) (by C-G formula based on SCr of 1.57 mg/dL (H)).   Medical History: Past Medical History:  Diagnosis Date  . Anxiety    takes Xanax bid prn  . Bruises easily    pt is on Plavix  . Chronic back pain    buldging disc  . Chronic neck pain   . Coronary artery disease    1 stent   . Dysphagia   . Headache(784.0)    related to cervical issues  . History of colonic polyps   . Hyperlipidemia    takes Crestor daily  . Hypertension    takes Coreg and Diovan daily  . Osteoporosis    was taking shot 2xyr;but not taking anymore  . Peripheral vascular disease ([place])   . Seasonal allergies    takes Zyrtec nightly    Medications:  No meds for anticoagulation PTA   Assessment: 77 yo F presenting with CP. Pharmacy consulted to dose heparin infusion for NSTEMI. Heparin level now therapeutic on recheck (undetectable this AM but was off for period of time for new IV). No bleeding reported. Hg down a bit, plt wnl.   Repeat heparin level this afternoon remains therapeutic.  Goal of  Therapy:  Heparin level 0.3-0.7 units/ml Monitor platelets by anticoagulation protocol: Yes   Plan:  Continue heparin at 1000 units/hr Monitor daily heparin level and CBC, s/sx bleeding Possible cath when SCr improved per Cards note  Arrie Senate, PharmD, BCPS Clinical Pharmacist [phone] Please check AMION for all [place] numbers 01/15/2018

## 2018-01-15 NOTE — Progress Notes (Signed)
Pt IV was leaking. Heparin was stopped at 0320 on 01-15-18 and restarted at 0438 on 01-15-18

## 2018-01-15 NOTE — Progress Notes (Signed)
[facility] for Heparin Indication: chest pain/ACS  Allergies  Allergen Reactions  . Codeine Hypertension  . Shellfish Allergy     Patient Measurements: Height: 5\' 5"  (165.1 cm) Weight: 218 lb 1.6 oz (98.9 kg) IBW/kg (Calculated) : 57 Heparin Dosing Weight: 78kg (using ActBW 107kg from 12/21/17)  Vital Signs: Temp: 97.8 F (36.6 C) (12/02 0414) Temp Source: Oral (12/02 0414) BP: 131/70 (12/02 0414) Pulse Rate: 69 (12/02 0414)  Labs: Recent Labs    01/14/18 1314 01/14/18 1315 01/14/18 2330 01/15/18 0414 01/15/18 1004  HGB 11.7*  --   --  10.5*  --   HCT 38.9  --   --  34.2*  --   PLT 383  --   --  329  --   HEPARINUNFRC  --   --  0.50 <0.10* 0.32  CREATININE 2.12*  --   --  2.16*  --   TROPONINI  --  0.32*  --  0.14*  --     Estimated Creatinine Clearance: 25 mL/min (A) (by C-G formula based on SCr of 2.16 mg/dL (H)).   Medical History: Past Medical History:  Diagnosis Date  . Anxiety    takes Xanax bid prn  . Bruises easily    pt is on Plavix  . Chronic back pain    buldging disc  . Chronic neck pain   . Coronary artery disease    1 stent   . Dysphagia   . Headache(784.0)    related to cervical issues  . History of colonic polyps   . Hyperlipidemia    takes Crestor daily  . Hypertension    takes Coreg and Diovan daily  . Osteoporosis    was taking shot 2xyr;but not taking anymore  . Peripheral vascular disease ([place])   . Seasonal allergies    takes Zyrtec nightly    Medications:  No meds for anticoagulation PTA   Assessment: 77 yo F presenting with CP. Pharmacy consulted to dose heparin infusion for NSTEMI. Heparin level now therapeutic on recheck (undetectable this AM but was off for period of time for new IV). No bleeding reported. Hg down a bit, plt wnl.   Goal of Therapy:  Heparin level 0.3-0.7 units/ml Monitor platelets by anticoagulation protocol: Yes   Plan:  Continue heparin at 1000 units/hr 8h  heparin level to confirm Monitor daily heparin level and CBC, s/sx bleeding Possible cath when SCr improved per Cards note  Elicia Lamp, PharmD, BCPS Clinical Pharmacist Clinical phone [phone] Please check AMION for all [place] contact numbers 01/15/2018 11:44 AM

## 2018-01-15 NOTE — Progress Notes (Signed)
  Echocardiogram 2D Echocardiogram has been performed.  Kathryn [name] 01/15/2018, 4:06 PM

## 2018-01-15 NOTE — Plan of Care (Signed)
  Problem: Education: Goal: Knowledge of General Education information will improve Description: Including pain rating scale, medication(s)/side effects and non-pharmacologic comfort measures Outcome: Progressing   Problem: Health Behavior/Discharge Planning: Goal: Ability to manage health-related needs will improve Outcome: Progressing   Problem: Clinical Measurements: Goal: Respiratory complications will improve Outcome: Progressing   

## 2018-01-15 NOTE — Consult Note (Signed)
[facility]  HISTORY AND PHYSICAL  Kathryn [name] is an 77 y.o. female.    Chief Complaint: chest pain  HPI: Pt is a 70F with a PMH significant for HTN, HLD, CAD s/p PCI to left circumflex 2012, and CKD Stage 3 who is now seen in consultation at the request of Dr. Virgina Jock for evaluation and recs re: CKD in the setting of needing a LHC.    Pt presented to [facility] ED for CP and dizziness 12/1 and was ultimately found to have an NSTEMI.  She was placed on heparin gtt and was transferred to [facility] for [place].    Cr trend over the past year and half as follows: 04/2016 (0.9) --> 1.7 (07/2017) --> 1.9 (11/7) and now 2.1 12/1. In this setting we are asked to see.  Pt does not consume ibuprofen.  Was previously on Diovan-HCT.   Doesn't appear that pressures have been soft here.  Doesn't have a nephrologist but was set to see our office.  She has already been started on IVFs.  Currently CP free.  In speaking with dtr, they think symptoms may have actually occurred starting on Thanksgiving since she had diarrhea, sweats, and "couldn't eat a bite" of dinner.  (These symptoms have resolved).     PMH: Past Medical History:  Diagnosis Date  . Anxiety    takes Xanax bid prn  . Bruises easily    pt is on Plavix  . Chronic back pain    buldging disc  . Chronic neck pain   . Coronary artery disease    1 stent   . Dysphagia   . Headache(784.0)    related to cervical issues  . History of colonic polyps   . Hyperlipidemia    takes Crestor daily  . Hypertension    takes Coreg and Diovan daily  . Osteoporosis    was taking shot 2xyr;but not taking anymore  . Peripheral vascular disease ([place])   . Seasonal allergies    takes Zyrtec nightly   PSH: Past Surgical History:  Procedure Laterality Date  . ABDOMINAL HYSTERECTOMY  70's  . BUNIONECTOMY     left foot  . CARDIAC CATHETERIZATION  2009/2011   1 stent placed  . CHOLECYSTECTOMY  2009  . COLONOSCOPY    . ESOPHAGOGASTRODUODENOSCOPY     . TONSILLECTOMY     at age 73     Past Medical History:  Diagnosis Date  . Anxiety    takes Xanax bid prn  . Bruises easily    pt is on Plavix  . Chronic back pain    buldging disc  . Chronic neck pain   . Coronary artery disease    1 stent   . Dysphagia   . Headache(784.0)    related to cervical issues  . History of colonic polyps   . Hyperlipidemia    takes Crestor daily  . Hypertension    takes Coreg and Diovan daily  . Osteoporosis    was taking shot 2xyr;but not taking anymore  . Peripheral vascular disease ([place])   . Seasonal allergies    takes Zyrtec nightly    Medications:   Scheduled: . aspirin  324 mg Oral NOW   Or  . aspirin  300 mg Rectal NOW  . aspirin EC  81 mg Oral Daily  . carvedilol  6.25 mg Oral BID WC  . multivitamin with minerals  1 tablet Oral Daily  . tiZANidine  4 mg Oral BID  Medications Prior to Admission  Medication Sig Dispense Refill  . Acetaminophen (TYLENOL EXTRA STRENGTH PO) Take 1,000 mg by mouth daily as needed (pain).     . CALCIUM PO Take 1 tablet by mouth daily.    . carvedilol (COREG) 6.25 MG tablet Take 6.25 mg by mouth 2 (two) times daily with a meal.     . cetirizine (ZYRTEC) 10 MG tablet Take 10 mg by mouth at bedtime.    . Investigational - Study Medication Take 1 tablet by mouth daily. Study name:008 Additional study details:For cholesterol (Dr. Skip Estimable)    . Multiple Vitamin (MULTIVITAMIN WITH MINERALS) TABS Take 1 tablet by mouth daily.    . Pregabalin ER (LYRICA CR) 165 MG TB24 Take by mouth.    [name] tiZANidine (ZANAFLEX) 4 MG tablet Take 1 tablet by mouth 2 (two) times daily.    . valsartan-hydrochlorothiazide (DIOVAN-HCT) 160-12.5 MG tablet TAKE (1) TABLET BY MOUTH ONCE DAILY. (Patient taking differently: Take 1 tablet by mouth daily. ) 90 tablet 0    ALLERGIES:   Allergies  Allergen Reactions  . Codeine Hypertension  . Shellfish Allergy     FAM HX: Family History  Problem Relation Age of Onset  .  Heart disease Mother        Heart Dissease before age 55  . Heart attack Mother   . Cancer Father   . Cancer Sister   . Leukemia Sister   . Cancer Brother   . Heart disease Brother        Amputation  . Heart attack Brother   . Heart disease Brother   . Heart attack Brother   . Anesthesia problems Neg Hx   . Hypotension Neg Hx   . Malignant hyperthermia Neg Hx   . Pseudochol deficiency Neg Hx     Social History:   reports that she has quit smoking. She quit after 5.00 years of use. She has never used smokeless tobacco. She reports that she does not drink alcohol or use drugs.  ROS: ROS All other systems reviewed and are negative except as per HPI  Blood pressure 131/70, pulse 69, temperature 97.8 F (36.6 C), temperature source Oral, resp. rate 18, height 5\' 5"  (1.651 m), weight 98.9 kg, SpO2 98 %. PHYSICAL EXAM: Physical Exam  GEN older woman, NAD, lying in bed HEENT EOMI PERRL NECK no JVD PULM clear bilaterally CV  RRR no m/r/g ABD soft nontender NABS EXT no LE edema NEURO AAO x 3    Results for orders placed or performed during the hospital encounter of 01/14/18 (from the past 48 hour(s))  Basic metabolic panel     Status: Abnormal   Collection Time: 01/14/18  1:14 PM  Result Value Ref Range   Sodium 142 135 - 145 mmol/L   Potassium 3.6 3.5 - 5.1 mmol/L   Chloride 107 98 - 111 mmol/L   CO2 24 22 - 32 mmol/L   Glucose, Bld 94 70 - 99 mg/dL   BUN 35 (H) 8 - 23 mg/dL   Creatinine, Ser 2.12 (H) 0.44 - 1.00 mg/dL   Calcium 9.7 8.9 - 10.3 mg/dL   GFR calc non Af Amer 22 (L) >60 mL/min   GFR calc Af Amer 25 (L) >60 mL/min   Anion gap 11 5 - 15    Comment: Performed at [facility], [place] [address], [place], [place] [address]  CBC     Status: Abnormal   Collection Time: 01/14/18  1:14 PM  Result Value Ref Range   WBC 8.8 4.0 - 10.5 K/uL   RBC 5.26 (H) 3.87 - 5.11 MIL/uL   Hemoglobin 11.7 (L) 12.0 - 15.0 g/dL   HCT 38.9 36.0 - 46.0 %   MCV 74.0  (L) 80.0 - 100.0 fL   MCH 22.2 (L) 26.0 - 34.0 pg   MCHC 30.1 30.0 - 36.0 g/dL   RDW 14.9 11.5 - 15.5 %   Platelets 383 150 - 400 K/uL   nRBC 0.0 0.0 - 0.2 %    Comment: Performed at [facility], [place] [address] [place], [place] [address]  Troponin I - Once     Status: Abnormal   Collection Time: 01/14/18  1:15 PM  Result Value Ref Range   Troponin I 0.32 (HH) <0.03 ng/mL    Comment: CRITICAL RESULT CALLED TO, READ BACK BY AND VERIFIED WITH: R BARHAM,RN 01/14/18 1414 RHOLMES Performed at [facility], [place] [address], [place], [place] [address]   Brain natriuretic peptide     Status: None   Collection Time: 01/14/18  1:15 PM  Result Value Ref Range   B Natriuretic Peptide 50.3 0.0 - 100.0 pg/mL    Comment: Performed at [facility], [place] [address] [place], [place] [address]  Iron and TIBC     Status: None   Collection Time: 01/14/18  1:15 PM  Result Value Ref Range   Iron 63 28 - 170 ug/dL   TIBC 410 250 - 450 ug/dL   Saturation Ratios 15 10.4 - 31.8 %   UIBC 347 ug/dL    Comment: Performed at [facility], [place] [address], [place], [place] [address]  CBG monitoring, ED     Status: None   Collection Time: 01/14/18  1:16 PM  Result Value Ref Range   Glucose-Capillary 96 70 - 99 mg/dL  Urinalysis, Routine w reflex microscopic     Status: Abnormal   Collection Time: 01/14/18  3:00 PM  Result Value Ref Range   Color, Urine STRAW (A) YELLOW   APPearance CLEAR CLEAR   Specific Gravity, Urine 1.005 1.005 - 1.030   pH 6.0 5.0 - 8.0   Glucose, UA NEGATIVE NEGATIVE mg/dL   Hgb urine dipstick SMALL (A) NEGATIVE   Bilirubin Urine NEGATIVE NEGATIVE   Ketones, ur NEGATIVE NEGATIVE mg/dL   Protein, ur NEGATIVE NEGATIVE mg/dL   Nitrite NEGATIVE NEGATIVE   Leukocytes, UA NEGATIVE NEGATIVE   RBC / HPF 0-5 0 - 5 RBC/hpf   Bacteria, UA RARE (A) NONE SEEN   Squamous Epithelial / LPF 0-5 0 - 5    Comment: Performed  at [facility], [place] [address], [place], [place] [address]  Heparin level (unfractionated)     Status: None   Collection Time: 01/14/18 11:30 PM  Result Value Ref Range   Heparin Unfractionated 0.50 0.30 - 0.70 IU/mL    Comment: (NOTE) If heparin results are below expected values, and patient dosage has  been confirmed, suggest follow up testing of antithrombin III levels. Performed at [facility], [place] [address], [place], [place] [address]   Basic metabolic panel     Status: Abnormal   Collection Time: 01/15/18  4:14 AM  Result Value Ref Range   Sodium 138 135 - 145 mmol/L   Potassium 4.0 3.5 - 5.1 mmol/L   Chloride 105 98 - 111 mmol/L   CO2 20 (L) 22 - 32 mmol/L   Glucose, Bld 103 (  H) 70 - 99 mg/dL   BUN 32 (H) 8 - 23 mg/dL   Creatinine, Ser 2.16 (H) 0.44 - 1.00 mg/dL   Calcium 9.2 8.9 - 10.3 mg/dL   GFR calc non Af Amer 21 (L) >60 mL/min   GFR calc Af Amer 25 (L) >60 mL/min   Anion gap 13 5 - 15    Comment: Performed at [place] [address] [place], [place] [address]  Troponin I - Once-Timed     Status: Abnormal   Collection Time: 01/15/18  4:14 AM  Result Value Ref Range   Troponin I 0.14 (HH) <0.03 ng/mL    Comment: CRITICAL RESULT CALLED TO, READ BACK BY AND VERIFIED WITH: C.HALL,RN 01/15/18 0543 DAVISB Performed at [facility], [place] [address] [place], [place] [address]   Heparin level (unfractionated)     Status: Abnormal   Collection Time: 01/15/18  4:14 AM  Result Value Ref Range   Heparin Unfractionated <0.10 (L) 0.30 - 0.70 IU/mL    Comment: (NOTE) If heparin results are below expected values, and patient dosage has  been confirmed, suggest follow up testing of antithrombin III levels. Performed at [facility], [place] [address], [place], [place] [address]   CBC     Status: Abnormal   Collection Time: 01/15/18  4:14 AM  Result Value Ref Range   WBC 8.0 4.0 - 10.5 K/uL   RBC 4.67 3.87 - 5.11 MIL/uL    Hemoglobin 10.5 (L) 12.0 - 15.0 g/dL   HCT 34.2 (L) 36.0 - 46.0 %   MCV 73.2 (L) 80.0 - 100.0 fL   MCH 22.5 (L) 26.0 - 34.0 pg   MCHC 30.7 30.0 - 36.0 g/dL   RDW 14.5 11.5 - 15.5 %   Platelets 329 150 - 400 K/uL   nRBC 0.0 0.0 - 0.2 %    Comment: Performed at [facility], [place] [address] [place], [place] [address]  Heparin level (unfractionated)     Status: None   Collection Time: 01/15/18 10:04 AM  Result Value Ref Range   Heparin Unfractionated 0.32 0.30 - 0.70 IU/mL    Comment: (NOTE) If heparin results are below expected values, and patient dosage has  been confirmed, suggest follow up testing of antithrombin III levels. Performed at [facility], [place] [address] [place], [place] [address]     Dg Chest 2 View  Result Date: 01/14/2018 CLINICAL DATA:  Dizziness and weakness 1 week getting worse with left arm tingling beginning last night. EXAM: CHEST - 2 VIEW COMPARISON:  08/01/2017 FINDINGS: Lungs are adequately inflated without focal airspace consolidation or effusion. Mild stable prominence of the main pulmonary artery segment which may indicate a degree of pulmonary arterial hypertension. Cardiomediastinal silhouette and remainder of the exam is unchanged. IMPRESSION: No active cardiopulmonary disease. Electronically Signed   By: Marin Olp M.D.   On: 01/14/2018 14:02    Assessment/Plan  1. CKD Stage 3b: Cr slightly up from baseline.  UA bland.  I suspect CKD due to HTN--> will get renal US, UP/C, SPEP, serum free light chains.  No indication for GN workup.  I agree with holding ACEi and diuretic and giving gentle fluids to reduce the risk of CIN; I don't think there is anything else really to optimize renal function before LHC.  Rec isotonic fluids at least 12 hrs before cath and 6 hrs after cath.  I discussed these risks with pt and husband  today; they are willing to proceed.  We will continue to follow.  2.  CAD: for [facility] tomorrow 12/3 hopefully.   3.  HLD- was  on investigative drug, intolerant of statins     Deiona Hooper 01/15/2018, 2:21 PM

## 2018-01-15 NOTE — Progress Notes (Addendum)
Subjective:  No chest pain. Doing well this morning  Cr remains elevated at 2.16  Objective:  Vital Signs in the last 24 hours: Temp:  [97.5 F (36.4 C)-98.2 F (36.8 C)] 97.8 F (36.6 C) (12/02 0414) Pulse Rate:  [69-82] 69 (12/02 0414) Resp:  [11-22] 20 (12/02 0414) BP: (110-147)/(51-86) 131/70 (12/02 0414) SpO2:  [93 %-100 %] 98 % (12/02 0414) Weight:  [98.5 kg-98.9 kg] 98.9 kg (12/02 0414)  Intake/Output from previous day: 12/01 0701 - 12/02 0700 In: 638.8 [P.O.:120; I.V.:518.8] Out: -  Intake/Output from this shift: Total I/O In: 638.8 [P.O.:120; I.V.:518.8] Out: -   Physical Exam: Nursing note and vitals reviewed. Constitutional: She is oriented to person, place, and time. She appears well-developed and well-nourished. No distress.  Mildly obese   HENT:  Head: Normocephalic and atraumatic.  Eyes: Pupils are equal, round, and reactive to light. Conjunctivae are normal.  Neck: No JVD present.  Cardiovascular: Normal rate, regular rhythm and normal heart sounds.  No murmur heard. Pulses:      Carotid pulses are on the right side with bruit.      Dorsalis pedis pulses are 0 on the right side, and 0 on the left side.       Posterior tibial pulses are 2+ on the right side, and 2+ on the left side.  Respiratory: Effort normal and breath sounds normal. No respiratory distress. She has no wheezes. She has no rales.  GI: Soft. Bowel sounds are normal. There is no tenderness.  Musculoskeletal: She exhibits no edema.  Lymphadenopathy:    She has no cervical adenopathy.  Neurological: She is alert and oriented to person, place, and time. No cranial nerve deficit.  Skin: Skin is warm and dry.  Psychiatric: She has a normal mood and affect.      Lab Results: Recent Labs    01/14/18 1314 01/15/18 0414  WBC 8.8 8.0  HGB 11.7* 10.5*  PLT 383 329   Recent Labs    01/14/18 1314 01/15/18 0414  NA 142 138  K 3.6 4.0  CL 107 105  CO2 24 20*  GLUCOSE 94 103*  BUN  35* 32*  CREATININE 2.12* 2.16*   Recent Labs    01/14/18 1315 01/15/18 0414  TROPONINI 0.32* 0.14*   Cardiac studies: EKG 01/14/2018: Sinus rhythm 86 bpm. Normal EKG  Carotid artery duplex 01/01/2018: Stenosis in the right internal carotid artery (16-49%). Stenosis in the right common carotid artery (<50%). Stenosis in the right external carotid artery (<50%). Stenosis in the left internal carotid artery (16-49%). Stenosis in the left common carotid artery (<50%). Stenosis in the left external carotid artery of <50%. Bilateral diffuse heterogeneous plaque noted.  Antegrade right vertebral artery flow. Antegrade left vertebral artery flow. Compared to the study done on 12/29/2016, no significant change noted. Consider f/u duplex in 1 yeaer if clinically indicated.  Cath 06/15/2010: LM: Normal LAD: Mild luminal irregularity Ramus: Large, normal LCx: Large, 90% stenosis at bifurcation of AV grove branch           Successful PCP 2.75 X 15 mm Cience DES RCA: ?  Echocardiogram 04/24/2017: - Overall left ventricular systolic function was normal. Left    ventricular ejection fraction was estimated , range being 60    % to 65 %. There was no diagnostic evidence of left    ventricular regional wall motion abnormalities. - Aortic valve thickness was mildly increased. - There was mild mitral valvular regurgitation. - The left atrium was mildly  dilated. - The inferior vena cava was mildly dilated.   Assessment/Plan 77 y/o AAF w/CAD s/p PCI to LCx 2012, hypertension, statin intolerant hyperlipidemia, CKD stage 3, presented to [facility] ED with complaints of chest pain.  Chest pain: Peak trop 0.32. Most likely NSTEMI. PE less likely.  Patient is currently chest pain free, hemodynamically and electrically stable. Continue medical management. She will benefit from coronary angiography and possible intervention, when renal function improved. Recommend  aspirin/heparin/baseline coreg. Hold P2Y12i until after cath. Not on statin due to reported statin intolerance and unwillingness to use PCSK9i. Currently enrolled in a clinical trial with investigational drug.  Echocardiogram pending Will hold cath today in hopes of improvement in renal function.  AKI/CKD: Cr 2.16. Baseline around 1.8-1.9. Has had progressively worsening CKD stage 3. Patient was supposed to see nephrology outpatient but has not seen one yet.  Discussed with Dr. Madelon Lips. Appreciate her input.   Patient never received IV fluids ordered by me last night. Recommend liberal hydration for now.  Hold valsartan-HCTZ. Will obtain nephrology consult as requested by the family. I have explained to the patient that at current renal function, she has 1-2% risk of requiring dialysis after contrast use for coronary angiography and intervention.  Hypertension: Continue coreg, hold valsartan-HCTZ  Mild carotid stenosis: Continue medical management    LOS: 1 day    Dorothea Yow J Viyaan Champine 01/15/2018, 6:05 AM  Yalonda Sample Esther Hardy, MD [facility] Pager: [phone] Office: [phone] If no answer Cell [phone]

## 2018-01-16 ENCOUNTER — Encounter (HOSPITAL_COMMUNITY): Payer: Self-pay | Admitting: Cardiology

## 2018-01-16 ENCOUNTER — Encounter (HOSPITAL_COMMUNITY)
Admission: EM | Disposition: A | Discharge status: Home / Self Care | Payer: Self-pay | Source: Home / Self Care | Attending: Cardiology

## 2018-01-16 HISTORY — PX: LEFT HEART CATH AND CORONARY ANGIOGRAPHY: CATH118249

## 2018-01-16 LAB — CBC
HEMATOCRIT: 32.8 % — AB (ref 36.0–46.0)
Hemoglobin: 10 g/dL — ABNORMAL LOW (ref 12.0–15.0)
MCH: 22.6 pg — ABNORMAL LOW (ref 26.0–34.0)
MCHC: 30.5 g/dL (ref 30.0–36.0)
MCV: 74 fL — ABNORMAL LOW (ref 80.0–100.0)
Platelets: 329 10*3/uL (ref 150–400)
RBC: 4.43 MIL/uL (ref 3.87–5.11)
RDW: 14.6 % (ref 11.5–15.5)
WBC: 8.9 10*3/uL (ref 4.0–10.5)
nRBC: 0 % (ref 0.0–0.2)

## 2018-01-16 LAB — POCT I-STAT 3, ART BLOOD GAS (G3+)
Acid-base deficit: 3 mmol/L — ABNORMAL HIGH (ref 0.0–2.0)
Bicarbonate: 21.1 mmol/L (ref 20.0–28.0)
O2 Saturation: 99 %
TCO2: 22 mmol/L (ref 22–32)
pCO2 arterial: 33.2 mmHg (ref 32.0–48.0)
pH, Arterial: 7.41 (ref 7.350–7.450)
pO2, Arterial: 121 mmHg — ABNORMAL HIGH (ref 83.0–108.0)

## 2018-01-16 LAB — PROTEIN ELECTROPHORESIS, SERUM
A/G Ratio: 1.2 (ref 0.7–1.7)
Albumin ELP: 3.5 g/dL (ref 2.9–4.4)
Alpha-1-Globulin: 0.2 g/dL (ref 0.0–0.4)
Alpha-2-Globulin: 0.8 g/dL (ref 0.4–1.0)
Beta Globulin: 1 g/dL (ref 0.7–1.3)
Gamma Globulin: 0.9 g/dL (ref 0.4–1.8)
Globulin, Total: 3 g/dL (ref 2.2–3.9)
Total Protein ELP: 6.5 g/dL (ref 6.0–8.5)

## 2018-01-16 LAB — BASIC METABOLIC PANEL
Anion gap: 9 (ref 5–15)
BUN: 26 mg/dL — ABNORMAL HIGH (ref 8–23)
CO2: 23 mmol/L (ref 22–32)
Calcium: 8.4 mg/dL — ABNORMAL LOW (ref 8.9–10.3)
Chloride: 109 mmol/L (ref 98–111)
Creatinine, Ser: 1.83 mg/dL — ABNORMAL HIGH (ref 0.44–1.00)
GFR calc Af Amer: 30 mL/min — ABNORMAL LOW (ref >60)
GFR calc non Af Amer: 26 mL/min — ABNORMAL LOW (ref >60)
Glucose, Bld: 109 mg/dL — ABNORMAL HIGH (ref 70–99)
Potassium: 4 mmol/L (ref 3.5–5.1)
Sodium: 141 mmol/L (ref 135–145)

## 2018-01-16 LAB — KAPPA/LAMBDA LIGHT CHAINS
Kappa free light chain: 25.7 mg/L — ABNORMAL HIGH (ref 3.3–19.4)
Kappa, lambda light chain ratio: 0.18 — ABNORMAL LOW (ref 0.26–1.65)
Lambda free light chains: 141.2 mg/L — ABNORMAL HIGH (ref 5.7–26.3)

## 2018-01-16 LAB — HEPARIN LEVEL (UNFRACTIONATED): Heparin Unfractionated: 0.5 IU/mL (ref 0.30–0.70)

## 2018-01-16 SURGERY — LEFT HEART CATH AND CORONARY ANGIOGRAPHY
Anesthesia: LOCAL

## 2018-01-16 MED ORDER — AMLODIPINE BESYLATE 5 MG PO TABS: 5.0000 mg | ORAL_TABLET | Freq: Every day | ORAL | 3 refills | Status: DC

## 2018-01-16 MED ORDER — HEPARIN (PORCINE) IN NACL 1000-0.9 UT/500ML-% IV SOLN
INTRAVENOUS | Status: AC
  Filled 2018-01-16: qty 1000

## 2018-01-16 MED ORDER — FENTANYL CITRATE (PF) 100 MCG/2ML IJ SOLN
INTRAMUSCULAR | Status: DC | PRN
  Administered 2018-01-16: 50 ug via INTRAVENOUS
  Administered 2018-01-16: 25 ug via INTRAVENOUS

## 2018-01-16 MED ORDER — MIDAZOLAM HCL 2 MG/2ML IJ SOLN
INTRAMUSCULAR | Status: DC | PRN
  Administered 2018-01-16 (×2): 1 mg via INTRAVENOUS

## 2018-01-16 MED ORDER — NITROGLYCERIN 0.4 MG SL SUBL: 0.4000 mg | SUBLINGUAL_TABLET | SUBLINGUAL | 3 refills | Status: DC | PRN

## 2018-01-16 MED ORDER — HEPARIN SODIUM (PORCINE) 1000 UNIT/ML IJ SOLN
INTRAMUSCULAR | Status: DC | PRN
  Administered 2018-01-16: 5000 [IU] via INTRAVENOUS

## 2018-01-16 MED ORDER — HEPARIN (PORCINE) IN NACL 1000-0.9 UT/500ML-% IV SOLN
INTRAVENOUS | Status: DC | PRN
  Administered 2018-01-16 (×2): 500 mL

## 2018-01-16 MED ORDER — AMLODIPINE BESYLATE 5 MG PO TABS
5.0000 mg | ORAL_TABLET | Freq: Every day | ORAL | Status: DC
  Administered 2018-01-16: 5 mg via ORAL
  Filled 2018-01-16: qty 1

## 2018-01-16 MED ORDER — VERAPAMIL HCL 2.5 MG/ML IV SOLN
INTRAVENOUS | Status: AC
  Filled 2018-01-16: qty 2

## 2018-01-16 MED ORDER — ONDANSETRON HCL 4 MG/2ML IJ SOLN: 4.0000 mg | Freq: Four times a day (QID) | INTRAMUSCULAR | Status: DC | PRN

## 2018-01-16 MED ORDER — IOHEXOL 350 MG/ML SOLN
INTRAVENOUS | Status: DC | PRN
  Administered 2018-01-16: 20 mL via INTRAVENOUS

## 2018-01-16 MED ORDER — CLOPIDOGREL BISULFATE 300 MG PO TABS
300.0000 mg | ORAL_TABLET | Freq: Once | ORAL | Status: AC
  Administered 2018-01-16: 300 mg via ORAL
  Filled 2018-01-16: qty 1

## 2018-01-16 MED ORDER — SODIUM CHLORIDE 0.9 % IV SOLN
INTRAVENOUS | Status: AC
  Administered 2018-01-16: 13:00:00 via INTRAVENOUS

## 2018-01-16 MED ORDER — CLOPIDOGREL BISULFATE 75 MG PO TABS: 75.0000 mg | ORAL_TABLET | Freq: Every day | ORAL | 3 refills | Status: DC

## 2018-01-16 MED ORDER — ACETAMINOPHEN 325 MG PO TABS: 650.0000 mg | ORAL_TABLET | ORAL | Status: DC | PRN

## 2018-01-16 MED ORDER — LIDOCAINE HCL (PF) 1 % IJ SOLN
INTRAMUSCULAR | Status: AC
  Filled 2018-01-16: qty 30

## 2018-01-16 MED ORDER — LIDOCAINE HCL (PF) 1 % IJ SOLN
INTRAMUSCULAR | Status: DC | PRN
  Administered 2018-01-16: 2 mL

## 2018-01-16 MED ORDER — SODIUM CHLORIDE 0.9 % IV SOLN: 250.0000 mL | INTRAVENOUS | Status: DC | PRN

## 2018-01-16 MED ORDER — SODIUM CHLORIDE 0.9 % IV SOLN
INTRAVENOUS | Status: AC | PRN
  Administered 2018-01-16: 250 mL via INTRAVENOUS

## 2018-01-16 MED ORDER — TRAZODONE HCL 50 MG PO TABS: 25.0000 mg | ORAL_TABLET | Freq: Every day | ORAL | Status: DC

## 2018-01-16 MED ORDER — SODIUM CHLORIDE 0.9% FLUSH: 3.0000 mL | INTRAVENOUS | Status: DC | PRN

## 2018-01-16 MED ORDER — VERAPAMIL HCL 2.5 MG/ML IV SOLN
INTRAVENOUS | Status: DC | PRN
  Administered 2018-01-16: 08:00:00 via INTRA_ARTERIAL

## 2018-01-16 MED ORDER — MIDAZOLAM HCL 2 MG/2ML IJ SOLN
INTRAMUSCULAR | Status: AC
  Filled 2018-01-16: qty 2

## 2018-01-16 MED ORDER — HEPARIN SODIUM (PORCINE) 1000 UNIT/ML IJ SOLN
INTRAMUSCULAR | Status: AC
  Filled 2018-01-16: qty 1

## 2018-01-16 MED ORDER — CLOPIDOGREL BISULFATE 75 MG PO TABS: 75.0000 mg | ORAL_TABLET | Freq: Every day | ORAL | Status: DC

## 2018-01-16 MED ORDER — ASPIRIN 81 MG PO CHEW: 81.0000 mg | CHEWABLE_TABLET | Freq: Every day | ORAL | 3 refills | Status: AC

## 2018-01-16 MED ORDER — SODIUM CHLORIDE 0.9% FLUSH: 3.0000 mL | Freq: Two times a day (BID) | INTRAVENOUS | Status: DC

## 2018-01-16 MED ORDER — FENTANYL CITRATE (PF) 100 MCG/2ML IJ SOLN
INTRAMUSCULAR | Status: AC
  Filled 2018-01-16: qty 2

## 2018-01-16 SURGICAL SUPPLY — 10 items
CATH 5FR JL3.5 JR4 ANG PIG MP (CATHETERS) ×1 IMPLANT
DEVICE RAD COMP TR BAND LRG (VASCULAR PRODUCTS) ×1 IMPLANT
GLIDESHEATH SLEND A-KIT 6F 22G (SHEATH) ×1 IMPLANT
GUIDEWIRE INQWIRE 1.5J.035X260 (WIRE) IMPLANT
INQWIRE 1.5J .035X260CM (WIRE) ×2
KIT HEART LEFT (KITS) ×2 IMPLANT
PACK CARDIAC CATHETERIZATION (CUSTOM PROCEDURE TRAY) ×2 IMPLANT
SHEATH PROBE COVER 6X72 (BAG) ×1 IMPLANT
TRANSDUCER W/STOPCOCK (MISCELLANEOUS) ×2 IMPLANT
TUBING CIL FLEX 10 FLL-RA (TUBING) ×2 IMPLANT

## 2018-01-16 NOTE — Progress Notes (Signed)
[place] for Heparin Indication: chest pain/ACS  Allergies  Allergen Reactions  . Codeine Hypertension  . Shellfish Allergy     Patient Measurements: Height: 5\' 5"  (165.1 cm) Weight: 221 lb 3.2 oz (100.3 kg) IBW/kg (Calculated) : 57 Heparin Dosing Weight: 78kg (using ActBW 107kg from 12/21/17)  Vital Signs: Temp: (P) 97.7 F (36.5 C) (12/03 0825) Temp Source: (P) Oral (12/03 0825) BP: 141/63 (12/03 0607) Pulse Rate: 68 (12/03 0607)  Labs: Recent Labs    01/14/18 1314 01/14/18 1315  01/15/18 0414 01/15/18 1004 01/15/18 1755 01/16/18 0409  HGB 11.7*  --   --  10.5*  --   --  10.0*  HCT 38.9  --   --  34.2*  --   --  32.8*  PLT 383  --   --  329  --   --  329  HEPARINUNFRC  --   --    < > <0.10* 0.32 0.59 0.50  CREATININE 2.12*  --   --  2.16*  --  1.57* 1.83*  TROPONINI  --  0.32*  --  0.14*  --   --   --    < > = values in this interval not displayed.    Estimated Creatinine Clearance: 29.7 mL/min (A) (by C-G formula based on SCr of 1.83 mg/dL (H)).   Medical History: Past Medical History:  Diagnosis Date  . Anxiety    takes Xanax bid prn  . Bruises easily    pt is on Plavix  . Chronic back pain    buldging disc  . Chronic neck pain   . Coronary artery disease    1 stent   . Dysphagia   . Headache(784.0)    related to cervical issues  . History of colonic polyps   . Hyperlipidemia    takes Crestor daily  . Hypertension    takes Coreg and Diovan daily  . Osteoporosis    was taking shot 2xyr;but not taking anymore  . Peripheral vascular disease ([place])   . Seasonal allergies    takes Zyrtec nightly    Medications:  No meds for anticoagulation PTA   Assessment: 77 yo F presenting with CP. Pharmacy consulted to dose heparin infusion for NSTEMI. Heparin level therapeutic. No bleeding reported. Hg down a bit, plt wnl.   Goal of Therapy:  Heparin level 0.3-0.7 units/ml Monitor platelets by anticoagulation  protocol: Yes   Plan:  Continue heparin at 1000 units/hr Monitor daily heparin level and CBC, s/sx bleeding Plan for cath today  Elicia Lamp, PharmD, BCPS Clinical Pharmacist Clinical phone [phone] Please check AMION for all [facility] contact numbers 01/16/2018 8:55 AM

## 2018-01-16 NOTE — Progress Notes (Signed)
[facility] Progress Note    Assessment/ Plan:   1. CKD Stage 3b: Cr slightly up from baseline.  UA bland.  I suspect CKD due to HTN--> will get renal US, UP/C, SPEP, serum free light chains.  No indication for GN workup. Getting isotonic post-fluids now.  Would hold ACEi-diuretic on d/c, agree with addition of amlodipine in the short term. Note that CIN peaks 48-72 hrs after cath, would rec she get her bloodwork checked by end of week  She has follow-up with me 12/10 at 11:30 am.  2.  CAD: [place] 12/3 without obstructive CAD, DAPT for 12 months recommended  3.  HLD- was on investigative drug, intolerant of statins     4.  Dispo: home today  Subjective:    S/p cath today with nonobstructive CAD.  Cr OK.     Objective:   BP (!) 141/63 (BP Location: Left Arm)   Pulse 68   Temp 97.7 F (36.5 C) (Oral)   Resp 18   Ht 5\' 5"  (1.651 m)   Wt 100.3 kg   SpO2 100%   BMI 36.81 kg/m   Intake/Output Summary (Last 24 hours) at 01/16/2018 1219 Last data filed at 01/16/2018 1043 Gross per 24 hour  Intake 1330.06 ml  Output 1500 ml  Net -169.94 ml   Weight change: 1.814 kg  Physical Exam: GEN older woman, NAD, lying in bed HEENT EOMI PERRL NECK no JVD PULM clear bilaterally CV  RRR no m/r/g ABD soft nontender NABS EXT no LE edema, R wrist with pressure dressing NEURO AAO x 3   Imaging: Dg Chest 2 View  Result Date: 01/14/2018 CLINICAL DATA:  Dizziness and weakness 1 week getting worse with left arm tingling beginning last night. EXAM: CHEST - 2 VIEW COMPARISON:  08/01/2017 FINDINGS: Lungs are adequately inflated without focal airspace consolidation or effusion. Mild stable prominence of the main pulmonary artery segment which may indicate a degree of pulmonary arterial hypertension. Cardiomediastinal silhouette and remainder of the exam is unchanged. IMPRESSION: No active cardiopulmonary disease. Electronically Signed   By: Marin Olp M.D.   On: 01/14/2018 14:02    US Renal  Result Date: 01/15/2018 CLINICAL DATA:  Chronic renal disease, stage III. EXAM: RENAL / URINARY TRACT ULTRASOUND COMPLETE COMPARISON:  None. FINDINGS: Right Kidney: Renal measurements: 9.4 x 3.8 x 4.6 = volume: 85.4 mL . Echogenicity within normal limits. No mass or hydronephrosis visualized. Left Kidney: Renal measurements: 10.5 x 5.1 x 4.5 = volume: 124.5 mL. Echogenicity within normal limits. No mass or hydronephrosis visualized. Bladder: Physiologically distended without focal mural thickening. No calculus or mass. IMPRESSION: Maintained cortical-medullary distinction within both kidneys without obstructive uropathy noted. No renal mass. No significant cortical thinning of the kidneys is identified. Electronically Signed   By: Ashley Royalty M.D.   On: 01/15/2018 23:39    Labs: BMET Recent Labs  Lab 01/14/18 1314 01/15/18 0414 01/15/18 1755 01/16/18 0409  NA 142 138  --  141  K 3.6 4.0  --  4.0  CL 107 105  --  109  CO2 24 20*  --  23  GLUCOSE 94 103*  --  109*  BUN 35* 32*  --  26*  CREATININE 2.12* 2.16* 1.57* 1.83*  CALCIUM 9.7 9.2  --  8.4*   CBC Recent Labs  Lab 01/14/18 1314 01/15/18 0414 01/16/18 0409  WBC 8.8 8.0 8.9  HGB 11.7* 10.5* 10.0*  HCT 38.9 34.2* 32.8*  MCV 74.0* 73.2* 74.0*  PLT 383 329 329    Medications:    . amLODipine  5 mg Oral Daily  . aspirin EC  81 mg Oral Daily  . carvedilol  6.25 mg Oral BID WC  . [START ON 01/17/2018] clopidogrel  75 mg Oral Daily  . multivitamin with minerals  1 tablet Oral Daily  . sodium chloride flush  3 mL Intravenous Q12H  . tiZANidine  4 mg Oral BID  . traZODone  25 mg Oral QHS      Madelon Lips, MD 01/16/2018, 12:19 PM

## 2018-01-16 NOTE — Progress Notes (Signed)
Patient received discharge information and acknowledged understanding of it. Patient IVs were removed. Patient received radial site care education.

## 2018-01-16 NOTE — Progress Notes (Signed)
Cr of 1.57 on 12/03 afternoon likely falsely low. 1.8 is about her baseline. Will proceed with cath today. Appreciate nephrology input.  Nigel Mormon, MD [facility] Pager: [phone] Office: [phone] If no answer Cell [phone]

## 2018-01-16 NOTE — Discharge Summary (Signed)
Physician Discharge Summary  Patient ID: Kathryn [name] MRN: [id number] DOB/AGE: 1940/03/19 77 y.o.  Admit date: 01/14/2018 Discharge date: 01/16/2018  Admission Diagnoses: Chest pain  Discharge Diagnoses:  Active Problems:   Carotid stenosis, asymptomatic, bilateral   NSTEMI (non-ST elevated myocardial infarction) [facility])   Essential hypertension   Hyperlipidemia   Discharged Condition: good  Hospital Course:   77 y/o AAF w/CAD s/p PCI to LCx 2012, hypertension, statin intolerant hyperlipidemia, CKD stage 3, presented to [facility] ED with complaints of chest pain.Workup showed trop rise and fall s/o NSTEMI, peak trop 0.31 ng/mL. Cr was elevated at 2.16. Nephrology were consulted. Appreciate Dr. Bishop Dublin input. Cr improved to 1.56-probably falsely low- and stabilized at 1.83. Patient eventually underwent cath on 01/16/18 that showed no obstructive CAD. PE is unlikely given the typical rise and fall of trop. Recommend medical treatment with DAPT with aspirin and plavix. Patient is not on statin due to intolerance. Patient will resume investigational study drug. I stopped her Valsartan-HCTZ and started amlodipine 5 mg daily. I stopped pregabalin due to CKD. Patient will follow up with me next week, and also follow up with Dr. Hollie Salk. . Anemia likely related to CKD. Will monitor.  Consults: Nephrology  Significant Diagnostic Studies: labs:   Results for Kathryn, [name] (MRN [id number]) as of 01/16/2018 09:25  Ref. Range 01/14/2018 13:14 01/15/2018 04:14 01/16/2018 04:09  WBC Latest Ref Range: 4.0 - 10.5 K/uL 8.8 8.0 8.9  RBC Latest Ref Range: 3.87 - 5.11 MIL/uL 5.26 (H) 4.67 4.43  Hemoglobin Latest Ref Range: 12.0 - 15.0 g/dL 11.7 (L) 10.5 (L) 10.0 (L)  HCT Latest Ref Range: 36.0 - 46.0 % 38.9 34.2 (L) 32.8 (L)  MCV Latest Ref Range: 80.0 - 100.0 fL 74.0 (L) 73.2 (L) 74.0 (L)  MCH Latest Ref Range: 26.0 - 34.0 pg 22.2 (L) 22.5 (L) 22.6 (L)  MCHC Latest Ref Range: 30.0 - 36.0 g/dL  30.1 30.7 30.5  RDW Latest Ref Range: 11.5 - 15.5 % 14.9 14.5 14.6  Platelets Latest Ref Range: 150 - 400 K/uL 383 329 329  nRBC Latest Ref Range: 0.0 - 0.2 % 0.0 0.0 0.0   Results for Kathryn, [name] (MRN [id number]) as of 01/16/2018 09:25  Ref. Range 01/14/2018 13:15 01/15/2018 04:14 01/15/2018 17:55 01/16/2018 50:03  BASIC METABOLIC PANEL Unknown  Rpt (A)  Rpt (A)  Sodium Latest Ref Range: 135 - 145 mmol/L  138  141  Potassium Latest Ref Range: 3.5 - 5.1 mmol/L  4.0  4.0  Chloride Latest Ref Range: 98 - 111 mmol/L  105  109  CO2 Latest Ref Range: 22 - 32 mmol/L  20 (L)  23  Glucose Latest Ref Range: 70 - 99 mg/dL  103 (H)  109 (H)  BUN Latest Ref Range: 8 - 23 mg/dL  32 (H)  26 (H)  Creatinine Latest Ref Range: 0.44 - 1.00 mg/dL  2.16 (H) 1.57 (H) 1.83 (H)  Calcium Latest Ref Range: 8.9 - 10.3 mg/dL  9.2  8.4 (L)  Anion gap Latest Ref Range: 5 - 15   13  9   GFR, Est Non African American Latest Ref Range: >60 mL/min  21 (L) 31 (L) 26 (L)  GFR, Est African American Latest Ref Range: >60 mL/min  25 (L) 36 (L) 30 (L)  B Natriuretic Peptide Latest Ref Range: 0.0 - 100.0 pg/mL 50.3     Troponin I Latest Ref Range: <0.03 ng/mL 0.32 (HH) 0.14 (HH)    Iron Latest Ref  Range: 28 - 170 ug/dL 63     UIBC Latest Units: ug/dL 347     TIBC Latest Ref Range: 250 - 450 ug/dL 410     Saturation Ratios Latest Ref Range: 10.4 - 31.8 % 15      Echocardiogram 12.03/2017: Study Conclusions  - Left ventricle: The cavity size was normal. There was moderate   concentric hypertrophy. Systolic function was normal. The   estimated ejection fraction was in the range of 60% to 65%.   Features are consistent with a pseudonormal left ventricular   filling pattern, with concomitant abnormal relaxation and   increased filling pressure (grade 2 diastolic dysfunction).   Doppler parameters are consistent with both elevated ventricular   end-diastolic filling pressure and elevated left atrial filling   pressure. -  Aortic valve: Mildly calcified annulus. Trileaflet; mildly   calcified leaflets. Valve mobility was mildly restricted. There   was mild stenosis. Mean gradient (S): 7 mm Hg. Peak gradient (S):   14 mm Hg. Valve area (VTI): 0.88 cm^2. Valve area (Vmax): 1.02   cm^2. Valve area (Vmean): 0.96 cm^2. - Mitral valve: Calcified annulus. There was mild regurgitation. - Left atrium: The atrium was mildly dilated. - Right ventricle: The cavity size was mildly dilated. There was   mild hypertrophy. - Right atrium: The atrium was mildly dilated. - Atrial septum: No defect or patent foramen ovale was identified. - Pulmonary arteries: Moderate pulmoanry hypertension. PA peak   pressure: 40 mm Hg (S) (RA 3 mm Hg).  Impressions:  - The right ventricular systolic pressure was increased consistent   with moderate pulmonary hypertension.  Cath 01/16/2018: LM: Normal LAD: Normal LCx: Normal. Patent mid LCx stent RCA: Normal  LVEDP normal  No obstructive coronary artery disease  Recommendation: DAPT with aspirin and plavix for a year for medical maangement of NSTEMI.  Treatments:  IV heparin  Discharge Exam: Blood pressure (!) 141/63, pulse 68, temperature (P) 97.7 F (36.5 C), temperature source (P) Oral, resp. rate 18, height 5\' 5"  (1.651 m), weight 100.3 kg, SpO2 100 %. Nursing noteand vitalsreviewed. Constitutional: She isoriented to person, place, and time. She appearswell-developedand well-nourished.No distress. Mildly obese  HENT:  Head:Normocephalicand atraumatic.  Eyes:Pupils are equal, round, and reactive to light.Conjunctivaeare normal.  Neck:No JVDpresent.  Cardiovascular:Normal rate,regular rhythmand normal heart sounds. No murmurheard. Pulses: Carotid pulses are on the right side with bruit. Dorsalis pedis pulses are 0on the right side, and 0on the left side.  Posterior tibial pulses are 2+on the right side, and 2+on the left  side.  Respiratory:Effort normaland breath sounds normal. Norespiratory distress. She hasno wheezes. She hasno rales.  GQ:QPYP.Bowel sounds are normal. There isno tenderness.  Musculoskeletal: She exhibits noedema.  Lymphadenopathy:  She has no cervical adenopathy.  Neurological: She isalertand oriented to person, place, and time. Nocranial nerve deficit.  Skin: Skin iswarmand dry.  Psychiatric: She has anormal mood and affect.  Disposition: Discharge disposition: 01-Home or Self Care       Discharge Instructions    Diet - low sodium heart healthy   Complete by:  As directed    Increase activity slowly   Complete by:  As directed      Allergies as of 01/16/2018      Reactions   Codeine Hypertension   Shellfish Allergy       Medication List    STOP taking these medications   LYRICA CR 165 MG Tb24 Generic drug:  Pregabalin ER   TYLENOL EXTRA STRENGTH PO  valsartan-hydrochlorothiazide 160-12.5 MG tablet Commonly known as:  DIOVAN-HCT     TAKE these medications   amLODipine 5 MG tablet Commonly known as:  NORVASC Take 1 tablet (5 mg total) by mouth daily.   aspirin 81 MG chewable tablet Chew 1 tablet (81 mg total) by mouth daily.   CALCIUM PO Take 1 tablet by mouth daily.   carvedilol 6.25 MG tablet Commonly known as:  COREG Take 6.25 mg by mouth 2 (two) times daily with a meal.   cetirizine 10 MG tablet Commonly known as:  ZYRTEC Take 10 mg by mouth at bedtime.   clopidogrel 75 MG tablet Commonly known as:  PLAVIX Take 1 tablet (75 mg total) by mouth daily. Start taking on:  01/17/2018   Investigational - Study Medication Take 1 tablet by mouth daily. Study name:008 Additional study details:For cholesterol (Dr. Skip Estimable)   multivitamin with minerals Tabs tablet Take 1 tablet by mouth daily.   nitroGLYCERIN 0.4 MG SL tablet Commonly known as:  NITROSTAT Place 1 tablet (0.4 mg total) under the tongue every 5 (five) minutes x 3 doses  as needed for chest pain.   tiZANidine 4 MG tablet Commonly known as:  ZANAFLEX Take 1 tablet by mouth 2 (two) times daily.      Follow-up Information    Nigel Mormon, MD Follow up on 01/22/2018.   Specialty:  Cardiology Why:  2:00 PM Contact information: [place] [address] [phone]           Signed: Nigel Mormon 01/16/2018, 9:23 AM   Nigel Mormon, MD [facility]. [place] Pager: [phone] Office: [phone] If no answer Cell [phone]

## 2018-01-16 NOTE — Interval H&P Note (Signed)
History and Physical Interval Note:  01/16/2018 7:30 AM  [place]  has presented today for surgery, with the diagnosis of cp  The various methods of treatment have been discussed with the patient and family. After consideration of risks, benefits and other options for treatment, the patient has consented to  Procedure(s): LEFT HEART CATH AND CORONARY ANGIOGRAPHY (N/A) as a surgical intervention .  The patient's history has been reviewed, patient examined, no change in status, stable for surgery.  I have reviewed the patient's chart and labs.  Questions were answered to the patient's satisfaction.    2016 Appropriate Use Criteria for Coronary Revascularization in Patients With Acute Coronary Syndrome NSTEMI/UA High Risk (TIMI Score 5-7) NSTEMI/Unstable angina, stabilized patient at high risk Link Here: sistemancia.com Indication:  Revascularization by PCI or CABG of 1 or more arteries in a patient with NSTEMI or unstable angina with Stabilization after presentation High risk for clinical events  A (7) Indication: 16; Score 7     [place]

## 2018-01-17 ENCOUNTER — Telehealth: Payer: Self-pay

## 2018-01-17 MED FILL — Heparin Sod (Porcine)-NaCl IV Soln 1000 Unit/500ML-0.9%: INTRAVENOUS | Qty: 500 | Status: AC

## 2018-01-17 NOTE — Telephone Encounter (Signed)
Returned the pt's call and scheduled her an appt for a hospital f/u.  The pt said that she was told at discharge that she can;t take the lyrica long acting anymore that she needs the short acting Lyrica for 2 times per day sent to the pharmacy.

## 2018-01-18 ENCOUNTER — Encounter: Payer: Self-pay | Admitting: Nurse Practitioner

## 2018-01-18 ENCOUNTER — Ambulatory Visit (INDEPENDENT_AMBULATORY_CARE_PROVIDER_SITE_OTHER): Payer: Medicare Other | Admitting: Nurse Practitioner

## 2018-01-18 VITALS — BP 132/70 | HR 71 | Temp 98.2°F | Ht 62.0 in | Wt 220.0 lb

## 2018-01-18 DIAGNOSIS — I214 Non-ST elevation (NSTEMI) myocardial infarction: Secondary | ICD-10-CM | POA: Diagnosis not present

## 2018-01-18 DIAGNOSIS — B0229 Other postherpetic nervous system involvement: Secondary | ICD-10-CM | POA: Diagnosis not present

## 2018-01-18 DIAGNOSIS — G4700 Insomnia, unspecified: Secondary | ICD-10-CM | POA: Diagnosis not present

## 2018-01-18 DIAGNOSIS — F419 Anxiety disorder, unspecified: Secondary | ICD-10-CM

## 2018-01-18 DIAGNOSIS — Z09 Encounter for follow-up examination after completed treatment for conditions other than malignant neoplasm: Secondary | ICD-10-CM | POA: Diagnosis not present

## 2018-01-18 MED ORDER — ALPRAZOLAM 0.25 MG PO TABS: 0.2500 mg | ORAL_TABLET | Freq: Two times a day (BID) | ORAL | 1 refills | Status: DC | PRN

## 2018-01-18 MED ORDER — PREGABALIN 75 MG PO CAPS: 75.0000 mg | ORAL_CAPSULE | Freq: Two times a day (BID) | ORAL | 5 refills | Status: DC

## 2018-01-18 NOTE — Progress Notes (Signed)
Subjective:     Patient ID: Kathryn [name] , female    DOB: 08-11-1940 , 77 y.o.   MRN: [id number]   Chief Complaint  Patient presents with  . Hospitalization Follow-up    patient states she was in the hospital because she had a heart attack    HPI  Here for hospital follow up for NSTEMI, she is to follow up with Cardiology and nephrology.  She was stopped from the Lyrica CR due to her kidney functions.  She had an elevated troponin. She is no longer having chest pain but has been under increased stress.  She was also found to have Carotid Stenosis  She is here today with her daughter.    Anxiety  Presents for follow-up visit. Symptoms include nervous/anxious behavior and shortness of breath. Patient reports no chest pain, dizziness or palpitations. Symptoms occur occasionally. The most recent episode lasted 6 minutes. The severity of symptoms is mild. The quality of sleep is poor. Nighttime awakenings: several.       Past Medical History:  Diagnosis Date  . Anxiety    takes Xanax bid prn  . Bruises easily    pt is on Plavix  . Chronic back pain    buldging disc  . Chronic neck pain   . Coronary artery disease    1 stent   . Dysphagia   . Headache(784.0)    related to cervical issues  . History of colonic polyps   . Hyperlipidemia    takes Crestor daily  . Hypertension    takes Coreg and Diovan daily  . Osteoporosis    was taking shot 2xyr;but not taking anymore  . Peripheral vascular disease ([place])   . Seasonal allergies    takes Zyrtec nightly     Family History  Problem Relation Age of Onset  . Heart disease Mother        Heart Dissease before age 39  . Heart attack Mother   . Cancer Father   . Cancer Sister   . Leukemia Sister   . Cancer Brother   . Heart disease Brother        Amputation  . Heart attack Brother   . Heart disease Brother   . Heart attack Brother   . Anesthesia problems Neg Hx   . Hypotension Neg Hx   . Malignant hyperthermia Neg  Hx   . Pseudochol deficiency Neg Hx      Current Outpatient Medications:  .  amLODipine (NORVASC) 5 MG tablet, Take 1 tablet (5 mg total) by mouth daily., Disp: 30 tablet, Rfl: 3 .  aspirin (ASPIRIN CHILDRENS) 81 MG chewable tablet, Chew 1 tablet (81 mg total) by mouth daily., Disp: 90 tablet, Rfl: 3 .  carvedilol (COREG) 6.25 MG tablet, Take 6.25 mg by mouth 2 (two) times daily with a meal. , Disp: , Rfl:  .  cetirizine (ZYRTEC) 10 MG tablet, Take 10 mg by mouth at bedtime., Disp: , Rfl:  .  clopidogrel (PLAVIX) 75 MG tablet, Take 1 tablet (75 mg total) by mouth daily., Disp: 90 tablet, Rfl: 3 .  Investigational - Study Medication, Take 1 tablet by mouth daily. Study name:008 Additional study details:For cholesterol (Dr. Skip Estimable), Disp: , Rfl:  .  Multiple Vitamin (MULTIVITAMIN WITH MINERALS) TABS, Take 1 tablet by mouth daily., Disp: , Rfl:  .  nitroGLYCERIN (NITROSTAT) 0.4 MG SL tablet, Place 1 tablet (0.4 mg total) under the tongue every 5 (five) minutes x 3 doses as needed  for chest pain., Disp: 30 tablet, Rfl: 3 .  tiZANidine (ZANAFLEX) 4 MG tablet, Take 1 tablet by mouth 2 (two) times daily., Disp: , Rfl:  .  CALCIUM PO, Take 1 tablet by mouth daily., Disp: , Rfl:    Allergies  Allergen Reactions  . Codeine Hypertension  . Shellfish Allergy      Review of Systems  Respiratory: Positive for shortness of breath.   Cardiovascular: Negative for chest pain and palpitations.  Neurological: Negative for dizziness.  Psychiatric/Behavioral: The patient is nervous/anxious.      Today's Vitals   01/18/18 1413  BP: 132/70  Pulse: 71  Temp: 98.2 F (36.8 C)  TempSrc: Oral  SpO2: 94%  Weight: 220 lb (99.8 kg)  Height: 5\' 2"  (1.575 m)  PainSc: 0-No pain   Body mass index is 40.24 kg/m.   Objective:  Physical Exam  Constitutional: She is oriented to person, place, and time. She appears well-developed and well-nourished.  Cardiovascular: Normal rate, regular rhythm and normal  heart sounds.  Pulmonary/Chest: Effort normal and breath sounds normal. No respiratory distress. She has no wheezes.  Neurological: She is alert and oriented to person, place, and time.  Skin: Skin is warm and dry.  Psychiatric: She has a normal mood and affect.         Assessment And Plan:     1. NSTEMI (non-ST elevated myocardial infarction) [facility])  Post hospital follow up admission on 12/1-12/3 for chest pain and she had a slightly elevated troponin  She is to follow up with her Cardiologist next week and the Nephrologist  2. Anxiety  She is under an increased amount of stress and feeling more anxious - ALPRAZolam (XANAX) 0.25 MG tablet; Take 1 tablet (0.25 mg total) by mouth 2 (two) times daily as needed for anxiety.  Dispense: 30 tablet; Refill: 1  3. Neuralgia, postherpetic  Chronic,  Will change her to lyrica from lyrica cr due to her kidney functions - pregabalin (LYRICA) 75 MG capsule; Take 1 capsule (75 mg total) by mouth 2 (two) times daily.  Dispense: 60 capsule; Refill: 5  4. Insomnia, unspecified type  She reports she does not go to sleep until 2 am  Often times worries about everyone else   She is not interested in any medications or counseling for her mood.She will come back in 3 months for follow up      Minette Brine, FNP

## 2018-01-19 ENCOUNTER — Other Ambulatory Visit: Payer: Self-pay

## 2018-01-19 MED ORDER — CARVEDILOL 6.25 MG PO TABS: 6.2500 mg | ORAL_TABLET | Freq: Two times a day (BID) | ORAL | 1 refills | Status: DC

## 2018-01-22 DIAGNOSIS — R0989 Other specified symptoms and signs involving the circulatory and respiratory systems: Secondary | ICD-10-CM | POA: Diagnosis not present

## 2018-01-22 DIAGNOSIS — I2510 Atherosclerotic heart disease of native coronary artery without angina pectoris: Secondary | ICD-10-CM | POA: Diagnosis not present

## 2018-01-22 DIAGNOSIS — I5030 Unspecified diastolic (congestive) heart failure: Secondary | ICD-10-CM | POA: Diagnosis not present

## 2018-01-23 ENCOUNTER — Inpatient Hospital Stay: Payer: Self-pay | Admitting: Internal Medicine

## 2018-01-23 DIAGNOSIS — D631 Anemia in chronic kidney disease: Secondary | ICD-10-CM | POA: Diagnosis not present

## 2018-01-23 DIAGNOSIS — N189 Chronic kidney disease, unspecified: Secondary | ICD-10-CM | POA: Diagnosis not present

## 2018-01-23 DIAGNOSIS — N390 Urinary tract infection, site not specified: Secondary | ICD-10-CM | POA: Diagnosis not present

## 2018-01-23 DIAGNOSIS — I5032 Chronic diastolic (congestive) heart failure: Secondary | ICD-10-CM | POA: Diagnosis not present

## 2018-01-23 DIAGNOSIS — I129 Hypertensive chronic kidney disease with stage 1 through stage 4 chronic kidney disease, or unspecified chronic kidney disease: Secondary | ICD-10-CM | POA: Diagnosis not present

## 2018-01-23 DIAGNOSIS — N183 Chronic kidney disease, stage 3 (moderate): Secondary | ICD-10-CM | POA: Diagnosis not present

## 2018-01-23 DIAGNOSIS — I2510 Atherosclerotic heart disease of native coronary artery without angina pectoris: Secondary | ICD-10-CM | POA: Diagnosis not present

## 2018-01-24 ENCOUNTER — Inpatient Hospital Stay: Payer: Self-pay | Admitting: Internal Medicine

## 2018-02-13 ENCOUNTER — Encounter: Payer: Self-pay | Admitting: Internal Medicine

## 2018-02-13 DIAGNOSIS — N183 Chronic kidney disease, stage 3 (moderate): Secondary | ICD-10-CM | POA: Diagnosis not present

## 2018-02-19 DIAGNOSIS — I2510 Atherosclerotic heart disease of native coronary artery without angina pectoris: Secondary | ICD-10-CM | POA: Diagnosis not present

## 2018-02-19 DIAGNOSIS — R0989 Other specified symptoms and signs involving the circulatory and respiratory systems: Secondary | ICD-10-CM | POA: Diagnosis not present

## 2018-02-19 DIAGNOSIS — I5032 Chronic diastolic (congestive) heart failure: Secondary | ICD-10-CM | POA: Diagnosis not present

## 2018-04-16 ENCOUNTER — Ambulatory Visit: Payer: Self-pay | Admitting: Cardiology

## 2018-04-23 ENCOUNTER — Ambulatory Visit (INDEPENDENT_AMBULATORY_CARE_PROVIDER_SITE_OTHER): Payer: Medicare Other | Admitting: Internal Medicine

## 2018-04-23 ENCOUNTER — Encounter: Payer: Self-pay | Admitting: Internal Medicine

## 2018-04-23 VITALS — BP 124/86 | HR 65 | Temp 97.3°F | Ht 62.0 in | Wt 225.0 lb

## 2018-04-23 DIAGNOSIS — N182 Chronic kidney disease, stage 2 (mild): Secondary | ICD-10-CM

## 2018-04-23 DIAGNOSIS — Z6841 Body Mass Index (BMI) 40.0 and over, adult: Secondary | ICD-10-CM

## 2018-04-23 DIAGNOSIS — R7303 Prediabetes: Secondary | ICD-10-CM

## 2018-04-23 DIAGNOSIS — M25551 Pain in right hip: Secondary | ICD-10-CM | POA: Diagnosis not present

## 2018-04-23 DIAGNOSIS — I129 Hypertensive chronic kidney disease with stage 1 through stage 4 chronic kidney disease, or unspecified chronic kidney disease: Secondary | ICD-10-CM | POA: Diagnosis not present

## 2018-04-23 DIAGNOSIS — G8929 Other chronic pain: Secondary | ICD-10-CM

## 2018-04-23 MED ORDER — HYDROCODONE-ACETAMINOPHEN 5-325 MG PO TABS: 1.0000 | ORAL_TABLET | Freq: Three times a day (TID) | ORAL | 0 refills | Status: DC | PRN

## 2018-04-23 NOTE — Progress Notes (Signed)
Subjective:     Patient ID: Kathryn [name] , female    DOB: 01-03-1941 , 78 y.o.   MRN: [id number]   Chief Complaint  Patient presents with  . Prediabetes  . Hypertension    HPI  She is here today for f/u prediabetes. She admits that she has not been exercising regularly. She has been busy caring for her sister who recently had a fall. She is now in a skilled nursing facility for rehab.   Hypertension  This is a chronic problem. The current episode started more than 1 year ago. The problem has been gradually improving since onset. The problem is controlled. Pertinent negatives include no blurred vision, chest pain, palpitations or shortness of breath. Risk factors for coronary artery disease include dyslipidemia, obesity, sedentary lifestyle and post-menopausal state.     Past Medical History:  Diagnosis Date  . Anxiety    takes Xanax bid prn  . Bruises easily    pt is on Plavix  . Chronic back pain    buldging disc  . Chronic neck pain   . Coronary artery disease    1 stent   . Dysphagia   . Headache(784.0)    related to cervical issues  . History of colonic polyps   . Hyperlipidemia    takes Crestor daily  . Hypertension    takes Coreg and Diovan daily  . Osteoporosis    was taking shot 2xyr;but not taking anymore  . Peripheral vascular disease ([place])   . Seasonal allergies    takes Zyrtec nightly     Family History  Problem Relation Age of Onset  . Heart disease Mother        Heart Dissease before age 50  . Heart attack Mother   . Cancer Father   . Cancer Sister   . Leukemia Sister   . Cancer Brother   . Heart disease Brother        Amputation  . Heart attack Brother   . Heart disease Brother   . Heart attack Brother   . Dementia Other   . Anesthesia problems Neg Hx   . Hypotension Neg Hx   . Malignant hyperthermia Neg Hx   . Pseudochol deficiency Neg Hx      Current Outpatient Medications:  .  ALPRAZolam (XANAX) 0.25 MG tablet, Take 1 tablet  (0.25 mg total) by mouth 2 (two) times daily as needed for anxiety., Disp: 30 tablet, Rfl: 1 .  aspirin (ASPIRIN CHILDRENS) 81 MG chewable tablet, Chew 1 tablet (81 mg total) by mouth daily., Disp: 90 tablet, Rfl: 3 .  CALCIUM PO, Take 1 tablet by mouth daily., Disp: , Rfl:  .  carvedilol (COREG) 6.25 MG tablet, Take 1 tablet (6.25 mg total) by mouth 2 (two) times daily with a meal., Disp: 90 tablet, Rfl: 1 .  cetirizine (ZYRTEC) 10 MG tablet, Take 10 mg by mouth at bedtime., Disp: , Rfl:  .  clopidogrel (PLAVIX) 75 MG tablet, Take 1 tablet (75 mg total) by mouth daily., Disp: 90 tablet, Rfl: 3 .  ferrous gluconate (IRON 27) 240 (27 FE) MG tablet, Take 240 mg by mouth 3 (three) times daily with meals., Disp: , Rfl:  .  furosemide (LASIX) 20 MG tablet, Take 20 mg by mouth., Disp: , Rfl:  .  Investigational - Study Medication, Take 1 tablet by mouth daily. Study name:008 Additional study details:For cholesterol (Dr. Skip Estimable), Disp: , Rfl:  .  Multiple Vitamin (MULTIVITAMIN WITH MINERALS) TABS,  Take 1 tablet by mouth daily., Disp: , Rfl:  .  nitroGLYCERIN (NITROSTAT) 0.4 MG SL tablet, Place 1 tablet (0.4 mg total) under the tongue every 5 (five) minutes x 3 doses as needed for chest pain., Disp: 30 tablet, Rfl: 3 .  pregabalin (LYRICA) 75 MG capsule, Take 1 capsule (75 mg total) by mouth 2 (two) times daily., Disp: 60 capsule, Rfl: 5 .  tiZANidine (ZANAFLEX) 4 MG tablet, Take 1 tablet by mouth 2 (two) times daily., Disp: , Rfl:  .  valsartan (DIOVAN) 80 MG tablet, Take 80 mg by mouth daily., Disp: , Rfl:  .  HYDROcodone-acetaminophen (NORCO/VICODIN) 5-325 MG tablet, Take 1 tablet by mouth every 8 (eight) hours as needed for moderate pain., Disp: 20 tablet, Rfl: 0   Allergies  Allergen Reactions  . Codeine Hypertension  . Shellfish Allergy      Review of Systems  Constitutional: Negative.   Eyes: Negative for blurred vision.  Respiratory: Negative.  Negative for shortness of breath.    Cardiovascular: Negative.  Negative for chest pain and palpitations.  Gastrointestinal: Negative.   Musculoskeletal: Positive for arthralgias (she c/o r hip pain. scheduled to see ortho later this week. there is some pain with ambulation. denies trauma/falls. ).  Neurological: Negative.   Psychiatric/Behavioral: Negative.      Today's Vitals   04/23/18 1038  BP: 124/86  Pulse: 65  Temp: (!) 97.3 F (36.3 C)  TempSrc: Oral  Weight: 225 lb (102.1 kg)  Height: _0  (1.575 m)  PainSc: 8   PainLoc: Hip   Body mass index is 41.15 kg/m.   Objective:  Physical Exam Vitals signs and nursing note reviewed.  Constitutional:      Appearance: Normal appearance.  HENT:     Head: Normocephalic and atraumatic.  Cardiovascular:     Rate and Rhythm: Normal rate and regular rhythm.     Heart sounds: Normal heart sounds.  Pulmonary:     Effort: Pulmonary effort is normal.     Breath sounds: Normal breath sounds.  Musculoskeletal:     Comments: R hip tenderness  Skin:    General: Skin is warm.  Neurological:     General: No focal deficit present.     Mental Status: She is alert.  Psychiatric:        Mood and Affect: Mood normal.        Behavior: Behavior normal.         Assessment And Plan:     1. Prediabetes  I will check labs as listed below. Importance of dietary, medication and exercise compliance was discussed with the patient.   - CMP14+EGFR - Hemoglobin A1c  2. Hypertensive nephropathy  Well controlled. She will continue with current meds. She is encouraged to avoid adding salt to her foods.   - Lipid panel  3. Chronic renal disease, stage II  Chronic. I will check a GFR, Cr today.   4. Chronic pain of right hip  She has h/o osteoarthritis. She has appt scheduled this week with Ortho. She was given rx vicodin to use prn. She has codeine allergy documented, but states she has taken vicodin in the past without any issues. PDMP report run and reviewed.   5.  Class 3 severe obesity due to excess calories with serious comorbidity and body mass index (BMI) of 40.0 to 44.9 in adult [facility])  Importance of achieving optimal weight to decrease risk of cardiovascular disease and cancers was discussed with the patient in full  detail. She is encouraged to start slowly - start with 10 minutes twice daily at least three to four days per week and to gradually build to 30 minutes five days weekly. She was given tips to incorporate more activity into her daily routine - take stairs when possible, park farther away from her job, grocery stores, etc.   [name], MD

## 2018-04-24 LAB — LIPID PANEL
Chol/HDL Ratio: 3.2 ratio (ref 0.0–4.4)
Cholesterol, Total: 143 mg/dL (ref 100–199)
HDL: 45 mg/dL (ref >39)
LDL Calculated: 85 mg/dL (ref 0–99)
Triglycerides: 64 mg/dL (ref 0–149)
VLDL Cholesterol Cal: 13 mg/dL (ref 5–40)

## 2018-04-24 LAB — CMP14+EGFR
ALT: 8 IU/L (ref 0–32)
AST: 18 IU/L (ref 0–40)
Albumin/Globulin Ratio: 1.8 (ref 1.2–2.2)
Albumin: 4.1 g/dL (ref 3.7–4.7)
Alkaline Phosphatase: 83 IU/L (ref 39–117)
BILIRUBIN TOTAL: 0.3 mg/dL (ref 0.0–1.2)
BUN/Creatinine Ratio: 15 (ref 12–28)
BUN: 30 mg/dL — ABNORMAL HIGH (ref 8–27)
CHLORIDE: 104 mmol/L (ref 96–106)
CO2: 23 mmol/L (ref 20–29)
Calcium: 9.5 mg/dL (ref 8.7–10.3)
Creatinine, Ser: 1.95 mg/dL — ABNORMAL HIGH (ref 0.57–1.00)
GFR calc Af Amer: 28 mL/min/{1.73_m2} — ABNORMAL LOW (ref >59)
GFR calc non Af Amer: 24 mL/min/{1.73_m2} — ABNORMAL LOW (ref >59)
GLOBULIN, TOTAL: 2.3 g/dL (ref 1.5–4.5)
Glucose: 86 mg/dL (ref 65–99)
Potassium: 5 mmol/L (ref 3.5–5.2)
Sodium: 144 mmol/L (ref 134–144)
Total Protein: 6.4 g/dL (ref 6.0–8.5)

## 2018-04-24 LAB — HEMOGLOBIN A1C
Est. average glucose Bld gHb Est-mCnc: 114 mg/dL
Hgb A1c MFr Bld: 5.6 % (ref 4.8–5.6)

## 2018-05-03 ENCOUNTER — Other Ambulatory Visit: Payer: Self-pay | Admitting: Cardiology

## 2018-05-16 ENCOUNTER — Other Ambulatory Visit: Payer: Self-pay | Admitting: Nurse Practitioner

## 2018-05-16 DIAGNOSIS — F419 Anxiety disorder, unspecified: Secondary | ICD-10-CM

## 2018-05-16 NOTE — Telephone Encounter (Signed)
alprazolam refill

## 2018-05-17 NOTE — Telephone Encounter (Signed)
I will refill once more however if she continues to need this medication I recommend she be seen in the office for this and will need to change to a different medication.

## 2018-05-22 ENCOUNTER — Other Ambulatory Visit: Payer: Self-pay | Admitting: Nurse Practitioner

## 2018-05-22 DIAGNOSIS — B0229 Other postherpetic nervous system involvement: Secondary | ICD-10-CM

## 2018-05-22 MED ORDER — PREGABALIN 75 MG PO CAPS: 75.0000 mg | ORAL_CAPSULE | Freq: Three times a day (TID) | ORAL | 5 refills | Status: DC

## 2018-05-24 ENCOUNTER — Ambulatory Visit: Payer: Self-pay

## 2018-05-24 DIAGNOSIS — N183 Chronic kidney disease, stage 3 unspecified: Secondary | ICD-10-CM

## 2018-05-24 DIAGNOSIS — I129 Hypertensive chronic kidney disease with stage 1 through stage 4 chronic kidney disease, or unspecified chronic kidney disease: Secondary | ICD-10-CM

## 2018-05-24 DIAGNOSIS — I214 Non-ST elevation (NSTEMI) myocardial infarction: Secondary | ICD-10-CM

## 2018-05-24 DIAGNOSIS — R7303 Prediabetes: Secondary | ICD-10-CM

## 2018-05-24 NOTE — Chronic Care Management (AMB) (Signed)
  Chronic Care Management   Telephone Outreach Note  05/24/2018 Name: ANALICIA SKIBINSKI MRN: [id number] DOB: Sep 27, 1940  Referred by: patient's health plan.   I reached out to Ms. [name] today by phone in response to a referral sent by Ms. Avonelle H Corkins's health plan. Ms. MYRNA VONSEGGERN and I briefly discussed care management needs related to CKD Stage III.  Ms. Capley was given information about Chronic Care Management services today including:  1. CCM service includes personalized support from designated clinical staff supervised by her physician, including individualized plan of care and coordination with other care providers 2. 24/7 contact phone numbers for assistance for urgent and routine care needs. 3. Service will only be billed when office clinical staff spend 20 minutes or more in a month to coordinate care. 4. Only one practitioner may furnish and bill the service in a calendar month. 5. The patient may stop CCM services at any time (effective at the end of the month) by phone call to the office staff. 6. The patient will be responsible for cost sharing (co-pay) of up to 20% of the service fee (after annual deductible is met).  Patient agreed to services and verbal consent obtained.    SDOH (Social Determinants of Health) Screening completed during today's call. No challenges noted at this time. The patient does not have an advance directive and is not interested in completing at this time. SW provided brief education to the patient and encouraged the patient to speak with her provider during her next office visit. SW provided the patient with direct contact information in the event she would like more information. No further SW intervention needed at this time. The patient understands she will be outreached in the next 7-17 days by [facility] nurse case manager Glenard Haring Little to complete intake assessment.  The CM team will reach out to the patient again over the  next 7-14 days.    Glendale Chard, MD has been notified of this outreach and Ms. Zehava H Gayden's decision and plan.   Daneen Schick, BSW, CDP [facility] / [facility] Care Management Social Worker [phone]  Total time spent performing care coordination and/or care management activities with the patient by phone or face to face = 20 minutes.

## 2018-05-24 NOTE — Patient Instructions (Signed)
Social Worker Visit Information    Materials provided: Verbal education about CCM program provided by phone  Ms. Lapre was given information about Chronic Care Management services today including:  1. CCM service includes personalized support from designated clinical staff supervised by her physician, including individualized plan of care and coordination with other care providers 2. 24/7 contact phone numbers for assistance for urgent and routine care needs. 3. Service will only be billed when office clinical staff spend 20 minutes or more in a month to coordinate care. 4. Only one practitioner may furnish and bill the service in a calendar month. 5. The patient may stop CCM services at any time (effective at the end of the month) by phone call to the office staff. 6. The patient will be responsible for cost sharing (co-pay) of up to 20% of the service fee (after annual deductible is met).  Patient agreed to services and verbal consent obtained.   The patient verbalized understanding of instructions provided today and declined a print copy of patient instruction materials.   Follow up plan: No further SW needs at this time. Provided patient direct contact information to this SW. Member will be outreached by CCM RN case manager in the next 7-14 days.  Daneen Schick, BSW, CDP [facility] Care Management Social Worker [phone]

## 2018-05-29 ENCOUNTER — Telehealth: Payer: Self-pay

## 2018-06-01 ENCOUNTER — Telehealth: Payer: Self-pay

## 2018-06-01 ENCOUNTER — Ambulatory Visit: Payer: Self-pay

## 2018-06-01 ENCOUNTER — Other Ambulatory Visit: Payer: Self-pay

## 2018-06-01 DIAGNOSIS — M25551 Pain in right hip: Secondary | ICD-10-CM

## 2018-06-01 DIAGNOSIS — I129 Hypertensive chronic kidney disease with stage 1 through stage 4 chronic kidney disease, or unspecified chronic kidney disease: Secondary | ICD-10-CM

## 2018-06-01 DIAGNOSIS — N183 Chronic kidney disease, stage 3 unspecified: Secondary | ICD-10-CM

## 2018-06-01 DIAGNOSIS — R7303 Prediabetes: Secondary | ICD-10-CM

## 2018-06-01 DIAGNOSIS — G8929 Other chronic pain: Secondary | ICD-10-CM

## 2018-06-01 NOTE — Telephone Encounter (Signed)
Pt notified of increase of lyrica

## 2018-06-01 NOTE — Telephone Encounter (Signed)
-----   Message from Minette Brine, [place] sent at 05/22/2018  2:42 PM EDT ----- Call patient to make aware I have increased her lyrica to 1 tab three times a day ----- Message ----- From: Michelle Nasuti, [place]: 04/27/2018   4:26 PM EDT To: Minette Brine, FNP  The pt said that at her visit she was told by Dr. Baird Cancer to have the NP who wrote the Lyrica 75mg  2 times per day to increase it for her pain. The pt said that it was helping but that it's not helping as much.  The pt said she was told that her does has to be in 2 pills because of her kidneys from Dr. Baird Cancer.

## 2018-06-04 NOTE — Chronic Care Management (AMB) (Signed)
Chronic Care Management   Initial Visit Note  06/01/2018 Name: Kathryn [name] MRN: [id number] DOB: May 27, 1940  Referred by: Glendale Chard, MD Reason for referral : Chronic Care Management (CCM RN INITIAL OUTREACH )   Kathryn [name] is a 78 y.o. year old female who is a primary care patient of Glendale Chard, MD. The CCM team was consulted for assistance with chronic disease management and care coordination needs.   Review of patient status, including review of consultants reports, relevant laboratory and other test results, and collaboration with appropriate care team members and the patient's provider was performed as part of comprehensive patient evaluation and provision of chronic care management services.    I spoke with Kathryn [name] by telephone today.   Objective:  Lab Results  Component Value Date   HGBA1C 5.6 04/23/2018   HGBA1C 5.6 12/21/2017   Lab Results  Component Value Date   MICROALBUR 30 12/21/2017   LDLCALC 85 04/23/2018   CREATININE 1.95 (H) 04/23/2018   BP Readings from Last 3 Encounters:  04/23/18 124/86  01/18/18 132/70  01/16/18 139/64    Goals Addressed      Patient Stated   . 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 Over the next 60 days, patient will work with RN CM to address needs related to referral for outpatient PT  Interventions:   CCM Telephone initial follow up with patient to establish goals . Evaluation of current treatment plan related to pain management for sciatica and patient's adherence to plan as established by provider . Provided education to patient re: the  benefits of stretching, walking and performing light exercise to help keep joints lubricated and muscles strong . Provided education to patient re: the benefits from receiving PT to help develop a home exercise plan and assist with balancing and strengthening (plan to start after COVID-19) . Reviewed medications with patient and discussed Dr. Baird Cancer approval to increase Lyrica to 75 mg tid . Collaborated with [place] regarding receipt of new Rx for increased Lyrica (spoke with Arnette Norris who confirmed this has been received), pt advised  . Discussed plans with patient for ongoing care management follow up and provided patient with direct contact information for care management team . Reviewed scheduled/upcoming provider appointments including: f/u with Orthopedic for steroid injection to lumbar spine, and PCP f/u with Dr. Baird Cancer scheduled for 08/20/18 . Provided patient with RNCM contact # and availability . Scheduled a CCM follow up call with patient for about 2 weeks   Patient Self Care Activities:  Verbalizes understanding of the education/information provided today . Self administers medications as prescribed . Attends all scheduled provider appointments . Calls pharmacy for medication refills . Attends church or other social activities . Performs ADL's independently . Performs IADL's independently . Calls provider office for new concerns or questions  Initial goal documentation    . "I don't know if I have an OTC benefit" (pt-stated)       Current Barriers:  [name] Knowledge Deficits related to Health Plan Benefit options, specifically for OTC benefits  Nurse Case Manager Clinical Goal(s):  Over the next 30  days, patient will verbalize basic understanding of her [employer] OTC benefit option.  Interventions:   CCM Telephone initial follow up with patient to establish goals . Advised patient to call member services, phone # located on the back of her insurance  card to inquire about her OTC benefits  Collaborated with [place] regarding [employer] OTC benefit options . Discussed plans with patient for ongoing care management follow up and provided patient with direct contact information for care management team . Provided RNCM contact # and hours of availability  . Scheduled a follow up visit with patient for 2-3 weeks  Patient Self Care Activities:   Verbalizes understanding of the education/information provided today . Self administers medications as prescribed . Attends all scheduled provider appointments . Calls pharmacy for medication refills . Attends church or other social activities . Performs ADL's independently . Performs IADL's independently . Calls provider office for new concerns or questions  Initial goal documentation    . "I have some anxiety's" (pt-stated)       Current Barriers:  [name] Knowledge Deficits related to Self Health management for Anxiety  Nurse Case Manager Clinical Goal(s):  [name] Over the next 30 days, patient will verbalize understanding of plan for treatment of Anxiety.   Interventions:   CCM Telephone initial follow up with patient to establish goals . Evaluation of current treatment plan related to treatment for Anxiety and patient's adherence to plan as established by provider . Provided education to patient re: relaxation techniques and pursed lip deep breathing exercises in order slow breathing during times of anxiety . Reviewed medications with patient and discussed when to call the doctor for ineffective coping and or worsening or persistent depression . Discussed plans with patient for ongoing care management follow up and provided patient with direct contact information for care management team . Provided RNCM contact # and hours of availability  . Scheduled a follow up visit with patient for about 2 weeks  Patient Self Care Activities:   Verbalizes understanding of the education/information  provided today . Self administers medications as prescribed . Attends all scheduled provider appointments . Calls pharmacy for medication refills . Attends church or other social activities . Performs ADL's independently . Performs IADL's independently . Calls provider office for new concerns or questions  Initial goal documentation        Telephone follow up appointment with CCM team member scheduled for: about 2 weeks  Barb Merino, [facility] Care Management Coordinator [place] [facility]  Direct Phone: [phone]

## 2018-06-04 NOTE — Patient Instructions (Signed)
Visit Information  Goals Addressed            This Visit's Progress     Patient Stated   . 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 Over the next 60 days, patient will work with RN CM to address needs related to referral for outpatient PT  Interventions:   CCM Telephone initial follow up with patient to establish goals . Evaluation of current treatment plan related to pain management for sciatica and patient's adherence to plan as established by provider . Provided education to patient re: the benefits of stretching, walking and performing light exercise to help keep joints lubricated and muscles strong . Provided education to patient re: the benefits from receiving PT to help develop a home exercise plan and assist with balancing and strengthening (plan to start after COVID-19) . Reviewed medications with patient and discussed Dr. Baird Cancer approval to increase Lyrica to 75 mg tid . Collaborated with [place] regarding receipt of new Rx for increased Lyrica (spoke with Arnette Norris who confirmed this has been received), pt advised  . Discussed plans with patient for ongoing care management follow up and provided patient with direct contact information for care management team . Reviewed scheduled/upcoming provider appointments including: f/u with Orthopedic for steroid injection to lumbar spine, and PCP f/u with Dr. Baird Cancer scheduled for 08/20/18 . Provided patient with RNCM contact # and availability . Scheduled a CCM follow up call with patient for about 2 weeks   Patient Self Care Activities:  Verbalizes understanding of the  education/information provided today . Self administers medications as prescribed . Attends all scheduled provider appointments . Calls pharmacy for medication refills . Attends church or other social activities . Performs ADL's independently . Performs IADL's independently . Calls provider office for new concerns or questions  Initial goal documentation      . "I don't know if I have an OTC benefit" (pt-stated)       Current Barriers:  [name] Knowledge Deficits related to Health Plan Benefit options, specifically for OTC benefits  Nurse Case Manager Clinical Goal(s):  Over the next 30 days, patient will verbalize basic understanding of her [employer] OTC benefit option.  Interventions:   CCM Telephone initial follow up with patient to establish goals . Advised patient to call member services, phone # located on the back of her insurance card to inquire about her OTC benefits  Collaborated with [place] regarding [employer] OTC benefit options . Discussed plans with patient for ongoing care management follow up and provided patient with direct contact information for care management team . Provided RNCM contact # and hours of availability  . Scheduled a follow up visit with patient for 2-3 weeks  Patient Self Care Activities:   Verbalizes understanding of the education/information provided today . Self administers medications as prescribed . Attends all scheduled provider appointments . Calls pharmacy for medication refills . Attends church or other social activities . Performs ADL's independently . Performs IADL's independently . Calls provider office for new concerns or questions  Initial goal documentation      . "I have some  anxiety's" (pt-stated)       Current Barriers:  [name] Knowledge Deficits related to Self Health management for Anxiety   Nurse Case Manager Clinical Goal(s):  [name] Over the next 30 days, patient will verbalize understanding of plan for  treatment of Anxiety.   Interventions:   CCM Telephone initial follow up with patient to establish goals . Evaluation of current treatment plan related to treatment for Anxiety and patient's adherence to plan as established by provider . Provided education to patient re: relaxation techniques and pursed lip deep breathing exercises in order slow breathing during times of anxiety . Reviewed medications with patient and discussed when to call the doctor for ineffective coping and or worsening or persistent depression . Discussed plans with patient for ongoing care management follow up and provided patient with direct contact information for care management team . Provided RNCM contact # and hours of availability  . Scheduled a follow up visit with patient for about 2 weeks  Patient Self Care Activities:   Verbalizes understanding of the education/information provided today . Self administers medications as prescribed . Attends all scheduled provider appointments . Calls pharmacy for medication refills . Attends church or other social activities . Performs ADL's independently . Performs IADL's independently . Calls provider office for new concerns or questions  Initial goal documentation         The patient verbalized understanding of instructions provided today and declined a print copy of patient instruction materials.   Telephone follow up appointment with CCM team member scheduled for: about 2 weeks  Kathryn [name], [facility] Care Management Coordinator [place] [facility]  Direct Phone: [phone]

## 2018-06-07 ENCOUNTER — Other Ambulatory Visit: Payer: Self-pay

## 2018-06-07 ENCOUNTER — Encounter: Payer: Self-pay | Admitting: Cardiology

## 2018-06-07 ENCOUNTER — Ambulatory Visit (INDEPENDENT_AMBULATORY_CARE_PROVIDER_SITE_OTHER): Payer: Medicare Other | Admitting: Cardiology

## 2018-06-07 VITALS — BP 116/48 | HR 66 | Ht 65.5 in | Wt 210.0 lb

## 2018-06-07 DIAGNOSIS — R112 Nausea with vomiting, unspecified: Secondary | ICD-10-CM | POA: Diagnosis not present

## 2018-06-07 DIAGNOSIS — I25118 Atherosclerotic heart disease of native coronary artery with other forms of angina pectoris: Secondary | ICD-10-CM

## 2018-06-07 DIAGNOSIS — Z006 Encounter for examination for normal comparison and control in clinical research program: Secondary | ICD-10-CM | POA: Diagnosis not present

## 2018-06-07 DIAGNOSIS — D509 Iron deficiency anemia, unspecified: Secondary | ICD-10-CM | POA: Diagnosis not present

## 2018-06-07 DIAGNOSIS — R55 Syncope and collapse: Secondary | ICD-10-CM

## 2018-06-07 DIAGNOSIS — I2510 Atherosclerotic heart disease of native coronary artery without angina pectoris: Secondary | ICD-10-CM | POA: Insufficient documentation

## 2018-06-07 NOTE — Progress Notes (Signed)
Virtual Visit via Video Note: This visit type was conducted due to national recommendations for restrictions regarding the COVID-19 Pandemic (e.g. social distancing).  This format is felt to be most appropriate for this patient at this time.  All issues noted in this document were discussed and addressed.  No physical exam was performed (except for noted visual exam findings with Telehealth visits).  The patient has consented to conduct a Telehealth visit and understands insurance will be billed.   I connected with@, on 06/07/18 at  by a video enabled telemedicine application and verified that I am speaking with the correct person using two identifiers.   I discussed the limitations of evaluation and management by telemedicine and the availability of in person appointments. The patient expressed understanding and agreed to proceed.   I have discussed with patient regarding the safety during COVID Pandemic and steps and precautions to be taken including social distancing, frequent hand wash and use of detergent soap, gels with the patient. I asked the patient to avoid touching mouth, nose, eyes, ears with the hands. I encouraged regular walking around the neighborhood and exercise and regular diet, as long as social distancing can be maintained.  Primary Physician/Referring:  Glendale Chard, MD  Patient ID: Kathryn [name], female    DOB: January 01, 1941, 78 y.o.   MRN: [id number]  Chief Complaint  Patient presents with  . Hypertension  . Carotid    HPI: Kathryn [name]  is a 78 y.o. female  with CAD s/p PCI to LCx 2009, hypertension, statin intolerant hyperlipidemia, CKD stage 3, was admitted to the hospital in 01/2018 with complaints of chest pain and diagnosed with non-STEMI peak troponin 0.31 and she eventually underwent cath on 01/16/18 that showed no obstructive CAD, medical management.   Patient is not on statin due to intolerance, she is enrolled in esperion trial.  Past medical  history significant for hypertension, hyperlipidemia, obesity and mild asymptomatic carotid artery stenosis bilateral.  This was a sick work in visit, patient's daughter was concerned that Kathryn [name] was not feeling well yesterday with nausea all day, after dinner last night she started throwing up twice, then while throwing up had an episode of syncope and fell into the vomitus but regained consciousness immediately.  EMS was called, EKG was normal and blood pressure was stable and was recommended conservative therapy.  Patient's daughter was concerned whether this is related to ACS and wanted to further discuss.  This morning she is feeling better, she has not had any nausea or vomiting.  State that she feels she is back to baseline.  Denies any chest pain or worsening dyspnea or leg edema.  Past Medical History:  Diagnosis Date  . Anxiety    takes Xanax bid prn  . Bruises easily    pt is on Plavix  . Chronic back pain    buldging disc  . Chronic neck pain   . Coronary artery disease    1 stent   . Dysphagia   . Headache(784.0)    related to cervical issues  . History of colonic polyps   . Hyperlipidemia    takes Crestor daily  . Hypertension    takes Coreg and Diovan daily  . Osteoporosis    was taking shot 2xyr;but not taking anymore  . Peripheral vascular disease ([place])   . Seasonal allergies    takes Zyrtec nightly    Past Surgical History:  Procedure Laterality Date  . ABDOMINAL HYSTERECTOMY  70's  .  BUNIONECTOMY     left foot  . CARDIAC CATHETERIZATION  2009/2011   1 stent placed  . CHOLECYSTECTOMY  2009  . COLONOSCOPY    . ESOPHAGOGASTRODUODENOSCOPY    . LEFT HEART CATH AND CORONARY ANGIOGRAPHY N/A 01/16/2018   Procedure: LEFT HEART CATH AND CORONARY ANGIOGRAPHY;  Surgeon: Nigel Mormon, MD;  Location: [facility];  Service: Cardiovascular;  Laterality: N/A;  . TONSILLECTOMY     at age 68    Social History   Socioeconomic History  .  Marital status: Married    Spouse name: Not on file  . Number of children: 4  . Years of education: Not on file  . Highest education level: Not on file  Occupational History  . Not on file  Social Needs  . Financial resource strain: Not hard at all  . Food insecurity:    Worry: Never true    Inability: Never true  . Transportation needs:    Medical: No    Non-medical: No  Tobacco Use  . Smoking status: Former Smoker    Packs/day: 0.25    Years: 5.00    Pack years: 1.25  . Smokeless tobacco: Never Used  Substance and Sexual Activity  . Alcohol use: No  . Drug use: No  . Sexual activity: Yes  Lifestyle  . Physical activity:    Days per week: Not on file    Minutes per session: Not on file  . Stress: Not at all  Relationships  . Social connections:    Talks on phone: More than three times a week    Gets together: More than three times a week    Attends religious service: More than 4 times per year    Active member of club or organization: Yes    Attends meetings of clubs or organizations: More than 4 times per year    Relationship status: Married  . Intimate partner violence:    Fear of current or ex partner: No    Emotionally abused: No    Physically abused: No    Forced sexual activity: No  Other Topics Concern  . Not on file  Social History Narrative  . Not on file    Current Outpatient Medications on File Prior to Visit  Medication Sig Dispense Refill  . ALPRAZolam (XANAX) 0.25 MG tablet TAKE 1 TABLET BY MOUTH TWICE DAILY AS NEEDED. 30 tablet 0  . aspirin (ASPIRIN CHILDRENS) 81 MG chewable tablet Chew 1 tablet (81 mg total) by mouth daily. 90 tablet 3  . CALCIUM PO Take 1 tablet by mouth daily.    . carvedilol (COREG) 6.25 MG tablet Take 1 tablet (6.25 mg total) by mouth 2 (two) times daily with a meal. 90 tablet 1  . cetirizine (ZYRTEC) 10 MG tablet Take 10 mg by mouth at bedtime.    . ferrous gluconate (IRON 27) 240 (27 FE) MG tablet Take 240 mg by mouth 3  (three) times daily with meals.    . furosemide (LASIX) 20 MG tablet TAKE 1 TABLET BY MOUTH DAILY 90 tablet 0  . HYDROcodone-acetaminophen (NORCO/VICODIN) 5-325 MG tablet Take 1 tablet by mouth every 8 (eight) hours as needed for moderate pain. 20 tablet 0  . Investigational - Study Medication Take 1 tablet by mouth daily. Study name:008 Additional study details:For cholesterol (Dr. Skip Estimable)    . Multiple Vitamin (MULTIVITAMIN WITH MINERALS) TABS Take 1 tablet by mouth daily.    . nitroGLYCERIN (NITROSTAT) 0.4 MG SL tablet  Place 1 tablet (0.4 mg total) under the tongue every 5 (five) minutes x 3 doses as needed for chest pain. 30 tablet 3  . pregabalin (LYRICA) 75 MG capsule Take 1 capsule (75 mg total) by mouth 3 (three) times daily. 90 capsule 5  . tiZANidine (ZANAFLEX) 4 MG tablet Take 1 tablet by mouth 2 (two) times daily.    . valsartan (DIOVAN) 80 MG tablet Take 80 mg by mouth daily.     No current facility-administered medications on file prior to visit.     Review of Systems  Constitution: Negative for chills, decreased appetite, malaise/fatigue and weight gain.  Cardiovascular: Negative for leg swelling and syncope. Dyspnea on exertion: stable.  Endocrine: Negative for cold intolerance.  Hematologic/Lymphatic: Does not bruise/bleed easily.  Musculoskeletal: Negative for joint swelling.  Gastrointestinal: Positive for nausea and vomiting. Negative for abdominal pain, anorexia, change in bowel habit and melena.  Neurological: Negative for headaches and light-headedness.  Psychiatric/Behavioral: Negative for depression and substance abuse.  All other systems reviewed and are negative.     Objective:  Blood pressure (!) 116/48, pulse 66, height 5' 5.5" (1.664 m), weight 210 lb (95.3 kg). Body mass index is 34.41 kg/m. Limited exam due to virtual visit Her exam on 02/19/2018 was normal except for obesity and pannus in the abdomen and very loud bilateral carotid bruit but otherwise  vascular exam was normal.  Physical Exam  Constitutional: She is oriented to person, place, and time. No distress.  obese  HENT:  Head: Atraumatic.  Eyes: Conjunctivae are normal.  Neck:  Short neck  Pulmonary/Chest: Effort normal.  Neurological: She is alert and oriented to person, place, and time.  Psychiatric: She has a normal mood and affect.   Radiology: No results found. Laboratory Examination:    CMP Latest Ref Rng & Units 04/23/2018 01/16/2018 01/15/2018  Glucose 65 - 99 mg/dL 86 109(H) -  BUN 8 - 27 mg/dL 30(H) 26(H) -  Creatinine 0.57 - 1.00 mg/dL 1.95(H) 1.83(H) 1.57(H)  Sodium 134 - 144 mmol/L 144 141 -  Potassium 3.5 - 5.2 mmol/L 5.0 4.0 -  Chloride 96 - 106 mmol/L 104 109 -  CO2 20 - 29 mmol/L 23 23 -  Calcium 8.7 - 10.3 mg/dL 9.5 8.4(L) -  Total Protein 6.0 - 8.5 g/dL 6.4 - -  Total Bilirubin 0.0 - 1.2 mg/dL 0.3 - -  Alkaline Phos 39 - 117 IU/L 83 - -  AST 0 - 40 IU/L 18 - -  ALT 0 - 32 IU/L 8 - -   CBC Latest Ref Rng & Units 01/16/2018 01/15/2018 01/14/2018  WBC 4.0 - 10.5 K/uL 8.9 8.0 8.8  Hemoglobin 12.0 - 15.0 g/dL 10.0(L) 10.5(L) 11.7(L)  Hematocrit 36.0 - 46.0 % 32.8(L) 34.2(L) 38.9  Platelets 150 - 400 K/uL 329 329 383   Lipid Panel     Component Value Date/Time   CHOL 143 04/23/2018 1118   TRIG 64 04/23/2018 1118   HDL 45 04/23/2018 1118   CHOLHDL 3.2 04/23/2018 1118   LDLCALC 85 04/23/2018 1118   HEMOGLOBIN A1C Lab Results  Component Value Date   HGBA1C 5.6 04/23/2018   TSH No results for input(s): TSH in the last 8760 hours.  Cardiac studies:   Sleep Study [2011]: Negative for sleep apnea  Carotid Artery Duplex  01/01/2018: Stenosis in the right internal carotid artery (16-49%). Stenosis in the right common carotid artery (<50%). Stenosis in the right external carotid artery (<50%). Stenosis in the left internal  carotid artery (16-49%). Stenosis in the left common carotid artery (<50%). Stenosis in the left external carotid artery of  <50%. Bilateral diffuse heterogeneous plaque noted. Antegrade right vertebral artery flow. Antegrade left vertebral artery flow. Compared to the study done on 12/29/2016, no significant change noted. Consider f/u duplex in 1 yeaer if clinically indicated.  Coronary Angiogram  01/16/18: Patient Sypher stent in Cx, 2.75 x 15 mm DES placed on 01/22/08. Normal LVEF.  Echocardiogram 01/15/2018: Moderate LVH, LVEF 60-65%. Grade 2 diastolic dysfunction, elevated left atrial pressure. Trace aortic stenosis.  Calcified mitral annulus, mild mitral regurgitation. Mild left atrial dilatation. Mild RA/RV dilatation. PASP 40 mmHg.  Assessment:    Syncope and collapse  Non-intractable vomiting with nausea, unspecified vomiting type  Coronary artery disease of native artery of native heart with stable angina pectoris (HCC)  Microcytic anemia  Research study patient: Esperion (Bempidoic Acid) for statin intolerant patient  EKG 02/19/2018: Normal sinus rhythm at 67 bpm, normal axis, poor R-wave progression cannot exclude anterior infarct old. No evidence of ischemia.  Recommendations:    Patient's symptoms of nausea and vomiting followed by syncope clearly indicates vasovagal episode and do not suspect ACS or cardiac arrhythmias and etiology.  I simply reassured them.  This morning she is completely back to her baseline and feels well.  Hence no changes in the medications were done except I have discontinued her Plavix that was started for ACS due to presence of severe microcytic anemia and patient has been having difficulty taking iron pills due to severe constipation.  I do not know the etiology for I and deficiency anemia, she'll discuss this with Dr. Glendale Chard.  With regard to coronary artery disease, she is on appropriate medical therapy, she is not able to tolerate statins and is on ESPERION Study Drug.  In view of her cardiac issues, I'd like to see her back in the office in 2 weeks to 3  weeks for follow-up.  She'll call me if her symptoms were to recur, but also aware to go to the emergency room if necessary.  Adrian Prows, MD, [facility] 06/07/2018, 4:02 PM [facility]. [place] Pager: [phone] Office: [phone] If no answer Cell [phone]

## 2018-06-08 ENCOUNTER — Telehealth: Payer: Self-pay | Admitting: Internal Medicine

## 2018-06-08 NOTE — Telephone Encounter (Signed)
Patient has agreed to a virtual visit with Dr. Baird Cancer on 06/14/18

## 2018-06-13 DIAGNOSIS — N183 Chronic kidney disease, stage 3 (moderate): Secondary | ICD-10-CM | POA: Diagnosis not present

## 2018-06-13 DIAGNOSIS — I5032 Chronic diastolic (congestive) heart failure: Secondary | ICD-10-CM | POA: Diagnosis not present

## 2018-06-13 DIAGNOSIS — I129 Hypertensive chronic kidney disease with stage 1 through stage 4 chronic kidney disease, or unspecified chronic kidney disease: Secondary | ICD-10-CM | POA: Diagnosis not present

## 2018-06-13 DIAGNOSIS — R55 Syncope and collapse: Secondary | ICD-10-CM | POA: Diagnosis not present

## 2018-06-13 DIAGNOSIS — D631 Anemia in chronic kidney disease: Secondary | ICD-10-CM | POA: Diagnosis not present

## 2018-06-14 ENCOUNTER — Other Ambulatory Visit: Payer: Self-pay | Admitting: Cardiology

## 2018-06-14 ENCOUNTER — Ambulatory Visit: Payer: Medicare Other | Admitting: Internal Medicine

## 2018-06-14 DIAGNOSIS — R0989 Other specified symptoms and signs involving the circulatory and respiratory systems: Secondary | ICD-10-CM

## 2018-06-15 ENCOUNTER — Telehealth: Payer: Self-pay

## 2018-06-18 ENCOUNTER — Telehealth: Payer: Self-pay

## 2018-06-19 ENCOUNTER — Other Ambulatory Visit: Payer: Self-pay

## 2018-06-19 ENCOUNTER — Encounter: Payer: Self-pay | Admitting: Internal Medicine

## 2018-06-19 ENCOUNTER — Ambulatory Visit (INDEPENDENT_AMBULATORY_CARE_PROVIDER_SITE_OTHER): Payer: Medicare Other | Admitting: Internal Medicine

## 2018-06-19 VITALS — BP 153/63 | Ht 65.5 in

## 2018-06-19 DIAGNOSIS — R413 Other amnesia: Secondary | ICD-10-CM

## 2018-06-19 DIAGNOSIS — N183 Chronic kidney disease, stage 3 unspecified: Secondary | ICD-10-CM

## 2018-06-19 DIAGNOSIS — D631 Anemia in chronic kidney disease: Secondary | ICD-10-CM

## 2018-06-19 DIAGNOSIS — I1310 Hypertensive heart and chronic kidney disease without heart failure, with stage 1 through stage 4 chronic kidney disease, or unspecified chronic kidney disease: Secondary | ICD-10-CM | POA: Diagnosis not present

## 2018-06-19 DIAGNOSIS — E6609 Other obesity due to excess calories: Secondary | ICD-10-CM

## 2018-06-19 DIAGNOSIS — Z6834 Body mass index (BMI) 34.0-34.9, adult: Secondary | ICD-10-CM

## 2018-06-19 DIAGNOSIS — N189 Chronic kidney disease, unspecified: Secondary | ICD-10-CM

## 2018-06-20 ENCOUNTER — Other Ambulatory Visit: Payer: Self-pay | Admitting: Internal Medicine

## 2018-06-20 DIAGNOSIS — R413 Other amnesia: Secondary | ICD-10-CM | POA: Diagnosis not present

## 2018-06-20 DIAGNOSIS — I1310 Hypertensive heart and chronic kidney disease without heart failure, with stage 1 through stage 4 chronic kidney disease, or unspecified chronic kidney disease: Secondary | ICD-10-CM | POA: Diagnosis not present

## 2018-06-20 DIAGNOSIS — N183 Chronic kidney disease, stage 3 (moderate): Secondary | ICD-10-CM | POA: Diagnosis not present

## 2018-06-21 LAB — RPR QUALITATIVE: RPR Ser Ql: NONREACTIVE

## 2018-06-21 LAB — VITAMIN B12: Vitamin B-12: 578 pg/mL (ref 232–1245)

## 2018-06-21 LAB — CBC WITH DIFFERENTIAL/PLATELET
Basophils Absolute: 0 10*3/uL (ref 0.0–0.2)
Basos: 1 %
EOS (ABSOLUTE): 0.3 10*3/uL (ref 0.0–0.4)
Eos: 5 %
Hematocrit: 35.1 % (ref 34.0–46.6)
Hemoglobin: 11 g/dL — ABNORMAL LOW (ref 11.1–15.9)
Immature Grans (Abs): 0 10*3/uL (ref 0.0–0.1)
Immature Granulocytes: 0 %
Lymphocytes Absolute: 1.8 10*3/uL (ref 0.7–3.1)
Lymphs: 24 %
MCH: 22.9 pg — ABNORMAL LOW (ref 26.6–33.0)
MCHC: 31.3 g/dL — ABNORMAL LOW (ref 31.5–35.7)
MCV: 73 fL — ABNORMAL LOW (ref 79–97)
Monocytes Absolute: 0.7 10*3/uL (ref 0.1–0.9)
Monocytes: 9 %
Neutrophils Absolute: 4.6 10*3/uL (ref 1.4–7.0)
Neutrophils: 61 %
Platelets: 429 10*3/uL (ref 150–450)
RBC: 4.8 x10E6/uL (ref 3.77–5.28)
RDW: 14.9 % (ref 11.7–15.4)
WBC: 7.5 10*3/uL (ref 3.4–10.8)

## 2018-06-21 LAB — BASIC METABOLIC PANEL
BUN/Creatinine Ratio: 17 (ref 12–28)
BUN: 30 mg/dL — ABNORMAL HIGH (ref 8–27)
CO2: 24 mmol/L (ref 20–29)
Calcium: 9.6 mg/dL (ref 8.7–10.3)
Chloride: 103 mmol/L (ref 96–106)
Creatinine, Ser: 1.79 mg/dL — ABNORMAL HIGH (ref 0.57–1.00)
GFR calc Af Amer: 31 mL/min/{1.73_m2} — ABNORMAL LOW (ref >59)
GFR calc non Af Amer: 27 mL/min/{1.73_m2} — ABNORMAL LOW (ref >59)
Glucose: 94 mg/dL (ref 65–99)
Potassium: 5.1 mmol/L (ref 3.5–5.2)
Sodium: 145 mmol/L — ABNORMAL HIGH (ref 134–144)

## 2018-06-21 LAB — IRON AND TIBC
Iron Saturation: 27 % (ref 15–55)
Iron: 78 ug/dL (ref 27–139)
Total Iron Binding Capacity: 290 ug/dL (ref 250–450)
UIBC: 212 ug/dL (ref 118–369)

## 2018-06-21 LAB — TSH: TSH: 1.72 u[IU]/mL (ref 0.450–4.500)

## 2018-06-21 LAB — FERRITIN: Ferritin: 166 ng/mL — ABNORMAL HIGH (ref 15–150)

## 2018-06-22 ENCOUNTER — Other Ambulatory Visit: Payer: Self-pay

## 2018-06-22 ENCOUNTER — Telehealth: Payer: Self-pay

## 2018-06-22 ENCOUNTER — Ambulatory Visit (INDEPENDENT_AMBULATORY_CARE_PROVIDER_SITE_OTHER): Payer: Medicare Other | Admitting: Cardiology

## 2018-06-22 ENCOUNTER — Encounter: Payer: Self-pay | Admitting: Cardiology

## 2018-06-22 VITALS — BP 127/64 | HR 76 | Ht 65.5 in | Wt 210.0 lb

## 2018-06-22 DIAGNOSIS — I25118 Atherosclerotic heart disease of native coronary artery with other forms of angina pectoris: Secondary | ICD-10-CM

## 2018-06-22 DIAGNOSIS — R55 Syncope and collapse: Secondary | ICD-10-CM | POA: Diagnosis not present

## 2018-06-22 DIAGNOSIS — Z006 Encounter for examination for normal comparison and control in clinical research program: Secondary | ICD-10-CM

## 2018-06-22 NOTE — Progress Notes (Signed)
Virtual Visit via Video Note: This visit type was conducted due to national recommendations for restrictions regarding the COVID-19 Pandemic (e.g. social distancing).  This format is felt to be most appropriate for this patient at this time.  All issues noted in this document were discussed and addressed.  No physical exam was performed (except for noted visual exam findings with Telehealth visits).  The patient has consented to conduct a Telehealth visit and understands insurance will be billed.   I connected with@, on 06/22/18 at  by a video enabled telemedicine application and verified that I am speaking with the correct person using two identifiers.   I discussed the limitations of evaluation and management by telemedicine and the availability of in person appointments. The patient expressed understanding and agreed to proceed.   I have discussed with patient regarding the safety during COVID Pandemic and steps and precautions to be taken including social distancing, frequent hand wash and use of detergent soap, gels with the patient. I asked the patient to avoid touching mouth, nose, eyes, ears with the hands. I encouraged regular walking around the neighborhood and exercise and regular diet, as long as social distancing can be maintained.  Primary Physician/Referring:  Glendale Chard, MD  Patient ID: Kathryn [name], female    DOB: Jul 14, 1940, 78 y.o.   MRN: [id number]  Chief Complaint  Patient presents with  . Loss of Consciousness    HPI: Kathryn [name]  is a 78 y.o. female  with CAD s/p PCI to LCx 2009, hypertension, statin intolerant hyperlipidemia, CKD stage 3, was admitted to the hospital in 01/2018 with complaints of chest pain and diagnosed with non-STEMI peak troponin 0.31 and she eventually underwent cath on 01/16/18 that showed no obstructive CAD, medical management. Patient is not on statin due to intolerance, she is enrolled in esperion trial.  Past medical history  significant for hypertension, hyperlipidemia, obesity and mild asymptomatic carotid artery stenosis bilateral.  She was seen on 06/07/2018 for syncope preceding nausea and vomiting and abdominal discomfort.  I set up a which are visit to follow-up on her symptoms.  She has not had any nausea or vomiting.  State that she feels she is back to baseline. Denies any chest pain or worsening dyspnea or leg edema. No fever, no chills.  Past Medical History:  Diagnosis Date  . Anxiety    takes Xanax bid prn  . Bruises easily    pt is on Plavix  . Chronic back pain    buldging disc  . Chronic neck pain   . Coronary artery disease    1 stent   . Dysphagia   . Headache(784.0)    related to cervical issues  . History of colonic polyps   . Hyperlipidemia    takes Crestor daily  . Hypertension    takes Coreg and Diovan daily  . Osteoporosis    was taking shot 2xyr;but not taking anymore  . Peripheral vascular disease ([place])   . Seasonal allergies    takes Zyrtec nightly    Past Surgical History:  Procedure Laterality Date  . ABDOMINAL HYSTERECTOMY  70's  . BUNIONECTOMY     left foot  . CARDIAC CATHETERIZATION  2009/2011   1 stent placed  . CHOLECYSTECTOMY  2009  . COLONOSCOPY    . ESOPHAGOGASTRODUODENOSCOPY    . LEFT HEART CATH AND CORONARY ANGIOGRAPHY N/A 01/16/2018   Procedure: LEFT HEART CATH AND CORONARY ANGIOGRAPHY;  Surgeon: Nigel Mormon, MD;  Location: [facility];  Service: Cardiovascular;  Laterality: N/A;  . TONSILLECTOMY     at age 29    Social History   Socioeconomic History  . Marital status: Married    Spouse name: Not on file  . Number of children: 4  . Years of education: Not on file  . Highest education level: Not on file  Occupational History  . Not on file  Social Needs  . Financial resource strain: Not hard at all  . Food insecurity:    Worry: Never true    Inability: Never true  . Transportation needs:    Medical: No    Non-medical: No   Tobacco Use  . Smoking status: Former Smoker    Packs/day: 0.25    Years: 5.00    Pack years: 1.25  . Smokeless tobacco: Never Used  Substance and Sexual Activity  . Alcohol use: No  . Drug use: No  . Sexual activity: Yes  Lifestyle  . Physical activity:    Days per week: Not on file    Minutes per session: Not on file  . Stress: Not at all  Relationships  . Social connections:    Talks on phone: More than three times a week    Gets together: More than three times a week    Attends religious service: More than 4 times per year    Active member of club or organization: Yes    Attends meetings of clubs or organizations: More than 4 times per year    Relationship status: Married  . Intimate partner violence:    Fear of current or ex partner: No    Emotionally abused: No    Physically abused: No    Forced sexual activity: No  Other Topics Concern  . Not on file  Social History Narrative  . Not on file    Current Outpatient Medications on File Prior to Visit  Medication Sig Dispense Refill  . ALPRAZolam (XANAX) 0.25 MG tablet TAKE 1 TABLET BY MOUTH TWICE DAILY AS NEEDED. 30 tablet 0  . aspirin (ASPIRIN CHILDRENS) 81 MG chewable tablet Chew 1 tablet (81 mg total) by mouth daily. 90 tablet 3  . CALCIUM PO Take 1 tablet by mouth daily.    . carvedilol (COREG) 6.25 MG tablet Take 1 tablet (6.25 mg total) by mouth 2 (two) times daily with a meal. 90 tablet 1  . cetirizine (ZYRTEC) 10 MG tablet Take 10 mg by mouth at bedtime.    . ferrous gluconate (IRON 27) 240 (27 FE) MG tablet Take 240 mg by mouth 2 (two) times daily.     . furosemide (LASIX) 20 MG tablet TAKE 1 TABLET BY MOUTH DAILY 90 tablet 0  . HYDROcodone-acetaminophen (NORCO/VICODIN) 5-325 MG tablet Take 1 tablet by mouth every 8 (eight) hours as needed for moderate pain. 20 tablet 0  . Investigational - Study Medication Take 1 tablet by mouth daily. Study name:008 Additional study details:For cholesterol (Dr. Skip Estimable)    .  Multiple Vitamin (MULTIVITAMIN WITH MINERALS) TABS Take 1 tablet by mouth daily.    . nitroGLYCERIN (NITROSTAT) 0.4 MG SL tablet Place 1 tablet (0.4 mg total) under the tongue every 5 (five) minutes x 3 doses as needed for chest pain. 30 tablet 3  . pregabalin (LYRICA) 75 MG capsule Take 1 capsule (75 mg total) by mouth 3 (three) times daily. 90 capsule 5  . tiZANidine (ZANAFLEX) 4 MG tablet Take 1 tablet by mouth 2 (two) times daily.    [name]  valsartan (DIOVAN) 80 MG tablet Take 80 mg by mouth daily.     No current facility-administered medications on file prior to visit.     Review of Systems  Constitution: Negative for chills, decreased appetite, malaise/fatigue and weight gain.  Cardiovascular: Negative for leg swelling and syncope. Dyspnea on exertion: stable.  Endocrine: Negative for cold intolerance.  Hematologic/Lymphatic: Does not bruise/bleed easily.  Musculoskeletal: Negative for joint swelling.  Gastrointestinal: Negative for abdominal pain, anorexia, change in bowel habit, melena and nausea.  Neurological: Negative for headaches and light-headedness.  Psychiatric/Behavioral: Negative for depression and substance abuse.  All other systems reviewed and are negative.     Objective:  Blood pressure 127/64, pulse 76, height 5' 5.5" (1.664 m), weight 210 lb (95.3 kg). Body mass index is 34.41 kg/m.  Physical exam not performed or limited due to virtual visit.  Patient appeared to be in no distress, Neck was supple, respiration was not labored.  Please see exam details from prior visit is as below.   Her exam on 02/19/2018  Physical Exam  Constitutional: She is oriented to person, place, and time. She appears well-developed and well-nourished. No distress.  obese  HENT:  Head: Atraumatic.  Eyes: Conjunctivae are normal.  Neck: Neck supple. No JVD present. No thyromegaly present.  Short neck  Cardiovascular: Normal rate, regular rhythm, normal heart sounds and intact distal  pulses. Exam reveals no gallop.  No murmur heard. Pulses:      Carotid pulses are on the right side with bruit and on the left side with bruit. Pitting Edema: 2+ bilateral  Pulmonary/Chest: Effort normal and breath sounds normal.  Abdominal: Soft. Bowel sounds are normal.  Obese with mild pannus  Musculoskeletal: Normal range of motion.  Neurological: She is alert and oriented to person, place, and time.  Skin: Skin is warm and dry.  Psychiatric: She has a normal mood and affect.   Radiology: No results found. Laboratory Examination:    CMP Latest Ref Rng & Units 04/23/2018 01/16/2018 01/15/2018  Glucose 65 - 99 mg/dL 86 109(H) -  BUN 8 - 27 mg/dL 30(H) 26(H) -  Creatinine 0.57 - 1.00 mg/dL 1.95(H) 1.83(H) 1.57(H)  Sodium 134 - 144 mmol/L 144 141 -  Potassium 3.5 - 5.2 mmol/L 5.0 4.0 -  Chloride 96 - 106 mmol/L 104 109 -  CO2 20 - 29 mmol/L 23 23 -  Calcium 8.7 - 10.3 mg/dL 9.5 8.4(L) -  Total Protein 6.0 - 8.5 g/dL 6.4 - -  Total Bilirubin 0.0 - 1.2 mg/dL 0.3 - -  Alkaline Phos 39 - 117 IU/L 83 - -  AST 0 - 40 IU/L 18 - -  ALT 0 - 32 IU/L 8 - -   CBC Latest Ref Rng & Units 01/16/2018 01/15/2018 01/14/2018  WBC 4.0 - 10.5 K/uL 8.9 8.0 8.8  Hemoglobin 12.0 - 15.0 g/dL 10.0(L) 10.5(L) 11.7(L)  Hematocrit 36.0 - 46.0 % 32.8(L) 34.2(L) 38.9  Platelets 150 - 400 K/uL 329 329 383   Lipid Panel     Component Value Date/Time   CHOL 143 04/23/2018 1118   TRIG 64 04/23/2018 1118   HDL 45 04/23/2018 1118   CHOLHDL 3.2 04/23/2018 1118   LDLCALC 85 04/23/2018 1118   HEMOGLOBIN A1C Lab Results  Component Value Date   HGBA1C 5.6 04/23/2018   TSH No results for input(s): TSH in the last 8760 hours.  Cardiac studies:   Sleep Study [2011]: Negative for sleep apnea  Carotid Artery Duplex  01/01/2018:  Stenosis in the right internal carotid artery (16-49%). Stenosis in the right common carotid artery (<50%). Stenosis in the right external carotid artery (<50%). Stenosis in the  left internal carotid artery (16-49%). Stenosis in the left common carotid artery (<50%). Stenosis in the left external carotid artery of <50%. Bilateral diffuse heterogeneous plaque noted. Antegrade right vertebral artery flow. Antegrade left vertebral artery flow. Compared to the study done on 12/29/2016, no significant change noted. Consider f/u duplex in 1 yeaer if clinically indicated.  Coronary Angiogram  01/16/18: Patient Sypher stent in Cx, 2.75 x 15 mm DES placed on 01/22/08. Normal LVEF.  Echocardiogram 01/15/2018: Moderate LVH, LVEF 60-65%. Grade 2 diastolic dysfunction, elevated left atrial pressure. Trace aortic stenosis.  Calcified mitral annulus, mild mitral regurgitation. Mild left atrial dilatation. Mild RA/RV dilatation. PASP 40 mmHg.  Assessment:    Syncope and collapse  Coronary artery disease of native artery of native heart with stable angina pectoris ([place])  Research study patient: Esperion (Bempidoic Acid) for statin intolerant patient  EKG 02/19/2018: Normal sinus rhythm at 67 bpm, normal axis, poor R-wave progression cannot exclude anterior infarct old. No evidence of ischemia.  Recommendations:    Patient was seen on 06/07/2018 due to severe nausea and vomiting followed by syncope felt to be vasovagal. I had discontinued Plavix as patient's recent cardiac catheterization had revealed widely patent stent and a do not suspect ACS.  She has not had any recurrence of syncope and presently back to baseline without nausea or vomiting.  With regard to coronary artery disease, she is on appropriate medical therapy, she is not able to tolerate statins and is on ESPERION Study Drug. She'll call me if her symptoms were to recur, for now I'll see her back in the office in 2 months that was previously scheduled for a routine visit for follow-up of CAD and hypertension.  Today the blood pressure is well controlled.  She does have chronic iron deficiency anemia and may need  further follow-up.  Adrian Prows, MD, [facility] 06/22/2018, 11:26 AM [place] [facility]. [place] Pager: [phone] Office: [phone] If no answer Cell [phone]

## 2018-06-24 ENCOUNTER — Encounter: Payer: Self-pay | Admitting: Internal Medicine

## 2018-06-24 NOTE — Progress Notes (Signed)
Virtual Visit via Video   This visit type was conducted due to national recommendations for restrictions regarding the COVID-19 Pandemic (e.g. social distancing) in an effort to limit this patient's exposure and mitigate transmission in our community.  Due to her co-morbid illnesses, this patient is at least at moderate risk for complications without adequate follow up.  This format is felt to be most appropriate for this patient at this time.  All issues noted in this document were discussed and addressed.  A limited physical exam was performed with this format.    This visit type was conducted due to national recommendations for restrictions regarding the COVID-19 Pandemic (e.g. social distancing) in an effort to limit this patient's exposure and mitigate transmission in our community.  Patients identity confirmed using two different identifiers.  This format is felt to be most appropriate for this patient at this time.  All issues noted in this document were discussed and addressed.  No physical exam was performed (except for noted visual exam findings with Video Visits).    Date:  06/24/2018   ID:  Kathryn [name], DOB February 14, 1941, MRN [id number]  Patient Location:  Home, accompanied by her daughter Kathryn [name]   Provider location:   Office    Chief Complaint:  Blood pressure f/u  History of Present Illness:    Kathryn [name] is a 78 y.o. female who presents via video conferencing for a telehealth visit today.    The patient does not have symptoms concerning for COVID-19 infection (fever, chills, cough, or new shortness of breath).   She presents today for virtual visit. She prefers this method of contact due to COVID-19 pandemic.  She reports she has been following stay at home orders.  She presents today for blood pressure check. She reports compliance with meds. Unfortunately, since her last visit, her sister passed. She reports she had multiple medical illnesses.     Past  Medical History:  Diagnosis Date  . Anxiety    takes Xanax bid prn  . Bruises easily    pt is on Plavix  . Chronic back pain    buldging disc  . Chronic neck pain   . Coronary artery disease    1 stent   . Dysphagia   . Headache(784.0)    related to cervical issues  . History of colonic polyps   . Hyperlipidemia    takes Crestor daily  . Hypertension    takes Coreg and Diovan daily  . Osteoporosis    was taking shot 2xyr;but not taking anymore  . Peripheral vascular disease ([place])   . Seasonal allergies    takes Zyrtec nightly   Past Surgical History:  Procedure Laterality Date  . ABDOMINAL HYSTERECTOMY  70's  . BUNIONECTOMY     left foot  . CARDIAC CATHETERIZATION  2009/2011   1 stent placed  . CHOLECYSTECTOMY  2009  . COLONOSCOPY    . ESOPHAGOGASTRODUODENOSCOPY    . LEFT HEART CATH AND CORONARY ANGIOGRAPHY N/A 01/16/2018   Procedure: LEFT HEART CATH AND CORONARY ANGIOGRAPHY;  Surgeon: Nigel Mormon, MD;  Location: [facility];  Service: Cardiovascular;  Laterality: N/A;  . TONSILLECTOMY     at age 29     Current Meds  Medication Sig  . ALPRAZolam (XANAX) 0.25 MG tablet TAKE 1 TABLET BY MOUTH TWICE DAILY AS NEEDED.  [name] aspirin (ASPIRIN CHILDRENS) 81 MG chewable tablet Chew 1 tablet (81 mg total) by mouth daily.  [name] CALCIUM PO  Take 1 tablet by mouth daily.  . carvedilol (COREG) 6.25 MG tablet Take 1 tablet (6.25 mg total) by mouth 2 (two) times daily with a meal.  . cetirizine (ZYRTEC) 10 MG tablet Take 10 mg by mouth at bedtime.  . ferrous gluconate (IRON 27) 240 (27 FE) MG tablet Take 240 mg by mouth 2 (two) times daily.   . furosemide (LASIX) 20 MG tablet TAKE 1 TABLET BY MOUTH DAILY  . Investigational - Study Medication Take 1 tablet by mouth daily. Study name:008 Additional study details:For cholesterol (Dr. Skip Estimable)  . Multiple Vitamin (MULTIVITAMIN WITH MINERALS) TABS Take 1 tablet by mouth daily.  . nitroGLYCERIN (NITROSTAT) 0.4 MG SL tablet Place  1 tablet (0.4 mg total) under the tongue every 5 (five) minutes x 3 doses as needed for chest pain.  . pregabalin (LYRICA) 75 MG capsule Take 1 capsule (75 mg total) by mouth 3 (three) times daily.  [name] tiZANidine (ZANAFLEX) 4 MG tablet Take 1 tablet by mouth 2 (two) times daily.  . valsartan (DIOVAN) 80 MG tablet Take 80 mg by mouth daily.     Allergies:   Codeine and Shellfish allergy   Social History   Tobacco Use  . Smoking status: Former Smoker    Packs/day: 0.25    Years: 5.00    Pack years: 1.25  . Smokeless tobacco: Never Used  Substance Use Topics  . Alcohol use: No  . Drug use: No     Family Hx: The patient's family history includes Cancer in her brother, father, and sister; Dementia in an other family member; Heart attack in her brother, brother, and mother; Heart disease in her brother, brother, and mother; Leukemia in her sister. There is no history of Anesthesia problems, Hypotension, Malignant hyperthermia, or Pseudochol deficiency.  ROS:   Please see the history of present illness.    Review of Systems  Constitutional: Negative.   Respiratory: Negative.   Cardiovascular: Negative.   Gastrointestinal: Negative.   Neurological: Negative.   Psychiatric/Behavioral: Negative.     All other systems reviewed and are negative.   Labs/Other Tests and Data Reviewed:    Recent Labs: 01/14/2018: B Natriuretic Peptide 50.3 01/16/2018: Hemoglobin 10.0; Platelets 329 04/23/2018: ALT 8; BUN 30; Creatinine, Ser 1.95; Potassium 5.0; Sodium 144   Recent Lipid Panel Lab Results  Component Value Date/Time   CHOL 143 04/23/2018 11:18 AM   TRIG 64 04/23/2018 11:18 AM   HDL 45 04/23/2018 11:18 AM   CHOLHDL 3.2 04/23/2018 11:18 AM   LDLCALC 85 04/23/2018 11:18 AM    Wt Readings from Last 3 Encounters:  06/22/18 210 lb (95.3 kg)  06/07/18 210 lb (95.3 kg)  04/23/18 225 lb (102.1 kg)     Exam:    Vital Signs:  BP (!) 153/63 (BP Location: Left Arm, Patient Position:  Sitting, Cuff Size: Normal)   Ht 5' 5.5" (1.664 m)   BMI 34.41 kg/m     Physical Exam  Constitutional: She is oriented to person, place, and time and well-developed, well-nourished, and in no distress.  HENT:  Head: Normocephalic and atraumatic.  Neck: Normal range of motion.  Pulmonary/Chest: Effort normal.  Neurological: She is alert and oriented to person, place, and time.  Psychiatric: Affect normal.  Nursing note and vitals reviewed.   ASSESSMENT & PLAN:     1. Hypertensive heart and renal disease with renal failure, stage 1 through stage 4 or unspecified chronic kidney disease, without heart failure  Uncontrolled.  Importance of medication  compliance was discussed with the patient.  She will continue with current meds for now. She is encouraged to avoid adding salt to her foods. She will rto in four months for re-evaluation.   - BMP8+EGFR; Future  2. Chronic kidney disease, stage III (moderate) (HCC)  Chronic. I will check a GFR, Cr today. She is encouraged to stay well hydrated.   3. Anemia associated with chronic renal failure  I will check iron studies as listed below. I will make further recommendations once her labs are available for review.   - CBC no Diff; Future - Iron and IBC ([id number]); Future - Ferritin; Future  4. Memory loss  Possibly related to grief. She does not wish to pursue grief counseling at this time.   5. Class 1 obesity due to excess calories with serious comorbidity and body mass index (BMI) of 34.0 to 34.9 in adult  Importance of achieving optimal weight to decrease risk of cardiovascular disease and cancers was discussed with the patient in full detail. She is encouraged to start slowly - start with 10 minutes twice daily at least three to four days per week and to gradually build to 30 minutes five days weekly. She was given tips to incorporate more activity into her daily routine - take stairs when possible, park farther away from  grocery stores, etc.      COVID-19 Education: The signs and symptoms of COVID-19 were discussed with the patient and how to seek care for testing (follow up with PCP or arrange E-visit).  The importance of social distancing was discussed today.  Patient Risk:   After full review of this patients clinical status, I feel that they are at least moderate risk at this time.  Time:   Today, I have spent 17 minutes/ 24 seconds with the patient with telehealth technology discussing above diagnoses.     Medication Adjustments/Labs and Tests Ordered: Current medicines are reviewed at length with the patient today.  Concerns regarding medicines are outlined above.   Tests Ordered: Orders Placed This Encounter  Procedures  . CBC no Diff  . Iron and IBC ([id number])  . Ferritin  . BMP8+EGFR    Medication Changes: No orders of the defined types were placed in this encounter.   Disposition:  Follow up in 4 month(s)  Signed, [name], MD

## 2018-06-26 ENCOUNTER — Telehealth: Payer: Self-pay

## 2018-06-26 NOTE — Telephone Encounter (Signed)
-----   Message from Glendale Chard, MD sent at 06/25/2018  3:14 PM EDT ----- Your blood count is sl decreased, but has improved. Kidney fxn is stable. Sodium level is sl. Elevated, how much water does she drink daily? Your thyroid fxn is nl. Syphilis test is neg. Vit b12 leve lis wnl.

## 2018-06-26 NOTE — Telephone Encounter (Signed)
Left the patient a message to call back for lab results. 

## 2018-06-28 DIAGNOSIS — M5416 Radiculopathy, lumbar region: Secondary | ICD-10-CM | POA: Diagnosis not present

## 2018-07-05 ENCOUNTER — Telehealth: Payer: Self-pay

## 2018-07-10 ENCOUNTER — Other Ambulatory Visit: Payer: Self-pay

## 2018-07-16 ENCOUNTER — Telehealth: Payer: Self-pay

## 2018-07-19 ENCOUNTER — Other Ambulatory Visit: Payer: Self-pay | Admitting: Nurse Practitioner

## 2018-07-19 DIAGNOSIS — F419 Anxiety disorder, unspecified: Secondary | ICD-10-CM

## 2018-08-02 ENCOUNTER — Telehealth: Payer: Self-pay

## 2018-08-03 ENCOUNTER — Other Ambulatory Visit: Payer: Self-pay | Admitting: Nurse Practitioner

## 2018-08-03 DIAGNOSIS — F419 Anxiety disorder, unspecified: Secondary | ICD-10-CM

## 2018-08-10 ENCOUNTER — Telehealth: Payer: Self-pay

## 2018-08-10 NOTE — Telephone Encounter (Signed)
Xanax refill 

## 2018-08-13 NOTE — Telephone Encounter (Signed)
Can you forward to Dr. Baird Cancer to see if she wants to refill

## 2018-08-13 NOTE — Telephone Encounter (Signed)
Kathryn [name] asked me to forward to you to see if you wanted to refill her xanax

## 2018-08-20 ENCOUNTER — Ambulatory Visit (INDEPENDENT_AMBULATORY_CARE_PROVIDER_SITE_OTHER): Payer: Medicare Other | Admitting: Cardiology

## 2018-08-20 ENCOUNTER — Encounter: Payer: Self-pay | Admitting: Cardiology

## 2018-08-20 ENCOUNTER — Ambulatory Visit (INDEPENDENT_AMBULATORY_CARE_PROVIDER_SITE_OTHER): Payer: Medicare Other | Admitting: Internal Medicine

## 2018-08-20 ENCOUNTER — Other Ambulatory Visit: Payer: Self-pay

## 2018-08-20 ENCOUNTER — Encounter: Payer: Self-pay | Admitting: Internal Medicine

## 2018-08-20 VITALS — BP 162/84 | HR 69 | Temp 98.4°F | Ht 62.0 in | Wt 222.0 lb

## 2018-08-20 VITALS — BP 156/70 | Ht 65.5 in | Wt 218.0 lb

## 2018-08-20 DIAGNOSIS — I10 Essential (primary) hypertension: Secondary | ICD-10-CM | POA: Diagnosis not present

## 2018-08-20 DIAGNOSIS — M5431 Sciatica, right side: Secondary | ICD-10-CM

## 2018-08-20 DIAGNOSIS — M25551 Pain in right hip: Secondary | ICD-10-CM

## 2018-08-20 DIAGNOSIS — N183 Chronic kidney disease, stage 3 unspecified: Secondary | ICD-10-CM

## 2018-08-20 DIAGNOSIS — I1310 Hypertensive heart and chronic kidney disease without heart failure, with stage 1 through stage 4 chronic kidney disease, or unspecified chronic kidney disease: Secondary | ICD-10-CM | POA: Diagnosis not present

## 2018-08-20 DIAGNOSIS — Z6841 Body Mass Index (BMI) 40.0 and over, adult: Secondary | ICD-10-CM

## 2018-08-20 DIAGNOSIS — E7800 Pure hypercholesterolemia, unspecified: Secondary | ICD-10-CM

## 2018-08-20 DIAGNOSIS — Z006 Encounter for examination for normal comparison and control in clinical research program: Secondary | ICD-10-CM

## 2018-08-20 DIAGNOSIS — F4321 Adjustment disorder with depressed mood: Secondary | ICD-10-CM

## 2018-08-20 DIAGNOSIS — G8929 Other chronic pain: Secondary | ICD-10-CM | POA: Diagnosis not present

## 2018-08-20 DIAGNOSIS — I25118 Atherosclerotic heart disease of native coronary artery with other forms of angina pectoris: Secondary | ICD-10-CM

## 2018-08-20 NOTE — Progress Notes (Signed)
Virtual Visit via Telephone Note: Patient unable to use video assisted device.  This visit type was conducted due to national recommendations for restrictions regarding the COVID-19 Pandemic (e.g. social distancing).  This format is felt to be most appropriate for this patient at this time.  All issues noted in this document were discussed and addressed.  No physical exam was performed.  The patient has consented to conduct a Telehealth visit and understands insurance will be billed.   I connected with@, on 08/20/18 at  by TELEPHONE and verified that I am speaking with the correct person using two identifiers.   I discussed the limitations of evaluation and management by telemedicine and the availability of in person appointments. The patient expressed understanding and agreed to proceed.   I have discussed with patient regarding the safety during COVID Pandemic and steps and precautions to be taken including social distancing, frequent hand wash and use of detergent soap, gels with the patient. I asked the patient to avoid touching mouth, nose, eyes, ears with the hands. I encouraged regular walking around the neighborhood and exercise and regular diet, as long as social distancing can be maintained.   Primary Physician/Referring:  Glendale Chard, MD  Patient ID: Kathryn [name], female    DOB: 1940-04-21, 78 y.o.   MRN: [id number]  Chief Complaint  Patient presents with  . Coronary Artery Disease  . Congestive Heart Failure    diastolic    HPI: Kathryn GATLIFF  is a 78 y.o. female  with CAD s/p PCI to LCx 2009, hypertension, statin intolerant hyperlipidemia, CKD stage 3, was admitted to the hospital in 01/2018 with complaints of chest pain and diagnosed with non-STEMI peak troponin 0.31 and she eventually underwent cath on 01/16/18 that showed no obstructive CAD, medical management. Patient is not on statin due to intolerance, she is enrolled in esperion trial.  Past medical  history significant for hypertension, hyperlipidemia, obesity and mild asymptomatic carotid artery stenosis bilateral.  She was seen on 06/07/2018 for syncope preceding nausea and vomiting and abdominal discomfort.  Presently doing well and no abdominal pain, dizziness or syncope. Denies any chest pain or worsening dyspnea or leg edema. No fever, no chills. Main complaint is back pain and chest pain at the prior herpes Zoster infection.  Past Medical History:  Diagnosis Date  . Anxiety    takes Xanax bid prn  . Bruises easily    pt is on Plavix  . Chronic back pain    buldging disc  . Chronic neck pain   . Coronary artery disease    1 stent   . Dysphagia   . Headache(784.0)    related to cervical issues  . History of colonic polyps   . Hyperlipidemia    takes Crestor daily  . Hypertension    takes Coreg and Diovan daily  . Osteoporosis    was taking shot 2xyr;but not taking anymore  . Peripheral vascular disease ([place])   . Seasonal allergies    takes Zyrtec nightly    Past Surgical History:  Procedure Laterality Date  . ABDOMINAL HYSTERECTOMY  70's  . BUNIONECTOMY     left foot  . CARDIAC CATHETERIZATION  2009/2011   1 stent placed  . CHOLECYSTECTOMY  2009  . COLONOSCOPY    . ESOPHAGOGASTRODUODENOSCOPY    . LEFT HEART CATH AND CORONARY ANGIOGRAPHY N/A 01/16/2018   Procedure: LEFT HEART CATH AND CORONARY ANGIOGRAPHY;  Surgeon: Nigel Mormon, MD;  Location: [facility];  Service: Cardiovascular;  Laterality: N/A;  . TONSILLECTOMY     at age 1    Social History   Socioeconomic History  . Marital status: Married    Spouse name: Not on file  . Number of children: 4  . Years of education: Not on file  . Highest education level: Not on file  Occupational History  . Not on file  Social Needs  . Financial resource strain: Not hard at all  . Food insecurity    Worry: Never true    Inability: Never true  . Transportation needs    Medical: No     Non-medical: No  Tobacco Use  . Smoking status: Former Smoker    Packs/day: 0.25    Years: 5.00    Pack years: 1.25  . Smokeless tobacco: Never Used  Substance and Sexual Activity  . Alcohol use: No  . Drug use: No  . Sexual activity: Yes  Lifestyle  . Physical activity    Days per week: Not on file    Minutes per session: Not on file  . Stress: Not at all  Relationships  . Social connections    Talks on phone: More than three times a week    Gets together: More than three times a week    Attends religious service: More than 4 times per year    Active member of club or organization: Yes    Attends meetings of clubs or organizations: More than 4 times per year    Relationship status: Married  . Intimate partner violence    Fear of current or ex partner: No    Emotionally abused: No    Physically abused: No    Forced sexual activity: No  Other Topics Concern  . Not on file  Social History Narrative  . Not on file    Current Outpatient Medications on File Prior to Visit  Medication Sig Dispense Refill  . aspirin (ASPIRIN CHILDRENS) 81 MG chewable tablet Chew 1 tablet (81 mg total) by mouth daily. 90 tablet 3  . CALCIUM PO Take 1 tablet by mouth daily. 600 mg    . carvedilol (COREG) 6.25 MG tablet TAKE (1) TABLET BY MOUTH TWICE DAILY. 90 tablet 0  . cetirizine (ZYRTEC) 10 MG tablet Take 10 mg by mouth at bedtime.    . ferrous gluconate (IRON 27) 240 (27 FE) MG tablet Take 240 mg by mouth daily.     . furosemide (LASIX) 20 MG tablet TAKE 1 TABLET BY MOUTH DAILY 90 tablet 0  . Investigational - Study Medication Take 1 tablet by mouth daily. Study name:008 Additional study details:For cholesterol (Dr. Skip Estimable)    . Multiple Vitamin (MULTIVITAMIN WITH MINERALS) TABS Take 1 tablet by mouth daily.    . nitroGLYCERIN (NITROSTAT) 0.4 MG SL tablet Place 1 tablet (0.4 mg total) under the tongue every 5 (five) minutes x 3 doses as needed for chest pain. 30 tablet 3  . pregabalin  (LYRICA) 75 MG capsule Take 1 capsule (75 mg total) by mouth 3 (three) times daily. 90 capsule 5   No current facility-administered medications on file prior to visit.    Review of Systems  Constitution: Negative for chills, decreased appetite, malaise/fatigue and weight gain.  Cardiovascular: Positive for chest pain (left sided post herpetic and chronic). Negative for leg swelling and syncope. Dyspnea on exertion: stable.  Endocrine: Negative for cold intolerance.  Hematologic/Lymphatic: Does not bruise/bleed easily.  Musculoskeletal: Positive for back pain (bulging disc).  Negative for joint swelling.  Gastrointestinal: Negative for abdominal pain, anorexia, change in bowel habit, melena and nausea.  Neurological: Negative for headaches and light-headedness.  Psychiatric/Behavioral: Negative for depression and substance abuse.  All other systems reviewed and are negative.     Objective:  Blood pressure (!) 156/70, height 5' 5.5" (1.664 m), weight 218 lb (98.9 kg). Body mass index is 35.73 kg/m.  Physical exam not performed or limited due to virtual visit.  Prior exam is as below   Physical Exam  Constitutional: She is oriented to person, place, and time. She appears well-developed and well-nourished. No distress.  obese  HENT:  Head: Atraumatic.  Eyes: Conjunctivae are normal.  Neck: Neck supple. No JVD present. No thyromegaly present.  Short neck  Cardiovascular: Normal rate, regular rhythm, normal heart sounds and intact distal pulses. Exam reveals no gallop.  No murmur heard. Pulses:      Carotid pulses are on the right side with bruit and on the left side with bruit. Pitting Edema: 2+ bilateral  Pulmonary/Chest: Effort normal and breath sounds normal.  Abdominal: Soft. Bowel sounds are normal.  Obese with mild pannus  Musculoskeletal: Normal range of motion.  Neurological: She is alert and oriented to person, place, and time.  Skin: Skin is warm and dry.  Psychiatric:  She has a normal mood and affect.   Radiology: No results found. Laboratory Examination:   CMP Latest Ref Rng & Units 06/20/2018 04/23/2018 01/16/2018  Glucose 65 - 99 mg/dL 94 86 109(H)  BUN 8 - 27 mg/dL 30(H) 30(H) 26(H)  Creatinine 0.57 - 1.00 mg/dL 1.79(H) 1.95(H) 1.83(H)  Sodium 134 - 144 mmol/L 145(H) 144 141  Potassium 3.5 - 5.2 mmol/L 5.1 5.0 4.0  Chloride 96 - 106 mmol/L 103 104 109  CO2 20 - 29 mmol/L 24 23 23   Calcium 8.7 - 10.3 mg/dL 9.6 9.5 8.4(L)  Total Protein 6.0 - 8.5 g/dL - 6.4 -  Total Bilirubin 0.0 - 1.2 mg/dL - 0.3 -  Alkaline Phos 39 - 117 IU/L - 83 -  AST 0 - 40 IU/L - 18 -  ALT 0 - 32 IU/L - 8 -   CBC Latest Ref Rng & Units 06/20/2018 01/16/2018 01/15/2018  WBC 3.4 - 10.8 x10E3/uL 7.5 8.9 8.0  Hemoglobin 11.1 - 15.9 g/dL 11.0(L) 10.0(L) 10.5(L)  Hematocrit 34.0 - 46.6 % 35.1 32.8(L) 34.2(L)  Platelets 150 - 450 x10E3/uL 429 329 329   Lipid Panel     Component Value Date/Time   CHOL 143 04/23/2018 1118   TRIG 64 04/23/2018 1118   HDL 45 04/23/2018 1118   CHOLHDL 3.2 04/23/2018 1118   LDLCALC 85 04/23/2018 1118   HEMOGLOBIN A1C Lab Results  Component Value Date   HGBA1C 5.6 04/23/2018   TSH Recent Labs    06/20/18 1115  TSH 1.720    Cardiac studies:   Sleep Study [2011]: Negative for sleep apnea  Carotid Artery Duplex  01/01/2018: Stenosis in the right internal carotid artery (16-49%). Stenosis in the right common carotid artery (<50%). Stenosis in the right external carotid artery (<50%). Stenosis in the left internal carotid artery (16-49%). Stenosis in the left common carotid artery (<50%). Stenosis in the left external carotid artery of <50%. Bilateral diffuse heterogeneous plaque noted. Antegrade right vertebral artery flow. Antegrade left vertebral artery flow. Compared to the study done on 12/29/2016, no significant change noted. Consider f/u duplex in 1 yeaer if clinically indicated.  Coronary Angiogram  01/16/18: Patient Sypher  stent  in Cx, 2.75 x 15 mm DES placed on 01/22/08. Normal LVEF.  Echocardiogram 01/15/2018: Moderate LVH, LVEF 60-65%. Grade 2 diastolic dysfunction, elevated left atrial pressure. Trace aortic stenosis.  Calcified mitral annulus, mild mitral regurgitation. Mild left atrial dilatation. Mild RA/RV dilatation. PASP 40 mmHg.  Assessment:    1. Coronary artery disease of native artery of native heart with stable angina pectoris ([place])   2. Research study patient: Esperion (Bempidoic Acid) for statin intolerant patient   3. Essential hypertension   4. Pure hypercholesterolemia    EKG 02/19/2018: Normal sinus rhythm at 67 bpm, normal axis, poor R-wave progression cannot exclude anterior infarct old. No evidence of ischemia.  Recommendations:    Patient was seen by me 2 months ago for syncope following nausea and vomiting.  She is presently doing well and has not had any recurrence of syncope.  I am seeing her back for CAD, she has not had any recurrence of angina pectoris.  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  She also has a bulging disc and is seen her neurosurgeon in the next 2 days due to severe back pain.  Hence I did not make any changes to her medications.  With regard to lipids, she is endorgan clinical trial, ESPERION for statin intolerant patients.  I will see her back in 3 months for follow-up and I would like to see her in the clinic instead of virtual visit in view of her multiple cardiac risk factors.  Adrian Prows, MD, [facility] 08/20/2018, 1:22 PM [facility]. [place] Pager: [phone] Office: [phone] If no answer Cell [phone]

## 2018-08-20 NOTE — Patient Instructions (Addendum)
Blood pressure today is 162/84    Chronic Kidney Disease, Adult Chronic kidney disease (CKD) happens when the kidneys are damaged over a long period of time. The kidneys are two organs that help with:  Getting rid of waste and extra fluid from the blood.  Making hormones that maintain the amount of fluid in your tissues and blood vessels.  Making sure that the body has the right amount of fluids and chemicals. Most of the time, CKD does not go away, but it can usually be controlled. Steps must be taken to slow down the kidney damage or to stop it from getting worse. If this is not done, the kidneys may stop working. Follow these instructions at home: Medicines  Take over-the-counter and prescription medicines only as told by your doctor. You may need to change the amount of medicines you take.  Do not take any new medicines unless your doctor says it is okay. Many medicines can make your kidney damage worse.  Do not take any vitamin and supplements unless your doctor says it is okay. Many vitamins and supplements can make your kidney damage worse. General instructions  Follow a diet as told by your doctor. You may need to stay away from: ? Alcohol. ? Salty foods. ? Foods that are high in:  Potassium.  Calcium.  Protein.  Do not use any products that contain nicotine or tobacco, such as cigarettes and e-cigarettes. If you need help quitting, ask your doctor.  Keep track of your blood pressure at home. Tell your doctor about any changes.  If you have diabetes, keep track of your blood sugar as told by your doctor.  Try to stay at a healthy weight. If you need help, ask your doctor.  Exercise at least 30 minutes a day, 5 days a week.  Stay up-to-date with your shots (immunizations) as told by your doctor.  Keep all follow-up visits as told by your doctor. This is important. Contact a doctor if:  Your symptoms get worse.  You have new symptoms. Get help right away if:   You have symptoms of end-stage kidney disease. These may include: ? Headaches. ? Numbness in your hands or feet. ? Easy bruising. ? Having hiccups often. ? Chest pain. ? Shortness of breath. ? Stopping of menstrual periods in women.  You have a fever.  You have very little pee (urine).  You have pain or bleeding when you pee. Summary  Chronic kidney disease (CKD) happens when the kidneys are damaged over a long period of time.  Most of the time, this condition does not go away, but it can usually be controlled. Steps must be taken to slow down the kidney damage or to stop it from getting worse.  Treatment may include a combination of medicines and lifestyle changes. This information is not intended to replace advice given to you by your health care provider. Make sure you discuss any questions you have with your health care provider. Document Released: 04/27/2009 Document Revised: 01/13/2017 Document Reviewed: 03/07/2016 Elsevier Patient Education  2020 [employer].

## 2018-08-21 DIAGNOSIS — M5416 Radiculopathy, lumbar region: Secondary | ICD-10-CM | POA: Diagnosis not present

## 2018-08-26 NOTE — Progress Notes (Signed)
Subjective:     Patient ID: Kathryn [name] , female    DOB: 05-May-1940 , 78 y.o.   MRN: [id number]   Chief Complaint  Patient presents with  . Hypertension    HPI  Hypertension This is a chronic problem. The current episode started more than 1 year ago. The problem has been gradually improving since onset. The problem is controlled. Pertinent negatives include no blurred vision, chest pain, palpitations or shortness of breath. Risk factors for coronary artery disease include dyslipidemia, obesity, post-menopausal state and sedentary lifestyle. The current treatment provides moderate improvement. Compliance problems include exercise.  Hypertensive end-organ damage includes kidney disease and CAD/MI.     Past Medical History:  Diagnosis Date  . Anxiety    takes Xanax bid prn  . Bruises easily    pt is on Plavix  . Chronic back pain    buldging disc  . Chronic neck pain   . Coronary artery disease    1 stent   . Dysphagia   . Headache(784.0)    related to cervical issues  . History of colonic polyps   . Hyperlipidemia    takes Crestor daily  . Hypertension    takes Coreg and Diovan daily  . Osteoporosis    was taking shot 2xyr;but not taking anymore  . Peripheral vascular disease ([place])   . Seasonal allergies    takes Zyrtec nightly     Family History  Problem Relation Age of Onset  . Heart disease Mother        Heart Dissease before age 43  . Heart attack Mother   . Cancer Father   . Cancer Sister   . Leukemia Sister   . Cancer Brother   . Heart disease Brother        Amputation  . Heart attack Brother   . Heart disease Brother   . Heart attack Brother   . Dementia Other   . Anesthesia problems Neg Hx   . Hypotension Neg Hx   . Malignant hyperthermia Neg Hx   . Pseudochol deficiency Neg Hx      Current Outpatient Medications:  .  aspirin (ASPIRIN CHILDRENS) 81 MG chewable tablet, Chew 1 tablet (81 mg total) by mouth daily., Disp: 90 tablet, Rfl: 3 .   CALCIUM PO, Take 1 tablet by mouth daily. 600 mg, Disp: , Rfl:  .  carvedilol (COREG) 6.25 MG tablet, TAKE (1) TABLET BY MOUTH TWICE DAILY., Disp: 90 tablet, Rfl: 0 .  cetirizine (ZYRTEC) 10 MG tablet, Take 10 mg by mouth at bedtime., Disp: , Rfl:  .  ferrous gluconate (IRON 27) 240 (27 FE) MG tablet, Take 240 mg by mouth daily. , Disp: , Rfl:  .  furosemide (LASIX) 20 MG tablet, TAKE 1 TABLET BY MOUTH DAILY, Disp: 90 tablet, Rfl: 0 .  Investigational - Study Medication, Take 1 tablet by mouth daily. Study name:008 Additional study details:For cholesterol (Dr. Skip Estimable), Disp: , Rfl:  .  Multiple Vitamin (MULTIVITAMIN WITH MINERALS) TABS, Take 1 tablet by mouth daily., Disp: , Rfl:  .  nitroGLYCERIN (NITROSTAT) 0.4 MG SL tablet, Place 1 tablet (0.4 mg total) under the tongue every 5 (five) minutes x 3 doses as needed for chest pain., Disp: 30 tablet, Rfl: 3 .  pregabalin (LYRICA) 75 MG capsule, Take 1 capsule (75 mg total) by mouth 3 (three) times daily., Disp: 90 capsule, Rfl: 5   Allergies  Allergen Reactions  . Codeine Hypertension  . Shellfish Allergy  Review of Systems  Constitutional: Negative.   Eyes: Negative for blurred vision.  Respiratory: Negative.  Negative for shortness of breath.   Cardiovascular: Negative.  Negative for chest pain and palpitations.  Gastrointestinal: Negative.   Neurological: Negative.   Psychiatric/Behavioral: Negative.      Today's Vitals   08/20/18 1116  BP: (!) 162/84  Pulse: 69  Temp: 98.4 F (36.9 C)  TempSrc: Oral  Weight: 222 lb (100.7 kg)  Height: 5\' 2"  (1.575 m)  PainSc: 8   PainLoc: Leg   Body mass index is 40.6 kg/m.   Objective:  Physical Exam Vitals signs and nursing note reviewed.  Constitutional:      Appearance: Normal appearance. She is obese.  HENT:     Head: Normocephalic and atraumatic.  Cardiovascular:     Rate and Rhythm: Normal rate and regular rhythm.     Heart sounds: Normal heart sounds.  Pulmonary:      Effort: Pulmonary effort is normal.     Breath sounds: Normal breath sounds.  Musculoskeletal:     Lumbar back: She exhibits tenderness.     Comments: Neg straight leg test  Skin:    General: Skin is warm.  Neurological:     General: No focal deficit present.     Mental Status: She is alert.  Psychiatric:        Mood and Affect: Mood normal.        Behavior: Behavior normal.         Assessment And Plan:     1. Hypertensive heart and renal disease with renal failure, stage 1 through stage 4 or unspecified chronic kidney disease, without heart failure  Uncontrolled. She will continue with current meds for now. She does not wish to take any additional medication at this time. Importance of salt restriction was discussed with the patient. She is also encouraged to incorporate more exercise as tolerated into her daily routine.   2. Chronic kidney disease, stage III (moderate) (HCC)  Chronic. Most recent GFR, Cr reviewed.   3. Coronary artery disease of native artery of native heart with stable angina pectoris (HCC)  Chronic, yet stable. She is also followed by cardiology.   4. Chronic pain of right hip  Chronic. Has h/o OA. She is encouraged to follow an anti-inflammatory diet.   5. Sciatica of right side  Chronic, with acute flare. She is scheduled to see Ortho, Dr. Jacelyn Grip tomorrow.   6. Class 3 severe obesity due to excess calories with serious comorbidity and body mass index (BMI) of 40.0 to 44.9 in adult [facility])  Importance of achieving optimal weight to decrease risk of cardiovascular disease and cancers was discussed with the patient in full detail. She is encouraged to start slowly - start with 10 minutes twice daily at least three to four days per week and to gradually build to 30 minutes five days weekly. She was given tips to incorporate more activity into her daily routine - take stairs when possible, park farther away from grocery stores, etc.    7. Grief  reaction  She declines grief counseling at this time. She was advised that Hosp[facility] will offer this to her for free. She is still not interested at this time.   [name], MD    THE PATIENT IS ENCOURAGED TO PRACTICE SOCIAL DISTANCING DUE TO THE COVID-19 PANDEMIC.

## 2018-08-27 ENCOUNTER — Other Ambulatory Visit (HOSPITAL_COMMUNITY): Payer: Self-pay | Admitting: Internal Medicine

## 2018-08-27 DIAGNOSIS — Z1231 Encounter for screening mammogram for malignant neoplasm of breast: Secondary | ICD-10-CM

## 2018-08-30 ENCOUNTER — Telehealth: Payer: Self-pay

## 2018-09-04 ENCOUNTER — Other Ambulatory Visit: Payer: Self-pay | Admitting: Nurse Practitioner

## 2018-09-17 ENCOUNTER — Telehealth: Payer: Self-pay

## 2018-10-01 ENCOUNTER — Other Ambulatory Visit: Payer: Self-pay

## 2018-10-01 ENCOUNTER — Ambulatory Visit (HOSPITAL_COMMUNITY)
Admission: RE | Admit: 2018-10-01 | Discharge: 2018-10-01 | Disposition: A | Discharge status: Home / Self Care | Payer: Medicare Other | Source: Ambulatory Visit | Attending: Internal Medicine | Admitting: Internal Medicine

## 2018-10-01 DIAGNOSIS — Z1231 Encounter for screening mammogram for malignant neoplasm of breast: Secondary | ICD-10-CM

## 2018-10-16 ENCOUNTER — Telehealth: Payer: Self-pay

## 2018-11-13 ENCOUNTER — Encounter: Payer: Self-pay | Admitting: Internal Medicine

## 2018-11-15 ENCOUNTER — Ambulatory Visit (INDEPENDENT_AMBULATORY_CARE_PROVIDER_SITE_OTHER): Payer: Medicare Other

## 2018-11-15 ENCOUNTER — Other Ambulatory Visit: Payer: Self-pay

## 2018-11-15 ENCOUNTER — Encounter: Payer: Self-pay | Admitting: Internal Medicine

## 2018-11-15 ENCOUNTER — Ambulatory Visit (INDEPENDENT_AMBULATORY_CARE_PROVIDER_SITE_OTHER): Payer: Medicare Other | Admitting: Internal Medicine

## 2018-11-15 VITALS — BP 158/90 | HR 75 | Temp 97.8°F | Ht 62.2 in | Wt 222.4 lb

## 2018-11-15 DIAGNOSIS — I1310 Hypertensive heart and chronic kidney disease without heart failure, with stage 1 through stage 4 chronic kidney disease, or unspecified chronic kidney disease: Secondary | ICD-10-CM | POA: Diagnosis not present

## 2018-11-15 DIAGNOSIS — Z0000 Encounter for general adult medical examination without abnormal findings: Secondary | ICD-10-CM

## 2018-11-15 DIAGNOSIS — I25118 Atherosclerotic heart disease of native coronary artery with other forms of angina pectoris: Secondary | ICD-10-CM | POA: Diagnosis not present

## 2018-11-15 DIAGNOSIS — B0229 Other postherpetic nervous system involvement: Secondary | ICD-10-CM | POA: Diagnosis not present

## 2018-11-15 DIAGNOSIS — N183 Chronic kidney disease, stage 3 unspecified: Secondary | ICD-10-CM

## 2018-11-15 DIAGNOSIS — M5431 Sciatica, right side: Secondary | ICD-10-CM

## 2018-11-15 DIAGNOSIS — Z23 Encounter for immunization: Secondary | ICD-10-CM | POA: Diagnosis not present

## 2018-11-15 DIAGNOSIS — R7303 Prediabetes: Secondary | ICD-10-CM

## 2018-11-15 DIAGNOSIS — I10 Essential (primary) hypertension: Secondary | ICD-10-CM | POA: Diagnosis not present

## 2018-11-15 LAB — POCT UA - MICROALBUMIN
Creatinine, POC: 50 mg/dL
Microalbumin Ur, POC: 30 mg/L

## 2018-11-15 LAB — POCT URINALYSIS DIPSTICK
Bilirubin, UA: NEGATIVE
Glucose, UA: NEGATIVE
Ketones, UA: NEGATIVE
Nitrite, UA: NEGATIVE
Protein, UA: NEGATIVE
Spec Grav, UA: 1.01 (ref 1.010–1.025)
Urobilinogen, UA: 0.2 E.U./dL
pH, UA: 7 (ref 5.0–8.0)

## 2018-11-15 NOTE — Patient Instructions (Addendum)
Calm - magnesium supplement   Postherpetic Neuralgia Postherpetic neuralgia (PHN) is nerve pain that occurs after a shingles infection. Shingles is a painful rash that appears on one area of the body, usually on the trunk or face. Shingles is caused by the varicella-zoster virus. This is the same virus that causes chickenpox. In people who have had chickenpox, the virus can resurface years later and cause shingles. You may have PHN if you continue to have pain for 4 months after your shingles rash has gone away. PHN appears in the same area where you had the shingles rash. The pain usually goes away after the rash disappears. Getting a vaccination for shingles can prevent PHN. This vaccine is recommended for people older than 60. It may prevent shingles, and may also lower your risk of PHN if you do get shingles. What are the causes? This condition is caused by damage to your nerves from the varicella-zoster virus. The damage makes your nerves overly sensitive. What increases the risk? The following factors may make you more likely to develop this condition:  Being older than 79 years of age.  Having severe pain before your shingles rash starts.  Having a severe rash.  Having shingles in and around the eye area.  Having a disease that makes your body unable to fight infections (weak immune system). What are the signs or symptoms? The main symptom of this condition is pain. The pain may:  Often be very bad and may be described as stabbing, burning, or feeling like an electric shock.  Come and go or may be there all the time.  Be triggered by light touches on the skin or changes in temperature. You may have itching along with the pain. How is this diagnosed? This condition may be diagnosed based on your symptoms and your history of shingles. Lab studies and other diagnostic tests are usually not needed. How is this treated? There is no cure for this condition. Treatment for PHN will  focus on pain relief. Over-the-counter pain relievers do not usually relieve PHN pain. You may need to work with a pain specialist. Treatment may include:  Antidepressant medicines to help with pain and improve sleep.  Anti-seizure medicines to relieve nerve pain.  Strong pain relievers (opioids).  A numbing patch worn on the skin (lidocaine patch).  Botox (botulinum toxin) injections to block pain signals between nerves and muscles.  Injections of numbing medicine or anti-inflammatory medicines around irritated nerves. Follow these instructions at home:   It may take a long time to recover from PHN. Work closely with your health care provider and develop a good support system at home.  Take over-the-counter and prescription medicines only as told by your health care provider.  Do not drive or use heavy machinery while taking prescription pain medicine.  Wear loose, comfortable clothing.  Cover sensitive areas with a dressing to reduce friction from clothing rubbing on the area.  If directed, put ice on the painful area: ? Put ice in a plastic bag. ? Place a towel between your skin and the bag. ? Leave the ice on for 20 minutes, 2-3 times a day.  Talk to your health care provider if you feel depressed or desperate. Living with long-term pain can be depressing.  Keep all follow-up visits as told by your health care provider. This is important. Contact a health care provider if:  Your medicine is not helping.  You are struggling to manage your pain at home. Summary  Postherpetic  neuralgia is a very painful disorder that can occur after an episode of shingles.  The pain is often severe, burning, electric, or stabbing.  Prescription medicines can be helpful in managing persistent pain.  Getting a vaccination for shingles can prevent PHN. This vaccine is recommended for people older than 60. This information is not intended to replace advice given to you by your health care  provider. Make sure you discuss any questions you have with your health care provider. Document Released: 04/23/2002 Document Revised: 01/13/2017 Document Reviewed: 04/19/2016 Elsevier Patient Education  2020 [employer].

## 2018-11-15 NOTE — Patient Instructions (Signed)
Kathryn [name] , Thank you for taking time to come for your Medicare Wellness Visit. I appreciate your ongoing commitment to your health goals. Please review the following plan we discussed and let me know if I can assist you in the future.   Screening recommendations/referrals: Colonoscopy: not required Mammogram: 09/2018 Bone Density: 03/2016 Recommended yearly ophthalmology/optometry visit for glaucoma screening and checkup Recommended yearly dental visit for hygiene and checkup  Vaccinations: Influenza vaccine: today Pneumococcal vaccine: 07/2013 Tdap vaccine: 10/2012 Shingles vaccine: discussed    Advanced directives: Please bring a copy of your POA (Power of [place]) and/or Living Will to your next appointment.    Conditions/risks identified: obesity  Next appointment: 01/22/2019 at 2:45   Preventive Care 79 Years and Older, Female Preventive care refers to lifestyle choices and visits with your health care provider that can promote health and wellness. What does preventive care include?  A yearly physical exam. This is also called an annual well check.  Dental exams once or twice a year.  Routine eye exams. Ask your health care provider how often you should have your eyes checked.  Personal lifestyle choices, including:  Daily care of your teeth and gums.  Regular physical activity.  Eating a healthy diet.  Avoiding tobacco and drug use.  Limiting alcohol use.  Practicing safe sex.  Taking low-dose aspirin every day.  Taking vitamin and mineral supplements as recommended by your health care provider. What happens during an annual well check? The services and screenings done by your health care provider during your annual well check will depend on your age, overall health, lifestyle risk factors, and family history of disease. Counseling  Your health care provider may ask you questions about your:  Alcohol use.  Tobacco use.  Drug use.  Emotional  well-being.  Home and relationship well-being.  Sexual activity.  Eating habits.  History of falls.  Memory and ability to understand (cognition).  Work and work Statistician.  Reproductive health. Screening  You may have the following tests or measurements:  Height, weight, and BMI.  Blood pressure.  Lipid and cholesterol levels. These may be checked every 5 years, or more frequently if you are over 97 years old.  Skin check.  Lung cancer screening. You may have this screening every year starting at age 73 if you have a 30-pack-year history of smoking and currently smoke or have quit within the past 15 years.  Fecal occult blood test (FOBT) of the stool. You may have this test every year starting at age 3.  Flexible sigmoidoscopy or colonoscopy. You may have a sigmoidoscopy every 5 years or a colonoscopy every 10 years starting at age 10.  Hepatitis C blood test.  Hepatitis B blood test.  Sexually transmitted disease (STD) testing.  Diabetes screening. This is done by checking your blood sugar (glucose) after you have not eaten for a while (fasting). You may have this done every 1-3 years.  Bone density scan. This is done to screen for osteoporosis. You may have this done starting at age 38.  Mammogram. This may be done every 1-2 years. Talk to your health care provider about how often you should have regular mammograms. Talk with your health care provider about your test results, treatment options, and if necessary, the need for more tests. Vaccines  Your health care provider may recommend certain vaccines, such as:  Influenza vaccine. This is recommended every year.  Tetanus, diphtheria, and acellular pertussis (Tdap, Td) vaccine. You may need a Td  booster every 10 years.  Zoster vaccine. You may need this after age 53.  Pneumococcal 13-valent conjugate (PCV13) vaccine. One dose is recommended after age 27.  Pneumococcal polysaccharide (PPSV23) vaccine. One  dose is recommended after age 21. Talk to your health care provider about which screenings and vaccines you need and how often you need them. This information is not intended to replace advice given to you by your health care provider. Make sure you discuss any questions you have with your health care provider. Document Released: 02/27/2015 Document Revised: 10/21/2015 Document Reviewed: 12/02/2014 Elsevier Interactive Patient Education  2017 [place] Prevention in the Home Falls can cause injuries. They can happen to people of all ages. There are many things you can do to make your home safe and to help prevent falls. What can I do on the outside of my home?  Regularly fix the edges of walkways and driveways and fix any cracks.  Remove anything that might make you trip as you walk through a door, such as a raised step or threshold.  Trim any bushes or trees on the path to your home.  Use bright outdoor lighting.  Clear any walking paths of anything that might make someone trip, such as rocks or tools.  Regularly check to see if handrails are loose or broken. Make sure that both sides of any steps have handrails.  Any raised decks and porches should have guardrails on the edges.  Have any leaves, snow, or ice cleared regularly.  Use sand or salt on walking paths during winter.  Clean up any spills in your garage right away. This includes oil or grease spills. What can I do in the bathroom?  Use night lights.  Install grab bars by the toilet and in the tub and shower. Do not use towel bars as grab bars.  Use non-skid mats or decals in the tub or shower.  If you need to sit down in the shower, use a plastic, non-slip stool.  Keep the floor dry. Clean up any water that spills on the floor as soon as it happens.  Remove soap buildup in the tub or shower regularly.  Attach bath mats securely with double-sided non-slip rug tape.  Do not have throw rugs and other  things on the floor that can make you trip. What can I do in the bedroom?  Use night lights.  Make sure that you have a light by your bed that is easy to reach.  Do not use any sheets or blankets that are too big for your bed. They should not hang down onto the floor.  Have a firm chair that has side arms. You can use this for support while you get dressed.  Do not have throw rugs and other things on the floor that can make you trip. What can I do in the kitchen?  Clean up any spills right away.  Avoid walking on wet floors.  Keep items that you use a lot in easy-to-reach places.  If you need to reach something above you, use a strong step stool that has a grab bar.  Keep electrical cords out of the way.  Do not use floor polish or wax that makes floors slippery. If you must use wax, use non-skid floor wax.  Do not have throw rugs and other things on the floor that can make you trip. What can I do with my stairs?  Do not leave any items on the stairs.  Make sure that there are handrails on both sides of the stairs and use them. Fix handrails that are broken or loose. Make sure that handrails are as long as the stairways.  Check any carpeting to make sure that it is firmly attached to the stairs. Fix any carpet that is loose or worn.  Avoid having throw rugs at the top or bottom of the stairs. If you do have throw rugs, attach them to the floor with carpet tape.  Make sure that you have a light switch at the top of the stairs and the bottom of the stairs. If you do not have them, ask someone to add them for you. What else can I do to help prevent falls?  Wear shoes that:  Do not have high heels.  Have rubber bottoms.  Are comfortable and fit you well.  Are closed at the toe. Do not wear sandals.  If you use a stepladder:  Make sure that it is fully opened. Do not climb a closed stepladder.  Make sure that both sides of the stepladder are locked into place.  Ask  someone to hold it for you, if possible.  Clearly mark and make sure that you can see:  Any grab bars or handrails.  First and last steps.  Where the edge of each step is.  Use tools that help you move around (mobility aids) if they are needed. These include:  Canes.  Walkers.  Scooters.  Crutches.  Turn on the lights when you go into a dark area. Replace any light bulbs as soon as they burn out.  Set up your furniture so you have a clear path. Avoid moving your furniture around.  If any of your floors are uneven, fix them.  If there are any pets around you, be aware of where they are.  Review your medicines with your doctor. Some medicines can make you feel dizzy. This can increase your chance of falling. Ask your doctor what other things that you can do to help prevent falls. This information is not intended to replace advice given to you by your health care provider. Make sure you discuss any questions you have with your health care provider. Document Released: 11/27/2008 Document Revised: 07/09/2015 Document Reviewed: 03/07/2014 Elsevier Interactive Patient Education  2017 [employer].

## 2018-11-15 NOTE — Progress Notes (Signed)
Subjective:   BURKLEE BI is a 78 y.o. female who presents for Medicare Annual (Subsequent) preventive examination.  Review of Systems:  n/a Cardiac Risk Factors include: advanced age (>2men, >71 women);hypertension;obesity (BMI >30kg/m2);dyslipidemia     Objective:     Vitals: BP (!) 158/90 (BP Location: Left Arm, Patient Position: Sitting, Cuff Size: Normal)   Pulse 75   Temp 97.8 F (36.6 C) (Oral)   Ht 5' 2.2" (1.58 m)   Wt 222 lb 6.4 oz (100.9 kg)   SpO2 99%   BMI 40.42 kg/m   Body mass index is 40.42 kg/m.  Advanced Directives 11/15/2018 05/24/2018 01/14/2018 05/11/2016 03/17/2011 03/08/2011  Does Patient Have a Medical Advance Directive? Yes No No No Patient does not have advance directive;Patient would not like information Patient does not have advance directive;Patient would not like information  Type of Advance Directive Living will - - - - -  Would patient like information on creating a medical advance directive? - No - Patient declined No - Patient declined - - -  Pre-existing out of facility DNR order (yellow form or pink MOST form) - - - - No No    Tobacco Social History   Tobacco Use  Smoking Status Former Smoker  . Packs/day: 0.25  . Years: 5.00  . Pack years: 1.25  Smokeless Tobacco Never Used     Counseling given: Not Answered   Clinical Intake:  Pre-visit preparation completed: Yes  Pain : 0-10 Pain Score: 8  Pain Type: Chronic pain Pain Location: Back Pain Orientation: Lower, Right Pain Radiating Towards: right leg Pain Descriptors / Indicators: Burning Pain Onset: More than a month ago Pain Frequency: Constant Pain Relieving Factors: nothing  Pain Relieving Factors: nothing  Nutritional Status: BMI > 30  Obese Nutritional Risks: None Diabetes: No  How often do you need to have someone help you when you read instructions, pamphlets, or other written materials from your doctor or pharmacy?: 1 - Never What is the last grade level  you completed in school?: 2 years college  Interpreter Needed?: No  Information entered by :: NAllen LPN  Past Medical History:  Diagnosis Date  . Anxiety    takes Xanax bid prn  . Bruises easily    pt is on Plavix  . Chronic back pain    buldging disc  . Chronic neck pain   . Coronary artery disease    1 stent   . Dysphagia   . Headache(784.0)    related to cervical issues  . History of colonic polyps   . Hyperlipidemia    takes Crestor daily  . Hypertension    takes Coreg and Diovan daily  . Osteoporosis    was taking shot 2xyr;but not taking anymore  . Peripheral vascular disease ([place])   . Seasonal allergies    takes Zyrtec nightly   Past Surgical History:  Procedure Laterality Date  . ABDOMINAL HYSTERECTOMY  70's  . BUNIONECTOMY     left foot  . CARDIAC CATHETERIZATION  2009/2011   1 stent placed  . CHOLECYSTECTOMY  2009  . COLONOSCOPY    . ESOPHAGOGASTRODUODENOSCOPY    . LEFT HEART CATH AND CORONARY ANGIOGRAPHY N/A 01/16/2018   Procedure: LEFT HEART CATH AND CORONARY ANGIOGRAPHY;  Surgeon: Nigel Mormon, MD;  Location: [facility];  Service: Cardiovascular;  Laterality: N/A;  . TONSILLECTOMY     at age 60   Family History  Problem Relation Age of Onset  . Heart  disease Mother        Heart Dissease before age 54  . Heart attack Mother   . Cancer Father   . Cancer Sister   . Leukemia Sister   . Cancer Brother   . Heart disease Brother        Amputation  . Heart attack Brother   . Heart disease Brother   . Heart attack Brother   . Dementia Other   . Anesthesia problems Neg Hx   . Hypotension Neg Hx   . Malignant hyperthermia Neg Hx   . Pseudochol deficiency Neg Hx    Social History   Socioeconomic History  . Marital status: Married    Spouse name: Not on file  . Number of children: 4  . Years of education: Not on file  . Highest education level: Not on file  Occupational History  . Occupation: retired  Scientific laboratory technician  .  Financial resource strain: Not hard at all  . Food insecurity    Worry: Never true    Inability: Never true  . Transportation needs    Medical: No    Non-medical: No  Tobacco Use  . Smoking status: Former Smoker    Packs/day: 0.25    Years: 5.00    Pack years: 1.25  . Smokeless tobacco: Never Used  Substance and Sexual Activity  . Alcohol use: No  . Drug use: No  . Sexual activity: Not Currently  Lifestyle  . Physical activity    Days per week: 3 days    Minutes per session: 40 min  . Stress: Not at all  Relationships  . Social connections    Talks on phone: More than three times a week    Gets together: More than three times a week    Attends religious service: More than 4 times per year    Active member of club or organization: Yes    Attends meetings of clubs or organizations: More than 4 times per year    Relationship status: Married  Other Topics Concern  . Not on file  Social History Narrative  . Not on file    Outpatient Encounter Medications as of 11/15/2018  Medication Sig  . aspirin (ASPIRIN CHILDRENS) 81 MG chewable tablet Chew 1 tablet (81 mg total) by mouth daily.  [name] CALCIUM PO Take 1 tablet by mouth daily. 600 mg  . carvedilol (COREG) 6.25 MG tablet TAKE (1) TABLET BY MOUTH TWICE DAILY.  . cetirizine (ZYRTEC) 10 MG tablet Take 10 mg by mouth at bedtime.  . ferrous gluconate (IRON 27) 240 (27 FE) MG tablet Take 240 mg by mouth daily.   . furosemide (LASIX) 20 MG tablet TAKE 1 TABLET BY MOUTH DAILY  . Investigational - Study Medication Take 1 tablet by mouth daily. Study name:008 Additional study details:For cholesterol (Dr. Skip Estimable)  . Multiple Vitamin (MULTIVITAMIN WITH MINERALS) TABS Take 1 tablet by mouth daily.  . nitroGLYCERIN (NITROSTAT) 0.4 MG SL tablet Place 1 tablet (0.4 mg total) under the tongue every 5 (five) minutes x 3 doses as needed for chest pain.  . pregabalin (LYRICA) 75 MG capsule Take 1 capsule (75 mg total) by mouth 3 (three) times  daily.   No facility-administered encounter medications on file as of 11/15/2018.     Activities of Daily Living In your present state of health, do you have any difficulty performing the following activities: 11/15/2018  Hearing? N  Vision? N  Difficulty concentrating or making decisions? N  Walking  or climbing stairs? Y  Comment due to back and leg pain  Dressing or bathing? N  Doing errands, shopping? N  Preparing Food and eating ? N  Using the Toilet? N  In the past six months, have you accidently leaked urine? N  Do you have problems with loss of bowel control? N  Managing your Medications? N  Managing your Finances? N  Housekeeping or managing your Housekeeping? N  Some recent data might be hidden    Patient Care Team: Glendale Chard, MD as PCP - General (Internal Medicine) Adrian Prows, MD as Attending Physician (Cardiology) Daneen Schick as Social Worker [place], Claudette Stapler, RN as Case Manager    Assessment:   This is a routine wellness examination for Anysa.  Exercise Activities and Dietary recommendations Current Exercise Habits: Structured exercise class, Time (Minutes): 45, Frequency (Times/Week): 3, Weekly Exercise (Minutes/Week): 135  Goals    . 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 Over the next 60 days, patient will work with RN CM to address needs related to referral for outpatient PT  Interventions:   CCM Telephone initial follow up with patient to establish goals . Evaluation of current treatment plan related to pain management for sciatica and patient's adherence to plan as established by provider .  Provided education to patient re: the benefits of stretching, walking and performing light exercise to help keep joints lubricated and muscles strong . Provided education to patient re: the benefits from receiving PT to help develop a home exercise plan and assist with balancing and strengthening (plan to start after COVID-19) . Reviewed medications with patient and discussed Dr. Baird Cancer approval to increase Lyrica to 75 mg tid . Collaborated with [place] regarding receipt of new Rx for increased Lyrica (spoke with Arnette Norris who confirmed this has been received), pt advised  . Discussed plans with patient for ongoing care management follow up and provided patient with direct contact information for care management team . Reviewed scheduled/upcoming provider appointments including: f/u with Orthopedic for steroid injection to lumbar spine, and PCP f/u with Dr. Baird Cancer scheduled for 08/20/18 . Provided patient with RNCM contact # and availability . Scheduled a CCM follow up call with patient for about 2 weeks   Patient Self Care Activities:  Verbalizes understanding of the education/information provided today . Self administers medications as prescribed . Attends all scheduled provider appointments . Calls pharmacy for medication refills . Attends church or other social activities . Performs ADL's independently . Performs IADL's independently . Calls provider office for new concerns or questions  Initial goal documentation      . "I don't know if I have an OTC benefit" (pt-stated)     Current Barriers:  [name] Knowledge Deficits related to Health Plan Benefit options, specifically for OTC benefits  Nurse Case Manager Clinical Goal(s):  Over the next 30 days, patient will verbalize basic understanding of her [employer] OTC benefit option.  Interventions:   CCM Telephone initial follow up with patient to establish goals . Advised patient to call member services, phone #  located on the back of her insurance card to inquire about  her OTC benefits  Collaborated with [place] regarding [employer] OTC benefit options . Discussed plans with patient for ongoing care management follow up and provided patient with direct contact information for care management team . Provided RNCM contact # and hours of availability  . Scheduled a follow up visit with patient for 2-3 weeks  Patient Self Care Activities:   Verbalizes understanding of the education/information provided today . Self administers medications as prescribed . Attends all scheduled provider appointments . Calls pharmacy for medication refills . Attends church or other social activities . Performs ADL's independently . Performs IADL's independently . Calls provider office for new concerns or questions  Initial goal documentation      . "I have some anxiety's" (pt-stated)     Current Barriers:  [name] Knowledge Deficits related to Self Health management for Anxiety   Nurse Case Manager Clinical Goal(s):  [name] Over the next 30 days, patient will verbalize understanding of plan for treatment of Anxiety.   Interventions:   CCM Telephone initial follow up with patient to establish goals . Evaluation of current treatment plan related to treatment for Anxiety and patient's adherence to plan as established by provider . Provided education to patient re: relaxation techniques and pursed lip deep breathing exercises in order slow breathing during times of anxiety . Reviewed medications with patient and discussed when to call the doctor for ineffective coping and or worsening or persistent depression . Discussed plans with patient for ongoing care management follow up and provided patient with direct contact information for care management team . Provided RNCM contact # and hours of availability  . Scheduled a follow up visit with patient for about 2 weeks  Patient Self Care Activities:   Verbalizes  understanding of the education/information provided today . Self administers medications as prescribed . Attends all scheduled provider appointments . Calls pharmacy for medication refills . Attends church or other social activities . Performs ADL's independently . Performs IADL's independently . Calls provider office for new concerns or questions  Initial goal documentation      . Weight (lb) < 200 lb (90.7 kg)     11/15/2018, wants to get below 200 pounds       Fall Risk Fall Risk  11/15/2018 08/20/2018 06/19/2018 04/23/2018 01/18/2018  Falls in the past year? 0 0 1 1 1   Number falls in past yr: 0 - 1 1 0  Injury with Fall? - - 0 0 1  Risk for fall due to : Medication side effect - - - -  Follow up Falls evaluation completed;Education provided;Falls prevention discussed - - - -   Is the patient's home free of loose throw rugs in walkways, pet beds, electrical cords, etc?   yes      Grab bars in the bathroom? yes      Handrails on the stairs?   yes      Adequate lighting?   yes  Timed Get Up and Go performed: n/a  Depression Screen PHQ 2/9 Scores 11/15/2018 08/20/2018 05/24/2018 04/23/2018  PHQ - 2 Score 0 0 1 0  PHQ- 9 Score 2 - - -     Cognitive Function     6CIT Screen 11/15/2018  What Year? 0 points  What month? 0 points  What time? 0 points  Count back from 20 0 points  Months in reverse 0 points  Repeat phrase 4 points  Total Score 4    Immunization History  Administered Date(s) Administered  .  DTaP 10/25/2012  . Influenza, High Dose Seasonal PF 12/21/2017, 11/15/2018  . Pneumococcal-Unspecified 03/12/2012, 07/15/2013  . Zoster Recombinat (Shingrix) 11/01/2018    Qualifies for Shingles Vaccine? yes  Screening Tests Health Maintenance  Topic Date Due  . PNA vac Low Risk Adult (2 of 2 - PCV13) 07/16/2014  . TETANUS/TDAP  10/26/2022  . INFLUENZA VACCINE  Completed  . DEXA SCAN  Completed    Cancer Screenings: Lung: Low Dose CT Chest recommended if Age 24-80  years, 30 pack-year currently smoking OR have quit w/in 15years. Patient does not qualify. Breast:  Up to date on Mammogram? Yes   Up to date of Bone Density/Dexa? Yes Colorectal: due  Additional Screenings: : Hepatitis C Screening: n/a     Plan:    Patient wants to get below 200 pounds.   I have personally reviewed and noted the following in the patient's chart:   . Medical and social history . Use of alcohol, tobacco or illicit drugs  . Current medications and supplements . Functional ability and status . Nutritional status . Physical activity . Advanced directives . List of other physicians . Hospitalizations, surgeries, and ER visits in previous 12 months . Vitals . Screenings to include cognitive, depression, and falls . Referrals and appointments  In addition, I have reviewed and discussed with patient certain preventive protocols, quality metrics, and best practice recommendations. A written personalized care plan for preventive services as well as general preventive health recommendations were provided to patient.     Kellie Simmering, LPN  [id number]

## 2018-11-16 LAB — BMP8+EGFR
BUN/Creatinine Ratio: 16 (ref 12–28)
BUN: 22 mg/dL (ref 8–27)
CO2: 25 mmol/L (ref 20–29)
Calcium: 9.7 mg/dL (ref 8.7–10.3)
Chloride: 104 mmol/L (ref 96–106)
Creatinine, Ser: 1.36 mg/dL — ABNORMAL HIGH (ref 0.57–1.00)
GFR calc Af Amer: 43 mL/min/{1.73_m2} — ABNORMAL LOW (ref >59)
GFR calc non Af Amer: 37 mL/min/{1.73_m2} — ABNORMAL LOW (ref >59)
Glucose: 83 mg/dL (ref 65–99)
Potassium: 4.4 mmol/L (ref 3.5–5.2)
Sodium: 142 mmol/L (ref 134–144)

## 2018-11-18 NOTE — Progress Notes (Signed)
Subjective:     Patient ID: Kathryn [name] , female    DOB: May 23, 1940 , 78 y.o.   MRN: [id number]   Chief Complaint  Patient presents with  . Diabetes  . Hypertension    HPI  She is here today for f/u prediabetes and high blood pressure. She reports compliance with meds.   Hypertension This is a chronic problem. The current episode started more than 1 year ago. The problem has been gradually improving since onset. The problem is controlled. Pertinent negatives include no blurred vision, chest pain, palpitations or shortness of breath. Risk factors for coronary artery disease include dyslipidemia, obesity, post-menopausal state and sedentary lifestyle. The current treatment provides moderate improvement. Compliance problems include exercise.  Hypertensive end-organ damage includes kidney disease.     Past Medical History:  Diagnosis Date  . Anxiety    takes Xanax bid prn  . Bruises easily    pt is on Plavix  . Chronic back pain    buldging disc  . Chronic neck pain   . Coronary artery disease    1 stent   . Dysphagia   . Headache(784.0)    related to cervical issues  . History of colonic polyps   . Hyperlipidemia    takes Crestor daily  . Hypertension    takes Coreg and Diovan daily  . Osteoporosis    was taking shot 2xyr;but not taking anymore  . Peripheral vascular disease ([place])   . Seasonal allergies    takes Zyrtec nightly     Family History  Problem Relation Age of Onset  . Heart disease Mother        Heart Dissease before age 54  . Heart attack Mother   . Cancer Father   . Cancer Sister   . Leukemia Sister   . Cancer Brother   . Heart disease Brother        Amputation  . Heart attack Brother   . Heart disease Brother   . Heart attack Brother   . Dementia Other   . Anesthesia problems Neg Hx   . Hypotension Neg Hx   . Malignant hyperthermia Neg Hx   . Pseudochol deficiency Neg Hx      Current Outpatient Medications:  .  aspirin (ASPIRIN  CHILDRENS) 81 MG chewable tablet, Chew 1 tablet (81 mg total) by mouth daily., Disp: 90 tablet, Rfl: 3 .  CALCIUM PO, Take 1 tablet by mouth daily. 600 mg, Disp: , Rfl:  .  carvedilol (COREG) 6.25 MG tablet, TAKE (1) TABLET BY MOUTH TWICE DAILY., Disp: 90 tablet, Rfl: 1 .  cetirizine (ZYRTEC) 10 MG tablet, Take 10 mg by mouth at bedtime., Disp: , Rfl:  .  ferrous gluconate (IRON 27) 240 (27 FE) MG tablet, Take 240 mg by mouth daily. , Disp: , Rfl:  .  furosemide (LASIX) 20 MG tablet, TAKE 1 TABLET BY MOUTH DAILY, Disp: 90 tablet, Rfl: 0 .  Investigational - Study Medication, Take 1 tablet by mouth daily. Study name:008 Additional study details:For cholesterol (Dr. Skip Estimable), Disp: , Rfl:  .  Multiple Vitamin (MULTIVITAMIN WITH MINERALS) TABS, Take 1 tablet by mouth daily., Disp: , Rfl:  .  nitroGLYCERIN (NITROSTAT) 0.4 MG SL tablet, Place 1 tablet (0.4 mg total) under the tongue every 5 (five) minutes x 3 doses as needed for chest pain., Disp: 30 tablet, Rfl: 3 .  pregabalin (LYRICA) 75 MG capsule, Take 1 capsule (75 mg total) by mouth 3 (three) times daily., Disp:  90 capsule, Rfl: 5   Allergies  Allergen Reactions  . Codeine Hypertension  . Shellfish Allergy      Review of Systems  Constitutional: Negative.   Eyes: Negative for blurred vision.  Respiratory: Negative.  Negative for shortness of breath.   Cardiovascular: Negative.  Negative for chest pain and palpitations.  Gastrointestinal: Negative.   Musculoskeletal: Positive for back pain.       She c/o worsening back pain. She has been managed by Dr. Mina Marble at [facility] in the past. She has yet to contact him for evaluation. Denies LE weakness/paresthesias. Denies trauma/fall.  Neurological: Negative.   Psychiatric/Behavioral: Negative.      Today's Vitals   11/15/18 1155  BP: (!) 158/90  Pulse: 75  Temp: 97.8 F (36.6 C)  TempSrc: Oral  SpO2: 99%  Weight: 222 lb 6.4 oz (100.9 kg)  Height: 5' 2.2" (1.58 m)   Body mass  index is 40.42 kg/m.   Objective:  Physical Exam Vitals signs and nursing note reviewed.  Constitutional:      General: She is in acute distress.     Appearance: Normal appearance. She is obese.  HENT:     Head: Normocephalic and atraumatic.  Cardiovascular:     Rate and Rhythm: Normal rate and regular rhythm.     Heart sounds: Normal heart sounds.  Pulmonary:     Effort: Pulmonary effort is normal.     Breath sounds: Normal breath sounds.  Musculoskeletal:     Lumbar back: She exhibits tenderness and spasm.  Skin:    General: Skin is warm.  Neurological:     General: No focal deficit present.     Mental Status: She is alert.  Psychiatric:        Mood and Affect: Mood normal.        Behavior: Behavior normal.         Assessment And Plan:     1. Prediabetes  Previous a1c results were reviewed. She is encouraged to incorporate more exercise into her daily routine, as tolerated.   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  Her sx are likely exacerbated by her back pain. She will continue with current meds for now. She Is encouraged to avoiid adding salt to her foods.   - BMP8+EGFR  3. Coronary artery disease of native artery of native heart with stable angina pectoris (HCC)  Chronic, yet stable. Importance of dietary, medication and exercise compliance was discussed with the patient.   4. Stage 3 chronic kidney disease, unspecified whether stage 3a or 3b CKD  Chronic. Importance of bp/bs control and adequate hydration was discussed with the patient.   5. Postherpetic neuralgia  Chronic. She will continue with pregabalin 56m tid.   6. Sciatica of right side  Chronic. I will refer her to Ortho as requested. She is advised to try tramadol nightly (she has previous rx). She will let me know if this is effective.   RMaximino Greenland MD    THE  PATIENT IS ENCOURAGED TO PRACTICE SOCIAL DISTANCING DUE TO THE COVID-19 PANDEMIC.

## 2018-11-28 ENCOUNTER — Encounter: Payer: Self-pay | Admitting: Cardiology

## 2018-11-28 ENCOUNTER — Ambulatory Visit (INDEPENDENT_AMBULATORY_CARE_PROVIDER_SITE_OTHER): Payer: Medicare Other | Admitting: Cardiology

## 2018-11-28 ENCOUNTER — Other Ambulatory Visit: Payer: Self-pay

## 2018-11-28 ENCOUNTER — Telehealth: Payer: Self-pay

## 2018-11-28 VITALS — BP 176/70 | HR 74 | Temp 97.8°F | Ht 65.0 in | Wt 221.8 lb

## 2018-11-28 DIAGNOSIS — I350 Nonrheumatic aortic (valve) stenosis: Secondary | ICD-10-CM | POA: Diagnosis not present

## 2018-11-28 DIAGNOSIS — R0600 Dyspnea, unspecified: Secondary | ICD-10-CM

## 2018-11-28 DIAGNOSIS — Z006 Encounter for examination for normal comparison and control in clinical research program: Secondary | ICD-10-CM

## 2018-11-28 DIAGNOSIS — I10 Essential (primary) hypertension: Secondary | ICD-10-CM | POA: Diagnosis not present

## 2018-11-28 DIAGNOSIS — I25118 Atherosclerotic heart disease of native coronary artery with other forms of angina pectoris: Secondary | ICD-10-CM

## 2018-11-28 DIAGNOSIS — R0609 Other forms of dyspnea: Secondary | ICD-10-CM

## 2018-11-28 MED ORDER — AMLODIPINE BESYLATE 5 MG PO TABS: 5.0000 mg | ORAL_TABLET | Freq: Every day | ORAL | 2 refills | Status: DC

## 2018-11-28 NOTE — Progress Notes (Signed)
Primary Physician/Referring:  Glendale Chard, MD  Patient ID: Kathryn [name], female    DOB: 08/09/1940, 78 y.o.   MRN: [id number]  Chief Complaint  Patient presents with  . Hypertension  . Coronary Artery Disease  . Follow-up    35mo    HPI: Kathryn [name]  is a 78 y.o. female  with CAD s/p PCI to LCx 2009, hypertension, statin intolerant hyperlipidemia, CKD stage 3 and mild bilateral asymptomatic carotid stenosis.  She was seen by 36-month ago on a virtual visit, due to an episode of syncope that occurred in May 2020.  Symptoms felt to be due to vasovagal episodes.  She now presents here for follow-up of coronary artery disease and hypertension.  Coronary Angiography on 01/16/18 that showed no obstructive CAD, medical management. Patient is not on statin due to intolerance, she is enrolled in esperion trial.   Denies any chest pain or worsening dyspnea or leg edema.  She has noticed her blood pressure to be elevated last few days to couple weeks.  No fever, no chills.    Past Medical History:  Diagnosis Date  . Anxiety    takes Xanax bid prn  . Bruises easily    pt is on Plavix  . Chronic back pain    buldging disc  . Chronic neck pain   . Coronary artery disease    1 stent   . Dysphagia   . Headache(784.0)    related to cervical issues  . History of colonic polyps   . Hyperlipidemia    takes Crestor daily  . Hypertension    takes Coreg and Diovan daily  . Osteoporosis    was taking shot 2xyr;but not taking anymore  . Peripheral vascular disease ([place])   . Seasonal allergies    takes Zyrtec nightly    Past Surgical History:  Procedure Laterality Date  . ABDOMINAL HYSTERECTOMY  70's  . BUNIONECTOMY     left foot  . CARDIAC CATHETERIZATION  2009/2011   1 stent placed  . CHOLECYSTECTOMY  2009  . COLONOSCOPY    . ESOPHAGOGASTRODUODENOSCOPY    . LEFT HEART CATH AND CORONARY ANGIOGRAPHY N/A 01/16/2018   Procedure: LEFT HEART CATH AND CORONARY ANGIOGRAPHY;   Surgeon: Nigel Mormon, MD;  Location: [facility];  Service: Cardiovascular;  Laterality: N/A;  . TONSILLECTOMY     at age 26    Social History   Socioeconomic History  . Marital status: Married    Spouse name: Not on file  . Number of children: 4  . Years of education: Not on file  . Highest education level: Not on file  Occupational History  . Occupation: retired  Scientific laboratory technician  . Financial resource strain: Not hard at all  . Food insecurity    Worry: Never true    Inability: Never true  . Transportation needs    Medical: No    Non-medical: No  Tobacco Use  . Smoking status: Former Smoker    Packs/day: 0.25    Years: 5.00    Pack years: 1.25    Quit date: 11/27/1968    Years since quitting: 50.0  . Smokeless tobacco: Never Used  Substance and Sexual Activity  . Alcohol use: No  . Drug use: No  . Sexual activity: Not Currently  Lifestyle  . Physical activity    Days per week: 3 days    Minutes per session: 40 min  . Stress: Not at all  Relationships  .  Social connections    Talks on phone: More than three times a week    Gets together: More than three times a week    Attends religious service: More than 4 times per year    Active member of club or organization: Yes    Attends meetings of clubs or organizations: More than 4 times per year    Relationship status: Married  . Intimate partner violence    Fear of current or ex partner: No    Emotionally abused: No    Physically abused: No    Forced sexual activity: No  Other Topics Concern  . Not on file  Social History Narrative  . Not on file    Current Outpatient Medications on File Prior to Visit  Medication Sig Dispense Refill  . aspirin (ASPIRIN CHILDRENS) 81 MG chewable tablet Chew 1 tablet (81 mg total) by mouth daily. 90 tablet 3  . CALCIUM PO Take 1 tablet by mouth daily. 600 mg    . carvedilol (COREG) 6.25 MG tablet TAKE (1) TABLET BY MOUTH TWICE DAILY. 90 tablet 1  . cetirizine  (ZYRTEC) 10 MG tablet Take 10 mg by mouth at bedtime.    . ferrous gluconate (IRON 27) 240 (27 FE) MG tablet Take 240 mg by mouth daily.     . furosemide (LASIX) 20 MG tablet TAKE 1 TABLET BY MOUTH DAILY 90 tablet 0  . Investigational - Study Medication Take 1 tablet by mouth daily. Study name:008 Additional study details:For cholesterol (Dr. Skip Estimable)    . Multiple Vitamin (MULTIVITAMIN WITH MINERALS) TABS Take 1 tablet by mouth daily.    . nitroGLYCERIN (NITROSTAT) 0.4 MG SL tablet Place 1 tablet (0.4 mg total) under the tongue every 5 (five) minutes x 3 doses as needed for chest pain. 30 tablet 3  . pregabalin (LYRICA) 75 MG capsule Take 1 capsule (75 mg total) by mouth 3 (three) times daily. 90 capsule 5   No current facility-administered medications on file prior to visit.    Review of Systems  Constitution: Negative for chills, decreased appetite, malaise/fatigue and weight gain.  Cardiovascular: Positive for chest pain (left sided post herpetic and chronic). Negative for leg swelling and syncope. Dyspnea on exertion: stable.  Endocrine: Negative for cold intolerance.  Hematologic/Lymphatic: Does not bruise/bleed easily.  Musculoskeletal: Positive for back pain (bulging disc). Negative for joint swelling.  Gastrointestinal: Negative for abdominal pain, anorexia, change in bowel habit, melena and nausea.  Neurological: Negative for headaches and light-headedness.  Psychiatric/Behavioral: Negative for depression and substance abuse.  All other systems reviewed and are negative.  Objective:  Blood pressure (!) 176/70, pulse 74, temperature 97.8 F (36.6 C), height 5\' 5"  (1.651 m), weight 221 lb 12.8 oz (100.6 kg), SpO2 99 %. Body mass index is 36.91 kg/m.    Physical Exam  Constitutional: She is oriented to person, place, and time.  Moderately built and moderately obese in no acute distress.  HENT:  Head: Atraumatic.  Eyes: Conjunctivae are normal.  Neck: Neck supple. No JVD present.  No thyromegaly present.  Short neck  Cardiovascular: Normal rate, regular rhythm and intact distal pulses. Exam reveals no gallop.  Murmur heard.  Harsh midsystolic murmur is present with a grade of 3/6 at the upper right sternal border radiating to the neck. Pulses:      Carotid pulses are on the right side with bruit and on the left side with bruit. Pitting Edema: 2+ bilateral  Pulmonary/Chest: Effort normal and breath sounds normal.  Abdominal: Soft. Bowel sounds are normal.  Obese with mild pannus  Musculoskeletal: Normal range of motion.  Neurological: She is alert and oriented to person, place, and time.  Skin: Skin is warm and dry.  Psychiatric: She has a normal mood and affect.   Radiology: No results found. Laboratory Examination:   CMP Latest Ref Rng & Units 11/15/2018 06/20/2018 04/23/2018  Glucose 65 - 99 mg/dL 83 94 86  BUN 8 - 27 mg/dL 22 30(H) 30(H)  Creatinine 0.57 - 1.00 mg/dL 1.36(H) 1.79(H) 1.95(H)  Sodium 134 - 144 mmol/L 142 145(H) 144  Potassium 3.5 - 5.2 mmol/L 4.4 5.1 5.0  Chloride 96 - 106 mmol/L 104 103 104  CO2 20 - 29 mmol/L 25 24 23   Calcium 8.7 - 10.3 mg/dL 9.7 9.6 9.5  Total Protein 6.0 - 8.5 g/dL - - 6.4  Total Bilirubin 0.0 - 1.2 mg/dL - - 0.3  Alkaline Phos 39 - 117 IU/L - - 83  AST 0 - 40 IU/L - - 18  ALT 0 - 32 IU/L - - 8   CBC Latest Ref Rng & Units 06/20/2018 01/16/2018 01/15/2018  WBC 3.4 - 10.8 x10E3/uL 7.5 8.9 8.0  Hemoglobin 11.1 - 15.9 g/dL 11.0(L) 10.0(L) 10.5(L)  Hematocrit 34.0 - 46.6 % 35.1 32.8(L) 34.2(L)  Platelets 150 - 450 x10E3/uL 429 329 329   Lipid Panel     Component Value Date/Time   CHOL 143 04/23/2018 1118   TRIG 64 04/23/2018 1118   HDL 45 04/23/2018 1118   CHOLHDL 3.2 04/23/2018 1118   LDLCALC 85 04/23/2018 1118   HEMOGLOBIN A1C Lab Results  Component Value Date   HGBA1C 5.6 04/23/2018   TSH Recent Labs    06/20/18 1115  TSH 1.720    Cardiac studies:   Sleep Study [2011]: Negative for sleep apnea   Carotid Artery Duplex  01/01/2018: Stenosis in the right internal carotid artery (16-49%). Stenosis in the right common carotid artery (<50%). Stenosis in the right external carotid artery (<50%). Stenosis in the left internal carotid artery (16-49%). Stenosis in the left common carotid artery (<50%). Stenosis in the left external carotid artery of <50%. Bilateral diffuse heterogeneous plaque noted. Antegrade right vertebral artery flow. Antegrade left vertebral artery flow. Compared to the study done on 12/29/2016, no significant change noted. Consider f/u duplex in 1 yeaer if clinically indicated.  Coronary Angiogram  01/16/18: Patient Sypher stent in Cx, 2.75 x 15 mm DES placed on 01/22/08. Normal LVEF.  Echocardiogram 01/15/2018: Moderate LVH, LVEF 60-65%. Grade 2 diastolic dysfunction, elevated left atrial pressure. Trace aortic stenosis.  Calcified mitral annulus, mild mitral regurgitation. Mild left atrial dilatation. Mild RA/RV dilatation. PASP 40 mmHg.  Assessment:    1. Coronary artery disease of native artery of native heart with stable angina pectoris ([place])   2. Research study patient: Esperion (Bempidoic Acid) for statin intolerant patient   3. Essential hypertension   4. Dyspnea on exertion   5. Nonrheumatic aortic valve stenosis    EKG 11/28/2018: Normal sinus rhythm at rate of 75 bpm, left atrial enlargement, normal axis.  Poor R-wave progression, probably normal variant, cannot exclude anteroseptal infarct old.  No evidence of ischemia. Normal QT interval. No significant change from  EKG 02/19/2018   Recommendations:    Patient is here for follow-up of coronary artery disease and also dyspnea and hypertension.  She has noticed elevated blood pressure, I have added amlodipine 5 mg daily.  Aortic stenotic murmur appears to be much more prominent.  Even the carotid artery bruit appears to be much more prominent.  She will need repeat echocardiogram and also carotid  artery duplex.  I'll like to see her back in 6 weeks for follow-up.  With regard to lipids, she is endorgan clinical trial, ESPERION for statin intolerant patients.  I will see her back in 3 months for follow-up and I would like to see her in the clinic instead of virtual visit in view of her multiple cardiac risk factors.  Adrian Prows, MD, [facility] 11/28/2018, 8:54 PM [facility] [place] Pager: [phone] Office: [phone] If no answer Cell [phone]

## 2018-12-05 ENCOUNTER — Other Ambulatory Visit: Payer: Self-pay | Admitting: Internal Medicine

## 2018-12-05 ENCOUNTER — Other Ambulatory Visit: Payer: Self-pay | Admitting: Nurse Practitioner

## 2018-12-05 ENCOUNTER — Other Ambulatory Visit: Payer: Medicare Other

## 2018-12-05 DIAGNOSIS — B0229 Other postherpetic nervous system involvement: Secondary | ICD-10-CM

## 2018-12-05 NOTE — Telephone Encounter (Signed)
Please refill patient's prescription 

## 2018-12-10 DIAGNOSIS — N183 Chronic kidney disease, stage 3 unspecified: Secondary | ICD-10-CM | POA: Diagnosis not present

## 2018-12-10 DIAGNOSIS — I129 Hypertensive chronic kidney disease with stage 1 through stage 4 chronic kidney disease, or unspecified chronic kidney disease: Secondary | ICD-10-CM | POA: Diagnosis not present

## 2018-12-10 DIAGNOSIS — I5032 Chronic diastolic (congestive) heart failure: Secondary | ICD-10-CM | POA: Diagnosis not present

## 2018-12-10 DIAGNOSIS — D631 Anemia in chronic kidney disease: Secondary | ICD-10-CM | POA: Diagnosis not present

## 2018-12-10 DIAGNOSIS — R55 Syncope and collapse: Secondary | ICD-10-CM | POA: Diagnosis not present

## 2018-12-14 ENCOUNTER — Other Ambulatory Visit: Payer: Self-pay

## 2018-12-14 DIAGNOSIS — Z20822 Contact with and (suspected) exposure to covid-19: Secondary | ICD-10-CM

## 2018-12-15 LAB — NOVEL CORONAVIRUS, NAA: SARS-CoV-2, NAA: NOT DETECTED

## 2018-12-16 ENCOUNTER — Telehealth: Payer: Self-pay

## 2018-12-16 NOTE — Telephone Encounter (Signed)
Received call from patient checking Covid results.  Advised negative.

## 2018-12-18 ENCOUNTER — Ambulatory Visit (INDEPENDENT_AMBULATORY_CARE_PROVIDER_SITE_OTHER): Payer: Medicare Other | Admitting: Nurse Practitioner

## 2018-12-18 ENCOUNTER — Other Ambulatory Visit: Payer: Self-pay

## 2018-12-18 ENCOUNTER — Encounter: Payer: Self-pay | Admitting: Nurse Practitioner

## 2018-12-18 VITALS — BP 130/84 | HR 76 | Temp 98.3°F

## 2018-12-18 DIAGNOSIS — R05 Cough: Secondary | ICD-10-CM | POA: Diagnosis not present

## 2018-12-18 DIAGNOSIS — J069 Acute upper respiratory infection, unspecified: Secondary | ICD-10-CM | POA: Diagnosis not present

## 2018-12-18 DIAGNOSIS — R0602 Shortness of breath: Secondary | ICD-10-CM

## 2018-12-18 DIAGNOSIS — R059 Cough, unspecified: Secondary | ICD-10-CM

## 2018-12-18 MED ORDER — AZITHROMYCIN 250 MG PO TABS: ORAL_TABLET | ORAL | 0 refills | Status: AC

## 2018-12-18 MED ORDER — BENZONATATE 100 MG PO CAPS: 100.0000 mg | ORAL_CAPSULE | Freq: Four times a day (QID) | ORAL | 1 refills | Status: DC | PRN

## 2018-12-18 MED ORDER — ALBUTEROL SULFATE HFA 108 (90 BASE) MCG/ACT IN AERS: 2.0000 | INHALATION_SPRAY | Freq: Four times a day (QID) | RESPIRATORY_TRACT | 1 refills | Status: DC | PRN

## 2018-12-18 MED ORDER — CEFTRIAXONE SODIUM 500 MG IJ SOLR
500.0000 mg | Freq: Once | INTRAMUSCULAR | Status: AC
  Administered 2018-12-18: 500 mg via INTRAMUSCULAR

## 2018-12-18 NOTE — Progress Notes (Signed)
Subjective:     Patient ID: Kathryn [name] , female    DOB: 11/04/1940 , 78 y.o.   MRN: [id number]   Chief Complaint  Patient presents with  . URI    patient stated she has a dry cough, chest discomfort, fatigue and runny nose for the past 2 weeks    HPI  She was tested for Covid on 12/16/2018 which was negative.   URI  This is a new problem. The current episode started 1 to 4 weeks ago (2 weeks ago). The problem has been gradually worsening. There has been no fever. Associated symptoms include coughing and sinus pain (around eyes and frontal of head). Pertinent negatives include no abdominal pain, headaches, sneezing or sore throat. She has tried antihistamine for the symptoms. The treatment provided no relief.     Past Medical History:  Diagnosis Date  . Anxiety    takes Xanax bid prn  . Bruises easily    pt is on Plavix  . Chronic back pain    buldging disc  . Chronic neck pain   . Coronary artery disease    1 stent   . Dysphagia   . Headache(784.0)    related to cervical issues  . History of colonic polyps   . Hyperlipidemia    takes Crestor daily  . Hypertension    takes Coreg and Diovan daily  . Osteoporosis    was taking shot 2xyr;but not taking anymore  . Peripheral vascular disease ([place])   . Seasonal allergies    takes Zyrtec nightly     Family History  Problem Relation Age of Onset  . Heart disease Mother        Heart Dissease before age 82  . Heart attack Mother   . Cancer Father   . Cancer Sister   . Leukemia Sister   . Cancer Brother   . Heart disease Brother        Amputation  . Heart attack Brother   . Heart disease Brother   . Heart attack Brother   . Dementia Other   . Anesthesia problems Neg Hx   . Hypotension Neg Hx   . Malignant hyperthermia Neg Hx   . Pseudochol deficiency Neg Hx      Current Outpatient Medications:  .  amLODipine (NORVASC) 5 MG tablet, Take 1 tablet (5 mg total) by mouth daily., Disp: 30 tablet, Rfl: 2 .   aspirin (ASPIRIN CHILDRENS) 81 MG chewable tablet, Chew 1 tablet (81 mg total) by mouth daily., Disp: 90 tablet, Rfl: 3 .  CALCIUM PO, Take 1 tablet by mouth daily. 600 mg, Disp: , Rfl:  .  carvedilol (COREG) 6.25 MG tablet, TAKE (1) TABLET BY MOUTH TWICE DAILY., Disp: 90 tablet, Rfl: 0 .  cetirizine (ZYRTEC) 10 MG tablet, Take 10 mg by mouth at bedtime., Disp: , Rfl:  .  ferrous gluconate (IRON 27) 240 (27 FE) MG tablet, Take 240 mg by mouth daily. , Disp: , Rfl:  .  furosemide (LASIX) 20 MG tablet, TAKE 1 TABLET BY MOUTH DAILY, Disp: 90 tablet, Rfl: 0 .  Investigational - Study Medication, Take 1 tablet by mouth daily. Study name:008 Additional study details:For cholesterol (Dr. Skip Estimable), Disp: , Rfl:  .  levocetirizine (XYZAL) 5 MG tablet, Take 5 mg by mouth every evening., Disp: , Rfl:  .  Multiple Vitamin (MULTIVITAMIN WITH MINERALS) TABS, Take 1 tablet by mouth daily., Disp: , Rfl:  .  nitroGLYCERIN (NITROSTAT) 0.4 MG SL tablet,  Place 1 tablet (0.4 mg total) under the tongue every 5 (five) minutes x 3 doses as needed for chest pain., Disp: 30 tablet, Rfl: 3 .  pregabalin (LYRICA) 75 MG capsule, TAKE 1 CAPSULE BY MOUTH 3 TIMES A DAY, Disp: 90 capsule, Rfl: 2   Allergies  Allergen Reactions  . Codeine Hypertension  . Shellfish Allergy      Review of Systems  Constitutional: Negative.   HENT: Positive for sinus pain (around eyes and frontal of head). Negative for facial swelling, sneezing and sore throat.   Eyes: Negative.   Respiratory: Positive for cough.   Cardiovascular: Negative.   Gastrointestinal: Negative for abdominal pain.  Neurological: Negative for headaches.  Psychiatric/Behavioral: Negative.      Today's Vitals   12/18/18 1105  BP: 130/84  Pulse: 76  Temp: 98.3 F (36.8 C)  TempSrc: Oral  PainSc: 6   PainLoc: Chest   There is no height or weight on file to calculate BMI.   Objective:  Physical Exam Constitutional:      General: She is not in acute  distress.    Appearance: Normal appearance. She is ill-appearing (non-toxic appearing).  HENT:     Nose: No congestion.     Right Sinus: Frontal sinus tenderness present.     Left Sinus: Frontal sinus tenderness present.  Cardiovascular:     Rate and Rhythm: Normal rate and regular rhythm.     Pulses: Normal pulses.     Heart sounds: Normal heart sounds. No murmur.  Pulmonary:     Effort: Pulmonary effort is normal. No respiratory distress.     Breath sounds: Normal breath sounds.  Skin:    General: Skin is warm and dry.     Capillary Refill: Capillary refill takes less than 2 seconds.  Neurological:     Mental Status: She is alert.         Assessment And Plan:     1. Cough  Persistent cough  Will provide tessalon perles, obtain chest xray and treat with rocephin and zpack - DG Chest 2 View; Future - cefTRIAXone (ROCEPHIN) injection 500 mg - albuterol (VENTOLIN HFA) 108 (90 Base) MCG/ACT inhaler; Inhale 2 puffs into the lungs every 6 (six) hours as needed for wheezing or shortness of breath.  Dispense: 18 g; Refill: 1 - benzonatate (TESSALON PERLES) 100 MG capsule; Take 1 capsule (100 mg total) by mouth every 6 (six) hours as needed.  Dispense: 30 capsule; Refill: 1  2. Shortness of breath  Will send for CXR and provided her with albuterol inhaler  Lung sounds are clear throughout - DG Chest 2 View; Future - albuterol (VENTOLIN HFA) 108 (90 Base) MCG/ACT inhaler; Inhale 2 puffs into the lungs every 6 (six) hours as needed for wheezing or shortness of breath.  Dispense: 18 g; Refill: 1 - benzonatate (TESSALON PERLES) 100 MG capsule; Take 1 capsule (100 mg total) by mouth every 6 (six) hours as needed.  Dispense: 30 capsule; Refill: 1  3. Upper respiratory tract infection, unspecified type  Will treat with rocephin and zpac since she has had a 2 week history of symptoms and is not feeling any better concerned about pneumonia - cefTRIAXone (ROCEPHIN) injection 500 mg -  azithromycin (ZITHROMAX) 250 MG tablet; Take 2 tablets (500 mg) on  Day 1,  followed by 1 tablet (250 mg) once daily on Days 2 through 5.  Dispense: 6 each; Refill: 0   Minette Brine, FNP    THE PATIENT IS ENCOURAGED  TO PRACTICE SOCIAL DISTANCING DUE TO THE COVID-19 PANDEMIC.

## 2018-12-19 ENCOUNTER — Ambulatory Visit (INDEPENDENT_AMBULATORY_CARE_PROVIDER_SITE_OTHER): Payer: Medicare Other

## 2018-12-19 ENCOUNTER — Ambulatory Visit
Admission: RE | Admit: 2018-12-19 | Discharge: 2018-12-19 | Disposition: A | Discharge status: Home / Self Care | Payer: Medicare Other | Source: Ambulatory Visit | Attending: Nurse Practitioner | Admitting: Nurse Practitioner

## 2018-12-19 DIAGNOSIS — I25118 Atherosclerotic heart disease of native coronary artery with other forms of angina pectoris: Secondary | ICD-10-CM

## 2018-12-19 DIAGNOSIS — R0602 Shortness of breath: Secondary | ICD-10-CM

## 2018-12-19 DIAGNOSIS — R0989 Other specified symptoms and signs involving the circulatory and respiratory systems: Secondary | ICD-10-CM | POA: Diagnosis not present

## 2018-12-19 DIAGNOSIS — R05 Cough: Secondary | ICD-10-CM | POA: Diagnosis not present

## 2018-12-19 DIAGNOSIS — I350 Nonrheumatic aortic (valve) stenosis: Secondary | ICD-10-CM

## 2018-12-19 DIAGNOSIS — I10 Essential (primary) hypertension: Secondary | ICD-10-CM | POA: Diagnosis not present

## 2018-12-19 DIAGNOSIS — R059 Cough, unspecified: Secondary | ICD-10-CM

## 2018-12-20 ENCOUNTER — Encounter: Payer: Self-pay | Admitting: Internal Medicine

## 2018-12-23 ENCOUNTER — Other Ambulatory Visit: Payer: Self-pay | Admitting: Cardiology

## 2018-12-23 DIAGNOSIS — I6523 Occlusion and stenosis of bilateral carotid arteries: Secondary | ICD-10-CM

## 2018-12-24 ENCOUNTER — Other Ambulatory Visit: Payer: Self-pay | Admitting: Cardiology

## 2018-12-26 ENCOUNTER — Encounter: Payer: Medicare Other | Admitting: Internal Medicine

## 2018-12-27 ENCOUNTER — Telehealth: Payer: Self-pay

## 2018-12-27 NOTE — Telephone Encounter (Signed)
-----   Message from Adrian Prows, MD sent at 12/22/2018  7:03 AM EST ----- Normal heart function and no change in moderate MR from prior echo in 2019.  MR= Mitral regurgitation. (leakage on the left heart valve)  Similarly TR means tricuspid regurgitation (leakage on the right heart valve). Mild MR or TR are probably normal variants unless physicians feel it is abnormal. Reassure.

## 2018-12-27 NOTE — Telephone Encounter (Signed)
-----   Message from Adrian Prows, MD sent at 12/24/2018  7:21 AM EST ----- Right carotid 60% stenosis slightly progressed from 50% or less and will continue to monitor

## 2019-01-02 ENCOUNTER — Telehealth: Payer: Self-pay

## 2019-01-03 ENCOUNTER — Other Ambulatory Visit: Payer: Medicare Other

## 2019-01-03 NOTE — Progress Notes (Signed)
This encounter was created in error - please disregard.

## 2019-01-14 ENCOUNTER — Ambulatory Visit: Payer: Medicare Other | Admitting: Cardiology

## 2019-01-14 ENCOUNTER — Encounter: Payer: Self-pay | Admitting: Cardiology

## 2019-01-14 ENCOUNTER — Other Ambulatory Visit: Payer: Self-pay

## 2019-01-14 VITALS — BP 158/72 | HR 81 | Ht 64.5 in | Wt 224.5 lb

## 2019-01-14 DIAGNOSIS — I6523 Occlusion and stenosis of bilateral carotid arteries: Secondary | ICD-10-CM | POA: Diagnosis not present

## 2019-01-14 DIAGNOSIS — Z006 Encounter for examination for normal comparison and control in clinical research program: Secondary | ICD-10-CM

## 2019-01-14 DIAGNOSIS — I10 Essential (primary) hypertension: Secondary | ICD-10-CM | POA: Diagnosis not present

## 2019-01-14 DIAGNOSIS — I350 Nonrheumatic aortic (valve) stenosis: Secondary | ICD-10-CM | POA: Diagnosis not present

## 2019-01-14 DIAGNOSIS — I25118 Atherosclerotic heart disease of native coronary artery with other forms of angina pectoris: Secondary | ICD-10-CM

## 2019-01-14 MED ORDER — AMLODIPINE BESYLATE 10 MG PO TABS: 10.0000 mg | ORAL_TABLET | Freq: Every day | ORAL | 3 refills | Status: DC

## 2019-01-14 NOTE — Progress Notes (Signed)
Primary Physician/Referring:  Glendale Chard, MD  Patient ID: Kathryn [name], female    DOB: 11/14/1940, 78 y.o.   MRN: [id number]  Chief Complaint  Patient presents with  . Aortic Stenosis  . Hypertension  . Results    echo/carotid  . Follow-up    6wk   HPI:    Kathryn [name]  is a 78 y.o. AAFwith CAD s/p PCI to LCx 2009, hypertension, statin intolerant hyperlipidemia, CKD stage 3 and mild bilateral asymptomatic carotid stenosis. She has had an episode of syncope that occurred in May 2020 suggestive of vasovagal episodes, no recurrence.    Coronary Angiography on 01/16/18 that showed no obstructive CAD and patent stents from 2009. Patient is not on statin due to intolerance, she is enrolled in esperion trial.   She is here for a 3 month OV for AS and hypertension f/u.  Has persistent mild dyspnea. Denies chest pain or palpitations. No leg edema. No PND or orthopnea.  States her BP is well controlled at home.   Past Medical History:  Diagnosis Date  . Anxiety    takes Xanax bid prn  . Bruises easily    pt is on Plavix  . Chronic back pain    buldging disc  . Chronic neck pain   . Coronary artery disease    1 stent   . Dysphagia   . Headache(784.0)    related to cervical issues  . History of colonic polyps   . Hyperlipidemia    takes Crestor daily  . Hypertension    takes Coreg and Diovan daily  . Osteoporosis    was taking shot 2xyr;but not taking anymore  . Peripheral vascular disease ([place])   . Seasonal allergies    takes Zyrtec nightly   Past Surgical History:  Procedure Laterality Date  . ABDOMINAL HYSTERECTOMY  70's  . BUNIONECTOMY     left foot  . CARDIAC CATHETERIZATION  2009/2011   1 stent placed  . CHOLECYSTECTOMY  2009  . COLONOSCOPY    . ESOPHAGOGASTRODUODENOSCOPY    . LEFT HEART CATH AND CORONARY ANGIOGRAPHY N/A 01/16/2018   Procedure: LEFT HEART CATH AND CORONARY ANGIOGRAPHY;  Surgeon: Nigel Mormon, MD;  Location: [facility];  Service: Cardiovascular;  Laterality: N/A;  . TONSILLECTOMY     at age 18   Social History   Socioeconomic History  . Marital status: Married    Spouse name: Not on file  . Number of children: 4  . Years of education: Not on file  . Highest education level: Not on file  Occupational History  . Occupation: retired  Scientific laboratory technician  . Financial resource strain: Not hard at all  . Food insecurity    Worry: Never true    Inability: Never true  . Transportation needs    Medical: No    Non-medical: No  Tobacco Use  . Smoking status: Former Smoker    Packs/day: 0.25    Years: 5.00    Pack years: 1.25    Quit date: 11/27/1968    Years since quitting: 50.1  . Smokeless tobacco: Never Used  Substance and Sexual Activity  . Alcohol use: No  . Drug use: No  . Sexual activity: Not Currently  Lifestyle  . Physical activity    Days per week: 3 days    Minutes per session: 40 min  . Stress: Not at all  Relationships  . Social Herbalist on phone: More  than three times a week    Gets together: More than three times a week    Attends religious service: More than 4 times per year    Active member of club or organization: Yes    Attends meetings of clubs or organizations: More than 4 times per year    Relationship status: Married  . Intimate partner violence    Fear of current or ex partner: No    Emotionally abused: No    Physically abused: No    Forced sexual activity: No  Other Topics Concern  . Not on file  Social History Narrative  . Not on file   ROS  Review of Systems  Constitution: Negative for chills, decreased appetite, malaise/fatigue and weight gain.  Cardiovascular: Positive for chest pain (left sided post herpetic and chronic) and dyspnea on exertion (stable). Negative for leg swelling and syncope.  Endocrine: Negative for cold intolerance.  Hematologic/Lymphatic: Does not bruise/bleed easily.  Musculoskeletal: Negative for back pain and joint  swelling.  Gastrointestinal: Negative for abdominal pain, anorexia, change in bowel habit, melena and nausea.  Neurological: Negative for headaches and light-headedness.  Psychiatric/Behavioral: Negative for depression and substance abuse.  All other systems reviewed and are negative.  Objective   Vitals with BMI 01/14/2019 12/18/2018 11/28/2018  Height 5' 4.5" - -  Weight 224 lbs 8 oz - -  BMI [id number] - -  Systolic 0000000 [id number]  Diastolic 72 84 70  Pulse 81 76 74      Physical Exam  Constitutional: She is oriented to person, place, and time.  Moderately built and moderately obese in no acute distress.  HENT:  Head: Atraumatic.  Eyes: Conjunctivae are normal.  Neck: Neck supple. No thyromegaly present.  Short neck  Cardiovascular: Normal rate, regular rhythm and intact distal pulses. Exam reveals no gallop.  Murmur heard.  Harsh midsystolic murmur is present with a grade of 3/6 at the upper right sternal border radiating to the neck. Pulses:      Carotid pulses are on the right side with bruit and on the left side with bruit. No edema. No JVD  Pulmonary/Chest: Effort normal and breath sounds normal.  Abdominal: Soft. Bowel sounds are normal.  Obese with mild pannus  Musculoskeletal: Normal range of motion.  Neurological: She is alert and oriented to person, place, and time.  Skin: Skin is warm and dry.  Psychiatric: She has a normal mood and affect.   Laboratory examination:    Recent Labs    04/23/18 1118 06/20/18 1115 11/15/18 1246  NA 144 145* 142  K 5.0 5.1 4.4  CL 104 103 104  CO2 23 24 25   GLUCOSE 86 94 83  BUN 30* 30* 22  CREATININE 1.95* 1.79* 1.36*  CALCIUM 9.5 9.6 9.7  GFRNONAA 24* 27* 37*  GFRAA 28* 31* 43*   CrCl cannot be calculated (Patient's most recent lab result is older than the maximum 21 days allowed.).  CMP Latest Ref Rng & Units 11/15/2018 06/20/2018 04/23/2018  Glucose 65 - 99 mg/dL 83 94 86  BUN 8 - 27 mg/dL 22 30(H) 30(H)  Creatinine 0.57  - 1.00 mg/dL 1.36(H) 1.79(H) 1.95(H)  Sodium 134 - 144 mmol/L 142 145(H) 144  Potassium 3.5 - 5.2 mmol/L 4.4 5.1 5.0  Chloride 96 - 106 mmol/L 104 103 104  CO2 20 - 29 mmol/L 25 24 23   Calcium 8.7 - 10.3 mg/dL 9.7 9.6 9.5  Total Protein 6.0 - 8.5 g/dL - - 6.4  Total Bilirubin 0.0 - 1.2 mg/dL - - 0.3  Alkaline Phos 39 - 117 IU/L - - 83  AST 0 - 40 IU/L - - 18  ALT 0 - 32 IU/L - - 8   CBC Latest Ref Rng & Units 06/20/2018 01/16/2018 01/15/2018  WBC 3.4 - 10.8 x10E3/uL 7.5 8.9 8.0  Hemoglobin 11.1 - 15.9 g/dL 11.0(L) 10.0(L) 10.5(L)  Hematocrit 34.0 - 46.6 % 35.1 32.8(L) 34.2(L)  Platelets 150 - 450 x10E3/uL 429 329 329   Lipid Panel     Component Value Date/Time   CHOL 143 04/23/2018 1118   TRIG 64 04/23/2018 1118   HDL 45 04/23/2018 1118   CHOLHDL 3.2 04/23/2018 1118   LDLCALC 85 04/23/2018 1118   HEMOGLOBIN A1C Lab Results  Component Value Date   HGBA1C 5.6 04/23/2018   TSH Recent Labs    06/20/18 1115  TSH 1.720   Medications and allergies   Allergies  Allergen Reactions  . Codeine Hypertension  . Shellfish Allergy      Current Outpatient Medications  Medication Instructions  . albuterol (VENTOLIN HFA) 108 (90 Base) MCG/ACT inhaler 2 puffs, Inhalation, Every 6 hours PRN  . amLODipine (NORVASC) 10 mg, Oral, Daily  . aspirin (ASPIRIN CHILDRENS) 81 mg, Oral, Daily  . benzonatate (TESSALON PERLES) 100 mg, Oral, Every 6 hours PRN  . CALCIUM PO 1 tablet, Oral, Daily, 600 mg  . carvedilol (COREG) 6.25 MG tablet TAKE (1) TABLET BY MOUTH TWICE DAILY.  . cetirizine (ZYRTEC) 10 mg, Daily at bedtime  . ferrous gluconate (IRON 27) 240 mg, Oral, Daily  . furosemide (LASIX) 20 MG tablet TAKE 1 TABLET BY MOUTH DAILY  . Investigational - Study Medication 1 tablet, Oral, Daily, Study name:[id number] study details:For cholesterol (Dr. Skip Estimable)  . levocetirizine (XYZAL) 5 mg, Oral, Every evening  . Multiple Vitamin (MULTIVITAMIN WITH MINERALS) TABS 1 tablet, Oral, Daily  .  nitroGLYCERIN (NITROSTAT) 0.4 mg, Sublingual, Every 5 min x3 PRN  . pregabalin (LYRICA) 75 MG capsule TAKE 1 CAPSULE BY MOUTH 3 TIMES A DAY    Radiology:  No results found. Cardiac Studies:   Sleep Study  [2011]: Negative for sleep apnea  Coronary Angiogram   01/16/18: Patent Sypher stent in Cx, 2.75 x 15 mm DES placed on 01/22/08. Normal LVEF. NO other significant CAD.  Carotid artery duplex  12/19/2018: Stenosis in the right internal carotid artery (50-69%). Stenosis in the right external carotid artery (<50%). Stenosis in the left internal carotid artery (16-49%). Antegrade right vertebral artery flow. Antegrade left vertebral artery flow. Compared to the study done on 01/01/2018, right ICA stenosis severity has increased from less than 50%. Follow up in six months is appropriate if clinically indicated.  Echocardiogram 12/19/2018: Left ventricle cavity is normal in size. Moderate concentric hypertrophy of the left ventricle. Normal LV systolic function with EF 67%. Normal global wall motion. Doppler evidence of grade I (impaired) diastolic dysfunction, normal LAP.  Left atrial cavity is mildly dilated. Trileaflet aortic valve. Mild aortic valve leaflet calcification. Mild aortic valve stenosis. Aortic valve mean gradient of 10 mmHg, Vmax of 2.1 m/s. Calculated aortic valve area by continuity equation is 1.2 cm. Moderate (Grade II) aortic regurgitation. Mildly restricted mitral valve leaflets. Moderate (Grade II) mitral regurgitation. Mild tricuspid regurgitation. Estimated pulmonary artery systolic pressure is 39 mmHg.  No significant change compared to previous study on 01/15/2018.  Assessment     ICD-10-CM   1. Coronary artery disease of native artery of native heart with stable angina  pectoris ([place])  I25.118   2. Mild aortic stenosis  I35.0   3. Carotid stenosis, asymptomatic, bilateral  I65.23   4. Essential hypertension  I10 amLODipine (NORVASC) 10 MG tablet  5. Research  study patient: Esperion (Bempidoic Acid) for statin intolerant patient  Z00.6      EKG 11/28/2018: Normal sinus rhythm at rate of 75 bpm, left atrial enlargement, normal axis.  Poor R-wave progression, probably normal variant, cannot exclude anteroseptal infarct old.  No evidence of ischemia. Normal QT interval. No significant change from  EKG 02/19/2018    Recommendations:   Meds ordered this encounter  Medications  . amLODipine (NORVASC) 10 MG tablet    Sig: Take 1 tablet (10 mg total) by mouth daily.    Dispense:  90 tablet    Refill:  3    Kathryn [name]  is a 78 y.o. AAFwith CAD s/p PCI to LCx 2009, hypertension, statin intolerant hyperlipidemia, CKD stage 3 and mild bilateral asymptomatic carotid stenosis. She has had an episode of syncope that occurred in May 2020 suggestiv of vasovagal episodes, no recurrence.    Coronary Angiography on 01/16/18 that showed no obstructive CAD and patent stents from 2009. Patient is not on statin due to intolerance, she is enrolled in esperion trial.   On her last office visit I had added amlodipine 5 mg for uncontrolled hypertension, I'll increase it to 10 mg. Patient states her BP is relatively well controlled at home.   I reviewed carotid duplex and echocardiogram due to prominent aortic stenotic murmur.  Fortunately no significant change in aortic stenosis and carotid artery duplex reveals mild progression of right carotid disease.  She is on a statin intolerant trial, LDL is now 85, continue observation for now. Lipids have not been actively checked due to patient  enrolled in clinical trial.  I'll see her back in 6 months or sooner if problems.   Adrian Prows, MD, [facility] 01/14/2019, 12:32 PM [facility] [place] Pager: [phone] Office: [phone] If no answer Cell [phone]

## 2019-01-21 ENCOUNTER — Other Ambulatory Visit: Payer: Self-pay | Admitting: Internal Medicine

## 2019-01-22 ENCOUNTER — Other Ambulatory Visit: Payer: Self-pay

## 2019-01-22 ENCOUNTER — Ambulatory Visit (INDEPENDENT_AMBULATORY_CARE_PROVIDER_SITE_OTHER): Payer: Medicare Other | Admitting: Internal Medicine

## 2019-01-22 ENCOUNTER — Encounter: Payer: Self-pay | Admitting: Internal Medicine

## 2019-01-22 VITALS — BP 150/74 | HR 84 | Temp 98.3°F | Ht 64.5 in | Wt 224.0 lb

## 2019-01-22 DIAGNOSIS — R7309 Other abnormal glucose: Secondary | ICD-10-CM

## 2019-01-22 DIAGNOSIS — J0100 Acute maxillary sinusitis, unspecified: Secondary | ICD-10-CM

## 2019-01-22 DIAGNOSIS — I1310 Hypertensive heart and chronic kidney disease without heart failure, with stage 1 through stage 4 chronic kidney disease, or unspecified chronic kidney disease: Secondary | ICD-10-CM

## 2019-01-22 DIAGNOSIS — N183 Chronic kidney disease, stage 3 unspecified: Secondary | ICD-10-CM

## 2019-01-22 DIAGNOSIS — Z6837 Body mass index (BMI) 37.0-37.9, adult: Secondary | ICD-10-CM

## 2019-01-22 DIAGNOSIS — Z0000 Encounter for general adult medical examination without abnormal findings: Secondary | ICD-10-CM | POA: Diagnosis not present

## 2019-01-22 MED ORDER — PNEUMOCOCCAL 13-VAL CONJ VACC IM SUSP: 0.5000 mL | INTRAMUSCULAR | 0 refills | Status: AC

## 2019-01-22 MED ORDER — AMOXICILLIN-POT CLAVULANATE 875-125 MG PO TABS: 1.0000 | ORAL_TABLET | Freq: Two times a day (BID) | ORAL | 0 refills | Status: DC

## 2019-01-22 NOTE — Patient Instructions (Signed)
Health Maintenance, Female Adopting a healthy lifestyle and getting preventive care are important in promoting health and wellness. Ask your health care provider about:  The right schedule for you to have regular tests and exams.  Things you can do on your own to prevent diseases and keep yourself healthy. What should I know about diet, weight, and exercise? Eat a healthy diet   Eat a diet that includes plenty of vegetables, fruits, low-fat dairy products, and lean protein.  Do not eat a lot of foods that are high in solid fats, added sugars, or sodium. Maintain a healthy weight Body mass index (BMI) is used to identify weight problems. It estimates body fat based on height and weight. Your health care provider can help determine your BMI and help you achieve or maintain a healthy weight. Get regular exercise Get regular exercise. This is one of the most important things you can do for your health. Most adults should:  Exercise for at least 150 minutes each week. The exercise should increase your heart rate and make you sweat (moderate-intensity exercise).  Do strengthening exercises at least twice a week. This is in addition to the moderate-intensity exercise.  Spend less time sitting. Even light physical activity can be beneficial. Watch cholesterol and blood lipids Have your blood tested for lipids and cholesterol at 79 years of age, then have this test every 5 years. Have your cholesterol levels checked more often if:  Your lipid or cholesterol levels are high.  You are older than 79 years of age.  You are at high risk for heart disease. What should I know about cancer screening? Depending on your health history and family history, you may need to have cancer screening at various ages. This may include screening for:  Breast cancer.  Cervical cancer.  Colorectal cancer.  Skin cancer.  Lung cancer. What should I know about heart disease, diabetes, and high blood  pressure? Blood pressure and heart disease  High blood pressure causes heart disease and increases the risk of stroke. This is more likely to develop in people who have high blood pressure readings, are of African descent, or are overweight.  Have your blood pressure checked: ? Every 3-5 years if you are 18-39 years of age. ? Every year if you are 40 years old or older. Diabetes Have regular diabetes screenings. This checks your fasting blood sugar level. Have the screening done:  Once every three years after age 40 if you are at a normal weight and have a low risk for diabetes.  More often and at a younger age if you are overweight or have a high risk for diabetes. What should I know about preventing infection? Hepatitis B If you have a higher risk for hepatitis B, you should be screened for this virus. Talk with your health care provider to find out if you are at risk for hepatitis B infection. Hepatitis C Testing is recommended for:  Everyone born from 1945 through 1965.  Anyone with known risk factors for hepatitis C. Sexually transmitted infections (STIs)  Get screened for STIs, including gonorrhea and chlamydia, if: ? You are sexually active and are younger than 79 years of age. ? You are older than 79 years of age and your health care provider tells you that you are at risk for this type of infection. ? Your sexual activity has changed since you were last screened, and you are at increased risk for chlamydia or gonorrhea. Ask your health care provider if   you are at risk.  Ask your health care provider about whether you are at high risk for HIV. Your health care provider may recommend a prescription medicine to help prevent HIV infection. If you choose to take medicine to prevent HIV, you should first get tested for HIV. You should then be tested every 3 months for as long as you are taking the medicine. Pregnancy  If you are about to stop having your period (premenopausal) and  you may become pregnant, seek counseling before you get pregnant.  Take 400 to 800 micrograms (mcg) of folic acid every day if you become pregnant.  Ask for birth control (contraception) if you want to prevent pregnancy. Osteoporosis and menopause Osteoporosis is a disease in which the bones lose minerals and strength with aging. This can result in bone fractures. If you are 65 years old or older, or if you are at risk for osteoporosis and fractures, ask your health care provider if you should:  Be screened for bone loss.  Take a calcium or vitamin D supplement to lower your risk of fractures.  Be given hormone replacement therapy (HRT) to treat symptoms of menopause. Follow these instructions at home: Lifestyle  Do not use any products that contain nicotine or tobacco, such as cigarettes, e-cigarettes, and chewing tobacco. If you need help quitting, ask your health care provider.  Do not use street drugs.  Do not share needles.  Ask your health care provider for help if you need support or information about quitting drugs. Alcohol use  Do not drink alcohol if: ? Your health care provider tells you not to drink. ? You are pregnant, may be pregnant, or are planning to become pregnant.  If you drink alcohol: ? Limit how much you use to 0-1 drink a day. ? Limit intake if you are breastfeeding.  Be aware of how much alcohol is in your drink. In the [place] one drink equals one 12 oz bottle of beer (355 mL), one 5 oz glass of wine (148 mL), or one 1 oz glass of hard liquor (44 mL). General instructions  Schedule regular health, dental, and eye exams.  Stay current with your vaccines.  Tell your health care provider if: ? You often feel depressed. ? You have ever been abused or do not feel safe at home. Summary  Adopting a healthy lifestyle and getting preventive care are important in promoting health and wellness.  Follow your health care provider's instructions about healthy  diet, exercising, and getting tested or screened for diseases.  Follow your health care provider's instructions on monitoring your cholesterol and blood pressure. This information is not intended to replace advice given to you by your health care provider. Make sure you discuss any questions you have with your health care provider. Document Released: 08/16/2010 Document Revised: 01/24/2018 Document Reviewed: 01/24/2018 Elsevier Patient Education  2020 [employer]  

## 2019-01-22 NOTE — Progress Notes (Signed)
This visit occurred during the SARS-CoV-2 public health emergency.  Safety protocols were in place, including screening questions prior to the visit, additional usage of staff PPE, and extensive cleaning of exam room while observing appropriate contact time as indicated for disinfecting solutions.  Subjective:     Patient ID: Kathryn [name] , female    DOB: Mar 06, 1940 , 78 y.o.   MRN: [id number]   Chief Complaint  Patient presents with  . Annual Exam  . Hypertension    HPI  She is here today for a full physical examination. She is no longer followed by Gyn.   Hypertension This is a chronic problem. The current episode started more than 1 year ago. The problem has been gradually improving since onset. The problem is uncontrolled. Pertinent negatives include no blurred vision, chest pain, palpitations or shortness of breath. Risk factors for coronary artery disease include dyslipidemia, obesity, post-menopausal state and sedentary lifestyle. The current treatment provides moderate improvement. Compliance problems include exercise.  Hypertensive end-organ damage includes kidney disease.     Past Medical History:  Diagnosis Date  . Anxiety    takes Xanax bid prn  . Bruises easily    pt is on Plavix  . Chronic back pain    buldging disc  . Chronic neck pain   . Coronary artery disease    1 stent   . Dysphagia   . Headache(784.0)    related to cervical issues  . History of colonic polyps   . Hyperlipidemia    takes Crestor daily  . Hypertension    takes Coreg and Diovan daily  . Osteoporosis    was taking shot 2xyr;but not taking anymore  . Peripheral vascular disease ([place])   . Seasonal allergies    takes Zyrtec nightly     Family History  Problem Relation Age of Onset  . Heart disease Mother        Heart Dissease before age 81  . Heart attack Mother   . Cancer Father   . Cancer Sister   . Leukemia Sister   . Cancer Brother   . Heart disease Brother         Amputation  . Heart attack Brother   . Heart disease Brother   . Heart attack Brother   . Dementia Other   . Anesthesia problems Neg Hx   . Hypotension Neg Hx   . Malignant hyperthermia Neg Hx   . Pseudochol deficiency Neg Hx      Current Outpatient Medications:  .  albuterol (VENTOLIN HFA) 108 (90 Base) MCG/ACT inhaler, Inhale 2 puffs into the lungs every 6 (six) hours as needed for wheezing or shortness of breath., Disp: 18 g, Rfl: 1 .  amLODipine (NORVASC) 10 MG tablet, Take 1 tablet (10 mg total) by mouth daily., Disp: 90 tablet, Rfl: 3 .  benzonatate (TESSALON PERLES) 100 MG capsule, Take 1 capsule (100 mg total) by mouth every 6 (six) hours as needed., Disp: 30 capsule, Rfl: 1 .  CALCIUM PO, Take 1 tablet by mouth daily. 600 mg, Disp: , Rfl:  .  carvedilol (COREG) 6.25 MG tablet, TAKE ONE TABLET BY MOUTH 2 TIMES A DAY, Disp: 90 tablet, Rfl: 0 .  cetirizine (ZYRTEC) 10 MG tablet, Take 10 mg by mouth at bedtime., Disp: , Rfl:  .  ferrous gluconate (IRON 27) 240 (27 FE) MG tablet, Take 240 mg by mouth daily. , Disp: , Rfl:  .  furosemide (LASIX) 20 MG tablet, TAKE 1  TABLET BY MOUTH DAILY, Disp: 90 tablet, Rfl: 0 .  Investigational - Study Medication, Take 1 tablet by mouth daily. Study name:008 Additional study details:For cholesterol (Dr. Skip Estimable), Disp: , Rfl:  .  levocetirizine (XYZAL) 5 MG tablet, Take 5 mg by mouth every evening., Disp: , Rfl:  .  Multiple Vitamin (MULTIVITAMIN WITH MINERALS) TABS, Take 1 tablet by mouth daily., Disp: , Rfl:  .  nitroGLYCERIN (NITROSTAT) 0.4 MG SL tablet, Place 1 tablet (0.4 mg total) under the tongue every 5 (five) minutes x 3 doses as needed for chest pain., Disp: 30 tablet, Rfl: 3 .  pregabalin (LYRICA) 75 MG capsule, TAKE 1 CAPSULE BY MOUTH 3 TIMES A DAY, Disp: 90 capsule, Rfl: 2   Allergies  Allergen Reactions  . Codeine Hypertension  . Shellfish Allergy       The patient states she uses post menopausal status for birth control. Last LMP  was No LMP recorded. Patient has had a hysterectomy.. Negative for Dysmenorrhea  Negative for: breast discharge, breast lump(s), breast pain and breast self exam. Associated symptoms include abnormal vaginal bleeding. Pertinent negatives include abnormal bleeding (hematology), anxiety, decreased libido, depression, difficulty falling sleep, dyspareunia, history of infertility, nocturia, sexual dysfunction, sleep disturbances, urinary incontinence, urinary urgency, vaginal discharge and vaginal itching. Diet regular.The patient states her exercise level is    . The patient's tobacco use is:  Social History   Tobacco Use  Smoking Status Former Smoker  . Packs/day: 0.25  . Years: 5.00  . Pack years: 1.25  . Quit date: 11/27/1968  . Years since quitting: 50.2  Smokeless Tobacco Never Used  . She has been exposed to passive smoke. The patient's alcohol use is:  Social History   Substance and Sexual Activity  Alcohol Use No    Review of Systems  Constitutional: Negative.   HENT: Positive for postnasal drip, sinus pressure and sinus pain.   Eyes: Negative.  Negative for blurred vision.  Respiratory: Negative.  Negative for shortness of breath.   Cardiovascular: Negative.  Negative for chest pain and palpitations.  Endocrine: Negative.   Genitourinary: Negative.   Musculoskeletal: Negative.   Skin: Negative.   Allergic/Immunologic: Negative.   Neurological: Negative.   Hematological: Negative.   Psychiatric/Behavioral: Negative.      Today's Vitals   01/22/19 1437  BP: (!) 150/74  Pulse: 84  Temp: 98.3 F (36.8 C)  TempSrc: Oral  Weight: 224 lb (101.6 kg)  Height: 5' 4.5" (1.638 m)  PainSc: 5   PainLoc: Ear   Body mass index is 37.86 kg/m.   Objective:  Physical Exam Vitals and nursing note reviewed.  Constitutional:      Appearance: Normal appearance. She is obese.  HENT:     Head: Normocephalic and atraumatic.     Right Ear: Tympanic membrane, ear canal and external  ear normal.     Left Ear: Tympanic membrane, ear canal and external ear normal.     Nose:     Comments: Deferred, masked    Mouth/Throat:     Comments: Deferred, masked Maxillary sinus tender to percussion Eyes:     Extraocular Movements: Extraocular movements intact.     Conjunctiva/sclera: Conjunctivae normal.     Pupils: Pupils are equal, round, and reactive to light.  Cardiovascular:     Rate and Rhythm: Normal rate and regular rhythm.     Pulses: Normal pulses.     Heart sounds: Normal heart sounds.  Pulmonary:     Effort: Pulmonary effort  is normal.     Breath sounds: Normal breath sounds.  Chest:     Breasts: Tanner Score is 5.        Right: Normal.        Left: Normal.  Abdominal:     General: Bowel sounds are normal.     Palpations: Abdomen is soft.     Comments: Rounded, soft, pannus.   Genitourinary:    Comments: deferred Musculoskeletal:        General: Normal range of motion.     Cervical back: Normal range of motion and neck supple.  Skin:    General: Skin is warm and dry.  Neurological:     General: No focal deficit present.     Mental Status: She is alert and oriented to person, place, and time.  Psychiatric:        Mood and Affect: Mood normal.        Behavior: Behavior normal.         Assessment And Plan:     1. Routine general medical examination at health care facility  A full exam was performed.  Importance of monthly self breast exams was discussed with the patient. PATIENT HAS BEEN ADVISED TO GET 30-45 MINUTES REGULAR EXERCISE NO LESS THAN FOUR TO FIVE DAYS PER WEEK - BOTH WEIGHTBEARING EXERCISES AND AEROBIC ARE RECOMMENDED.  SHE IS ADVISED TO FOLLOW A HEALTHY DIET WITH AT LEAST SIX FRUITS/VEGGIES PER DAY, DECREASE INTAKE OF RED MEAT, AND TO INCREASE FISH INTAKE TO TWO DAYS PER WEEK.  MEATS/FISH SHOULD NOT BE FRIED, BAKED OR BROILED IS PREFERABLE.  I SUGGEST WEARING SPF 50 SUNSCREEN ON EXPOSED PARTS AND ESPECIALLY WHEN IN THE DIRECT SUNLIGHT FOR  AN EXTENDED PERIOD OF TIME.  PLEASE AVOID FAST FOOD RESTAURANTS AND INCREASE YOUR WATER INTAKE.  2. Hypertensive heart and renal disease with renal failure, stage 1 through stage 4 or unspecified chronic kidney disease, without heart failure  Chronic, uncontrolled. She admits that she has yet to take meds today. Also states she rushed to appointment. EKG performed, no new changes noted. She will rto in four months for re-evaluation. Importance of medication and dietary compliance was discussed with the patient.   - EKG 12-Lead - CBC no Diff - CMP14+EGFR  3. Stage 3 chronic kidney disease, unspecified whether stage 3a or 3b CKD  Chronic, yet stable. She is encouraged to stay well hydrated. She is also followed by Nephrology. Maintenance of optimal bp control to prevent further progression of CKD was discussed with the patient.   4. Acute non-recurrent maxillary sinusitis  She was given rx Augmentin and encouraged to take full abx course.   5. Other abnormal glucose  HER A1C HAS BEEN ELEVATED IN THE PAST. I WILL CHECK AN A1C, BMET TODAY. SHE WAS ENCOURAGED TO AVOID SUGARY BEVERAGES AND PROCESSED FOODS INCLUDNG BREADS, RICE AND PASTA.  - Hemoglobin A1c  6. Class 2 severe obesity due to excess calories with serious comorbidity and body mass index (BMI) of 37.0 to 37.9 in adult [facility])  She is encouraged to strive for BMI less than 32 to decrease cardiac risk. She is encouraged to increase her daily activity and to cut out sugary beverages from her diet.      [name], MD    THE PATIENT IS ENCOURAGED TO PRACTICE SOCIAL DISTANCING DUE TO THE COVID-19 PANDEMIC.

## 2019-01-23 LAB — HEMOGLOBIN A1C
Est. average glucose Bld gHb Est-mCnc: 114 mg/dL
Hgb A1c MFr Bld: 5.6 % (ref 4.8–5.6)

## 2019-01-23 LAB — CBC
Hematocrit: 35.8 % (ref 34.0–46.6)
Hemoglobin: 11 g/dL — ABNORMAL LOW (ref 11.1–15.9)
MCH: 22.5 pg — ABNORMAL LOW (ref 26.6–33.0)
MCHC: 30.7 g/dL — ABNORMAL LOW (ref 31.5–35.7)
MCV: 73 fL — ABNORMAL LOW (ref 79–97)
Platelets: 405 10*3/uL (ref 150–450)
RBC: 4.89 x10E6/uL (ref 3.77–5.28)
RDW: 16.1 % — ABNORMAL HIGH (ref 11.7–15.4)
WBC: 9.3 10*3/uL (ref 3.4–10.8)

## 2019-01-23 LAB — CMP14+EGFR
ALT: 14 IU/L (ref 0–32)
AST: 20 IU/L (ref 0–40)
Albumin/Globulin Ratio: 1.9 (ref 1.2–2.2)
Albumin: 4.5 g/dL (ref 3.7–4.7)
Alkaline Phosphatase: 92 IU/L (ref 39–117)
BUN/Creatinine Ratio: 22 (ref 12–28)
BUN: 29 mg/dL — ABNORMAL HIGH (ref 8–27)
Bilirubin Total: 0.3 mg/dL (ref 0.0–1.2)
CO2: 22 mmol/L (ref 20–29)
Calcium: 10.2 mg/dL (ref 8.7–10.3)
Chloride: 103 mmol/L (ref 96–106)
Creatinine, Ser: 1.33 mg/dL — ABNORMAL HIGH (ref 0.57–1.00)
GFR calc Af Amer: 44 mL/min/{1.73_m2} — ABNORMAL LOW (ref >59)
GFR calc non Af Amer: 38 mL/min/{1.73_m2} — ABNORMAL LOW (ref >59)
Globulin, Total: 2.4 g/dL (ref 1.5–4.5)
Glucose: 85 mg/dL (ref 65–99)
Potassium: 4.7 mmol/L (ref 3.5–5.2)
Sodium: 140 mmol/L (ref 134–144)
Total Protein: 6.9 g/dL (ref 6.0–8.5)

## 2019-02-12 ENCOUNTER — Encounter: Payer: Self-pay | Admitting: Internal Medicine

## 2019-02-13 ENCOUNTER — Other Ambulatory Visit: Payer: Self-pay | Admitting: Nurse Practitioner

## 2019-02-13 ENCOUNTER — Other Ambulatory Visit: Payer: Self-pay

## 2019-02-13 ENCOUNTER — Telehealth: Payer: Self-pay

## 2019-02-13 DIAGNOSIS — B0229 Other postherpetic nervous system involvement: Secondary | ICD-10-CM

## 2019-02-13 MED ORDER — PRAVASTATIN SODIUM 40 MG PO TABS: ORAL_TABLET | ORAL | 1 refills | Status: DC

## 2019-02-13 NOTE — Telephone Encounter (Signed)
Called pt to see if she is willing to take medication for her chol once weekly. Pt agreed. rx sent for pravastatin 40mg 

## 2019-02-14 NOTE — Telephone Encounter (Signed)
Refill request for Pregabalin (Lyrica)

## 2019-02-25 ENCOUNTER — Telehealth: Payer: Self-pay

## 2019-03-07 ENCOUNTER — Other Ambulatory Visit: Payer: Self-pay | Admitting: Internal Medicine

## 2019-03-19 ENCOUNTER — Telehealth: Payer: Self-pay

## 2019-03-28 ENCOUNTER — Ambulatory Visit (INDEPENDENT_AMBULATORY_CARE_PROVIDER_SITE_OTHER): Payer: Medicare Other

## 2019-03-28 DIAGNOSIS — N183 Chronic kidney disease, stage 3 unspecified: Secondary | ICD-10-CM

## 2019-03-28 DIAGNOSIS — R7303 Prediabetes: Secondary | ICD-10-CM

## 2019-03-28 DIAGNOSIS — I1310 Hypertensive heart and chronic kidney disease without heart failure, with stage 1 through stage 4 chronic kidney disease, or unspecified chronic kidney disease: Secondary | ICD-10-CM

## 2019-03-28 DIAGNOSIS — I10 Essential (primary) hypertension: Secondary | ICD-10-CM | POA: Diagnosis not present

## 2019-03-28 DIAGNOSIS — I214 Non-ST elevation (NSTEMI) myocardial infarction: Secondary | ICD-10-CM | POA: Diagnosis not present

## 2019-03-29 ENCOUNTER — Other Ambulatory Visit: Payer: Self-pay

## 2019-03-29 ENCOUNTER — Telehealth: Payer: Self-pay

## 2019-03-29 NOTE — Chronic Care Management (AMB) (Signed)
Chronic Care Management   Follow Up Note   03/28/2019 Name: Kathryn [name] MRN: [id number] DOB: 1940/09/20  Referred by: Glendale Chard, MD Reason for referral : Chronic Care Management (CCM RNCM Telephone Follow up )   Kathryn [name] is a 79 y.o. year old female who is a primary care patient of Glendale Chard, MD. The CCM team was consulted for assistance with chronic disease management and care coordination needs.    Review of patient status, including review of consultants reports, relevant laboratory and other test results, and collaboration with appropriate care team members and the patient's provider was performed as part of comprehensive patient evaluation and provision of chronic care management services.    SDOH (Social Determinants of Health) screening performed today: None. See Care Plan for related entries.   Placed outbound call to patient for a CCM RN CM follow up.   Outpatient Encounter Medications as of 03/28/2019  Medication Sig  . acetaminophen (TYLENOL) 325 MG tablet Take 325 mg by mouth every 6 (six) hours as needed for mild pain.  [name] amLODipine (NORVASC) 10 MG tablet Take 1 tablet (10 mg total) by mouth daily.  [name] CALCIUM PO Take 1 tablet by mouth daily. 600 mg  . carvedilol (COREG) 6.25 MG tablet TAKE ONE TABLET BY MOUTH 2 TIMES A DAY  . cetirizine (ZYRTEC) 10 MG tablet Take 10 mg by mouth at bedtime.  . ferrous sulfate 324 MG TBEC Take 324 mg by mouth every other day.  . furosemide (LASIX) 20 MG tablet TAKE 1 TABLET BY MOUTH DAILY  . Investigational - Study Medication Take 1 tablet by mouth daily. Study name:008 Additional study details:For cholesterol (Dr. Skip Estimable)  . Multiple Vitamin (MULTIVITAMIN WITH MINERALS) TABS Take 1 tablet by mouth daily.  . nitroGLYCERIN (NITROSTAT) 0.4 MG SL tablet Place 1 tablet (0.4 mg total) under the tongue every 5 (five) minutes x 3 doses as needed for chest pain.  . pravastatin (PRAVACHOL) 40 MG tablet Take one tablet by  mouth once a week.  . pregabalin (LYRICA) 75 MG capsule TAKE 1 CAPSULE BY MOUTH 3 TIMES A DAY  . albuterol (VENTOLIN HFA) 108 (90 Base) MCG/ACT inhaler Inhale 2 puffs into the lungs every 6 (six) hours as needed for wheezing or shortness of breath. (Patient not taking: Reported on 03/29/2019)  . benzonatate (TESSALON PERLES) 100 MG capsule Take 1 capsule (100 mg total) by mouth every 6 (six) hours as needed. (Patient not taking: Reported on 03/29/2019)  . ferrous gluconate (IRON 27) 240 (27 FE) MG tablet Take 240 mg by mouth daily.   [name] levocetirizine (XYZAL) 5 MG tablet Take 5 mg by mouth every evening.   No facility-administered encounter medications on file as of 03/28/2019.     Objective:  Lab Results  Component Value Date   HGBA1C 5.6 01/22/2019   HGBA1C 5.6 04/23/2018   HGBA1C 5.6 12/21/2017   Lab Results  Component Value Date   MICROALBUR 30 11/15/2018   LDLCALC 85 04/23/2018   CREATININE 1.33 (H) 01/22/2019   BP Readings from Last 3 Encounters:  01/22/19 (!) 150/74  01/14/19 (!) 158/72  12/18/18 130/84    Goals Addressed      Patient Stated   . "to keep my blood pressure better controlled" (pt-stated)       Current Barriers:  [name] Knowledge Deficits related to disease process and Self Health management of HTN . Chronic Disease Management support and education needs related to Essential HTN, CKDIII, Hypertensive nephropathy, prediabetes,  NSTEMI   Nurse Case Manager Clinical Goal(s):  [name] Over the next 90 days, patient will work with the [place] CM and PCP to address needs related to disease education and support to help improve Self Health management of HTN  Interventions:  . Evaluation of current treatment plan related to HTN and patient's adherence to plan as established by provider. . Provided education to patient re: education with rationale of importance of sodium restriction and adhering to a heart healthy, low fat diet; Educated patient on target BP <130/80 . Reviewed  medications with patient and discussed indication, dosage and frequency of all prescribed medications; medication reconciliation completed with no discrepancies noted; patient denies having noted SE and or financial hardship . Discussed plans with patient for ongoing care management follow up and provided patient with direct contact information for care management team . Advised patient, providing education and rationale, to monitor blood pressure daily and record, calling CCM RN and or PCP for findings outside established parameters.  . Provided patient with printed  educational materials related to What is High Blood Pressure?; Why Should I Lower Sodium?; African-Americans and High Blood Pressure; Life's Simple 7; BP log . Reviewed scheduled/upcoming provider appointments including: OV w/Dr. Baird Cancer scheduled for 05/23/19 @2 :15 PM   Patient Self Care Activities:  . Self administers medications as prescribed . Attends all scheduled provider appointments . Calls pharmacy for medication refills . Performs ADL's independently . Performs IADL's independently . Calls provider office for new concerns or questions  Initial goal documentation        Plan:   Telephone follow up appointment with care management team member scheduled for: 05/09/19  Barb Merino, RN, BSN, CCM Care Management Coordinator [place] [facility]  Direct Phone: [phone]

## 2019-03-29 NOTE — Patient Instructions (Addendum)
Visit Information  Goals Addressed      Patient Stated   . "to keep my blood pressure better controlled" (pt-stated)       Current Barriers:  [name] Knowledge Deficits related to disease process and Self Health management of HTN . Chronic Disease Management support and education needs related to Essential HTN, CKDIII, Hypertensive nephropathy, prediabetes, NSTEMI   Nurse Case Manager Clinical Goal(s):  [name] Over the next 90 days, patient will work with the [place] CM and PCP to address needs related to disease education and support to help improve Self Health management of HTN  Interventions:  . Evaluation of current treatment plan related to HTN and patient's adherence to plan as established by provider. . Provided education to patient re: education with rationale of importance of sodium restriction and adhering to a heart healthy, low fat diet; Educated patient on target BP <130/80 . Reviewed medications with patient and discussed indication, dosage and frequency of all prescribed medications; medication reconciliation completed with no discrepancies noted; patient denies having noted SE and or financial hardship . Discussed plans with patient for ongoing care management follow up and provided patient with direct contact information for care management team . Advised patient, providing education and rationale, to monitor blood pressure daily and record, calling CCM RN and or PCP for findings outside established parameters.  . Provided patient with printed  educational materials related to What is High Blood Pressure?; Why Should I Lower Sodium?; African-Americans and High Blood Pressure; Life's Simple 7; BP log . Reviewed scheduled/upcoming provider appointments including: OV w/Dr. Baird Cancer scheduled for 05/23/19 @2 :15 PM   Patient Self Care Activities:  . Self administers medications as prescribed . Attends all scheduled provider appointments . Calls pharmacy for medication refills . Performs ADL's  independently . Performs IADL's independently . Calls provider office for new concerns or questions  Initial goal documentation        The patient verbalized understanding of instructions provided today and declined a print copy of patient instruction materials.   Telephone follow up appointment with care management team member scheduled for: 05/09/19  Barb Merino, RN, BSN, CCM Care Management Coordinator [place] [facility]  Direct Phone: [phone]

## 2019-04-19 ENCOUNTER — Other Ambulatory Visit: Payer: Self-pay | Admitting: Nurse Practitioner

## 2019-04-19 DIAGNOSIS — B0229 Other postherpetic nervous system involvement: Secondary | ICD-10-CM

## 2019-04-22 ENCOUNTER — Other Ambulatory Visit: Payer: Self-pay | Admitting: Nurse Practitioner

## 2019-04-22 DIAGNOSIS — B0229 Other postherpetic nervous system involvement: Secondary | ICD-10-CM

## 2019-04-22 MED ORDER — PREGABALIN 75 MG PO CAPS: 75.0000 mg | ORAL_CAPSULE | Freq: Three times a day (TID) | ORAL | 3 refills | Status: DC

## 2019-04-23 ENCOUNTER — Other Ambulatory Visit: Payer: Self-pay

## 2019-04-23 ENCOUNTER — Ambulatory Visit (INDEPENDENT_AMBULATORY_CARE_PROVIDER_SITE_OTHER): Payer: Medicare Other | Admitting: Internal Medicine

## 2019-04-23 ENCOUNTER — Encounter: Payer: Self-pay | Admitting: Internal Medicine

## 2019-04-23 VITALS — BP 132/76 | HR 78 | Temp 97.7°F | Ht 64.5 in | Wt 213.8 lb

## 2019-04-23 DIAGNOSIS — E7800 Pure hypercholesterolemia, unspecified: Secondary | ICD-10-CM | POA: Diagnosis not present

## 2019-04-23 DIAGNOSIS — L299 Pruritus, unspecified: Secondary | ICD-10-CM

## 2019-04-23 DIAGNOSIS — J0100 Acute maxillary sinusitis, unspecified: Secondary | ICD-10-CM | POA: Diagnosis not present

## 2019-04-23 DIAGNOSIS — Z6836 Body mass index (BMI) 36.0-36.9, adult: Secondary | ICD-10-CM

## 2019-04-23 DIAGNOSIS — F419 Anxiety disorder, unspecified: Secondary | ICD-10-CM | POA: Diagnosis not present

## 2019-04-23 MED ORDER — AMOXICILLIN-POT CLAVULANATE 875-125 MG PO TABS: 1.0000 | ORAL_TABLET | Freq: Two times a day (BID) | ORAL | 0 refills | Status: AC

## 2019-04-23 MED ORDER — ALPRAZOLAM 0.25 MG PO TABS: 0.2500 mg | ORAL_TABLET | Freq: Two times a day (BID) | ORAL | 0 refills | Status: DC | PRN

## 2019-04-24 ENCOUNTER — Other Ambulatory Visit: Payer: Self-pay | Admitting: Internal Medicine

## 2019-04-24 LAB — CMP14+EGFR
ALT: 11 IU/L (ref 0–32)
AST: 18 IU/L (ref 0–40)
Albumin/Globulin Ratio: 1.6 (ref 1.2–2.2)
Albumin: 4.4 g/dL (ref 3.7–4.7)
Alkaline Phosphatase: 99 IU/L (ref 39–117)
BUN/Creatinine Ratio: 11 — ABNORMAL LOW (ref 12–28)
BUN: 17 mg/dL (ref 8–27)
Bilirubin Total: 0.4 mg/dL (ref 0.0–1.2)
CO2: 22 mmol/L (ref 20–29)
Calcium: 10.1 mg/dL (ref 8.7–10.3)
Chloride: 104 mmol/L (ref 96–106)
Creatinine, Ser: 1.53 mg/dL — ABNORMAL HIGH (ref 0.57–1.00)
GFR calc Af Amer: 37 mL/min/{1.73_m2} — ABNORMAL LOW (ref >59)
GFR calc non Af Amer: 32 mL/min/{1.73_m2} — ABNORMAL LOW (ref >59)
Globulin, Total: 2.8 g/dL (ref 1.5–4.5)
Glucose: 87 mg/dL (ref 65–99)
Potassium: 4.8 mmol/L (ref 3.5–5.2)
Sodium: 142 mmol/L (ref 134–144)
Total Protein: 7.2 g/dL (ref 6.0–8.5)

## 2019-04-24 LAB — LIPID PANEL
Chol/HDL Ratio: 3.4 ratio (ref 0.0–4.4)
Cholesterol, Total: 185 mg/dL (ref 100–199)
HDL: 54 mg/dL (ref >39)
LDL Chol Calc (NIH): 115 mg/dL — ABNORMAL HIGH (ref 0–99)
Triglycerides: 90 mg/dL (ref 0–149)
VLDL Cholesterol Cal: 16 mg/dL (ref 5–40)

## 2019-04-24 LAB — CBC
Hematocrit: 40.4 % (ref 34.0–46.6)
Hemoglobin: 12.5 g/dL (ref 11.1–15.9)
MCH: 23.2 pg — ABNORMAL LOW (ref 26.6–33.0)
MCHC: 30.9 g/dL — ABNORMAL LOW (ref 31.5–35.7)
MCV: 75 fL — ABNORMAL LOW (ref 79–97)
Platelets: 463 10*3/uL — ABNORMAL HIGH (ref 150–450)
RBC: 5.39 x10E6/uL — ABNORMAL HIGH (ref 3.77–5.28)
RDW: 14.8 % (ref 11.7–15.4)
WBC: 8.4 10*3/uL (ref 3.4–10.8)

## 2019-04-24 LAB — ANTINUCLEAR ANTIBODIES, IFA: ANA Titer 1: NEGATIVE

## 2019-05-09 ENCOUNTER — Telehealth: Payer: Self-pay

## 2019-05-14 DIAGNOSIS — M5416 Radiculopathy, lumbar region: Secondary | ICD-10-CM | POA: Diagnosis not present

## 2019-05-16 ENCOUNTER — Telehealth: Payer: Self-pay | Admitting: Internal Medicine

## 2019-05-16 NOTE — Telephone Encounter (Signed)
I called the patient to ask if she can come at 2:00 instead of 2:15 on 05/23/19. (I was going to schedule AWV @ 2:00, then move appt w/ Dr. Baird Cancer to 2:45).  The patient wasn't available, so I'll try again later if the 2:45 slot with Dr. Baird Cancer is still open.

## 2019-05-19 NOTE — Progress Notes (Signed)
This visit occurred during the SARS-CoV-2 public health emergency.  Safety protocols were in place, including screening questions prior to the visit, additional usage of staff PPE, and extensive cleaning of exam room while observing appropriate contact time as indicated for disinfecting solutions.  Subjective:     Patient ID: Kathryn [name] , female    DOB: 1940/05/09 , 79 y.o.   MRN: [id number]   Chief Complaint  Patient presents with  . Sinusitis  . Medication Refill    alprazolam  . Itching all over    HPI  She is here today for further evaluation of sinus congestion. She thinks she has infection. Denies ill contacts. States she is always masked when outside of her home and reports she has been social distancing. Also with a dry cough. Denies worsening sob. Denies fever/chills. She has tried some OTC meds w/o relief of her sx.   Sinusitis This is a new problem. The current episode started in the past 7 days. The problem is unchanged. There has been no fever. Her pain is at a severity of 7/10. The pain is moderate. Associated symptoms include congestion, coughing, headaches and sinus pressure. Pertinent negatives include no ear pain, shortness of breath, sneezing or sore throat. Past treatments include acetaminophen. The treatment provided no relief.  Medication Refill Associated symptoms include congestion, coughing and headaches. Pertinent negatives include no sore throat.     Past Medical History:  Diagnosis Date  . Anxiety    takes Xanax bid prn  . Bruises easily    pt is on Plavix  . Chronic back pain    buldging disc  . Chronic neck pain   . Coronary artery disease    1 stent   . Dysphagia   . Headache(784.0)    related to cervical issues  . History of colonic polyps   . Hyperlipidemia    takes Crestor daily  . Hypertension    takes Coreg and Diovan daily  . Osteoporosis    was taking shot 2xyr;but not taking anymore  . Peripheral vascular disease ([place])   .  Seasonal allergies    takes Zyrtec nightly     Family History  Problem Relation Age of Onset  . Heart disease Mother        Heart Dissease before age 84  . Heart attack Mother   . Cancer Father   . Cancer Sister   . Leukemia Sister   . Cancer Brother   . Heart disease Brother        Amputation  . Heart attack Brother   . Heart disease Brother   . Heart attack Brother   . Dementia Other   . Anesthesia problems Neg Hx   . Hypotension Neg Hx   . Malignant hyperthermia Neg Hx   . Pseudochol deficiency Neg Hx      Current Outpatient Medications:  .  acetaminophen (TYLENOL) 325 MG tablet, Take 325 mg by mouth every 6 (six) hours as needed for mild pain., Disp: , Rfl:  .  ALPRAZolam (XANAX) 0.25 MG tablet, Take 1 tablet (0.25 mg total) by mouth 2 (two) times daily as needed for anxiety., Disp: 30 tablet, Rfl: 0 .  amLODipine (NORVASC) 10 MG tablet, Take 1 tablet (10 mg total) by mouth daily., Disp: 90 tablet, Rfl: 3 .  CALCIUM PO, Take 1 tablet by mouth daily. 600 mg, Disp: , Rfl:  .  cetirizine (ZYRTEC) 10 MG tablet, Take 10 mg by mouth at bedtime., Disp: ,  Rfl:  .  ferrous sulfate 324 MG TBEC, Take 324 mg by mouth every other day., Disp: , Rfl:  .  furosemide (LASIX) 20 MG tablet, TAKE 1 TABLET BY MOUTH DAILY, Disp: 90 tablet, Rfl: 0 .  Investigational - Study Medication, Take 1 tablet by mouth daily. Study name:008 Additional study details:For cholesterol (Dr. Skip Estimable), Disp: , Rfl:  .  levocetirizine (XYZAL) 5 MG tablet, Take 5 mg by mouth every evening., Disp: , Rfl:  .  Multiple Vitamin (MULTIVITAMIN WITH MINERALS) TABS, Take 1 tablet by mouth daily., Disp: , Rfl:  .  nitroGLYCERIN (NITROSTAT) 0.4 MG SL tablet, Place 1 tablet (0.4 mg total) under the tongue every 5 (five) minutes x 3 doses as needed for chest pain., Disp: 30 tablet, Rfl: 3 .  pravastatin (PRAVACHOL) 40 MG tablet, Take one tablet by mouth once a week., Disp: 15 tablet, Rfl: 1 .  pregabalin (LYRICA) 75 MG capsule,  Take 1 capsule (75 mg total) by mouth 3 (three) times daily., Disp: 90 capsule, Rfl: 3 .  albuterol (VENTOLIN HFA) 108 (90 Base) MCG/ACT inhaler, Inhale 2 puffs into the lungs every 6 (six) hours as needed for wheezing or shortness of breath. (Patient not taking: Reported on 03/29/2019), Disp: 18 g, Rfl: 1 .  benzonatate (TESSALON PERLES) 100 MG capsule, Take 1 capsule (100 mg total) by mouth every 6 (six) hours as needed. (Patient not taking: Reported on 03/29/2019), Disp: 30 capsule, Rfl: 1 .  carvedilol (COREG) 6.25 MG tablet, TAKE ONE TABLET BY MOUTH 2 TIMES A DAY, Disp: 90 tablet, Rfl: 0   Allergies  Allergen Reactions  . Codeine Hypertension  . Shellfish Allergy      Review of Systems  Constitutional: Negative.   HENT: Positive for congestion and sinus pressure. Negative for ear pain, sneezing and sore throat.   Respiratory: Positive for cough. Negative for shortness of breath.   Cardiovascular: Negative.   Gastrointestinal: Negative.   Neurological: Positive for headaches.  Psychiatric/Behavioral: The patient is nervous/anxious.        She wants refill of anxiety meds. She reports taking alprazolam as needed.      Today's Vitals   04/23/19 1052  BP: 132/76  Pulse: 78  Temp: 97.7 F (36.5 C)  TempSrc: Oral  Weight: 213 lb 12.8 oz (97 kg)  Height: 5' 4.5" (1.638 m)  PainSc: 0-No pain   Body mass index is 36.13 kg/m.   Objective:  Physical Exam Vitals and nursing note reviewed.  Constitutional:      Appearance: Normal appearance. She is obese.  HENT:     Head: Normocephalic and atraumatic.     Comments: Frontal sinuses tender to palpation    Right Ear: Tympanic membrane, ear canal and external ear normal. There is no impacted cerumen.     Left Ear: Tympanic membrane, ear canal and external ear normal. There is no impacted cerumen.  Cardiovascular:     Rate and Rhythm: Normal rate and regular rhythm.     Heart sounds: Normal heart sounds.  Pulmonary:     Effort:  Pulmonary effort is normal.     Breath sounds: Normal breath sounds.  Skin:    General: Skin is warm.  Neurological:     General: No focal deficit present.     Mental Status: She is alert.  Psychiatric:        Mood and Affect: Mood normal.        Behavior: Behavior normal.  Assessment And Plan:     1. Acute non-recurrent maxillary sinusitis  She was given rx Augmentin to take as directed. She is encouraged to complete full course of abx. She is also encouraged to avoid dairy until her sx resolve.   2. Anxiety  Chronic. She was given refill of alprazolam. Review of the [place] CSRS was performed in accordance of the [place] prior to dispensing any controlled drugs.   3. Pruritus  She is encouraged to stay well hydrated. I will check labs as listed below. She is also advised to apply moisturizer twice daily and to increase her intake of healthy fats.   - CMP14+EGFR - ANA, IFA (with reflex) - CBC no Diff  4. Pure hypercholesterolemia  Chronic. I will check lipid panel and LFTs today. She is encouraged to avoid fried foods and to incorporate more exercise into her daily routine.   - Lipid panel  5. 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 Also advised to incorporate more activity into her daily routine.   [name], MD    THE PATIENT IS ENCOURAGED TO PRACTICE SOCIAL DISTANCING DUE TO THE COVID-19 PANDEMIC.

## 2019-05-23 ENCOUNTER — Other Ambulatory Visit: Payer: Self-pay

## 2019-05-23 ENCOUNTER — Ambulatory Visit (INDEPENDENT_AMBULATORY_CARE_PROVIDER_SITE_OTHER): Payer: Medicare Other

## 2019-05-23 ENCOUNTER — Encounter: Payer: Self-pay | Admitting: Internal Medicine

## 2019-05-23 ENCOUNTER — Ambulatory Visit: Payer: Medicare Other | Admitting: Internal Medicine

## 2019-05-23 ENCOUNTER — Ambulatory Visit (INDEPENDENT_AMBULATORY_CARE_PROVIDER_SITE_OTHER): Payer: Medicare Other | Admitting: Internal Medicine

## 2019-05-23 VITALS — BP 132/64 | HR 89 | Temp 98.4°F | Ht 65.0 in | Wt 216.2 lb

## 2019-05-23 DIAGNOSIS — I1310 Hypertensive heart and chronic kidney disease without heart failure, with stage 1 through stage 4 chronic kidney disease, or unspecified chronic kidney disease: Secondary | ICD-10-CM

## 2019-05-23 DIAGNOSIS — Z23 Encounter for immunization: Secondary | ICD-10-CM | POA: Diagnosis not present

## 2019-05-23 DIAGNOSIS — I10 Essential (primary) hypertension: Secondary | ICD-10-CM

## 2019-05-23 DIAGNOSIS — N183 Chronic kidney disease, stage 3 unspecified: Secondary | ICD-10-CM

## 2019-05-23 DIAGNOSIS — I25118 Atherosclerotic heart disease of native coronary artery with other forms of angina pectoris: Secondary | ICD-10-CM

## 2019-05-23 DIAGNOSIS — Z0000 Encounter for general adult medical examination without abnormal findings: Secondary | ICD-10-CM

## 2019-05-23 DIAGNOSIS — M4807 Spinal stenosis, lumbosacral region: Secondary | ICD-10-CM | POA: Diagnosis not present

## 2019-05-23 DIAGNOSIS — R7303 Prediabetes: Secondary | ICD-10-CM

## 2019-05-23 LAB — POCT URINALYSIS DIPSTICK
Bilirubin, UA: NEGATIVE
Glucose, UA: NEGATIVE
Ketones, UA: NEGATIVE
Leukocytes, UA: NEGATIVE
Nitrite, UA: NEGATIVE
Protein, UA: NEGATIVE
Spec Grav, UA: 1.02 (ref 1.010–1.025)
Urobilinogen, UA: 0.2 E.U./dL
pH, UA: 6 (ref 5.0–8.0)

## 2019-05-23 LAB — POCT UA - MICROALBUMIN
Creatinine, POC: 300 mg/dL
Microalbumin Ur, POC: 80 mg/L

## 2019-05-23 MED ORDER — PREVNAR 13 IM SUSP: 0.5000 mL | INTRAMUSCULAR | 0 refills | Status: AC

## 2019-05-23 NOTE — Progress Notes (Signed)
This visit occurred during the SARS-CoV-2 public health emergency.  Safety protocols were in place, including screening questions prior to the visit, additional usage of staff PPE, and extensive cleaning of exam room while observing appropriate contact time as indicated for disinfecting solutions.  Subjective:     Patient ID: Kathryn [name] , female    DOB: 28-Sep-1940 , 79 y.o.   MRN: [id number]   Chief Complaint  Patient presents with  . Hypertension    HPI  She presents for BP check. She reports compliance with meds.   Hypertension This is a chronic problem. The current episode started more than 1 year ago. The problem has been gradually improving since onset. The problem is controlled. Pertinent negatives include no blurred vision, chest pain, palpitations or shortness of breath. Risk factors for coronary artery disease include dyslipidemia, obesity, post-menopausal state and sedentary lifestyle. The current treatment provides moderate improvement. Compliance problems include exercise.  Hypertensive end-organ damage includes kidney disease and CAD/MI.     Past Medical History:  Diagnosis Date  . Anxiety    takes Xanax bid prn  . Bruises easily    pt is on Plavix  . Chronic back pain    buldging disc  . Chronic neck pain   . Coronary artery disease    1 stent   . Dysphagia   . Headache(784.0)    related to cervical issues  . History of colonic polyps   . Hyperlipidemia    takes Crestor daily  . Hypertension    takes Coreg and Diovan daily  . Osteoporosis    was taking shot 2xyr;but not taking anymore  . Peripheral vascular disease ([place])   . Seasonal allergies    takes Zyrtec nightly     Family History  Problem Relation Age of Onset  . Heart disease Mother        Heart Dissease before age 57  . Heart attack Mother   . Cancer Father   . Cancer Sister   . Leukemia Sister   . Cancer Brother   . Heart disease Brother        Amputation  . Heart attack Brother    . Heart disease Brother   . Heart attack Brother   . Dementia Other   . Anesthesia problems Neg Hx   . Hypotension Neg Hx   . Malignant hyperthermia Neg Hx   . Pseudochol deficiency Neg Hx      Current Outpatient Medications:  .  acetaminophen (TYLENOL) 325 MG tablet, Take 325 mg by mouth every 6 (six) hours as needed for mild pain., Disp: , Rfl:  .  albuterol (VENTOLIN HFA) 108 (90 Base) MCG/ACT inhaler, Inhale 2 puffs into the lungs every 6 (six) hours as needed for wheezing or shortness of breath. (Patient not taking: Reported on 03/29/2019), Disp: 18 g, Rfl: 1 .  ALPRAZolam (XANAX) 0.25 MG tablet, Take 1 tablet (0.25 mg total) by mouth 2 (two) times daily as needed for anxiety., Disp: 30 tablet, Rfl: 0 .  amLODipine (NORVASC) 10 MG tablet, Take 1 tablet (10 mg total) by mouth daily., Disp: 90 tablet, Rfl: 3 .  CALCIUM PO, Take 1 tablet by mouth daily. 600 mg, Disp: , Rfl:  .  carvedilol (COREG) 6.25 MG tablet, TAKE ONE TABLET BY MOUTH 2 TIMES A DAY, Disp: 90 tablet, Rfl: 0 .  cetirizine (ZYRTEC) 10 MG tablet, Take 10 mg by mouth at bedtime., Disp: , Rfl:  .  ferrous sulfate 324 MG TBEC,  Take 324 mg by mouth every other day., Disp: , Rfl:  .  furosemide (LASIX) 20 MG tablet, TAKE 1 TABLET BY MOUTH DAILY, Disp: 90 tablet, Rfl: 0 .  Investigational - Study Medication, Take 1 tablet by mouth daily. Study name:008 Additional study details:For cholesterol (Dr. Skip Estimable), Disp: , Rfl:  .  levocetirizine (XYZAL) 5 MG tablet, Take 5 mg by mouth every evening., Disp: , Rfl:  .  Multiple Vitamin (MULTIVITAMIN WITH MINERALS) TABS, Take 1 tablet by mouth daily., Disp: , Rfl:  .  nitroGLYCERIN (NITROSTAT) 0.4 MG SL tablet, Place 1 tablet (0.4 mg total) under the tongue every 5 (five) minutes x 3 doses as needed for chest pain., Disp: 30 tablet, Rfl: 3 .  pneumococcal 13-valent conjugate vaccine (PREVNAR 13) SUSP injection, Inject 0.5 mLs into the muscle tomorrow at 10 am for 1 dose., Disp: 0.5 mL, Rfl:  0 .  pravastatin (PRAVACHOL) 40 MG tablet, Take one tablet by mouth once a week., Disp: 15 tablet, Rfl: 1 .  pregabalin (LYRICA) 75 MG capsule, Take 1 capsule (75 mg total) by mouth 3 (three) times daily., Disp: 90 capsule, Rfl: 3   Allergies  Allergen Reactions  . Codeine Hypertension  . Shellfish Allergy      Review of Systems  Constitutional: Negative.   Eyes: Negative for blurred vision.  Respiratory: Negative.  Negative for shortness of breath.   Cardiovascular: Negative.  Negative for chest pain and palpitations.  Gastrointestinal: Negative.   Musculoskeletal: Positive for arthralgias.       She reports having chronic back pain. Her sx are worsening. Followed by Dr. Mina Marble. She does not feel like her concerns are being addressed. Hasn't had any radiographic studies in at least 2 years. The pain has affected her ability to exercise and complete ADLs without difficulty.   Neurological: Negative.   Psychiatric/Behavioral: Negative.      Today's Vitals   05/23/19 1431  BP: 132/64  Pulse: 89  Temp: 98.4 F (36.9 C)  TempSrc: Oral  Weight: 216 lb 3.2 oz (98.1 kg)  Height: 5\' 5"  (1.651 m)   Body mass index is 35.98 kg/m.   Objective:  Physical Exam Vitals and nursing note reviewed.  Constitutional:      Appearance: Normal appearance. She is obese.  HENT:     Head: Normocephalic and atraumatic.  Cardiovascular:     Rate and Rhythm: Normal rate and regular rhythm.     Heart sounds: Normal heart sounds.  Pulmonary:     Effort: Pulmonary effort is normal.     Breath sounds: Normal breath sounds.  Skin:    General: Skin is warm.  Neurological:     General: No focal deficit present.     Mental Status: She is alert.  Psychiatric:        Mood and Affect: Mood normal.        Behavior: Behavior normal.         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 She is aware that optimal BP control is less than 120/80. Importance of regular exercise was discussed with the patient.   2. Stage 3 chronic kidney disease, unspecified whether stage 3a or 3b CKD  Chronic, yet stable. Previous BMP results reviewed in full detail. She is encouraged to stay well hydrated.   3. Coronary artery disease of native  artery of native heart with stable angina pectoris (HCC)  Chronic, yet stable. Importance of statin compliance, compliance with heart-healthy diet and exercise was discussed with the patient in full detail. Advised to aim for at least 150 minutes of exercise per week.   4. Prediabetes  Previous hba1c results reviewed in full detail.   5. Spinal stenosis of lumbosacral region  Chronic, with worsening symptoms. She agrees to MRI for further evaluation of her condition. She is currently followed by Ortho, Dr. Mina Marble. I will forward her results to him for further review once available. She is in agreement with her treatment plan.  - MR Lumbar Spine Wo Contrast; Future     [name], MD    THE PATIENT IS ENCOURAGED TO PRACTICE SOCIAL DISTANCING DUE TO THE COVID-19 PANDEMIC.

## 2019-05-23 NOTE — Progress Notes (Addendum)
This visit occurred during the SARS-CoV-2 public health emergency.  Safety protocols were in place, including screening questions prior to the visit, additional usage of staff PPE, and extensive cleaning of exam room while observing appropriate contact time as indicated for disinfecting solutions.  Subjective:   Kathryn [name] is a 79 y.o. female who presents for Medicare Annual (Subsequent) preventive examination.  Review of Systems:  n/a Cardiac Risk Factors include: advanced age (>8men, >58 women);hypertension;obesity (BMI >30kg/m2)     Objective:     Vitals: BP 132/64 (BP Location: Left Arm, Patient Position: Sitting, Cuff Size: Normal)   Pulse 89   Temp 98.4 F (36.9 C) (Oral)   Ht 5\' 5"  (1.651 m)   Wt 216 lb 3.2 oz (98.1 kg)   SpO2 97%   BMI 35.98 kg/m   Body mass index is 35.98 kg/m.  Advanced Directives 05/23/2019 11/15/2018 05/24/2018 01/14/2018 05/11/2016 03/17/2011 03/08/2011  Does Patient Have a Medical Advance Directive? Yes Yes No No No Patient does not have advance directive;Patient would not like information Patient does not have advance directive;Patient would not like information  Type of Scientist, forensic Power of [place];Living will Living will - - - - -  Copy of [place] in Chart? No - copy requested - - - - - -  Would patient like information on creating a medical advance directive? - - No - Patient declined No - Patient declined - - -  Pre-existing out of facility DNR order (yellow form or pink MOST form) - - - - - No No    Tobacco Social History   Tobacco Use  Smoking Status Former Smoker  . Packs/day: 0.25  . Years: 5.00  . Pack years: 1.25  . Quit date: 11/27/1968  . Years since quitting: 50.5  Smokeless Tobacco Never Used     Counseling given: Not Answered   Clinical Intake:  Pre-visit preparation completed: Yes  Pain : 0-10 Pain Score: 8  Pain Type: Chronic pain Pain Location: Back Pain Orientation:  Lower Pain Radiating Towards: down legs Pain Descriptors / Indicators: Shooting Pain Onset: More than a month ago Pain Frequency: Constant     Nutritional Status: BMI > 30  Obese Nutritional Risks: None Diabetes: No  How often do you need to have someone help you when you read instructions, pamphlets, or other written materials from your doctor or pharmacy?: 1 - Never  Interpreter Needed?: No  Information entered by :: NAllen LPN  Past Medical History:  Diagnosis Date  . Anxiety    takes Xanax bid prn  . Bruises easily    pt is on Plavix  . Chronic back pain    buldging disc  . Chronic neck pain   . Coronary artery disease    1 stent   . Dysphagia   . Headache(784.0)    related to cervical issues  . History of colonic polyps   . Hyperlipidemia    takes Crestor daily  . Hypertension    takes Coreg and Diovan daily  . Osteoporosis    was taking shot 2xyr;but not taking anymore  . Peripheral vascular disease ([place])   . Seasonal allergies    takes Zyrtec nightly   Past Surgical History:  Procedure Laterality Date  . ABDOMINAL HYSTERECTOMY  70's  . BUNIONECTOMY     left foot  . CARDIAC CATHETERIZATION  2009/2011   1 stent placed  . CHOLECYSTECTOMY  2009  . COLONOSCOPY    . ESOPHAGOGASTRODUODENOSCOPY    .  LEFT HEART CATH AND CORONARY ANGIOGRAPHY N/A 01/16/2018   Procedure: LEFT HEART CATH AND CORONARY ANGIOGRAPHY;  Surgeon: Nigel Mormon, MD;  Location: [facility] [place] [facility];  Service: Cardiovascular;  Laterality: N/A;  . TONSILLECTOMY     at age 30   Family History  Problem Relation Age of Onset  . Heart disease Mother        Heart Dissease before age 60  . Heart attack Mother   . Cancer Father   . Cancer Sister   . Leukemia Sister   . Cancer Brother   . Heart disease Brother        Amputation  . Heart attack Brother   . Heart disease Brother   . Heart attack Brother   . Dementia Other   . Anesthesia problems Neg Hx   . Hypotension Neg Hx   .  Malignant hyperthermia Neg Hx   . Pseudochol deficiency Neg Hx    Social History   Socioeconomic History  . Marital status: Married    Spouse name: Not on file  . Number of children: 4  . Years of education: Not on file  . Highest education level: Not on file  Occupational History  . Occupation: retired  Tobacco Use  . Smoking status: Former Smoker    Packs/day: 0.25    Years: 5.00    Pack years: 1.25    Quit date: 11/27/1968    Years since quitting: 50.5  . Smokeless tobacco: Never Used  Substance and Sexual Activity  . Alcohol use: No  . Drug use: No  . Sexual activity: Not Currently  Other Topics Concern  . Not on file  Social History Narrative  . Not on file   Social Determinants of Health   Financial Resource Strain: Low Risk   . Difficulty of Paying Living Expenses: Not hard at all  Food Insecurity: No Food Insecurity  . Worried About Charity fundraiser in the Last Year: Never true  . Ran Out of Food in the Last Year: Never true  Transportation Needs: No Transportation Needs  . Lack of Transportation (Medical): No  . Lack of Transportation (Non-Medical): No  Physical Activity: Insufficiently Active  . Days of Exercise per Week: 7 days  . Minutes of Exercise per Session: 20 min  Stress: No Stress Concern Present  . Feeling of Stress : Not at all  Social Connections: Not Isolated  . Frequency of Communication with Friends and Family: More than three times a week  . Frequency of Social Gatherings with Friends and Family: More than three times a week  . Attends Religious Services: More than 4 times per year  . Active Member of Clubs or Organizations: Yes  . Attends Archivist Meetings: More than 4 times per year  . Marital Status: Married    Outpatient Encounter Medications as of 05/23/2019  Medication Sig  . acetaminophen (TYLENOL) 325 MG tablet Take 325 mg by mouth every 6 (six) hours as needed for mild pain.  [name] ALPRAZolam (XANAX) 0.25 MG tablet  Take 1 tablet (0.25 mg total) by mouth 2 (two) times daily as needed for anxiety.  [name] amLODipine (NORVASC) 10 MG tablet Take 1 tablet (10 mg total) by mouth daily.  [name] CALCIUM PO Take 1 tablet by mouth daily. 600 mg  . carvedilol (COREG) 6.25 MG tablet TAKE ONE TABLET BY MOUTH 2 TIMES A DAY  . cetirizine (ZYRTEC) 10 MG tablet Take 10 mg by mouth at bedtime.  [name]  ferrous sulfate 324 MG TBEC Take 324 mg by mouth every other day.  . furosemide (LASIX) 20 MG tablet TAKE 1 TABLET BY MOUTH DAILY  . Investigational - Study Medication Take 1 tablet by mouth daily. Study name:008 Additional study details:For cholesterol (Dr. Skip Estimable)  . levocetirizine (XYZAL) 5 MG tablet Take 5 mg by mouth every evening.  . Multiple Vitamin (MULTIVITAMIN WITH MINERALS) TABS Take 1 tablet by mouth daily.  . nitroGLYCERIN (NITROSTAT) 0.4 MG SL tablet Place 1 tablet (0.4 mg total) under the tongue every 5 (five) minutes x 3 doses as needed for chest pain.  . pravastatin (PRAVACHOL) 40 MG tablet Take one tablet by mouth once a week.  . pregabalin (LYRICA) 75 MG capsule Take 1 capsule (75 mg total) by mouth 3 (three) times daily.  [name] albuterol (VENTOLIN HFA) 108 (90 Base) MCG/ACT inhaler Inhale 2 puffs into the lungs every 6 (six) hours as needed for wheezing or shortness of breath. (Patient not taking: Reported on 03/29/2019)  . pneumococcal 13-valent conjugate vaccine (PREVNAR 13) SUSP injection Inject 0.5 mLs into the muscle tomorrow at 10 am for 1 dose.   No facility-administered encounter medications on file as of 05/23/2019.    Activities of Daily Living In your present state of health, do you have any difficulty performing the following activities: 05/23/2019 11/15/2018  Hearing? N N  Vision? N N  Difficulty concentrating or making decisions? N N  Walking or climbing stairs? Y Y  Comment - due to back and leg pain  Dressing or bathing? N N  Doing errands, shopping? N N  Preparing Food and eating ? N N  Using the Toilet? N  N  In the past six months, have you accidently leaked urine? N N  Do you have problems with loss of bowel control? N N  Managing your Medications? N N  Managing your Finances? N N  Housekeeping or managing your Housekeeping? N N  Some recent data might be hidden    Patient Care Team: Glendale Chard, MD as PCP - General (Internal Medicine) Adrian Prows, MD as Attending Physician (Cardiology) Daneen Schick as Social Worker [place], Claudette Stapler, RN as Case Manager    Assessment:   This is a routine wellness examination for [place].  Exercise Activities and Dietary recommendations Current Exercise Habits: Home exercise routine, Type of exercise: walking, Time (Minutes): 20, Frequency (Times/Week): 7, Weekly Exercise (Minutes/Week): 140  Goals    . 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 Over the next 60 days, patient will work with RN CM to address needs related to referral for outpatient PT  Interventions:   CCM Telephone initial follow up with patient to establish goals . Evaluation of current treatment plan related to pain management for sciatica and patient's adherence to plan as established by provider . Provided education to patient re: the benefits of stretching, walking and performing light exercise to help keep joints lubricated and muscles strong . Provided education to patient re: the benefits from receiving PT to help develop a home exercise plan and assist with balancing and strengthening (plan to start after COVID-19) . Reviewed medications with  patient and discussed Dr. Baird Cancer approval to increase Lyrica to 75 mg  tid . Collaborated with [facility] regarding receipt of new Rx for increased Lyrica (spoke with Arnette Norris who confirmed this has been received), pt advised  . Discussed plans with patient for ongoing care management follow up and provided patient with direct contact information for care management team . Reviewed scheduled/upcoming provider appointments including: f/u with Orthopedic for steroid injection to lumbar spine, and PCP f/u with Dr. Baird Cancer scheduled for 08/20/18 . Provided patient with RNCM contact # and availability . Scheduled a CCM follow up call with patient for about 2 weeks   Patient Self Care Activities:  Verbalizes understanding of the education/information provided today . Self administers medications as prescribed . Attends all scheduled provider appointments . Calls pharmacy for medication refills . Attends church or other social activities . Performs ADL's independently . Performs IADL's independently . Calls provider office for new concerns or questions  Initial goal documentation      . "I don't know if I have an OTC benefit" (pt-stated)     Current Barriers:  [name] Knowledge Deficits related to Health Plan Benefit options, specifically for OTC benefits  Nurse Case Manager Clinical Goal(s):  Over the next 30 days, patient will verbalize basic understanding of her [employer] OTC benefit option.  Interventions:   CCM Telephone initial follow up with patient to establish goals . Advised patient to call member services, phone # located on the back of her insurance card to inquire about her OTC benefits  Collaborated with [place] regarding [employer] OTC benefit options . Discussed plans with patient for ongoing care management follow up and provided patient with direct contact information for care management team . Provided RNCM contact # and hours of availability  . Scheduled a follow up visit with patient for 2-3 weeks  Patient Self Care  Activities:   Verbalizes understanding of the education/information provided today . Self administers medications as prescribed . Attends all scheduled provider appointments . Calls pharmacy for medication refills . Attends church or other social activities . Performs ADL's independently . Performs IADL's independently . Calls provider office for new concerns or questions  Initial goal documentation      . "I have some anxiety's" (pt-stated)     Current Barriers:  [name] Knowledge Deficits related to Self Health management for Anxiety   Nurse Case Manager Clinical Goal(s):  [name] Over the next 30 days, patient will verbalize understanding of plan for treatment of Anxiety.   Interventions:   CCM Telephone initial follow up with patient to establish goals . Evaluation of current treatment plan related to treatment for Anxiety and patient's adherence to plan as established by provider . Provided education to patient re: relaxation techniques and pursed lip deep breathing exercises in order slow breathing during times of anxiety . Reviewed medications with patient and discussed when to call the doctor for ineffective coping and or worsening or persistent depression . Discussed plans with patient for ongoing care management follow up and provided patient with direct contact information for care management team . Provided RNCM contact # and hours of availability  . Scheduled a follow up visit with patient for about 2 weeks  Patient Self Care Activities:   Verbalizes understanding of the education/information provided today . Self administers medications as prescribed . Attends all scheduled provider appointments . Calls pharmacy for medication refills . Attends church or other social activities . Performs ADL's independently . Performs IADL's  independently . Calls provider office for new concerns or questions  Initial goal documentation      . "to keep my blood pressure better  controlled" (pt-stated)     Current Barriers:  [name] Knowledge Deficits related to disease process and Self Health management of HTN . Chronic Disease Management support and education needs related to Essential HTN, CKDIII, Hypertensive nephropathy, prediabetes, NSTEMI   Nurse Case Manager Clinical Goal(s):  [name] Over the next 90 days, patient will work with the [facility] CM and PCP to address needs related to disease education and support to help improve Self Health management of HTN  Interventions:  . Evaluation of current treatment plan related to HTN and patient's adherence to plan as established by provider. . Provided education to patient re: education with rationale of importance of sodium restriction and adhering to a heart healthy, low fat diet; Educated patient on target BP <130/80 . Reviewed medications with patient and discussed indication, dosage and frequency of all prescribed medications; medication reconciliation completed with no discrepancies noted; patient denies having noted SE and or financial hardship . Discussed plans with patient for ongoing care management follow up and provided patient with direct contact information for care management team . Advised patient, providing education and rationale, to monitor blood pressure daily and record, calling CCM RN and or PCP for findings outside established parameters.  . Provided patient with printed  educational materials related to What is High Blood Pressure?; Why Should I Lower Sodium?; African-Americans and High Blood Pressure; Life's Simple 7; BP log . Reviewed scheduled/upcoming provider appointments including: OV w/Dr. Baird Cancer scheduled for 05/23/19 @2 :15 PM   Patient Self Care Activities:  . Self administers medications as prescribed . Attends all scheduled provider appointments . Calls pharmacy for medication refills . Performs ADL's independently . Performs IADL's independently . Calls provider office for new concerns or  questions  Initial goal documentation     . Weight (lb) < 200 lb (90.7 kg)     11/15/2018, wants to get below 200 pounds    . Weight (lb) < 200 lb (90.7 kg)     05/23/2019, wants to weigh 175-185       Fall Risk Fall Risk  05/23/2019 12/18/2018 11/15/2018 08/20/2018 06/19/2018  Falls in the past year? 1 0 0 0 1  Comment tripped - - - -  Number falls in past yr: 0 - 0 - 1  Injury with Fall? 0 - - - 0  Risk for fall due to : History of fall(s);Medication side effect - Medication side effect - -  Follow up Falls evaluation completed;Education provided;Falls prevention discussed - Falls evaluation completed;Education provided;Falls prevention discussed - -   Is the patient's home free of loose throw rugs in walkways, pet beds, electrical cords, etc?   yes      Grab bars in the bathroom? yes      Handrails on the stairs?   yes      Adequate lighting?   yes  Timed Get Up and Go performed: n/a  Depression Screen PHQ 2/9 Scores 05/23/2019 12/18/2018 11/15/2018 08/20/2018  PHQ - 2 Score 0 0 0 0  PHQ- 9 Score - - 2 -     Cognitive Function     6CIT Screen 05/23/2019 11/15/2018  What Year? 0 points 0 points  What month? 3 points 0 points  What time? 0 points 0 points  Count back from 20 0 points 0 points  Months in reverse 0 points 0  points  Repeat phrase 2 points 4 points  Total Score 5 4    Immunization History  Administered Date(s) Administered  . DTaP 10/25/2012  . Influenza, High Dose Seasonal PF 12/21/2017, 11/15/2018  . PFIZER SARS-COV-2 Vaccination 03/01/2019, 03/22/2019  . Pneumococcal-Unspecified 03/12/2012, 07/15/2013  . Zoster Recombinat (Shingrix) 11/01/2018    Qualifies for Shingles Vaccine? yes  Screening Tests Health Maintenance  Topic Date Due  . PNA vac Low Risk Adult (2 of 2 - PCV13) 07/16/2014  . INFLUENZA VACCINE  09/15/2019  . TETANUS/TDAP  10/26/2022  . DEXA SCAN  Completed    Cancer Screenings: Lung: Low Dose CT Chest recommended if Age 54-80 years, 30  pack-year currently smoking OR have quit w/in 15years. Patient does not qualify. Breast:  Up to date on Mammogram? Yes   Up to date of Bone Density/Dexa? Yes Colorectal: not required  Additional Screenings: : Hepatitis C Screening: n/a     Plan:    Patient wants to weigh 175-185 pounds.   I have personally reviewed and noted the following in the patient's chart:   . Medical and social history . Use of alcohol, tobacco or illicit drugs  . Current medications and supplements . Functional ability and status . Nutritional status . Physical activity . Advanced directives . List of other physicians . Hospitalizations, surgeries, and ER visits in previous 12 months . Vitals . Screenings to include cognitive, depression, and falls . Referrals and appointments  In addition, I have reviewed and discussed with patient certain preventive protocols, quality metrics, and best practice recommendations. A written personalized care plan for preventive services as well as general preventive health recommendations were provided to patient.     Kellie Simmering, LPN  [id number]

## 2019-05-23 NOTE — Patient Instructions (Signed)
[facility] Drue Second   Sciatica  Sciatica is pain, weakness, tingling, or loss of feeling (numbness) along the sciatic nerve. The sciatic nerve starts in the lower back and goes down the back of each leg. Sciatica usually goes away on its own or with treatment. Sometimes, sciatica may come back (recur). What are the causes? This condition happens when the sciatic nerve is pinched or has pressure put on it. This may be the result of:  A disk in between the bones of the spine bulging out too far (herniated disk).  Changes in the spinal disks that occur with aging.  A condition that affects a muscle in the butt.  Extra bone growth near the sciatic nerve.  A break (fracture) of the area between your hip bones (pelvis).  Pregnancy.  Tumor. This is rare. What increases the risk? You are more likely to develop this condition if you:  Play sports that put pressure or stress on the spine.  Have poor strength and ease of movement (flexibility).  Have had a back injury in the past.  Have had back surgery.  Sit for long periods of time.  Do activities that involve bending or lifting over and over again.  Are very overweight (obese). What are the signs or symptoms? Symptoms can vary from mild to very bad. They may include:  Any of these problems in the lower back, leg, hip, or butt: ? Mild tingling, loss of feeling, or dull aches. ? Burning sensations. ? Sharp pains.  Loss of feeling in the back of the calf or the sole of the foot.  Leg weakness.  Very bad back pain that makes it hard to move. These symptoms may get worse when you cough, sneeze, or laugh. They may also get worse when you sit or stand for long periods of time. How is this treated? This condition often gets better without any treatment. However, treatment may include:  Changing or cutting back on physical activity when you have pain.  Doing exercises and stretching.  Putting ice or heat  on the affected area.  Medicines that help: ? To relieve pain and swelling. ? To relax your muscles.  Shots (injections) of medicines that help to relieve pain, irritation, and swelling.  Surgery. Follow these instructions at home: Medicines  Take over-the-counter and prescription medicines only as told by your doctor.  Ask your doctor if the medicine prescribed to you: ? Requires you to avoid driving or using heavy machinery. ? Can cause trouble pooping (constipation). You may need to take these steps to prevent or treat trouble pooping:  Drink enough fluids to keep your pee (urine) pale yellow.  Take over-the-counter or prescription medicines.  Eat foods that are high in fiber. These include beans, whole grains, and fresh fruits and vegetables.  Limit foods that are high in fat and sugar. These include fried or sweet foods. Managing pain      If told, put ice on the affected area. ? Put ice in a plastic bag. ? Place a towel between your skin and the bag. ? Leave the ice on for 20 minutes, 2-3 times a day.  If told, put heat on the affected area. Use the heat source that your doctor tells you to use, such as a moist heat pack or a heating pad. ? Place a towel between your skin and the heat source. ? Leave the heat on for 20-30 minutes. ? Remove the heat if your skin turns bright red.  This is very important if you are unable to feel pain, heat, or cold. You may have a greater risk of getting burned. Activity   Return to your normal activities as told by your doctor. Ask your doctor what activities are safe for you.  Avoid activities that make your symptoms worse.  Take short rests during the day. ? When you rest for a long time, do some physical activity or stretching between periods of rest. ? Avoid sitting for a long time without moving. Get up and move around at least one time each hour.  Exercise and stretch regularly, as told by your doctor.  Do not lift  anything that is heavier than 10 lb (4.5 kg) while you have symptoms of sciatica. ? Avoid lifting heavy things even when you do not have symptoms. ? Avoid lifting heavy things over and over.  When you lift objects, always lift in a way that is safe for your body. To do this, you should: ? Bend your knees. ? Keep the object close to your body. ? Avoid twisting. General instructions  Stay at a healthy weight.  Wear comfortable shoes that support your feet. Avoid wearing high heels.  Avoid sleeping on a mattress that is too soft or too hard. You might have less pain if you sleep on a mattress that is firm enough to support your back.  Keep all follow-up visits as told by your doctor. This is important. Contact a doctor if:  You have pain that: ? Wakes you up when you are sleeping. ? Gets worse when you lie down. ? Is worse than the pain you have had in the past. ? Lasts longer than 4 weeks.  You lose weight without trying. Get help right away if:  You cannot control when you pee (urinate) or poop (have a bowel movement).  You have weakness in any of these areas and it gets worse: ? Lower back. ? The area between your hip bones. ? Butt. ? Legs.  You have redness or swelling of your back.  You have a burning feeling when you pee. Summary  Sciatica is pain, weakness, tingling, or loss of feeling (numbness) along the sciatic nerve.  This condition happens when the sciatic nerve is pinched or has pressure put on it.  Sciatica can cause pain, tingling, or loss of feeling (numbness) in the lower back, legs, hips, and butt.  Treatment often includes rest, exercise, medicines, and putting ice or heat on the affected area. This information is not intended to replace advice given to you by your health care provider. Make sure you discuss any questions you have with your health care provider. Document Revised: 02/19/2018 Document Reviewed: 02/19/2018 Elsevier Patient Education   [place].

## 2019-05-23 NOTE — Patient Instructions (Signed)
Kathryn [name] , Thank you for taking time to come for your Medicare Wellness Visit. I appreciate your ongoing commitment to your health goals. Please review the following plan we discussed and let me know if I can assist you in the future.   Screening recommendations/referrals: Colonoscopy: not required Mammogram: 09/2018 Bone Density: 03/2016 Recommended yearly ophthalmology/optometry visit for glaucoma screening and checkup Recommended yearly dental visit for hygiene and checkup  Vaccinations: Influenza vaccine: 11/2018 Pneumococcal vaccine: 07/2013 Tdap vaccine: 10/2012 Shingles vaccine: discussed    Advanced directives: Please bring a copy of your POA (Power of [place]) and/or Living Will to your next appointment.   Conditions/risks identified: obesity  Next appointment:    Preventive Care 21 Years and Older, Female Preventive care refers to lifestyle choices and visits with your health care provider that can promote health and wellness. What does preventive care include?  A yearly physical exam. This is also called an annual well check.  Dental exams once or twice a year.  Routine eye exams. Ask your health care provider how often you should have your eyes checked.  Personal lifestyle choices, including:  Daily care of your teeth and gums.  Regular physical activity.  Eating a healthy diet.  Avoiding tobacco and drug use.  Limiting alcohol use.  Practicing safe sex.  Taking low-dose aspirin every day.  Taking vitamin and mineral supplements as recommended by your health care provider. What happens during an annual well check? The services and screenings done by your health care provider during your annual well check will depend on your age, overall health, lifestyle risk factors, and family history of disease. Counseling  Your health care provider may ask you questions about your:  Alcohol use.  Tobacco use.  Drug use.  Emotional well-being.  Home and  relationship well-being.  Sexual activity.  Eating habits.  History of falls.  Memory and ability to understand (cognition).  Work and work Statistician.  Reproductive health. Screening  You may have the following tests or measurements:  Height, weight, and BMI.  Blood pressure.  Lipid and cholesterol levels. These may be checked every 5 years, or more frequently if you are over 41 years old.  Skin check.  Lung cancer screening. You may have this screening every year starting at age 75 if you have a 30-pack-year history of smoking and currently smoke or have quit within the past 15 years.  Fecal occult blood test (FOBT) of the stool. You may have this test every year starting at age 77.  Flexible sigmoidoscopy or colonoscopy. You may have a sigmoidoscopy every 5 years or a colonoscopy every 10 years starting at age 6.  Hepatitis C blood test.  Hepatitis B blood test.  Sexually transmitted disease (STD) testing.  Diabetes screening. This is done by checking your blood sugar (glucose) after you have not eaten for a while (fasting). You may have this done every 1-3 years.  Bone density scan. This is done to screen for osteoporosis. You may have this done starting at age 62.  Mammogram. This may be done every 1-2 years. Talk to your health care provider about how often you should have regular mammograms. Talk with your health care provider about your test results, treatment options, and if necessary, the need for more tests. Vaccines  Your health care provider may recommend certain vaccines, such as:  Influenza vaccine. This is recommended every year.  Tetanus, diphtheria, and acellular pertussis (Tdap, Td) vaccine. You may need a Td booster every 10  years.  Zoster vaccine. You may need this after age 60.  Pneumococcal 13-valent conjugate (PCV13) vaccine. One dose is recommended after age 57.  Pneumococcal polysaccharide (PPSV23) vaccine. One dose is recommended after  age 52. Talk to your health care provider about which screenings and vaccines you need and how often you need them. This information is not intended to replace advice given to you by your health care provider. Make sure you discuss any questions you have with your health care provider. Document Released: 02/27/2015 Document Revised: 10/21/2015 Document Reviewed: 12/02/2014 Elsevier Interactive Patient Education  2017 [place] Prevention in the Home Falls can cause injuries. They can happen to people of all ages. There are many things you can do to make your home safe and to help prevent falls. What can I do on the outside of my home?  Regularly fix the edges of walkways and driveways and fix any cracks.  Remove anything that might make you trip as you walk through a door, such as a raised step or threshold.  Trim any bushes or trees on the path to your home.  Use bright outdoor lighting.  Clear any walking paths of anything that might make someone trip, such as rocks or tools.  Regularly check to see if handrails are loose or broken. Make sure that both sides of any steps have handrails.  Any raised decks and porches should have guardrails on the edges.  Have any leaves, snow, or ice cleared regularly.  Use sand or salt on walking paths during winter.  Clean up any spills in your garage right away. This includes oil or grease spills. What can I do in the bathroom?  Use night lights.  Install grab bars by the toilet and in the tub and shower. Do not use towel bars as grab bars.  Use non-skid mats or decals in the tub or shower.  If you need to sit down in the shower, use a plastic, non-slip stool.  Keep the floor dry. Clean up any water that spills on the floor as soon as it happens.  Remove soap buildup in the tub or shower regularly.  Attach bath mats securely with double-sided non-slip rug tape.  Do not have throw rugs and other things on the floor that can  make you trip. What can I do in the bedroom?  Use night lights.  Make sure that you have a light by your bed that is easy to reach.  Do not use any sheets or blankets that are too big for your bed. They should not hang down onto the floor.  Have a firm chair that has side arms. You can use this for support while you get dressed.  Do not have throw rugs and other things on the floor that can make you trip. What can I do in the kitchen?  Clean up any spills right away.  Avoid walking on wet floors.  Keep items that you use a lot in easy-to-reach places.  If you need to reach something above you, use a strong step stool that has a grab bar.  Keep electrical cords out of the way.  Do not use floor polish or wax that makes floors slippery. If you must use wax, use non-skid floor wax.  Do not have throw rugs and other things on the floor that can make you trip. What can I do with my stairs?  Do not leave any items on the stairs.  Make sure that  there are handrails on both sides of the stairs and use them. Fix handrails that are broken or loose. Make sure that handrails are as long as the stairways.  Check any carpeting to make sure that it is firmly attached to the stairs. Fix any carpet that is loose or worn.  Avoid having throw rugs at the top or bottom of the stairs. If you do have throw rugs, attach them to the floor with carpet tape.  Make sure that you have a light switch at the top of the stairs and the bottom of the stairs. If you do not have them, ask someone to add them for you. What else can I do to help prevent falls?  Wear shoes that:  Do not have high heels.  Have rubber bottoms.  Are comfortable and fit you well.  Are closed at the toe. Do not wear sandals.  If you use a stepladder:  Make sure that it is fully opened. Do not climb a closed stepladder.  Make sure that both sides of the stepladder are locked into place.  Ask someone to hold it for you,  if possible.  Clearly mark and make sure that you can see:  Any grab bars or handrails.  First and last steps.  Where the edge of each step is.  Use tools that help you move around (mobility aids) if they are needed. These include:  Canes.  Walkers.  Scooters.  Crutches.  Turn on the lights when you go into a dark area. Replace any light bulbs as soon as they burn out.  Set up your furniture so you have a clear path. Avoid moving your furniture around.  If any of your floors are uneven, fix them.  If there are any pets around you, be aware of where they are.  Review your medicines with your doctor. Some medicines can make you feel dizzy. This can increase your chance of falling. Ask your doctor what other things that you can do to help prevent falls. This information is not intended to replace advice given to you by your health care provider. Make sure you discuss any questions you have with your health care provider. Document Released: 11/27/2008 Document Revised: 07/09/2015 Document Reviewed: 03/07/2014 Elsevier Interactive Patient Education  2017 [employer].

## 2019-05-29 ENCOUNTER — Ambulatory Visit: Payer: Medicare Other

## 2019-05-29 ENCOUNTER — Other Ambulatory Visit: Payer: Self-pay

## 2019-05-29 ENCOUNTER — Other Ambulatory Visit: Payer: Self-pay | Admitting: Cardiology

## 2019-05-29 ENCOUNTER — Ambulatory Visit: Payer: Medicare Other | Admitting: Cardiology

## 2019-05-29 DIAGNOSIS — I6523 Occlusion and stenosis of bilateral carotid arteries: Secondary | ICD-10-CM

## 2019-05-31 NOTE — Progress Notes (Signed)
Patient has been informed of carotid artery duplex results.

## 2019-06-06 DIAGNOSIS — M5416 Radiculopathy, lumbar region: Secondary | ICD-10-CM | POA: Diagnosis not present

## 2019-06-14 ENCOUNTER — Other Ambulatory Visit: Payer: Self-pay | Admitting: Internal Medicine

## 2019-06-18 DIAGNOSIS — M5416 Radiculopathy, lumbar region: Secondary | ICD-10-CM | POA: Diagnosis not present

## 2019-06-25 ENCOUNTER — Other Ambulatory Visit: Payer: Self-pay | Admitting: Physical Medicine and Rehabilitation

## 2019-06-25 DIAGNOSIS — M545 Low back pain, unspecified: Secondary | ICD-10-CM

## 2019-07-01 ENCOUNTER — Telehealth: Payer: Self-pay

## 2019-07-01 ENCOUNTER — Ambulatory Visit: Payer: Self-pay

## 2019-07-01 ENCOUNTER — Other Ambulatory Visit: Payer: Self-pay

## 2019-07-01 DIAGNOSIS — I25118 Atherosclerotic heart disease of native coronary artery with other forms of angina pectoris: Secondary | ICD-10-CM

## 2019-07-01 DIAGNOSIS — N183 Chronic kidney disease, stage 3 unspecified: Secondary | ICD-10-CM

## 2019-07-01 DIAGNOSIS — M4807 Spinal stenosis, lumbosacral region: Secondary | ICD-10-CM

## 2019-07-01 DIAGNOSIS — I1310 Hypertensive heart and chronic kidney disease without heart failure, with stage 1 through stage 4 chronic kidney disease, or unspecified chronic kidney disease: Secondary | ICD-10-CM

## 2019-07-01 DIAGNOSIS — R7303 Prediabetes: Secondary | ICD-10-CM

## 2019-07-02 NOTE — Chronic Care Management (AMB) (Addendum)
  Chronic Care Management   Outreach Note  07/01/2019 Name: Kathryn [name] MRN: [id number] DOB: 1940/11/28  Referred by: Glendale Chard, MD Reason for referral : Chronic Care Management ([facility] RN CM Call )   An unsuccessful telephone outreach was attempted today. The patient was referred to the case management team for assistance with care management and care coordination. Patient not at home, left message with husband, he asked that I call back at a later time.   Follow Up Plan: A HIPPA compliant phone message was left for the patient providing contact information and requesting a return call.  Telephone follow up appointment with care management team member scheduled for: 08/01/19  Barb Merino, RN, BSN, CCM Care Management Coordinator [facility]  Direct Phone: [phone]

## 2019-07-18 DIAGNOSIS — I129 Hypertensive chronic kidney disease with stage 1 through stage 4 chronic kidney disease, or unspecified chronic kidney disease: Secondary | ICD-10-CM | POA: Diagnosis not present

## 2019-07-18 DIAGNOSIS — N183 Chronic kidney disease, stage 3 unspecified: Secondary | ICD-10-CM | POA: Diagnosis not present

## 2019-07-18 DIAGNOSIS — E785 Hyperlipidemia, unspecified: Secondary | ICD-10-CM | POA: Diagnosis not present

## 2019-07-18 DIAGNOSIS — D631 Anemia in chronic kidney disease: Secondary | ICD-10-CM | POA: Diagnosis not present

## 2019-07-18 DIAGNOSIS — N189 Chronic kidney disease, unspecified: Secondary | ICD-10-CM | POA: Diagnosis not present

## 2019-07-18 DIAGNOSIS — I5032 Chronic diastolic (congestive) heart failure: Secondary | ICD-10-CM | POA: Diagnosis not present

## 2019-07-19 NOTE — Progress Notes (Deleted)
Primary Physician/Referring:  Glendale Chard, MD  Patient ID: Kathryn [name], female    DOB: 11-09-1940, 79 y.o.   MRN: [id number]  No chief complaint on file.  HPI:    Kathryn [name]  is a 79 y.o. AAFwith CAD s/p PCI to LCx 2009, hypertension, statin intolerant hyperlipidemia, CKD stage 3 and mild bilateral asymptomatic carotid stenosis. She has had an episode of syncope that occurred in May 2020 suggestive of vasovagal episodes, no recurrence.    Coronary Angiography on 01/16/18 that showed no obstructive CAD and patent stents from 2009. Patient is not on statin due to intolerance, she is enrolled in esperion trial.   ***  She is here for a 3 month OV for AS and hypertension f/u.  Has persistent mild dyspnea. Denies chest pain or palpitations. No leg edema. No PND or orthopnea.  States her BP is well controlled at home.   Past Medical History:  Diagnosis Date  . Anxiety    takes Xanax bid prn  . Bruises easily    pt is on Plavix  . Chronic back pain    buldging disc  . Chronic neck pain   . Coronary artery disease    1 stent   . Dysphagia   . Headache(784.0)    related to cervical issues  . History of colonic polyps   . Hyperlipidemia    takes Crestor daily  . Hypertension    takes Coreg and Diovan daily  . Osteoporosis    was taking shot 2xyr;but not taking anymore  . Peripheral vascular disease ([place])   . Seasonal allergies    takes Zyrtec nightly   Past Surgical History:  Procedure Laterality Date  . ABDOMINAL HYSTERECTOMY  70's  . BUNIONECTOMY     left foot  . CARDIAC CATHETERIZATION  2009/2011   1 stent placed  . CHOLECYSTECTOMY  2009  . COLONOSCOPY    . ESOPHAGOGASTRODUODENOSCOPY    . LEFT HEART CATH AND CORONARY ANGIOGRAPHY N/A 01/16/2018   Procedure: LEFT HEART CATH AND CORONARY ANGIOGRAPHY;  Surgeon: Nigel Mormon, MD;  Location: [facility];  Service: Cardiovascular;  Laterality: N/A;  . TONSILLECTOMY     at age 24   Family  History  Problem Relation Age of Onset  . Heart disease Mother        Heart Dissease before age 87  . Heart attack Mother   . Cancer Father   . Cancer Sister   . Leukemia Sister   . Cancer Brother   . Heart disease Brother        Amputation  . Heart attack Brother   . Heart disease Brother   . Heart attack Brother   . Dementia Other   . Anesthesia problems Neg Hx   . Hypotension Neg Hx   . Malignant hyperthermia Neg Hx   . Pseudochol deficiency Neg Hx    Social History   Tobacco Use  . Smoking status: Former Smoker    Packs/day: 0.25    Years: 5.00    Pack years: 1.25    Quit date: 11/27/1968    Years since quitting: 50.6  . Smokeless tobacco: Never Used  Substance Use Topics  . Alcohol use: No   Marital Status: Married  ROS  Review of Systems  Cardiovascular: Positive for chest pain (left sided post herpetic and chronic). Dyspnea on exertion: stable.  Gastrointestinal: Negative for melena.  All other systems reviewed and are negative.  Objective   Vitals  with BMI 05/23/2019 05/23/2019 04/23/2019  Height 5\' 5"  5\' 5"  5' 4.5"  Weight 216 lbs 3 oz 216 lbs 3 oz 213 lbs 13 oz  BMI 35.98 62.95 28.41  Systolic 324 401 027  Diastolic 64 64 76  Pulse 89 89 78      Physical Exam  Constitutional:  Moderately built and moderately obese in no acute distress.  Neck:  Short neck  Cardiovascular: Normal rate, regular rhythm and intact distal pulses. Exam reveals no gallop.  Murmur heard.  Harsh midsystolic murmur is present with a grade of 3/6 at the upper right sternal border radiating to the neck. Pulses:      Carotid pulses are on the right side with bruit and on the left side with bruit. No edema. No JVD  Pulmonary/Chest: Effort normal and breath sounds normal.  Abdominal: Soft. Bowel sounds are normal.  Obese with mild pannus   Laboratory examination:   Recent Labs    11/15/18 1246 01/22/19 1532 04/23/19 1134  NA 142 140 142  K 4.4 4.7 4.8  CL 104 103 104    CO2 25 22 22   GLUCOSE 83 85 87  BUN 22 29* 17  CREATININE 1.36* 1.33* 1.53*  CALCIUM 9.7 10.2 10.1  GFRNONAA 37* 38* 32*  GFRAA 43* 44* 37*   CrCl cannot be calculated (Patient's most recent lab result is older than the maximum 21 days allowed.).  CMP Latest Ref Rng & Units 04/23/2019 01/22/2019 11/15/2018  Glucose 65 - 99 mg/dL 87 85 83  BUN 8 - 27 mg/dL 17 29(H) 22  Creatinine 0.57 - 1.00 mg/dL 1.53(H) 1.33(H) 1.36(H)  Sodium 134 - 144 mmol/L 142 140 142  Potassium 3.5 - 5.2 mmol/L 4.8 4.7 4.4  Chloride 96 - 106 mmol/L 104 103 104  CO2 20 - 29 mmol/L 22 22 25   Calcium 8.7 - 10.3 mg/dL 10.1 10.2 9.7  Total Protein 6.0 - 8.5 g/dL 7.2 6.9 -  Total Bilirubin 0.0 - 1.2 mg/dL 0.4 0.3 -  Alkaline Phos 39 - 117 IU/L 99 92 -  AST 0 - 40 IU/L 18 20 -  ALT 0 - 32 IU/L 11 14 -   CBC Latest Ref Rng & Units 04/23/2019 01/22/2019 06/20/2018  WBC 3.4 - 10.8 x10E3/uL 8.4 9.3 7.5  Hemoglobin 11.1 - 15.9 g/dL 12.5 11.0(L) 11.0(L)  Hematocrit 34.0 - 46.6 % 40.4 35.8 35.1  Platelets 150 - 450 x10E3/uL 463(H) 405 429   Lipid Panel     Component Value Date/Time   CHOL 185 04/23/2019 1134   TRIG 90 04/23/2019 1134   HDL 54 04/23/2019 1134   CHOLHDL 3.4 04/23/2019 1134   LDLCALC 115 (H) 04/23/2019 1134   HEMOGLOBIN A1C Lab Results  Component Value Date   HGBA1C 5.6 01/22/2019   TSH No results for input(s): TSH in the last 8760 hours.   External Labs: *** Creatinine, Serum 300.000 05/23/2019  TSH 1.720 06/20/2018  Medications and allergies   Allergies  Allergen Reactions  . Codeine Hypertension  . Shellfish Allergy      Current Outpatient Medications  Medication Instructions  . acetaminophen (TYLENOL) 325 mg, Oral, Every 6 hours PRN  . albuterol (VENTOLIN HFA) 108 (90 Base) MCG/ACT inhaler 2 puffs, Inhalation, Every 6 hours PRN  . ALPRAZolam (XANAX) 0.25 mg, Oral, 2 times daily PRN  . amLODipine (NORVASC) 10 mg, Oral, Daily  . CALCIUM PO 1 tablet, Oral, Daily, 600 mg  . carvedilol  (COREG) 6.25 MG tablet TAKE ONE TABLET  BY MOUTH 2 TIMES A DAY  . cetirizine (ZYRTEC) 10 mg, Daily at bedtime  . ferrous sulfate 324 mg, Oral, Every other day  . furosemide (LASIX) 20 MG tablet TAKE 1 TABLET BY MOUTH DAILY  . Investigational - Study Medication 1 tablet, Oral, Daily, Study name:008Additional study details:For cholesterol (Dr. Skip Estimable)  . levocetirizine (XYZAL) 5 mg, Oral, Every evening  . Multiple Vitamin (MULTIVITAMIN WITH MINERALS) TABS 1 tablet, Oral, Daily  . nitroGLYCERIN (NITROSTAT) 0.4 mg, Sublingual, Every 5 min x3 PRN  . pravastatin (PRAVACHOL) 40 MG tablet Take one tablet by mouth once a week.  . pregabalin (LYRICA) 75 mg, Oral, 3 times daily    No orders of the defined types were placed in this encounter.  Radiology:  No results found.   Cardiac Studies:   Sleep Study 2011: Negative for sleep apnea  Coronary Angiogram 01/16/18: Patent Sypher stent in Cx, 2.75 x 15 mm DES placed on 01/22/08. Normal LVEF. NO other significant CAD.  Carotid artery duplex 12/19/2018: Stenosis in the right internal carotid artery (50-69%). Stenosis in the right external carotid artery (<50%). Stenosis in the left internal carotid artery (16-49%). Antegrade right vertebral artery flow. Antegrade left vertebral artery flow. Compared to the study done on 01/01/2018, right ICA stenosis severity has increased from less than 50%. Follow up in six months is appropriate if clinically indicated.  Echocardiogram 12/19/2018: Left ventricle cavity is normal in size. Moderate concentric hypertrophy of the left ventricle. Normal LV systolic function with EF 67%. Normal global wall motion. Doppler evidence of grade I (impaired) diastolic dysfunction, normal LAP.  Left atrial cavity is mildly dilated. Trileaflet aortic valve. Mild aortic valve leaflet calcification. Mild aortic valve stenosis. Aortic valve mean gradient of 10 mmHg, Vmax of 2.1 m/s. Calculated aortic valve area by continuity equation  is 1.2 cm. Moderate (Grade II) aortic regurgitation. Mildly restricted mitral valve leaflets. Moderate (Grade II) mitral regurgitation. Mild tricuspid regurgitation. Estimated pulmonary artery systolic pressure is 39 mmHg.  No significant change compared to previous study on 01/15/2018.  ***Carotid artery duplex 05/29/2019:  Stenosis in the right internal carotid artery (16-49%). Stenosis in the right external carotid artery (<50%).  Stenosis in the left internal carotid artery (50-69%). Stenosis in the left external carotid artery (<50%).  Right vertebral artery flow is not visualized. Antegrade left vertebral artery flow.  Follow up in six months is appropriate if clinically indicated. Overall no significant change from 12/19/2018.  EKG   ***  11/28/2018: Normal sinus rhythm at rate of 75 bpm, left atrial enlargement, normal axis.  Poor R-wave progression, probably normal variant, cannot exclude anteroseptal infarct old.  No evidence of ischemia. Normal QT interval. No significant change from  EKG 02/19/2018    Assessment   No diagnosis found.    Recommendations:   Kathryn [name]  is a 79 y.o. AAFwith CAD s/p PCI to LCx 2009, hypertension, statin intolerant hyperlipidemia, CKD stage 3 and mild bilateral asymptomatic carotid stenosis. She has had an episode of syncope that occurred in May 2020 suggestiv of vasovagal episodes, no recurrence.    Coronary Angiography on 01/16/18 that showed no obstructive CAD and patent stents from 2009. Patient is not on statin due to intolerance, she is enrolled in esperion trial.   ***  On her last office visit I had added amlodipine 5 mg for uncontrolled hypertension, I'll increase it to 10 mg. Patient states her BP is relatively well controlled at home.   I reviewed carotid duplex and echocardiogram  due to prominent aortic stenotic murmur.  Fortunately no significant change in aortic stenosis and carotid artery duplex reveals mild progression of  right carotid disease.  She is on a statin intolerant trial, LDL is now 85, continue observation for now. Lipids have not been actively checked due to patient  enrolled in clinical trial.  I'll see her back in 6 months or sooner if problems.   Adrian Prows, MD, [facility] 07/19/2019, 4:50 PM [facility] Pager: [phone] Office: [phone]

## 2019-07-22 ENCOUNTER — Ambulatory Visit: Payer: Medicare Other | Admitting: Cardiology

## 2019-07-24 ENCOUNTER — Ambulatory Visit
Admission: RE | Admit: 2019-07-24 | Discharge: 2019-07-24 | Disposition: A | Discharge status: Home / Self Care | Payer: Medicare Other | Source: Ambulatory Visit | Attending: Physical Medicine and Rehabilitation | Admitting: Physical Medicine and Rehabilitation

## 2019-07-24 DIAGNOSIS — M48061 Spinal stenosis, lumbar region without neurogenic claudication: Secondary | ICD-10-CM | POA: Diagnosis not present

## 2019-07-24 DIAGNOSIS — M545 Low back pain, unspecified: Secondary | ICD-10-CM

## 2019-07-30 ENCOUNTER — Other Ambulatory Visit (HOSPITAL_COMMUNITY): Payer: Self-pay

## 2019-07-30 NOTE — Discharge Instructions (Signed)

## 2019-07-31 ENCOUNTER — Other Ambulatory Visit: Payer: Self-pay

## 2019-07-31 ENCOUNTER — Other Ambulatory Visit: Payer: Self-pay | Admitting: Internal Medicine

## 2019-07-31 ENCOUNTER — Ambulatory Visit (HOSPITAL_COMMUNITY)
Admission: RE | Admit: 2019-07-31 | Discharge: 2019-07-31 | Disposition: A | Discharge status: Home / Self Care | Payer: Medicare Other | Source: Ambulatory Visit | Attending: Nephrology | Admitting: Nephrology

## 2019-07-31 DIAGNOSIS — N189 Chronic kidney disease, unspecified: Secondary | ICD-10-CM | POA: Diagnosis not present

## 2019-07-31 DIAGNOSIS — D631 Anemia in chronic kidney disease: Secondary | ICD-10-CM | POA: Diagnosis not present

## 2019-07-31 MED ORDER — SODIUM CHLORIDE 0.9 % IV SOLN
510.0000 mg | INTRAVENOUS | Status: DC
  Administered 2019-07-31: 510 mg via INTRAVENOUS
  Filled 2019-07-31: qty 17

## 2019-08-01 ENCOUNTER — Telehealth: Payer: Self-pay

## 2019-08-02 DIAGNOSIS — M48061 Spinal stenosis, lumbar region without neurogenic claudication: Secondary | ICD-10-CM | POA: Diagnosis not present

## 2019-08-06 ENCOUNTER — Other Ambulatory Visit: Payer: Self-pay | Admitting: Cardiology

## 2019-08-06 ENCOUNTER — Other Ambulatory Visit: Payer: Self-pay | Admitting: Internal Medicine

## 2019-08-07 ENCOUNTER — Ambulatory Visit (HOSPITAL_COMMUNITY)
Admission: RE | Admit: 2019-08-07 | Discharge: 2019-08-07 | Disposition: A | Discharge status: Home / Self Care | Payer: Medicare Other | Source: Ambulatory Visit | Attending: Nephrology | Admitting: Nephrology

## 2019-08-07 ENCOUNTER — Encounter (HOSPITAL_COMMUNITY): Payer: Medicare Other

## 2019-08-07 ENCOUNTER — Other Ambulatory Visit: Payer: Self-pay

## 2019-08-07 DIAGNOSIS — N189 Chronic kidney disease, unspecified: Secondary | ICD-10-CM | POA: Diagnosis not present

## 2019-08-07 DIAGNOSIS — D631 Anemia in chronic kidney disease: Secondary | ICD-10-CM | POA: Diagnosis not present

## 2019-08-07 MED ORDER — SODIUM CHLORIDE 0.9 % IV SOLN
510.0000 mg | INTRAVENOUS | Status: DC
  Administered 2019-08-07: 510 mg via INTRAVENOUS
  Filled 2019-08-07: qty 17

## 2019-08-13 ENCOUNTER — Ambulatory Visit (INDEPENDENT_AMBULATORY_CARE_PROVIDER_SITE_OTHER): Payer: Medicare Other | Admitting: Nurse Practitioner

## 2019-08-13 ENCOUNTER — Other Ambulatory Visit: Payer: Self-pay

## 2019-08-13 ENCOUNTER — Encounter: Payer: Self-pay | Admitting: Nurse Practitioner

## 2019-08-13 VITALS — BP 118/80 | HR 80 | Temp 98.3°F | Ht 62.6 in | Wt 220.6 lb

## 2019-08-13 DIAGNOSIS — E7800 Pure hypercholesterolemia, unspecified: Secondary | ICD-10-CM | POA: Diagnosis not present

## 2019-08-13 DIAGNOSIS — R7303 Prediabetes: Secondary | ICD-10-CM | POA: Diagnosis not present

## 2019-08-13 DIAGNOSIS — D509 Iron deficiency anemia, unspecified: Secondary | ICD-10-CM | POA: Diagnosis not present

## 2019-08-13 DIAGNOSIS — I10 Essential (primary) hypertension: Secondary | ICD-10-CM | POA: Diagnosis not present

## 2019-08-13 DIAGNOSIS — N183 Chronic kidney disease, stage 3 unspecified: Secondary | ICD-10-CM | POA: Diagnosis not present

## 2019-08-13 NOTE — Progress Notes (Signed)
This visit occurred during the SARS-CoV-2 public health emergency.  Safety protocols were in place, including screening questions prior to the visit, additional usage of staff PPE, and extensive cleaning of exam room while observing appropriate contact time as indicated for disinfecting solutions.  Subjective:     Patient ID: Kathryn [name] , female    DOB: 24-Jun-1940 , 79 y.o.   MRN: [id number]   Chief Complaint  Patient presents with  . iron check    HPI  Here for recheck iron, she had 2 iron transfusions by Dr. Hollie Salk and was advised to follow up with PCP.  She reports she has chronic fatigue but feels a little better after the infusions. She is to have an MRI of her hip by Dr. Jacelyn Grip, she will be getting an injection to her hip.  She had been riding her stand up bike up until a month ago but was tired.     Past Medical History:  Diagnosis Date  . Anxiety    takes Xanax bid prn  . Bruises easily    pt is on Plavix  . Chronic back pain    buldging disc  . Chronic neck pain   . Coronary artery disease    1 stent   . Dysphagia   . Headache(784.0)    related to cervical issues  . History of colonic polyps   . Hyperlipidemia    takes Crestor daily  . Hypertension    takes Coreg and Diovan daily  . Osteoporosis    was taking shot 2xyr;but not taking anymore  . Peripheral vascular disease ([place])   . Seasonal allergies    takes Zyrtec nightly     Family History  Problem Relation Age of Onset  . Heart disease Mother        Heart Dissease before age 3  . Heart attack Mother   . Cancer Father   . Cancer Sister   . Leukemia Sister   . Cancer Brother   . Heart disease Brother        Amputation  . Heart attack Brother   . Heart disease Brother   . Heart attack Brother   . Dementia Other   . Anesthesia problems Neg Hx   . Hypotension Neg Hx   . Malignant hyperthermia Neg Hx   . Pseudochol deficiency Neg Hx      Current Outpatient Medications:  .  acetaminophen  (TYLENOL) 325 MG tablet, Take 325 mg by mouth every 6 (six) hours as needed for mild pain., Disp: , Rfl:  .  albuterol (VENTOLIN HFA) 108 (90 Base) MCG/ACT inhaler, Inhale 2 puffs into the lungs every 6 (six) hours as needed for wheezing or shortness of breath., Disp: 18 g, Rfl: 1 .  ALPRAZolam (XANAX) 0.25 MG tablet, Take 1 tablet (0.25 mg total) by mouth 2 (two) times daily as needed for anxiety., Disp: 30 tablet, Rfl: 0 .  amLODipine (NORVASC) 10 MG tablet, Take 1 tablet (10 mg total) by mouth daily., Disp: 90 tablet, Rfl: 3 .  CALCIUM PO, Take 1 tablet by mouth daily. 600 mg, Disp: , Rfl:  .  carvedilol (COREG) 6.25 MG tablet, TAKE ONE TABLET BY MOUTH 2 TIMES A DAY, Disp: 90 tablet, Rfl: 0 .  cetirizine (ZYRTEC) 10 MG tablet, Take 10 mg by mouth at bedtime., Disp: , Rfl:  .  ferrous sulfate 324 MG TBEC, Take 324 mg by mouth every other day., Disp: , Rfl:  .  furosemide (LASIX) 20  MG tablet, TAKE 1 TABLET BY MOUTH DAILY, Disp: 90 tablet, Rfl: 0 .  Investigational - Study Medication, Take 1 tablet by mouth daily. Study name:008 Additional study details:For cholesterol (Dr. Skip Estimable), Disp: , Rfl:  .  levocetirizine (XYZAL) 5 MG tablet, Take 5 mg by mouth every evening., Disp: , Rfl:  .  Multiple Vitamin (MULTIVITAMIN WITH MINERALS) TABS, Take 1 tablet by mouth daily., Disp: , Rfl:  .  nitroGLYCERIN (NITROSTAT) 0.4 MG SL tablet, Place 1 tablet (0.4 mg total) under the tongue every 5 (five) minutes x 3 doses as needed for chest pain., Disp: 30 tablet, Rfl: 3 .  pravastatin (PRAVACHOL) 40 MG tablet, TAKE 1 TABLET WEEKLY. (SAME DAY EACH WEEK), Disp: 13 tablet, Rfl: 0 .  pregabalin (LYRICA) 75 MG capsule, Take 1 capsule (75 mg total) by mouth 3 (three) times daily., Disp: 90 capsule, Rfl: 3   Allergies  Allergen Reactions  . Codeine Hypertension  . Shellfish Allergy      Review of Systems  Constitutional: Positive for fatigue.  Respiratory: Negative.   Cardiovascular: Negative.  Negative for chest  pain, palpitations and leg swelling.  Neurological: Negative for dizziness and headaches.  Psychiatric/Behavioral: Negative.      Today's Vitals   08/13/19 1426  BP: 118/80  Pulse: 80  Temp: 98.3 F (36.8 C)  TempSrc: Oral  Weight: 220 lb 9.6 oz (100.1 kg)  Height: 5' 2.6" (1.59 m)  PainSc: 8   PainLoc: Back   Body mass index is 39.58 kg/m.   Objective:  Physical Exam Constitutional:      General: She is not in acute distress.    Appearance: Normal appearance. She is obese.  Cardiovascular:     Rate and Rhythm: Normal rate and regular rhythm.     Pulses: Normal pulses.     Heart sounds: Normal heart sounds. No murmur heard.   Pulmonary:     Effort: Pulmonary effort is normal. No respiratory distress.     Breath sounds: Normal breath sounds.  Skin:    General: Skin is warm and dry.     Capillary Refill: Capillary refill takes less than 2 seconds.     Coloration: Skin is not jaundiced.  Neurological:     General: No focal deficit present.     Mental Status: She is alert and oriented to person, place, and time.     Cranial Nerves: No cranial nerve deficit.  Psychiatric:        Mood and Affect: Mood normal.        Behavior: Behavior normal.        Thought Content: Thought content normal.        Judgment: Judgment normal.         Assessment And Plan:     1. Essential hypertension  Chronic, blood pressure is well controlled  Continue with current medications.   2. Stage 3 chronic kidney disease, unspecified whether stage 3a or 3b CKD  She is being followed by Dr. Hollie Salk who recently ordered for her to have 2 iron transfusions  I will get her medical records and copy of labs to compare results  3. Iron deficiency anemia, unspecified iron deficiency anemia type  Had 2 iron transfusions  Will recheck levels.  - Iron, TIBC and Ferritin Panel  4. Pure hypercholesterolemia  Chronic, stable.  Continue current medications - Lipid panel  5.  Prediabetes  Her most recent HgbA1c have been within normal limits I will not recheck this visit  Kameria Canizares, FNP    THE PATIENT IS ENCOURAGED TO PRACTICE SOCIAL DISTANCING DUE TO THE COVID-19 PANDEMIC.   

## 2019-08-13 NOTE — Patient Instructions (Signed)

## 2019-08-14 LAB — LIPID PANEL
Chol/HDL Ratio: 3 ratio (ref 0.0–4.4)
Cholesterol, Total: 157 mg/dL (ref 100–199)
HDL: 53 mg/dL (ref >39)
LDL Chol Calc (NIH): 87 mg/dL (ref 0–99)
Triglycerides: 91 mg/dL (ref 0–149)
VLDL Cholesterol Cal: 17 mg/dL (ref 5–40)

## 2019-08-14 LAB — IRON,TIBC AND FERRITIN PANEL
Ferritin: 1344 ng/mL — ABNORMAL HIGH (ref 15–150)
Iron Saturation: 41 % (ref 15–55)
Iron: 116 ug/dL (ref 27–139)
Total Iron Binding Capacity: 284 ug/dL (ref 250–450)
UIBC: 168 ug/dL (ref 118–369)

## 2019-08-22 ENCOUNTER — Ambulatory Visit: Payer: Medicare Other | Admitting: Internal Medicine

## 2019-08-23 ENCOUNTER — Ambulatory Visit: Payer: Medicare Other | Admitting: Cardiology

## 2019-08-23 ENCOUNTER — Other Ambulatory Visit: Payer: Self-pay

## 2019-08-23 ENCOUNTER — Encounter: Payer: Self-pay | Admitting: Cardiology

## 2019-08-23 VITALS — BP 140/62 | HR 71 | Resp 16 | Ht 62.0 in | Wt 217.0 lb

## 2019-08-23 DIAGNOSIS — N1832 Chronic kidney disease, stage 3b: Secondary | ICD-10-CM

## 2019-08-23 DIAGNOSIS — E7800 Pure hypercholesterolemia, unspecified: Secondary | ICD-10-CM | POA: Diagnosis not present

## 2019-08-23 DIAGNOSIS — I6523 Occlusion and stenosis of bilateral carotid arteries: Secondary | ICD-10-CM | POA: Diagnosis not present

## 2019-08-23 DIAGNOSIS — I10 Essential (primary) hypertension: Secondary | ICD-10-CM | POA: Diagnosis not present

## 2019-08-23 DIAGNOSIS — I25118 Atherosclerotic heart disease of native coronary artery with other forms of angina pectoris: Secondary | ICD-10-CM

## 2019-08-23 NOTE — Progress Notes (Signed)
Primary Physician/Referring:  Glendale Chard, MD  Patient ID: Kathryn [name], female    DOB: 11/10/1940, 79 y.o.   MRN: [id number]  Chief Complaint  Patient presents with  . Coronary Artery Disease  . Carotid Stenosis  . Hypertension  . Follow-up    6 month   HPI:    Kathryn [name]  is a 79 y.o. AAFwith CAD s/p PCI to LCx 2009, hypertension, statin intolerant hyperlipidemia, CKD stage 3 and mild bilateral asymptomatic carotid stenosis. She has had an episode of syncope that occurred in May 2020 suggestive of vasovagal episodes, no recurrence.    Coronary Angiography on 01/16/18 that showed no obstructive CAD and patent stents from 2009. Patient is not on statin due to intolerance, she is enrolled in esperion trial.   She is here for a 6 month OV for AS and hypertension f/u.  Has persistent mild dyspnea. Denies chest pain or palpitations. No leg edema. No PND or orthopnea.  Except for severe back pain, she has no other specific complaints today.  States that it has been life style limiting due to severe back pain.  States that the blood pressure has been well controlled and she is tolerating all her medications well.  Past Medical History:  Diagnosis Date  . Anxiety    takes Xanax bid prn  . Bruises easily    pt is on Plavix  . Chronic back pain    buldging disc  . Chronic neck pain   . Coronary artery disease    1 stent   . Dysphagia   . Headache(784.0)    related to cervical issues  . History of colonic polyps   . Hyperlipidemia    takes Crestor daily  . Hypertension    takes Coreg and Diovan daily  . Osteoporosis    was taking shot 2xyr;but not taking anymore  . Peripheral vascular disease ([place])   . Seasonal allergies    takes Zyrtec nightly   Past Surgical History:  Procedure Laterality Date  . ABDOMINAL HYSTERECTOMY  70's  . BUNIONECTOMY     left foot  . CARDIAC CATHETERIZATION  2009/2011   1 stent placed  . CHOLECYSTECTOMY  2009  . COLONOSCOPY    .  ESOPHAGOGASTRODUODENOSCOPY    . LEFT HEART CATH AND CORONARY ANGIOGRAPHY N/A 01/16/2018   Procedure: LEFT HEART CATH AND CORONARY ANGIOGRAPHY;  Surgeon: Nigel Mormon, MD;  Location: [facility];  Service: Cardiovascular;  Laterality: N/A;  . TONSILLECTOMY     at age 79   Social History   Socioeconomic History  . Marital status: Married    Spouse name: Not on file  . Number of children: 4  . Years of education: Not on file  . Highest education level: Not on file  Occupational History  . Occupation: retired  Tobacco Use  . Smoking status: Former Smoker    Packs/day: 0.25    Years: 5.00    Pack years: 1.25    Quit date: 11/27/1968    Years since quitting: 50.7  . Smokeless tobacco: Never Used  Vaping Use  . Vaping Use: Never used  Substance and Sexual Activity  . Alcohol use: No  . Drug use: No  . Sexual activity: Not Currently  Other Topics Concern  . Not on file  Social History Narrative  . Not on file   Social Determinants of Health   Financial Resource Strain: Low Risk   . Difficulty of Paying Living Expenses: Not  hard at all  Food Insecurity: No Food Insecurity  . Worried About Charity fundraiser in the Last Year: Never true  . Ran Out of Food in the Last Year: Never true  Transportation Needs: No Transportation Needs  . Lack of Transportation (Medical): No  . Lack of Transportation (Non-Medical): No  Physical Activity: Insufficiently Active  . Days of Exercise per Week: 7 days  . Minutes of Exercise per Session: 20 min  Stress: No Stress Concern Present  . Feeling of Stress : Not at all  Social Connections:   . Frequency of Communication with Friends and Family:   . Frequency of Social Gatherings with Friends and Family:   . Attends Religious Services:   . Active Member of Clubs or Organizations:   . Attends Archivist Meetings:   [name] Marital Status:   Intimate Partner Violence:   . Fear of Current or Ex-Partner:   . Emotionally  Abused:   [name] Physically Abused:   . Sexually Abused:    ROS  Review of Systems  Constitutional: Negative for chills, decreased appetite, malaise/fatigue and weight gain.  Cardiovascular: Positive for chest pain (left sided post herpetic and chronic) and dyspnea on exertion (stable). Negative for leg swelling and syncope.  Endocrine: Negative for cold intolerance.  Hematologic/Lymphatic: Does not bruise/bleed easily.  Musculoskeletal: Negative for back pain and joint swelling.  Gastrointestinal: Negative for abdominal pain, anorexia, change in bowel habit, melena and nausea.  Neurological: Negative for headaches and light-headedness.  Psychiatric/Behavioral: Negative for depression and substance abuse.  All other systems reviewed and are negative.  Objective   Vitals with BMI 08/23/2019 08/13/2019 08/07/2019  Height 5\' 2"  5' 2.6" -  Weight 217 lbs 220 lbs 10 oz -  BMI 69.67 89.38 -  Systolic 101 751 025  Diastolic 62 80 49  Pulse 71 80 59      Physical Exam Constitutional:      Comments: Moderately built and moderately obese in no acute distress.  HENT:     Head: Atraumatic.  Eyes:     Conjunctiva/sclera: Conjunctivae normal.  Neck:     Thyroid: No thyromegaly.     Comments: Short neck Cardiovascular:     Rate and Rhythm: Normal rate and regular rhythm.     Pulses: Intact distal pulses.          Carotid pulses are on the right side with bruit and on the left side with bruit.    Heart sounds: Murmur heard.  Harsh midsystolic murmur is present with a grade of 3/6 at the upper right sternal border radiating to the neck.  No gallop.      Comments: No edema. No JVD Pulmonary:     Effort: Pulmonary effort is normal.     Breath sounds: Normal breath sounds.  Abdominal:     General: Bowel sounds are normal.     Palpations: Abdomen is soft.     Comments: Obese with mild pannus  Musculoskeletal:        General: Normal range of motion.     Cervical back: Neck supple.  Skin:     General: Skin is warm and dry.  Neurological:     Mental Status: She is alert and oriented to person, place, and time.    Laboratory examination:    Recent Labs    11/15/18 1246 01/22/19 1532 04/23/19 1134  NA 142 140 142  K 4.4 4.7 4.8  CL 104 103 104  CO2 25 22  22  GLUCOSE 83 85 87  BUN 22 29* 17  CREATININE 1.36* 1.33* 1.53*  CALCIUM 9.7 10.2 10.1  GFRNONAA 37* 38* 32*  GFRAA 43* 44* 37*   CrCl cannot be calculated (Patient's most recent lab result is older than the maximum 21 days allowed.).  CMP Latest Ref Rng & Units 04/23/2019 01/22/2019 11/15/2018  Glucose 65 - 99 mg/dL 87 85 83  BUN 8 - 27 mg/dL 17 29(H) 22  Creatinine 0.57 - 1.00 mg/dL 1.53(H) 1.33(H) 1.36(H)  Sodium 134 - 144 mmol/L 142 140 142  Potassium 3.5 - 5.2 mmol/L 4.8 4.7 4.4  Chloride 96 - 106 mmol/L 104 103 104  CO2 20 - 29 mmol/L 22 22 25   Calcium 8.7 - 10.3 mg/dL 10.1 10.2 9.7  Total Protein 6.0 - 8.5 g/dL 7.2 6.9 -  Total Bilirubin 0.0 - 1.2 mg/dL 0.4 0.3 -  Alkaline Phos 39 - 117 IU/L 99 92 -  AST 0 - 40 IU/L 18 20 -  ALT 0 - 32 IU/L 11 14 -   CBC Latest Ref Rng & Units 04/23/2019 01/22/2019 06/20/2018  WBC 3.4 - 10.8 x10E3/uL 8.4 9.3 7.5  Hemoglobin 11.1 - 15.9 g/dL 12.5 11.0(L) 11.0(L)  Hematocrit 34.0 - 46.6 % 40.4 35.8 35.1  Platelets 150 - 450 x10E3/uL 463(H) 405 429   Lipid Panel     Component Value Date/Time   CHOL 157 08/13/2019 1458   TRIG 91 08/13/2019 1458   HDL 53 08/13/2019 1458   CHOLHDL 3.0 08/13/2019 1458   LDLCALC 87 08/13/2019 1458   HEMOGLOBIN A1C Lab Results  Component Value Date   HGBA1C 5.6 01/22/2019   TSH No results for input(s): TSH in the last 8760 hours. Medications and allergies   Allergies  Allergen Reactions  . Codeine Hypertension  . Shellfish Allergy      Current Outpatient Medications  Medication Instructions  . acetaminophen (TYLENOL) 325 mg, Oral, Every 6 hours PRN  . albuterol (VENTOLIN HFA) 108 (90 Base) MCG/ACT inhaler 2 puffs, Inhalation,  Every 6 hours PRN  . ALPRAZolam (XANAX) 0.25 mg, Oral, 2 times daily PRN  . amLODipine (NORVASC) 10 mg, Oral, Daily  . CALCIUM PO 1 tablet, Oral, Daily, 600 mg  . carvedilol (COREG) 6.25 MG tablet TAKE ONE TABLET BY MOUTH 2 TIMES A DAY  . cetirizine (ZYRTEC) 10 mg, Daily at bedtime  . ferrous sulfate 324 mg, Oral, Every other day  . furosemide (LASIX) 20 MG tablet TAKE 1 TABLET BY MOUTH DAILY  . Investigational - Study Medication 1 tablet, Oral, Daily, Study name:008Additional study details:For cholesterol (Dr. Skip Estimable)  . levocetirizine (XYZAL) 5 mg, Oral, Every evening  . Multiple Vitamin (MULTIVITAMIN WITH MINERALS) TABS 1 tablet, Oral, Daily  . nitroGLYCERIN (NITROSTAT) 0.4 mg, Sublingual, Every 5 min x3 PRN  . pravastatin (PRAVACHOL) 40 MG tablet TAKE 1 TABLET WEEKLY. (SAME DAY EACH WEEK)  . pregabalin (LYRICA) 75 mg, Oral, 3 times daily    Radiology:  No results found. Cardiac Studies:   Sleep Study  [2011]: Negative for sleep apnea  Coronary Angiogram   01/16/18: Patent Sypher stent in Cx, 2.75 x 15 mm DES placed on 01/22/08. Normal LVEF. NO other significant CAD.  Echocardiogram 12/19/2018: Left ventricle cavity is normal in size. Moderate concentric hypertrophy of the left ventricle. Normal LV systolic function with EF 67%. Normal global wall motion. Doppler evidence of grade I (impaired) diastolic dysfunction, normal LAP.  Left atrial cavity is mildly dilated. Trileaflet aortic valve. Mild aortic  valve leaflet calcification. Mild aortic valve stenosis. Aortic valve mean gradient of 10 mmHg, Vmax of 2.1 m/s. Calculated aortic valve area by continuity equation is 1.2 cm. Moderate (Grade II) aortic regurgitation. Mildly restricted mitral valve leaflets. Moderate (Grade II) mitral regurgitation. Mild tricuspid regurgitation. Estimated pulmonary artery systolic pressure is 39 mmHg.  No significant change compared to previous study on 01/15/2018.  Carotid artery duplex 05/29/2019:   Stenosis in the right internal carotid artery (16-49%). Stenosis in the right external carotid artery (<50%).  Stenosis in the left internal carotid artery (50-69%). Stenosis in the left external carotid artery (<50%).  Right vertebral artery flow is not visualized. Antegrade left vertebral artery flow.  Follow up in six months is appropriate if clinically indicated. Overall no significant change from 12/19/2018.  EKG:  EKG 08/23/2019: Normal sinus rhythm with rate of 75 bpm, normal axis.  No evidence of ischemia, normal EKG.    Assessment     ICD-10-CM   1. Coronary artery disease of native artery of native heart with stable angina pectoris ([place])  I25.118 EKG 12-Lead  2. Essential hypertension  I10   3. Carotid stenosis, asymptomatic, bilateral  I65.23   4. Pure hypercholesterolemia  E78.00   5. Stage 3b chronic kidney disease  N18.32      EKG 11/28/2018: Normal sinus rhythm at rate of 75 bpm, left atrial enlargement, normal axis.  Poor R-wave progression, probably normal variant, cannot exclude anteroseptal infarct old.  No evidence of ischemia. Normal QT interval. No significant change from  EKG 02/19/2018    Recommendations:   No orders of the defined types were placed in this encounter.   Kathryn [name]  is a 79 y.o. AAFwith CAD s/p PCI to LCx 2009, hypertension, statin intolerant hyperlipidemia, CKD stage 3 and mild bilateral asymptomatic carotid stenosis. She has had an episode of syncope that occurred in May 2020 suggestiv of vasovagal episodes, no recurrence.    Coronary Angiography on 01/16/18 that showed no obstructive CAD and patent stents from 2009. Patient is not on statin due to intolerance, she is enrolled in esperion trial.   She presently doing well except for chronic back pain and states that she has had a slipped disc.  I reviewed her EKG, no change and no ischemia, blood pressures well controlled, lipids are also well controlled.  I reviewed her external labs.   Renal function has remained stable at stage III chronic kidney disease.  I discussed the findings of the carotid artery duplex.  We will continue surveillance for now.  No changes in the medications were done today.  I will see her back on annual basis.   Adrian Prows, MD, [facility] 08/23/2019, 12:25 PM Office: [phone]

## 2019-08-27 ENCOUNTER — Other Ambulatory Visit: Payer: Self-pay | Admitting: Nurse Practitioner

## 2019-08-27 DIAGNOSIS — M48061 Spinal stenosis, lumbar region without neurogenic claudication: Secondary | ICD-10-CM | POA: Diagnosis not present

## 2019-08-27 DIAGNOSIS — B0229 Other postherpetic nervous system involvement: Secondary | ICD-10-CM

## 2019-08-27 NOTE — Telephone Encounter (Signed)
Lyrica refill

## 2019-09-03 ENCOUNTER — Telehealth: Payer: Self-pay

## 2019-09-03 NOTE — Telephone Encounter (Signed)
The pt called and left a message that she has been having bad cramps in her legs and her feet, the cramps are bad at night but that they hurt during the day too and that they make her feet turn when her feet are cramping. The pt said that she has tried tonic water and eating mustard. The pt was notified that Dr Baird Cancer said to take some magnesium over the counter.

## 2019-09-06 ENCOUNTER — Telehealth: Payer: Self-pay

## 2019-09-10 DIAGNOSIS — M25562 Pain in left knee: Secondary | ICD-10-CM | POA: Diagnosis not present

## 2019-09-10 DIAGNOSIS — M25561 Pain in right knee: Secondary | ICD-10-CM | POA: Diagnosis not present

## 2019-09-28 ENCOUNTER — Other Ambulatory Visit: Payer: Self-pay | Admitting: Nurse Practitioner

## 2019-09-28 DIAGNOSIS — B0229 Other postherpetic nervous system involvement: Secondary | ICD-10-CM

## 2019-10-01 NOTE — Telephone Encounter (Signed)
Pregabalin refill

## 2019-10-24 ENCOUNTER — Telehealth: Payer: Self-pay

## 2019-10-24 ENCOUNTER — Other Ambulatory Visit: Payer: Self-pay

## 2019-10-31 ENCOUNTER — Other Ambulatory Visit: Payer: Self-pay | Admitting: Nurse Practitioner

## 2019-10-31 ENCOUNTER — Other Ambulatory Visit: Payer: Self-pay | Admitting: Internal Medicine

## 2019-10-31 DIAGNOSIS — B0229 Other postherpetic nervous system involvement: Secondary | ICD-10-CM

## 2019-10-31 NOTE — Telephone Encounter (Signed)
Lyrica refill

## 2019-11-09 ENCOUNTER — Other Ambulatory Visit: Payer: Self-pay | Admitting: Internal Medicine

## 2019-11-09 ENCOUNTER — Other Ambulatory Visit: Payer: Self-pay | Admitting: Cardiology

## 2019-11-09 DIAGNOSIS — I10 Essential (primary) hypertension: Secondary | ICD-10-CM

## 2019-11-11 ENCOUNTER — Other Ambulatory Visit (HOSPITAL_COMMUNITY): Payer: Self-pay | Admitting: Internal Medicine

## 2019-11-11 DIAGNOSIS — Z1231 Encounter for screening mammogram for malignant neoplasm of breast: Secondary | ICD-10-CM

## 2019-11-13 ENCOUNTER — Ambulatory Visit (HOSPITAL_COMMUNITY)
Admission: RE | Admit: 2019-11-13 | Discharge: 2019-11-13 | Disposition: A | Discharge status: Home / Self Care | Payer: Medicare Other | Source: Ambulatory Visit | Attending: Internal Medicine | Admitting: Internal Medicine

## 2019-11-13 ENCOUNTER — Other Ambulatory Visit: Payer: Self-pay

## 2019-11-13 DIAGNOSIS — Z1231 Encounter for screening mammogram for malignant neoplasm of breast: Secondary | ICD-10-CM | POA: Diagnosis not present

## 2019-11-14 DIAGNOSIS — M48061 Spinal stenosis, lumbar region without neurogenic claudication: Secondary | ICD-10-CM | POA: Diagnosis not present

## 2019-11-26 ENCOUNTER — Ambulatory Visit: Payer: Medicare Other

## 2019-11-26 ENCOUNTER — Other Ambulatory Visit: Payer: Self-pay

## 2019-11-26 DIAGNOSIS — M533 Sacrococcygeal disorders, not elsewhere classified: Secondary | ICD-10-CM | POA: Diagnosis not present

## 2019-11-26 DIAGNOSIS — I6523 Occlusion and stenosis of bilateral carotid arteries: Secondary | ICD-10-CM

## 2019-11-27 ENCOUNTER — Ambulatory Visit (INDEPENDENT_AMBULATORY_CARE_PROVIDER_SITE_OTHER): Payer: Medicare Other | Admitting: Internal Medicine

## 2019-11-27 ENCOUNTER — Encounter: Payer: Self-pay | Admitting: Internal Medicine

## 2019-11-27 VITALS — BP 134/72 | HR 80 | Temp 98.1°F | Ht 62.0 in | Wt 219.4 lb

## 2019-11-27 DIAGNOSIS — R252 Cramp and spasm: Secondary | ICD-10-CM

## 2019-11-27 DIAGNOSIS — M533 Sacrococcygeal disorders, not elsewhere classified: Secondary | ICD-10-CM | POA: Diagnosis not present

## 2019-11-27 DIAGNOSIS — N183 Chronic kidney disease, stage 3 unspecified: Secondary | ICD-10-CM | POA: Diagnosis not present

## 2019-11-27 DIAGNOSIS — M4807 Spinal stenosis, lumbosacral region: Secondary | ICD-10-CM

## 2019-11-27 DIAGNOSIS — R7303 Prediabetes: Secondary | ICD-10-CM | POA: Diagnosis not present

## 2019-11-27 DIAGNOSIS — I1310 Hypertensive heart and chronic kidney disease without heart failure, with stage 1 through stage 4 chronic kidney disease, or unspecified chronic kidney disease: Secondary | ICD-10-CM

## 2019-11-27 DIAGNOSIS — E66813 Obesity, class 3: Secondary | ICD-10-CM

## 2019-11-27 DIAGNOSIS — Z6841 Body Mass Index (BMI) 40.0 and over, adult: Secondary | ICD-10-CM

## 2019-11-27 MED ORDER — ALPRAZOLAM 0.25 MG PO TABS: 0.2500 mg | ORAL_TABLET | Freq: Two times a day (BID) | ORAL | 0 refills | Status: DC | PRN

## 2019-11-27 MED ORDER — TRIAMCINOLONE ACETONIDE 0.1 % EX CREA: 1.0000 "application " | TOPICAL_CREAM | Freq: Two times a day (BID) | CUTANEOUS | 0 refills | Status: DC

## 2019-11-27 NOTE — Patient Instructions (Signed)

## 2019-11-27 NOTE — Progress Notes (Signed)
I,Tianna Badgett,acting as a Education administrator for [name], MD.,have documented all relevant documentation on the behalf of [name], MD,as directed by  [name], MD while in the presence of [name], MD.  This visit occurred during the SARS-CoV-2 public health emergency.  Safety protocols were in place, including screening questions prior to the visit, additional usage of staff PPE, and extensive cleaning of exam room while observing appropriate contact time as indicated for disinfecting solutions.  Subjective:     Patient ID: Kathryn [name] , female    DOB: 1941/01/27 , 79 y.o.   MRN: [id number]   Chief Complaint  Patient presents with  . Hypertension    HPI  She presents for BP check. She reports compliance with meds.  She would like to discuss a discolored area on her elbow.   Hypertension This is a chronic problem. The current episode started more than 1 year ago. The problem has been gradually improving since onset. The problem is controlled. Pertinent negatives include no blurred vision, chest pain, palpitations or shortness of breath. Risk factors for coronary artery disease include dyslipidemia, obesity, post-menopausal state and sedentary lifestyle. The current treatment provides moderate improvement. Compliance problems include exercise.  Hypertensive end-organ damage includes kidney disease and CAD/MI.     Past Medical History:  Diagnosis Date  . Anxiety    takes Xanax bid prn  . Bruises easily    pt is on Plavix  . Chronic back pain    buldging disc  . Chronic neck pain   . Coronary artery disease    1 stent   . Dysphagia   . Headache(784.0)    related to cervical issues  . History of colonic polyps   . Hyperlipidemia    takes Crestor daily  . Hypertension    takes Coreg and Diovan daily  . Osteoporosis    was taking shot 2xyr;but not taking anymore  . Peripheral vascular disease ([place])   . Seasonal allergies    takes Zyrtec nightly      Family History  Problem Relation Age of Onset  . Heart disease Mother        Heart Dissease before age 71  . Heart attack Mother   . Cancer Father   . Cancer Sister   . Leukemia Sister   . Cancer Brother   . Heart disease Brother        Amputation  . Heart attack Brother   . Heart disease Brother   . Heart attack Brother   . Dementia Other   . Anesthesia problems Neg Hx   . Hypotension Neg Hx   . Malignant hyperthermia Neg Hx   . Pseudochol deficiency Neg Hx      Current Outpatient Medications:  .  acetaminophen (TYLENOL) 325 MG tablet, Take 325 mg by mouth every 6 (six) hours as needed for mild pain., Disp: , Rfl:  .  ALPRAZolam (XANAX) 0.25 MG tablet, Take 1 tablet (0.25 mg total) by mouth 2 (two) times daily as needed for anxiety., Disp: 30 tablet, Rfl: 0 .  amLODipine (NORVASC) 10 MG tablet, TAKE ONE TABLET BY MOUTH ONCE DAILY., Disp: 90 tablet, Rfl: 0 .  CALCIUM PO, Take 1 tablet by mouth daily. 600 mg, Disp: , Rfl:  .  carvedilol (COREG) 6.25 MG tablet, TAKE ONE TABLET BY MOUTH 2 TIMES A DAY, Disp: 90 tablet, Rfl: 0 .  cetirizine (ZYRTEC) 10 MG tablet, Take 10 mg by mouth at bedtime., Disp: ,  Rfl:  .  ferrous sulfate 324 MG TBEC, Take 324 mg by mouth every other day., Disp: , Rfl:  .  furosemide (LASIX) 20 MG tablet, TAKE 1 TABLET BY MOUTH DAILY, Disp: 90 tablet, Rfl: 0 .  Investigational - Study Medication, Take 1 tablet by mouth daily. Study name:008 Additional study details:For cholesterol (Dr. Voncille Lo), Disp: , Rfl:  .  levocetirizine (XYZAL) 5 MG tablet, Take 5 mg by mouth every evening., Disp: , Rfl:  .  Multiple Vitamin (MULTIVITAMIN WITH MINERALS) TABS, Take 1 tablet by mouth daily., Disp: , Rfl:  .  nitroGLYCERIN (NITROSTAT) 0.4 MG SL tablet, Place 1 tablet (0.4 mg total) under the tongue every 5 (five) minutes x 3 doses as needed for chest pain., Disp: 30 tablet, Rfl: 3 .  pravastatin (PRAVACHOL) 40 MG tablet, TAKE 1 TABLET WEEKLY. (SAME DAY EACH WEEK), Disp: 13  tablet, Rfl: 0 .  pregabalin (LYRICA) 75 MG capsule, TAKE 1 CAPSULE BY MOUTH THREE TIMES A DAY., Disp: 90 capsule, Rfl: 4 .  triamcinolone cream (KENALOG) 0.1 %, Apply 1 application topically 2 (two) times daily., Disp: 30 g, Rfl: 0   Allergies  Allergen Reactions  . Codeine Hypertension  . Shellfish Allergy      Review of Systems  Constitutional: Negative.   Eyes: Negative for blurred vision.  Respiratory: Negative.  Negative for shortness of breath.   Cardiovascular: Negative.  Negative for chest pain and palpitations.  Gastrointestinal: Negative.   Musculoskeletal: Positive for back pain and myalgias.       She c/o b/l leg cramps. Usually occur at night. Unable to identify any other triggers.   Skin: Positive for color change.  Neurological: Negative.      Today's Vitals   11/27/19 1152  BP: 134/72  Pulse: 80  Temp: 98.1 F (36.7 C)  TempSrc: Oral  Weight: 219 lb 6.4 oz (99.5 kg)  Height: 5\' 2"  (1.575 m)   Body mass index is 40.13 kg/m.   Objective:  Physical Exam Vitals and nursing note reviewed.  Constitutional:      Appearance: Normal appearance. She is obese.  HENT:     Head: Normocephalic and atraumatic.  Cardiovascular:     Rate and Rhythm: Normal rate and regular rhythm.     Heart sounds: Normal heart sounds.  Pulmonary:     Effort: Pulmonary effort is normal.     Breath sounds: Normal breath sounds.  Skin:    General: Skin is warm.     Comments: Area of hypopigmentation on right forearm. No vesicular lesions noted. No erythema noted. Skin is dry  Neurological:     General: No focal deficit present.     Mental Status: She is alert.  Psychiatric:        Mood and Affect: Mood normal.        Behavior: Behavior normal.         Assessment And Plan:     1. Hypertensive heart and renal disease with renal failure, stage 1 through stage 4 or unspecified chronic kidney disease, without heart failure Comments: Chronic, fair control. She is aware that BP  goal is less than 130/80. Encouraged to avoid adding salt to her foods. Also encouraged to increase exercise.  - BMP8+EGFR  2. Stage 3 chronic kidney disease, unspecified whether stage 3a or 3b CKD (HCC) Comments: Chronic, encouraged to stay well hydrated, keep BP controlled to avoid progression of CKD.  3. Prediabetes Comments: Chronic, I will check hba1c today. Encouraged to avoid  sugary beverages.  - Hemoglobin A1c - Insulin, random(561)  4. Spinal stenosis of lumbosacral region Comments: Chronic. She was seen by Dr. Mina Marble earlier today, received "spot therapy" today. Hopefully, she will get relief with this treatment.  5. Leg cramps Comments: Intermittent. Advised to start magnesium supplementation, starting 400mg  nightly.  She is aware that she may get this over the counter.  - Magnesium  6. Class 3 severe obesity due to excess calories with serious comorbidity and body mass index (BMI) of 40.0 to 44.9 in adult [facility]) Comments: BMI 40. Again, importance  of regular exercise was discussed wiht the patient. Encouraged to strive to lose 10-15 pounds prior to her next visit.   Wt Readings from Last 3 Encounters:  12/03/19 220 lb (99.8 kg)  11/27/19 219 lb 6.4 oz (99.5 kg)  08/23/19 217 lb (98.4 kg)      Patient was given opportunity to ask questions. Patient verbalized understanding of the plan and was able to repeat key elements of the plan. All questions were answered to their satisfaction.  [name], MD   I, [name], MD, have reviewed all documentation for this visit. The documentation on 12/06/19 for the exam, diagnosis, procedures, and orders are all accurate and complete.  THE PATIENT IS ENCOURAGED TO PRACTICE SOCIAL DISTANCING DUE TO THE COVID-19 PANDEMIC.

## 2019-11-29 ENCOUNTER — Other Ambulatory Visit: Payer: Medicare Other

## 2019-12-01 ENCOUNTER — Other Ambulatory Visit: Payer: Self-pay | Admitting: Cardiology

## 2019-12-01 DIAGNOSIS — I6523 Occlusion and stenosis of bilateral carotid arteries: Secondary | ICD-10-CM

## 2019-12-02 ENCOUNTER — Telehealth: Payer: Self-pay

## 2019-12-02 NOTE — Telephone Encounter (Cosign Needed)
  Chronic Care Management   Outreach Note  12/02/2019 Name: Kathryn [name] MRN: [id number] DOB: [birth year]  Referred by: Glendale Chard, MD Reason for referral : Chronic Care Management (CCM RNCM F/U Call )   An unsuccessful telephone outreach was attempted today. The patient was referred to the case management team for assistance with care management and care coordination.   Follow Up Plan: A HIPAA compliant phone message was left for the patient providing contact information and requesting a return call.  Telephone follow up appointment with care management team member scheduled for: 01/06/20  Barb Merino, RN, BSN, CCM Care Management Coordinator [facility]  Direct Phone: [phone]

## 2019-12-03 ENCOUNTER — Other Ambulatory Visit: Payer: Self-pay

## 2019-12-03 ENCOUNTER — Ambulatory Visit (INDEPENDENT_AMBULATORY_CARE_PROVIDER_SITE_OTHER): Payer: Medicare Other

## 2019-12-03 VITALS — BP 120/66 | HR 68 | Temp 98.3°F | Ht 62.0 in | Wt 220.0 lb

## 2019-12-03 DIAGNOSIS — Z23 Encounter for immunization: Secondary | ICD-10-CM | POA: Diagnosis not present

## 2019-12-03 NOTE — Progress Notes (Signed)
Relayed information to patient. Patient voiced understanding.  

## 2019-12-03 NOTE — Progress Notes (Signed)
Pt here today for flu shot

## 2019-12-20 DIAGNOSIS — M533 Sacrococcygeal disorders, not elsewhere classified: Secondary | ICD-10-CM | POA: Diagnosis not present

## 2019-12-25 ENCOUNTER — Other Ambulatory Visit: Payer: Self-pay | Admitting: Internal Medicine

## 2019-12-27 ENCOUNTER — Telehealth: Payer: Self-pay

## 2019-12-27 NOTE — Telephone Encounter (Signed)
The pt was notified that she didn't have her lab work done at her last visit and the pt said she would come back to the office next week.

## 2019-12-30 DIAGNOSIS — R7303 Prediabetes: Secondary | ICD-10-CM | POA: Diagnosis not present

## 2019-12-30 DIAGNOSIS — R252 Cramp and spasm: Secondary | ICD-10-CM | POA: Diagnosis not present

## 2019-12-30 DIAGNOSIS — I1310 Hypertensive heart and chronic kidney disease without heart failure, with stage 1 through stage 4 chronic kidney disease, or unspecified chronic kidney disease: Secondary | ICD-10-CM | POA: Diagnosis not present

## 2019-12-31 LAB — HEMOGLOBIN A1C
Est. average glucose Bld gHb Est-mCnc: 114 mg/dL
Hgb A1c MFr Bld: 5.6 % (ref 4.8–5.6)

## 2019-12-31 LAB — INSULIN, RANDOM: INSULIN: 19.7 u[IU]/mL (ref 2.6–24.9)

## 2019-12-31 LAB — BMP8+EGFR
BUN/Creatinine Ratio: 16 (ref 12–28)
BUN: 20 mg/dL (ref 8–27)
CO2: 24 mmol/L (ref 20–29)
Calcium: 9.5 mg/dL (ref 8.7–10.3)
Chloride: 104 mmol/L (ref 96–106)
Creatinine, Ser: 1.28 mg/dL — ABNORMAL HIGH (ref 0.57–1.00)
GFR calc Af Amer: 46 mL/min/{1.73_m2} — ABNORMAL LOW (ref >59)
GFR calc non Af Amer: 40 mL/min/{1.73_m2} — ABNORMAL LOW (ref >59)
Glucose: 87 mg/dL (ref 65–99)
Potassium: 4.6 mmol/L (ref 3.5–5.2)
Sodium: 144 mmol/L (ref 134–144)

## 2019-12-31 LAB — MAGNESIUM: Magnesium: 2.3 mg/dL (ref 1.6–2.3)

## 2020-01-04 ENCOUNTER — Encounter: Payer: Self-pay | Admitting: Emergency Medicine

## 2020-01-04 ENCOUNTER — Ambulatory Visit
Admission: EM | Admit: 2020-01-04 | Discharge: 2020-01-04 | Disposition: A | Discharge status: Home / Self Care | Payer: Medicare Other | Attending: Emergency Medicine | Admitting: Emergency Medicine

## 2020-01-04 DIAGNOSIS — B029 Zoster without complications: Secondary | ICD-10-CM | POA: Diagnosis not present

## 2020-01-04 MED ORDER — PREDNISONE 10 MG PO TABS
20.0000 mg | ORAL_TABLET | Freq: Every day | ORAL | 0 refills | Status: DC
Start: 2020-01-04 — End: 2020-02-13

## 2020-01-04 MED ORDER — VALACYCLOVIR HCL 1 G PO TABS
1000.0000 mg | ORAL_TABLET | Freq: Three times a day (TID) | ORAL | 0 refills | Status: DC
Start: 2020-01-04 — End: 2021-05-19

## 2020-01-04 NOTE — Discharge Instructions (Addendum)
Rest and use ice/heat as needed for symptomatic relief Prescribed valacyclovir 1000mg  3x/day for 7 days Prescribed prednisone taper for inflammation and pain Use OTC medications such as ibuprofen/ tylenol.  Use tramadol as needed for break-through pain Follow up with PCP in 7-10 days if rash is still present Follow up with PCP if symptoms of burning, stinging, tingling or numbness occur after rash resolves, you may need additional treatment Return here or go to ER if you have any new or worsening symptoms (such as eye involvement, severe pain, or signs of secondary infection such as fever, chills, nausea, vomiting, discharge, redness or warmth over site of rash)

## 2020-01-04 NOTE — ED Triage Notes (Signed)
Red blisters that burn and itch on the back of RT side of neck and RT shoulder that came up x2 days.

## 2020-01-04 NOTE — ED Provider Notes (Addendum)
[place]   [id number] 01/04/20 Arrival Time: 8144   Chief Complaint  Patient presents with  . Herpes Zoster    SUBJECTIVE: History from: patient.  Kathryn [name] is a 79 y.o. female who presented to the urgent care with a complaint of rash to left back for the past 2 days.  She denies changes in soaps, detergents, or anyone with similar symptoms.  She localizes the rash to her left back.  She describes it as red, itchy, burning and painful.  She has tried OTC medication without relief.  Denies alleviating or aggravating factors.  Reports similar symptoms in the past and was diagnosed with shingles.  ROS: As per HPI.  All other pertinent ROS negative.      Past Medical History:  Diagnosis Date  . Anxiety    takes Xanax bid prn  . Bruises easily    pt is on Plavix  . Chronic back pain    buldging disc  . Chronic neck pain   . Coronary artery disease    1 stent   . Dysphagia   . Headache(784.0)    related to cervical issues  . History of colonic polyps   . Hyperlipidemia    takes Crestor daily  . Hypertension    takes Coreg and Diovan daily  . Osteoporosis    was taking shot 2xyr;but not taking anymore  . Peripheral vascular disease ([place])   . Seasonal allergies    takes Zyrtec nightly   Past Surgical History:  Procedure Laterality Date  . ABDOMINAL HYSTERECTOMY  70's  . BUNIONECTOMY     left foot  . CARDIAC CATHETERIZATION  2009/2011   1 stent placed  . CHOLECYSTECTOMY  2009  . COLONOSCOPY    . ESOPHAGOGASTRODUODENOSCOPY    . LEFT HEART CATH AND CORONARY ANGIOGRAPHY N/A 01/16/2018   Procedure: LEFT HEART CATH AND CORONARY ANGIOGRAPHY;  Surgeon: Nigel Mormon, MD;  Location: [facility];  Service: Cardiovascular;  Laterality: N/A;  . TONSILLECTOMY     at age 26   Allergies  Allergen Reactions  . Codeine Hypertension  . Shellfish Allergy    No current facility-administered medications on file prior to encounter.   Current  Outpatient Medications on File Prior to Encounter  Medication Sig Dispense Refill  . acetaminophen (TYLENOL) 325 MG tablet Take 325 mg by mouth every 6 (six) hours as needed for mild pain.    [name] ALPRAZolam (XANAX) 0.25 MG tablet Take 1 tablet (0.25 mg total) by mouth 2 (two) times daily as needed for anxiety. 30 tablet 0  . amLODipine (NORVASC) 10 MG tablet TAKE ONE TABLET BY MOUTH ONCE DAILY. 90 tablet 0  . CALCIUM PO Take 1 tablet by mouth daily. 600 mg    . carvedilol (COREG) 6.25 MG tablet TAKE ONE TABLET BY MOUTH 2 TIMES A DAY 90 tablet 0  . cetirizine (ZYRTEC) 10 MG tablet Take 10 mg by mouth at bedtime.    . ferrous sulfate 324 MG TBEC Take 324 mg by mouth every other day.    . furosemide (LASIX) 20 MG tablet TAKE 1 TABLET BY MOUTH DAILY 90 tablet 0  . Investigational - Study Medication Take 1 tablet by mouth daily. Study name:008 Additional study details:For cholesterol (Dr. Skip Estimable)    . levocetirizine (XYZAL) 5 MG tablet Take 5 mg by mouth every evening.    . Multiple Vitamin (MULTIVITAMIN WITH MINERALS) TABS Take 1 tablet by mouth daily.    . nitroGLYCERIN (NITROSTAT)  0.4 MG SL tablet Place 1 tablet (0.4 mg total) under the tongue every 5 (five) minutes x 3 doses as needed for chest pain. 30 tablet 3  . pravastatin (PRAVACHOL) 40 MG tablet TAKE 1 TABLET WEEKLY. (SAME DAY EACH WEEK) 13 tablet 0  . pregabalin (LYRICA) 75 MG capsule TAKE 1 CAPSULE BY MOUTH THREE TIMES A DAY. 90 capsule 4  . triamcinolone cream (KENALOG) 0.1 % Apply 1 application topically 2 (two) times daily. 30 g 0   Social History   Socioeconomic History  . Marital status: Married    Spouse name: Not on file  . Number of children: 4  . Years of education: Not on file  . Highest education level: Not on file  Occupational History  . Occupation: retired  Tobacco Use  . Smoking status: Former Smoker    Packs/day: 0.25    Years: 5.00    Pack years: 1.25    Quit date: 11/27/1968    Years since quitting: 51.1  .  Smokeless tobacco: Never Used  Vaping Use  . Vaping Use: Never used  Substance and Sexual Activity  . Alcohol use: No  . Drug use: No  . Sexual activity: Not Currently  Other Topics Concern  . Not on file  Social History Narrative  . Not on file   Social Determinants of Health   Financial Resource Strain: Low Risk   . Difficulty of Paying Living Expenses: Not hard at all  Food Insecurity: No Food Insecurity  . Worried About Charity fundraiser in the Last Year: Never true  . Ran Out of Food in the Last Year: Never true  Transportation Needs: No Transportation Needs  . Lack of Transportation (Medical): No  . Lack of Transportation (Non-Medical): No  Physical Activity: Insufficiently Active  . Days of Exercise per Week: 7 days  . Minutes of Exercise per Session: 20 min  Stress: No Stress Concern Present  . Feeling of Stress : Not at all  Social Connections:   . Frequency of Communication with Friends and Family: Not on file  . Frequency of Social Gatherings with Friends and Family: Not on file  . Attends Religious Services: Not on file  . Active Member of Clubs or Organizations: Not on file  . Attends Archivist Meetings: Not on file  . Marital Status: Not on file  Intimate Partner Violence:   . Fear of Current or Ex-Partner: Not on file  . Emotionally Abused: Not on file  . Physically Abused: Not on file  . Sexually Abused: Not on file   Family History  Problem Relation Age of Onset  . Heart disease Mother        Heart Dissease before age 23  . Heart attack Mother   . Cancer Father   . Cancer Sister   . Leukemia Sister   . Cancer Brother   . Heart disease Brother        Amputation  . Heart attack Brother   . Heart disease Brother   . Heart attack Brother   . Dementia Other   . Anesthesia problems Neg Hx   . Hypotension Neg Hx   . Malignant hyperthermia Neg Hx   . Pseudochol deficiency Neg Hx     OBJECTIVE:  Vitals:   01/04/20 1111  BP: (!)  150/74  Pulse: 82  Resp: 17  Temp: 98.1 F (36.7 C)  TempSrc: Oral  SpO2: 98%  Weight: 218 lb 4.1 oz (99 kg)  Height: 5\' 2"  (1.575 m)    Physical Exam Vitals and nursing note reviewed.  Constitutional:      General: She is not in acute distress.    Appearance: Normal appearance. She is normal weight. She is not ill-appearing, toxic-appearing or diaphoretic.  HENT:     Head: Normocephalic.  Cardiovascular:     Rate and Rhythm: Normal rate and regular rhythm.     Pulses: Normal pulses.     Heart sounds: Normal heart sounds. No murmur heard.  No friction rub. No gallop.   Pulmonary:     Effort: Pulmonary effort is normal. No respiratory distress.     Breath sounds: Normal breath sounds. No stridor. No wheezing, rhonchi or rales.  Chest:     Chest wall: No tenderness.  Skin:    General: Skin is warm.     Findings: Rash present. Rash is papular.     Comments: Crops of red  papules over left back with no crusting  Neurological:     Mental Status: She is alert and oriented to person, place, and time.     LABS:  No results found for this or any previous visit (from the past 24 hour(s)).   ASSESSMENT & PLAN:  1. Herpes zoster without complication     Meds ordered this encounter  Medications  . valACYclovir (VALTREX) 1000 MG tablet    Sig: Take 1 tablet (1,000 mg total) by mouth 3 (three) times daily.    Dispense:  21 tablet    Refill:  0  . predniSONE (DELTASONE) 10 MG tablet    Sig: Take 2 tablets (20 mg total) by mouth daily.    Dispense:  15 tablet    Refill:  0    Discharge instructions   Rest and use ice/heat as needed for symptomatic relief Prescribed valacyclovir 1000mg  3x/day for 7 I am making note weird days Prescribed prednisone taper for inflammation and pain Use OTC medications such as ibuprofen/ tylenol.  Use tramadol as needed for break-through pain Follow up with PCP in 7-10 days if rash is still present Follow up with PCP if symptoms of burning,  stinging, tingling or numbness occur after rash resolves, you may need additional treatment Return here or go to ER if you have any new or worsening symptoms (such as eye involvement, severe pain, or signs of secondary infection such as fever, chills, nausea, vomiting, discharge, redness or warmth over site of rash)  Reviewed expectations re: course of current medical issues. Questions answered. Outlined signs and symptoms indicating need for more acute intervention. Patient verbalized understanding. After Visit Summary given.         Emerson Monte, FNP 01/04/20 1205    Emerson Monte, FNP 01/04/20 1207

## 2020-01-06 ENCOUNTER — Telehealth: Payer: Medicare Other

## 2020-01-07 DIAGNOSIS — M47818 Spondylosis without myelopathy or radiculopathy, sacral and sacrococcygeal region: Secondary | ICD-10-CM | POA: Diagnosis not present

## 2020-01-07 DIAGNOSIS — M47816 Spondylosis without myelopathy or radiculopathy, lumbar region: Secondary | ICD-10-CM | POA: Diagnosis not present

## 2020-01-14 ENCOUNTER — Other Ambulatory Visit: Payer: Self-pay | Admitting: Internal Medicine

## 2020-01-14 DIAGNOSIS — M4800 Spinal stenosis, site unspecified: Secondary | ICD-10-CM | POA: Diagnosis not present

## 2020-01-14 DIAGNOSIS — I5032 Chronic diastolic (congestive) heart failure: Secondary | ICD-10-CM | POA: Diagnosis not present

## 2020-01-14 DIAGNOSIS — N183 Chronic kidney disease, stage 3 unspecified: Secondary | ICD-10-CM | POA: Diagnosis not present

## 2020-01-14 DIAGNOSIS — D631 Anemia in chronic kidney disease: Secondary | ICD-10-CM | POA: Diagnosis not present

## 2020-01-14 DIAGNOSIS — I129 Hypertensive chronic kidney disease with stage 1 through stage 4 chronic kidney disease, or unspecified chronic kidney disease: Secondary | ICD-10-CM | POA: Diagnosis not present

## 2020-01-14 DIAGNOSIS — N189 Chronic kidney disease, unspecified: Secondary | ICD-10-CM | POA: Diagnosis not present

## 2020-01-15 NOTE — Telephone Encounter (Signed)
Alprazolam refill 

## 2020-01-28 DIAGNOSIS — M47816 Spondylosis without myelopathy or radiculopathy, lumbar region: Secondary | ICD-10-CM | POA: Diagnosis not present

## 2020-01-28 DIAGNOSIS — M47818 Spondylosis without myelopathy or radiculopathy, sacral and sacrococcygeal region: Secondary | ICD-10-CM | POA: Diagnosis not present

## 2020-01-30 ENCOUNTER — Encounter: Payer: Medicare Other | Admitting: Internal Medicine

## 2020-01-31 ENCOUNTER — Telehealth: Payer: Medicare Other

## 2020-02-07 ENCOUNTER — Other Ambulatory Visit: Payer: Self-pay | Admitting: Internal Medicine

## 2020-02-13 ENCOUNTER — Ambulatory Visit (INDEPENDENT_AMBULATORY_CARE_PROVIDER_SITE_OTHER): Payer: Medicare Other | Admitting: Nurse Practitioner

## 2020-02-13 ENCOUNTER — Other Ambulatory Visit: Payer: Self-pay

## 2020-02-13 ENCOUNTER — Encounter: Payer: Self-pay | Admitting: Nurse Practitioner

## 2020-02-13 VITALS — BP 134/78 | HR 86 | Temp 98.6°F | Ht 62.2 in | Wt 218.6 lb

## 2020-02-13 DIAGNOSIS — Z0000 Encounter for general adult medical examination without abnormal findings: Secondary | ICD-10-CM

## 2020-02-13 DIAGNOSIS — E7800 Pure hypercholesterolemia, unspecified: Secondary | ICD-10-CM

## 2020-02-13 DIAGNOSIS — N183 Chronic kidney disease, stage 3 unspecified: Secondary | ICD-10-CM

## 2020-02-13 DIAGNOSIS — I1310 Hypertensive heart and chronic kidney disease without heart failure, with stage 1 through stage 4 chronic kidney disease, or unspecified chronic kidney disease: Secondary | ICD-10-CM | POA: Diagnosis not present

## 2020-02-13 DIAGNOSIS — Z6839 Body mass index (BMI) 39.0-39.9, adult: Secondary | ICD-10-CM

## 2020-02-13 NOTE — Progress Notes (Signed)
I,Tianna Badgett,acting as a Education administrator for [employer], NP.,have documented all relevant documentation on the behalf of [employer], NP,as directed by  Bary Castilla, NP while in the presence of Bary Castilla, NP.  This visit occurred during the SARS-CoV-2 public health emergency.  Safety protocols were in place, including screening questions prior to the visit, additional usage of staff PPE, and extensive cleaning of exam room while observing appropriate contact time as indicated for disinfecting solutions.  Subjective:     Patient ID: Kathryn [name] , female    DOB: 03-08-1940 , 79 y.o.   MRN: [id number]   Chief Complaint  Patient presents with  . Annual Exam    HPI  Patient is here today for her physical exam. She is here today for a full physical examination. She is no longer followed by Gyn. She does not have any new concerns today. She goes to the kidney specialist. Her last visit was in November. Dr. Hollie Salk is the person she sees. As far as her diet, she does not eat pork, beef and she tries to avoid red meats. She eats chicken, [place] and lean meat. She tries to eat healthy and uses the air fryer. She has a bicycle at home that she uses to exercise. She tries to keep herself hydrated. She also sees Dr. Mina Marble for her right leg. She has a procedure scheduled with him next month. The patient is in compliance with her medications. The patient does not request any refills at this time.    Hypertension This is a chronic problem. The current episode started more than 1 year ago. The problem has been gradually improving since onset. The problem is uncontrolled. Pertinent negatives include no blurred vision, chest pain, headaches, palpitations or shortness of breath. Risk factors for coronary artery disease include dyslipidemia, obesity, post-menopausal state and sedentary lifestyle. The current treatment provides moderate improvement. Compliance problems include exercise.   Hypertensive end-organ damage includes kidney disease.     Past Medical History:  Diagnosis Date  . Anxiety    takes Xanax bid prn  . Bruises easily    pt is on Plavix  . Chronic back pain    buldging disc  . Chronic neck pain   . Coronary artery disease    1 stent   . Dysphagia   . Headache(784.0)    related to cervical issues  . History of colonic polyps   . Hyperlipidemia    takes Crestor daily  . Hypertension    takes Coreg and Diovan daily  . Osteoporosis    was taking shot 2xyr;but not taking anymore  . Peripheral vascular disease ([place])   . Seasonal allergies    takes Zyrtec nightly     Family History  Problem Relation Age of Onset  . Heart disease Mother        Heart Dissease before age 33  . Heart attack Mother   . Cancer Father   . Cancer Sister   . Leukemia Sister   . Cancer Brother   . Heart disease Brother        Amputation  . Heart attack Brother   . Heart disease Brother   . Heart attack Brother   . Dementia Other   . Anesthesia problems Neg Hx   . Hypotension Neg Hx   . Malignant hyperthermia Neg Hx   . Pseudochol deficiency Neg Hx      Current Outpatient Medications:  .  acetaminophen (TYLENOL) 325 MG tablet, Take 325 mg  by mouth every 6 (six) hours as needed for mild pain., Disp: , Rfl:  .  ALPRAZolam (XANAX) 0.25 MG tablet, TAKE (1) TABLET BY MOUTH TWICE DAILY AS NEEDED FOR ANXIETY., Disp: 30 tablet, Rfl: 0 .  amLODipine (NORVASC) 10 MG tablet, TAKE ONE TABLET BY MOUTH ONCE DAILY., Disp: 90 tablet, Rfl: 0 .  CALCIUM PO, Take 1 tablet by mouth daily. 600 mg, Disp: , Rfl:  .  carvedilol (COREG) 6.25 MG tablet, TAKE ONE TABLET BY MOUTH 2 TIMES A DAY, Disp: 90 tablet, Rfl: 0 .  cetirizine (ZYRTEC) 10 MG tablet, Take 10 mg by mouth at bedtime., Disp: , Rfl:  .  ferrous sulfate 324 MG TBEC, Take 324 mg by mouth every other day., Disp: , Rfl:  .  furosemide (LASIX) 20 MG tablet, TAKE 1 TABLET BY MOUTH DAILY, Disp: 90 tablet, Rfl: 0 .   Investigational - Study Medication, Take 1 tablet by mouth daily. Study name:008 Additional study details:For cholesterol (Dr. Skip Estimable), Disp: , Rfl:  .  levocetirizine (XYZAL) 5 MG tablet, Take 5 mg by mouth every evening., Disp: , Rfl:  .  Multiple Vitamin (MULTIVITAMIN WITH MINERALS) TABS, Take 1 tablet by mouth daily., Disp: , Rfl:  .  nitroGLYCERIN (NITROSTAT) 0.4 MG SL tablet, Place 1 tablet (0.4 mg total) under the tongue every 5 (five) minutes x 3 doses as needed for chest pain., Disp: 30 tablet, Rfl: 3 .  pravastatin (PRAVACHOL) 40 MG tablet, TAKE 1 TABLET WEEKLY. (SAME DAY EACH WEEK), Disp: 13 tablet, Rfl: 0 .  pregabalin (LYRICA) 75 MG capsule, TAKE 1 CAPSULE BY MOUTH THREE TIMES A DAY., Disp: 90 capsule, Rfl: 4 .  triamcinolone cream (KENALOG) 0.1 %, Apply 1 application topically 2 (two) times daily., Disp: 30 g, Rfl: 0 .  valACYclovir (VALTREX) 1000 MG tablet, Take 1 tablet (1,000 mg total) by mouth 3 (three) times daily., Disp: 21 tablet, Rfl: 0   Allergies  Allergen Reactions  . Codeine Hypertension  . Shellfish Allergy        Negative for: breast discharge, breast lump(s), breast pain and breast self exam. Associated symptoms include abnormal vaginal bleeding. Pertinent negatives include abnormal bleeding (hematology), anxiety, decreased libido, depression, difficulty falling sleep, dyspareunia, history of infertility, nocturia, sexual dysfunction, sleep disturbances, urinary incontinence, urinary urgency, vaginal discharge and vaginal itching. Diet regular.The patient states her exercise level is    . The patient's tobacco use is:  Social History   Tobacco Use  Smoking Status Former Smoker  . Packs/day: 0.25  . Years: 5.00  . Pack years: 1.25  . Quit date: 11/27/1968  . Years since quitting: 51.2  Smokeless Tobacco Never Used  . She has been exposed to passive smoke. The patient's alcohol use is:  Social History   Substance and Sexual Activity  Alcohol Use No     Review of Systems  Constitutional: Negative.  Negative for fever.  HENT: Negative.   Eyes: Negative.  Negative for blurred vision.  Respiratory: Negative.  Negative for cough and shortness of breath.   Cardiovascular: Negative.  Negative for chest pain and palpitations.  Gastrointestinal: Negative.   Endocrine: Negative.   Genitourinary: Negative.  Negative for urgency.  Skin: Negative.   Allergic/Immunologic: Negative.   Neurological: Negative.  Negative for weakness and headaches.  Hematological: Negative.   Psychiatric/Behavioral: Negative.      Today's Vitals   02/13/20 1456  BP: 134/78  Pulse: 86  Temp: 98.6 F (37 C)  TempSrc: Oral  Weight: 218  lb 9.6 oz (99.2 kg)  Height: 5' 2.2" (1.58 m)   Body mass index is 39.73 kg/m.  Wt Readings from Last 3 Encounters:  02/13/20 218 lb 9.6 oz (99.2 kg)  01/04/20 218 lb 4.1 oz (99 kg)  12/03/19 220 lb (99.8 kg)     Objective:  Physical Exam Constitutional:      Appearance: Normal appearance. She is obese.  HENT:     Head: Normocephalic and atraumatic.     Right Ear: Tympanic membrane normal.     Left Ear: Tympanic membrane normal.     Nose:     Comments: Deferred. Patient masked.     Mouth/Throat:     Comments: Deferred. Patient masked.  Cardiovascular:     Rate and Rhythm: Normal rate and regular rhythm.     Pulses: Normal pulses.     Heart sounds: Murmur heard.    Pulmonary:     Effort: Pulmonary effort is normal. No respiratory distress.     Breath sounds: Normal breath sounds. No wheezing.  Chest:     Chest wall: No tenderness.  Abdominal:     General: Bowel sounds are normal.     Palpations: Abdomen is soft.  Genitourinary:    Comments: Deferred.  Musculoskeletal:        General: Normal range of motion.     Cervical back: Normal range of motion.     Right lower leg: No edema.     Left lower leg: No edema.  Skin:    General: Skin is warm and dry.     Capillary Refill: Capillary refill takes  less than 2 seconds.  Neurological:     Mental Status: She is alert and oriented to person, place, and time. Mental status is at baseline.  Psychiatric:        Mood and Affect: Mood normal.        Behavior: Behavior normal.        Thought Content: Thought content normal.        Judgment: Judgment normal.         Assessment And Plan:     1. Routine general medical examination at health care facility -The importance of screening, immunization and taking multivitamins was discussed with the patient.  -The patient WAS ENCOURAGED TO AVOID PROCESSED FOODS, DIET [place]. ALSO ENCOURAGED TO INCREASE INTAKE OF FRESH FRUITS AND VEGGIES. THE IMPORTANCE OF REGULAR EXERCISE TO HELP DECREASE CARDIAC RISK, MAINTAIN/ACHIEVE OPTIMAL WEIGHT AND TO DECREASE CANCER RISK WAS ALSO DISCUSSED WITH THE PATIENT.  2. Hypertensive heart and renal disease with renal failure, stage 1 through stage 4 or unspecified chronic kidney disease, without heart failure -Previous labs for BMP8+GFR was discussed with the patient.  -Uncontrolled. She will continue her current meds  -Advised on avoiding salt intake and processed foods.   3. Stage 3 chronic kidney disease, unspecified whether stage 3a or 3b CKD ([place]) -Previous labs reviewed with patient.  -The patient is being followed by kidney specialist Dr. Hollie Salk.  -Continue current meds  4. Pure hypercholesterolemia - Lipid Panel -The patient was advised on a healthy lifestyle to avoid high fatty saturated foods and dairy products.  -The patient was advised on increasing intake of fish, fiber and lean meats.  -the patient was advised about exercises 4-5 days week for atleast 30-45 min. Daily.    5. Class 2 severe obesity due to excess calories with serious comorbidity and body mass index (BMI) of 39.0 to 39.9 in adult [facility]) -  Importance of achieving optimal weight to decrease risk of cardiovascular disease and cancers was discussed with the patient in  full detail. -Continue has a bike at home that she will continue to use for exercise.    Patient was given opportunity to ask questions. Patient verbalized understanding of the plan and was able to repeat key elements of the plan. All questions were answered to their satisfaction.   Charlesetta Ivory, NP   I, Charlesetta Ivory, NP, have reviewed all documentation for this visit. The documentation on 02/13/20 for the exam, diagnosis, procedures, and orders are all accurate and complete.  THE PATIENT IS ENCOURAGED TO PRACTICE SOCIAL DISTANCING DUE TO THE COVID-19 PANDEMIC.

## 2020-02-13 NOTE — Patient Instructions (Signed)
Health Maintenance, Female Adopting a healthy lifestyle and getting preventive care are important in promoting health and wellness. Ask your health care provider about:  The right schedule for you to have regular tests and exams.  Things you can do on your own to prevent diseases and keep yourself healthy. What should I know about diet, weight, and exercise? Eat a healthy diet   Eat a diet that includes plenty of vegetables, fruits, low-fat dairy products, and lean protein.  Do not eat a lot of foods that are high in solid fats, added sugars, or sodium. Maintain a healthy weight Body mass index (BMI) is used to identify weight problems. It estimates body fat based on height and weight. Your health care provider can help determine your BMI and help you achieve or maintain a healthy weight. Get regular exercise Get regular exercise. This is one of the most important things you can do for your health. Most adults should:  Exercise for at least 150 minutes each week. The exercise should increase your heart rate and make you sweat (moderate-intensity exercise).  Do strengthening exercises at least twice a week. This is in addition to the moderate-intensity exercise.  Spend less time sitting. Even light physical activity can be beneficial. Watch cholesterol and blood lipids Have your blood tested for lipids and cholesterol at 80 years of age, then have this test every 5 years. Have your cholesterol levels checked more often if:  Your lipid or cholesterol levels are high.  You are older than 80 years of age.  You are at high risk for heart disease. What should I know about cancer screening? Depending on your health history and family history, you may need to have cancer screening at various ages. This may include screening for:  Breast cancer.  Cervical cancer.  Colorectal cancer.  Skin cancer.  Lung cancer. What should I know about heart disease, diabetes, and high blood  pressure? Blood pressure and heart disease  High blood pressure causes heart disease and increases the risk of stroke. This is more likely to develop in people who have high blood pressure readings, are of African descent, or are overweight.  Have your blood pressure checked: ? Every 3-5 years if you are 18-39 years of age. ? Every year if you are 40 years old or older. Diabetes Have regular diabetes screenings. This checks your fasting blood sugar level. Have the screening done:  Once every three years after age 40 if you are at a normal weight and have a low risk for diabetes.  More often and at a younger age if you are overweight or have a high risk for diabetes. What should I know about preventing infection? Hepatitis B If you have a higher risk for hepatitis B, you should be screened for this virus. Talk with your health care provider to find out if you are at risk for hepatitis B infection. Hepatitis C Testing is recommended for:  Everyone born from 1945 through 1965.  Anyone with known risk factors for hepatitis C. Sexually transmitted infections (STIs)  Get screened for STIs, including gonorrhea and chlamydia, if: ? You are sexually active and are younger than 80 years of age. ? You are older than 80 years of age and your health care provider tells you that you are at risk for this type of infection. ? Your sexual activity has changed since you were last screened, and you are at increased risk for chlamydia or gonorrhea. Ask your health care provider if   you are at risk.  Ask your health care provider about whether you are at high risk for HIV. Your health care provider may recommend a prescription medicine to help prevent HIV infection. If you choose to take medicine to prevent HIV, you should first get tested for HIV. You should then be tested every 3 months for as long as you are taking the medicine. Pregnancy  If you are about to stop having your period (premenopausal) and  you may become pregnant, seek counseling before you get pregnant.  Take 400 to 800 micrograms (mcg) of folic acid every day if you become pregnant.  Ask for birth control (contraception) if you want to prevent pregnancy. Osteoporosis and menopause Osteoporosis is a disease in which the bones lose minerals and strength with aging. This can result in bone fractures. If you are 65 years old or older, or if you are at risk for osteoporosis and fractures, ask your health care provider if you should:  Be screened for bone loss.  Take a calcium or vitamin D supplement to lower your risk of fractures.  Be given hormone replacement therapy (HRT) to treat symptoms of menopause. Follow these instructions at home: Lifestyle  Do not use any products that contain nicotine or tobacco, such as cigarettes, e-cigarettes, and chewing tobacco. If you need help quitting, ask your health care provider.  Do not use street drugs.  Do not share needles.  Ask your health care provider for help if you need support or information about quitting drugs. Alcohol use  Do not drink alcohol if: ? Your health care provider tells you not to drink. ? You are pregnant, may be pregnant, or are planning to become pregnant.  If you drink alcohol: ? Limit how much you use to 0-1 drink a day. ? Limit intake if you are breastfeeding.  Be aware of how much alcohol is in your drink. In the [place] one drink equals one 12 oz bottle of beer (355 mL), one 5 oz glass of wine (148 mL), or one 1 oz glass of hard liquor (44 mL). General instructions  Schedule regular health, dental, and eye exams.  Stay current with your vaccines.  Tell your health care provider if: ? You often feel depressed. ? You have ever been abused or do not feel safe at home. Summary  Adopting a healthy lifestyle and getting preventive care are important in promoting health and wellness.  Follow your health care provider's instructions about healthy  diet, exercising, and getting tested or screened for diseases.  Follow your health care provider's instructions on monitoring your cholesterol and blood pressure. This information is not intended to replace advice given to you by your health care provider. Make sure you discuss any questions you have with your health care provider. Document Revised: 01/24/2018 Document Reviewed: 01/24/2018 Elsevier Patient Education  2020 [employer]  

## 2020-02-14 LAB — LIPID PANEL
Chol/HDL Ratio: 3 ratio (ref 0.0–4.4)
Cholesterol, Total: 185 mg/dL (ref 100–199)
HDL: 62 mg/dL (ref >39)
LDL Chol Calc (NIH): 107 mg/dL — ABNORMAL HIGH (ref 0–99)
Triglycerides: 88 mg/dL (ref 0–149)
VLDL Cholesterol Cal: 16 mg/dL (ref 5–40)

## 2020-02-20 ENCOUNTER — Other Ambulatory Visit: Payer: Self-pay | Admitting: Internal Medicine

## 2020-02-25 DIAGNOSIS — M47818 Spondylosis without myelopathy or radiculopathy, sacral and sacrococcygeal region: Secondary | ICD-10-CM | POA: Diagnosis not present

## 2020-03-05 ENCOUNTER — Other Ambulatory Visit: Payer: Self-pay | Admitting: Internal Medicine

## 2020-03-05 NOTE — Telephone Encounter (Signed)
Please refill patient's prescription YL,RMA 

## 2020-03-10 ENCOUNTER — Telehealth: Payer: Medicare Other

## 2020-03-10 ENCOUNTER — Ambulatory Visit: Payer: Self-pay

## 2020-03-10 DIAGNOSIS — I1310 Hypertensive heart and chronic kidney disease without heart failure, with stage 1 through stage 4 chronic kidney disease, or unspecified chronic kidney disease: Secondary | ICD-10-CM

## 2020-03-10 DIAGNOSIS — I214 Non-ST elevation (NSTEMI) myocardial infarction: Secondary | ICD-10-CM

## 2020-03-10 DIAGNOSIS — M4807 Spinal stenosis, lumbosacral region: Secondary | ICD-10-CM

## 2020-03-10 DIAGNOSIS — N183 Chronic kidney disease, stage 3 unspecified: Secondary | ICD-10-CM

## 2020-03-10 DIAGNOSIS — I10 Essential (primary) hypertension: Secondary | ICD-10-CM

## 2020-03-10 NOTE — Telephone Encounter (Signed)
This encounter was created in error - please disregard.

## 2020-03-11 NOTE — Patient Instructions (Signed)
Goals Addressed      Patient Stated   .  to have less Sciatica pain (pt-stated)        Timeframe:  Short-Term Goal Priority:  High Start Date:  03/10/20                           Expected End Date:   04/10/20  Follow Up Date: 03/16/20  Over the next 90 days, patient will:  .  Patient verbalizes understanding of plan to have an outpatient intervention for Sciatica pain with Dr. Mina Marble on 03/12/20 . Self-administers medications as prescribed . Attends all scheduled provider appointments . Calls pharmacy for medication refills . Calls provider office for new concerns or questions

## 2020-03-11 NOTE — Chronic Care Management (AMB) (Signed)
Chronic Care Management   CCM RN Visit Note  03/10/2020 Name: Kathryn [name] MRN: [id number] DOB: [birth year]  Subjective: Kathryn [name] is a 80 y.o. year old female who is a primary care patient of Glendale Chard, MD. The care management team was consulted for assistance with disease management and care coordination needs.    Engaged with patient by telephone for follow up visit in response to provider referral for case management and/or care coordination services.   Consent to Services:  The patient was given information about Chronic Care Management services, agreed to services, and gave verbal consent prior to initiation of services.  Please see initial visit note for detailed documentation.   Patient agreed to services and verbal consent obtained.   Assessment: Review of patient past medical history, allergies, medications, health status, including review of consultants reports, laboratory and other test data, was performed as part of comprehensive evaluation and provision of chronic care management services.   SDOH (Social Determinants of Health) assessments and interventions performed:  No  CCM Care Plan  Allergies  Allergen Reactions  . Codeine Hypertension  . Shellfish Allergy     Outpatient Encounter Medications as of 03/10/2020  Medication Sig  . acetaminophen (TYLENOL) 325 MG tablet Take 325 mg by mouth every 6 (six) hours as needed for mild pain.  [name] ALPRAZolam (XANAX) 0.25 MG tablet TAKE (1) TABLET BY MOUTH TWICE DAILY AS NEEDED FOR ANXIETY.  [name] amLODipine (NORVASC) 10 MG tablet TAKE ONE TABLET BY MOUTH ONCE DAILY.  [name] CALCIUM PO Take 1 tablet by mouth daily. 600 mg  . carvedilol (COREG) 6.25 MG tablet TAKE ONE TABLET BY MOUTH 2 TIMES A DAY  . cetirizine (ZYRTEC) 10 MG tablet Take 10 mg by mouth at bedtime.  . ferrous sulfate 324 MG TBEC Take 324 mg by mouth every other day.  . furosemide (LASIX) 20 MG tablet TAKE 1 TABLET BY MOUTH DAILY  . Investigational -  Study Medication Take 1 tablet by mouth daily. Study name:008 Additional study details:For cholesterol (Dr. Skip Estimable)  . levocetirizine (XYZAL) 5 MG tablet Take 5 mg by mouth every evening.  . Multiple Vitamin (MULTIVITAMIN WITH MINERALS) TABS Take 1 tablet by mouth daily.  . nitroGLYCERIN (NITROSTAT) 0.4 MG SL tablet Place 1 tablet (0.4 mg total) under the tongue every 5 (five) minutes x 3 doses as needed for chest pain.  . pravastatin (PRAVACHOL) 40 MG tablet TAKE 1 TABLET WEEKLY. (SAME DAY EACH WEEK)  . pregabalin (LYRICA) 75 MG capsule TAKE 1 CAPSULE BY MOUTH THREE TIMES A DAY.  [name] triamcinolone cream (KENALOG) 0.1 % Apply 1 application topically 2 (two) times daily.  . valACYclovir (VALTREX) 1000 MG tablet Take 1 tablet (1,000 mg total) by mouth 3 (three) times daily.   No facility-administered encounter medications on file as of 03/10/2020.    Patient Active Problem List   Diagnosis Date Noted  . Coronary artery disease of native artery of native heart with stable angina pectoris ([place]) 06/07/2018  . Anxiety 01/18/2018  . 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 Chronic renal disease, 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  . Other abnormal glucose 01/15/2018  . 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 NSTEMI (non-ST elevated myocardial infarction) ([place]) 01/14/2018  . Essential hypertension 01/14/2018  . Hyperlipidemia 01/14/2018  . Carotid stenosis, asymptomatic, bilateral 09/06/2012    Conditions to be addressed/monitored:CKD III, Hypertensive nephropathy, prediabetes, NSTEMI, Essential HTN, Spinal stenosis of lumbosacral region  Care Plan : Nonspecific Low Back Pain (Adult)  Updates made by Barb Merino  L, RN since 03/11/2020 12:00 AM    Problem: Chronic Low Back Pain   Priority: High    Goal: Chronic Low Back Pain Managed   Start Date: 03/10/2020  Expected End Date: 04/10/2020  This Visit's Progress: On track  Priority: High  Note:   Current Barriers:  [name] Knowledge Deficits related to managing  acute/chronic pain . Non-adherence to scheduled provider appointments . Non-adherence to prescribed medication regimen . Difficulty obtaining medications . Chronic Disease Management support and education needs related to chronic pain . Acute Sciatica Pain  Nurse Case Manager Clinical Goal(s):  [name] Over the next 30 days patient will verbalize acceptable level of pain relief and ability to engage in desired activities Interventions:  . Collaboration with Glendale Chard, MD regarding development and update of comprehensive plan of care as evidenced by provider attestation and co-signature . Inter-disciplinary care team collaboration (see longitudinal plan of care) . Successful call completed with patient, although patient wants to keep call brief  . 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 . Determined patient continue to have chronic back pain related to having a spinal stenosis managed by Dr. Normajean Glasgow with [facility] . Determined she continues to have acute sciatica pain unmanaged by pain meds and other interventions tried by MD . Determined patient will undergo an outpatient procedure on 03/12/20 with Dr. Mina Marble at which time he will "burn" the sciatica nerve to help relieve her pain . Discussed patient would like a call back next week . Assessed for knowledge and understanding related to pre-operative instructions and what to expect post operatively, patient verbalizes Dr. Leland Her office staff are scheduled to contact her by phone on 03/11/20 to review this information with her . Discussed plans with patient for ongoing care management follow up and provided patient with direct contact information for care management team Patient Goals/Self Care Activities:  Over the next 30 days, patient will:  . Patient verbalizes understanding of plan to have an outpatient  intervention for Sciatica pain with Dr. Mina Marble on 03/12/20 . Self-administers medications as prescribed . Attends all scheduled provider appointments . Calls pharmacy for medication refills . Calls provider office for new concerns or questions  Follow Up Plan: Telephone follow up appointment with care management team member scheduled for: 03/16/20      Plan:Telephone follow up appointment with care management team member scheduled for:  03/16/20  Barb Merino, RN, BSN, CCM Care Management Coordinator [facility]  Direct Phone: [phone]

## 2020-03-12 DIAGNOSIS — M47818 Spondylosis without myelopathy or radiculopathy, sacral and sacrococcygeal region: Secondary | ICD-10-CM | POA: Diagnosis not present

## 2020-03-12 DIAGNOSIS — M533 Sacrococcygeal disorders, not elsewhere classified: Secondary | ICD-10-CM | POA: Diagnosis not present

## 2020-03-12 DIAGNOSIS — M47816 Spondylosis without myelopathy or radiculopathy, lumbar region: Secondary | ICD-10-CM | POA: Diagnosis not present

## 2020-03-16 ENCOUNTER — Telehealth: Payer: Medicare Other

## 2020-03-17 ENCOUNTER — Ambulatory Visit (INDEPENDENT_AMBULATORY_CARE_PROVIDER_SITE_OTHER): Payer: Medicare Other

## 2020-03-17 ENCOUNTER — Telehealth: Payer: Medicare Other

## 2020-03-17 DIAGNOSIS — I1310 Hypertensive heart and chronic kidney disease without heart failure, with stage 1 through stage 4 chronic kidney disease, or unspecified chronic kidney disease: Secondary | ICD-10-CM

## 2020-03-17 DIAGNOSIS — M4807 Spinal stenosis, lumbosacral region: Secondary | ICD-10-CM

## 2020-03-17 DIAGNOSIS — I214 Non-ST elevation (NSTEMI) myocardial infarction: Secondary | ICD-10-CM

## 2020-03-17 DIAGNOSIS — N183 Chronic kidney disease, stage 3 unspecified: Secondary | ICD-10-CM

## 2020-03-17 DIAGNOSIS — I10 Essential (primary) hypertension: Secondary | ICD-10-CM

## 2020-03-17 DIAGNOSIS — R7303 Prediabetes: Secondary | ICD-10-CM

## 2020-03-17 NOTE — Progress Notes (Signed)
Primary Physician/Referring:  Glendale Chard, MD  Patient ID: Kathryn [name], female    DOB: 10/01/1940, 80 y.o.   MRN: [id number]  Chief Complaint  Patient presents with  . Chest Pain  . Follow-up   HPI:    Kathryn [name]  is a 80 y.o. AAFwith CAD s/p PCI to LCx 2009, hypertension, statin intolerant hyperlipidemia, CKD stage 3 and mild bilateral asymptomatic carotid stenosis. She has had an episode of syncope that occurred in May 2020 suggestive of vasovagal episodes, no recurrence.    Coronary Angiography on 01/16/18 that showed no obstructive CAD and patent stents from 2009. Patient is not on statin due to intolerance, she is enrolled in esperion trial.   Patient presents for urgent visit at her request with complaints of chest pain. At last visit with Dr. Einar Gip 08/23/2019 patient was stable and feeling well, therefore she had been recommended for annual follow up. Patient reports 2 episodes of left sided chest pain over the last 1 week. She describes the pain as achy, lasting about 1 minute. There are no identifiable triggers, both episodes have occurred while patient is at rest.  She has not taken sublingual nitroglycerin, as her supply had expired.  She states that with one episode of this pain she noted tingling in the fingers of her right hand.  Otherwise she denies other associated symptoms.  Denies shortness of breath, palpitations, dizziness, syncope, near syncope, leg swelling, orthopnea, PND.  Patient reports she has experienced increased fatigue over the last 2 days as well.  Patient reports home blood pressure readings which are well controlled, averaging 120s/65 mmHg.  Past Medical History:  Diagnosis Date  . Anxiety    takes Xanax bid prn  . Bruises easily    pt is on Plavix  . Chronic back pain    buldging disc  . Chronic neck pain   . Coronary artery disease    1 stent   . Dysphagia   . Headache(784.0)    related to cervical issues  . History of colonic  polyps   . Hyperlipidemia    takes Crestor daily  . Hypertension    takes Coreg and Diovan daily  . Osteoporosis    was taking shot 2xyr;but not taking anymore  . Peripheral vascular disease ([place])   . Seasonal allergies    takes Zyrtec nightly   Past Surgical History:  Procedure Laterality Date  . ABDOMINAL HYSTERECTOMY  70's  . BUNIONECTOMY     left foot  . CARDIAC CATHETERIZATION  2009/2011   1 stent placed  . CHOLECYSTECTOMY  2009  . COLONOSCOPY    . ESOPHAGOGASTRODUODENOSCOPY    . LEFT HEART CATH AND CORONARY ANGIOGRAPHY N/A 01/16/2018   Procedure: LEFT HEART CATH AND CORONARY ANGIOGRAPHY;  Surgeon: Nigel Mormon, MD;  Location: [facility];  Service: Cardiovascular;  Laterality: N/A;  . TONSILLECTOMY     at age 89   Social History   Tobacco Use  . Smoking status: Former Smoker    Packs/day: 0.25    Years: 5.00    Pack years: 1.25    Quit date: 11/27/1968    Years since quitting: 51.3  . Smokeless tobacco: Never Used  Substance Use Topics  . Alcohol use: No   Marital status: Married  ROS  Review of Systems  Constitutional: Negative for chills, decreased appetite, malaise/fatigue and weight gain.  Cardiovascular: Positive for chest pain (left sided post herpetic and chronic, with increased frequency ) and  dyspnea on exertion (stable). Negative for leg swelling and syncope.  Endocrine: Negative for cold intolerance.  Hematologic/Lymphatic: Does not bruise/bleed easily.  Musculoskeletal: Negative for back pain and joint swelling.  Gastrointestinal: Negative for abdominal pain, anorexia, change in bowel habit, melena and nausea.  Neurological: Negative for headaches and light-headedness.  Psychiatric/Behavioral: Negative for depression and substance abuse.  All other systems reviewed and are negative.  Objective   Vitals with BMI 03/18/2020 02/13/2020 01/04/2020  Height 5\' 2"  5' 2.2" 5\' 2"   Weight 227 lbs 218 lbs 10 oz 218 lbs 4 oz  BMI 41.51 03.55  97.41  Systolic 638 453 646  Diastolic 58 78 74  Pulse 74 86 82      Physical Exam Constitutional:      Comments: Moderately built and moderately obese in no acute distress.  HENT:     Head: Atraumatic.  Neck:     Thyroid: No thyromegaly.     Comments: Short neck Cardiovascular:     Rate and Rhythm: Normal rate and regular rhythm.     Pulses: Intact distal pulses.          Carotid pulses are on the right side with bruit and on the left side with bruit.    Heart sounds: Murmur heard.   Harsh midsystolic murmur is present with a grade of 3/6 at the upper right sternal border radiating to the neck. No gallop.      Comments: No edema. No JVD Pulmonary:     Effort: Pulmonary effort is normal.     Breath sounds: Normal breath sounds.  Abdominal:     Comments: Obese with mild pannus  Musculoskeletal:        General: Normal range of motion.     Cervical back: Neck supple.     Right lower leg: No edema.     Left lower leg: No edema.  Skin:    General: Skin is warm and dry.     Capillary Refill: Capillary refill takes less than 2 seconds.  Neurological:     Mental Status: She is alert and oriented to person, place, and time.    Laboratory examination:    Recent Labs    04/23/19 1134 12/30/19 1235  NA 142 144  K 4.8 4.6  CL 104 104  CO2 22 24  GLUCOSE 87 87  BUN 17 20  CREATININE 1.53* 1.28*  CALCIUM 10.1 9.5  GFRNONAA 32* 40*  GFRAA 37* 46*   CrCl cannot be calculated (Patient's most recent lab result is older than the maximum 21 days allowed.).  CMP Latest Ref Rng & Units 12/30/2019 04/23/2019 01/22/2019  Glucose 65 - 99 mg/dL 87 87 85  BUN 8 - 27 mg/dL 20 17 29(H)  Creatinine 0.57 - 1.00 mg/dL 1.28(H) 1.53(H) 1.33(H)  Sodium 134 - 144 mmol/L 144 142 140  Potassium 3.5 - 5.2 mmol/L 4.6 4.8 4.7  Chloride 96 - 106 mmol/L 104 104 103  CO2 20 - 29 mmol/L 24 22 22   Calcium 8.7 - 10.3 mg/dL 9.5 10.1 10.2  Total Protein 6.0 - 8.5 g/dL - 7.2 6.9  Total Bilirubin 0.0 -  1.2 mg/dL - 0.4 0.3  Alkaline Phos 39 - 117 IU/L - 99 92  AST 0 - 40 IU/L - 18 20  ALT 0 - 32 IU/L - 11 14   CBC Latest Ref Rng & Units 04/23/2019 01/22/2019 06/20/2018  WBC 3.4 - 10.8 x10E3/uL 8.4 9.3 7.5  Hemoglobin 11.1 - 15.9 g/dL 12.5 11.0(L)  11.0(L)  Hematocrit 34.0 - 46.6 % 40.4 35.8 35.1  Platelets 150 - 450 x10E3/uL 463(H) 405 429   Lipid Panel     Component Value Date/Time   CHOL 185 02/13/2020 1602   TRIG 88 02/13/2020 1602   HDL 62 02/13/2020 1602   CHOLHDL 3.0 02/13/2020 1602   LDLCALC 107 (H) 02/13/2020 1602   HEMOGLOBIN A1C Lab Results  Component Value Date   HGBA1C 5.6 12/30/2019   TSH No results for input(s): TSH in the last 8760 hours. Medications and allergies   Allergies  Allergen Reactions  . Codeine Hypertension  . Shellfish Allergy      Current Outpatient Medications  Medication Instructions  . acetaminophen (TYLENOL) 325 mg, Oral, Every 6 hours PRN  . ALPRAZolam (XANAX) 0.25 MG tablet TAKE (1) TABLET BY MOUTH TWICE DAILY AS NEEDED FOR ANXIETY.  [name] amLODipine (NORVASC) 10 MG tablet TAKE ONE TABLET BY MOUTH ONCE DAILY.  [name] Aspirin 81 MG CAPS No dose, route, or frequency recorded.  [name] CALCIUM PO 1 tablet, Oral, Daily, 600 mg  . carvedilol (COREG) 12.5 mg, Oral, 2 times daily with meals  . cetirizine (ZYRTEC) 10 mg, Daily at bedtime  . ferrous sulfate 324 mg, Oral, Every other day  . furosemide (LASIX) 20 mg, Oral, Daily PRN  . Investigational - Study Medication 1 tablet, Oral, Daily, Study name:008Additional study details:For cholesterol (Dr. Skip Estimable)   . levocetirizine (XYZAL) 5 mg, Oral, Every evening  . Multiple Vitamin (MULTIVITAMIN WITH MINERALS) TABS 1 tablet, Oral, Daily  . nitroGLYCERIN (NITROSTAT) 0.4 mg, Sublingual, Every 5 min x3 PRN  . pravastatin (PRAVACHOL) 40 MG tablet TAKE 1 TABLET WEEKLY. (SAME DAY EACH WEEK)  . pregabalin (LYRICA) 75 MG capsule TAKE 1 CAPSULE BY MOUTH THREE TIMES A DAY.  [name] triamcinolone cream (KENALOG) 0.1 % 1  application, Topical, 2 times daily  . valACYclovir (VALTREX) 1,000 mg, Oral, 3 times daily    Radiology:  No results found.   Cardiac Studies:   Sleep Study  [2011]: Negative for sleep apnea  Coronary Angiogram   01/16/18: Patent Sypher stent in Cx, 2.75 x 15 mm DES placed on 01/22/08. Normal LVEF. NO other significant CAD.  Echocardiogram 12/19/2018: Left ventricle cavity is normal in size. Moderate concentric hypertrophy of the left ventricle. Normal LV systolic function with EF 67%. Normal global wall motion. Doppler evidence of grade I (impaired) diastolic dysfunction, normal LAP.  Left atrial cavity is mildly dilated. Trileaflet aortic valve. Mild aortic valve leaflet calcification. Mild aortic valve stenosis. Aortic valve mean gradient of 10 mmHg, Vmax of 2.1 m/s. Calculated aortic valve area by continuity equation is 1.2 cm. Moderate (Grade II) aortic regurgitation. Mildly restricted mitral valve leaflets. Moderate (Grade II) mitral regurgitation. Mild tricuspid regurgitation. Estimated pulmonary artery systolic pressure is 39 mmHg.  No significant change compared to previous study on 01/15/2018.  Carotid artery duplex 11/26/2019:  Stenosis in the right internal carotid artery (50-69%). Stenosis in the right external carotid artery (<50%).  Stenosis in the left internal carotid artery (16-49%). Stenosis in the left external carotid artery (<50%).  Antegrade right vertebral artery flow. Antegrade left vertebral artery flow.  No significant change from 05/29/2019. Follow up in six months is appropriate if clinically indicated.   EKG   EKG 03/18/2020: Sinus rhythm rate of 72 bpm, left atrial enlargement. Normal axis. Poor R wave progression, cannot exclude anteroseptal infarct old. Compared to EKG 11/28/2018, no significant change.  EKG 08/23/2019: Normal sinus rhythm with rate of 75 bpm,  normal axis.  No evidence of ischemia, normal EKG.   Assessment     ICD-10-CM   1. Coronary  artery disease of native artery of native heart with stable angina pectoris (HCC)  I25.118 furosemide (LASIX) 20 MG tablet    carvedilol (COREG) 6.25 MG tablet    nitroGLYCERIN (NITROSTAT) 0.4 MG SL tablet  2. Pure hypercholesterolemia  E78.00   3. Essential hypertension  I10 furosemide (LASIX) 20 MG tablet  4. Precordial pain  R07.2 EKG 12-Lead  5. Post herpetic neuralgia  B02.29     Meds ordered this encounter  Medications  . furosemide (LASIX) 20 MG tablet    Sig: Take 1 tablet (20 mg total) by mouth daily as needed for fluid.    Dispense:  90 tablet    Refill:  0  . carvedilol (COREG) 6.25 MG tablet    Sig: Take 2 tablets (12.5 mg total) by mouth 2 (two) times daily with a meal.    Dispense:  90 tablet    Refill:  0  . nitroGLYCERIN (NITROSTAT) 0.4 MG SL tablet    Sig: Place 1 tablet (0.4 mg total) under the tongue every 5 (five) minutes x 3 doses as needed for chest pain.    Dispense:  30 tablet    Refill:  3   Medications Discontinued During This Encounter  Medication Reason  . nitroGLYCERIN (NITROSTAT) 0.4 MG SL tablet Reorder  . furosemide (LASIX) 20 MG tablet   . carvedilol (COREG) 6.25 MG tablet     Recommendations:    Kathryn [name]  is a 80 y.o. 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 She has had an episode of syncope that occurred in May 2020 suggestiv of vasovagal episodes, no recurrence.    Coronary Angiography on 01/16/18 that showed no obstructive CAD and patent stents from 2009. Patient is not on statin due to intolerance, she is enrolled in esperion trial.   Presents for urgent visit with complaints of intermittent nonexertional chest pain lasting less than 1 minute. Patient does have chronic left-sided post herpetic chest, however she reports increased frequency of chest discomfort which she describes as pressure. Patient's blood pressure is also elevated  in the office today. EKG today was without signs of ischemia or underlying injury pattern, unchanged from previous. In view of increased frequency of chest pressure symptoms as well as elevated blood pressure will increase carvedilol from 6.25mg  to 12.5 mg twice daily. Patient will continue to monitor blood pressure at home and bring with her a written log to her next visit. Notably patient reports home blood pressure readings are well controlled, however there are no readings for me to review today. I have also refilled patient's sublingual nitroglycerin. S/L NTG was prescribed and explained how to and when to use it and to notify us if there is change in frequency of use.   Counseled patient to notify our office or go to the emergency department for further evaluation if she experiences worsening symptoms. Patient verbalized understanding and agreement.  Follow-up in 2 weeks, sooner if needed, for precordial pain.   Alethia Berthold, PA-C 03/18/2020, 2:24 PM Office: [phone]

## 2020-03-18 ENCOUNTER — Other Ambulatory Visit: Payer: Self-pay

## 2020-03-18 ENCOUNTER — Encounter: Payer: Self-pay | Admitting: Student

## 2020-03-18 ENCOUNTER — Ambulatory Visit: Payer: Medicare Other | Admitting: Student

## 2020-03-18 VITALS — BP 142/58 | HR 74 | Temp 97.5°F | Resp 16 | Ht 62.0 in | Wt 227.0 lb

## 2020-03-18 DIAGNOSIS — I350 Nonrheumatic aortic (valve) stenosis: Secondary | ICD-10-CM

## 2020-03-18 DIAGNOSIS — R072 Precordial pain: Secondary | ICD-10-CM | POA: Diagnosis not present

## 2020-03-18 DIAGNOSIS — E7800 Pure hypercholesterolemia, unspecified: Secondary | ICD-10-CM | POA: Diagnosis not present

## 2020-03-18 DIAGNOSIS — I25118 Atherosclerotic heart disease of native coronary artery with other forms of angina pectoris: Secondary | ICD-10-CM

## 2020-03-18 DIAGNOSIS — I10 Essential (primary) hypertension: Secondary | ICD-10-CM | POA: Diagnosis not present

## 2020-03-18 DIAGNOSIS — B0229 Other postherpetic nervous system involvement: Secondary | ICD-10-CM

## 2020-03-18 DIAGNOSIS — I6523 Occlusion and stenosis of bilateral carotid arteries: Secondary | ICD-10-CM

## 2020-03-18 MED ORDER — CARVEDILOL 6.25 MG PO TABS: 12.5000 mg | ORAL_TABLET | Freq: Two times a day (BID) | ORAL | 0 refills | Status: DC

## 2020-03-18 MED ORDER — FUROSEMIDE 20 MG PO TABS: 20.0000 mg | ORAL_TABLET | Freq: Every day | ORAL | 0 refills | Status: DC | PRN

## 2020-03-18 MED ORDER — NITROGLYCERIN 0.4 MG SL SUBL: 0.4000 mg | SUBLINGUAL_TABLET | SUBLINGUAL | 3 refills | Status: DC | PRN

## 2020-03-18 NOTE — Chronic Care Management (AMB) (Signed)
Chronic Care Management   CCM RN Visit Note  03/16/2020 Name: Kathryn [name] MRN: [id number] DOB: [birth year]  Subjective: Kathryn [name] is a 80 y.o. year old female who is a primary care patient of Glendale Chard, MD. The care management team was consulted for assistance with disease management and care coordination needs.    Engaged with patient by telephone for follow up visit in response to provider referral for case management and/or care coordination services.   Consent to Services:  The patient was given information about Chronic Care Management services, agreed to services, and gave verbal consent prior to initiation of services.  Please see initial visit note for detailed documentation.   Patient agreed to services and verbal consent obtained.   Assessment: Review of patient past medical history, allergies, medications, health status, including review of consultants reports, laboratory and other test data, was performed as part of comprehensive evaluation and provision of chronic care management services.   SDOH (Social Determinants of Health) assessments and interventions performed:    CCM Care Plan  Allergies  Allergen Reactions  . Codeine Hypertension  . Shellfish Allergy     Outpatient Encounter Medications as of 03/17/2020  Medication Sig  . acetaminophen (TYLENOL) 325 MG tablet Take 325 mg by mouth every 6 (six) hours as needed for mild pain.  [name] ALPRAZolam (XANAX) 0.25 MG tablet TAKE (1) TABLET BY MOUTH TWICE DAILY AS NEEDED FOR ANXIETY.  [name] amLODipine (NORVASC) 10 MG tablet TAKE ONE TABLET BY MOUTH ONCE DAILY.  [name] CALCIUM PO Take 1 tablet by mouth daily. 600 mg  . carvedilol (COREG) 6.25 MG tablet TAKE ONE TABLET BY MOUTH 2 TIMES A DAY  . cetirizine (ZYRTEC) 10 MG tablet Take 10 mg by mouth at bedtime.  . ferrous sulfate 324 MG TBEC Take 324 mg by mouth every other day.  . furosemide (LASIX) 20 MG tablet TAKE 1 TABLET BY MOUTH DAILY  . Investigational -  Study Medication Take 1 tablet by mouth daily. Study name:[id number] Additional study details:For cholesterol (Dr. Skip Estimable)  . levocetirizine (XYZAL) 5 MG tablet Take 5 mg by mouth every evening.  . Multiple Vitamin (MULTIVITAMIN WITH MINERALS) TABS Take 1 tablet by mouth daily.  . nitroGLYCERIN (NITROSTAT) 0.4 MG SL tablet Place 1 tablet (0.4 mg total) under the tongue every 5 (five) minutes x 3 doses as needed for chest pain.  . pravastatin (PRAVACHOL) 40 MG tablet TAKE 1 TABLET WEEKLY. (SAME DAY EACH WEEK)  . pregabalin (LYRICA) 75 MG capsule TAKE 1 CAPSULE BY MOUTH THREE TIMES A DAY.  [name] triamcinolone cream (KENALOG) 0.1 % Apply 1 application topically 2 (two) times daily.  . valACYclovir (VALTREX) 1000 MG tablet Take 1 tablet (1,000 mg total) by mouth 3 (three) times daily.   No facility-administered encounter medications on file as of 03/17/2020.    Patient Active Problem List   Diagnosis Date Noted  . Coronary artery disease of native artery of native heart with stable angina pectoris ([place]) 06/07/2018  . Anxiety 01/18/2018  . 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 Chronic renal disease, 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  . Other abnormal glucose 01/15/2018  . 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 NSTEMI (non-ST elevated myocardial infarction) ([place]) 01/14/2018  . Essential hypertension 01/14/2018  . Hyperlipidemia 01/14/2018  . Carotid stenosis, asymptomatic, bilateral 09/06/2012    Conditions to be addressed/monitored:CKD III, Hypertensive nephropathy, prediabetes, NSTEMI, Essential HTN, Spinal stenosis of lumbosacral region  Care Plan : Nonspecific Low Back Pain (Adult)  Updates made by Barb Merino  L, RN since 03/18/2020 12:00 AM    Problem: Chronic Low Back Pain   Priority: High    Goal: Chronic Low Back Pain Managed   Start Date: 03/10/2020  Expected End Date: 04/10/2020  Recent Progress: On track  Priority: High  Note:   Current Barriers:  [name] Knowledge Deficits related to managing  acute/chronic pain . Non-adherence to scheduled provider appointments . Non-adherence to prescribed medication regimen . Difficulty obtaining medications . Chronic Disease Management support and education needs related to chronic pain . Acute Sciatica Pain  Nurse Case Manager Clinical Goal(s):  [name] Over the next 30 days patient will verbalize acceptable level of pain relief and ability to engage in desired activities Interventions:  . Collaboration with Glendale Chard, MD regarding development and update of comprehensive plan of care as evidenced by provider attestation and co-signature . Inter-disciplinary care team collaboration (see longitudinal plan of care) . 1/31/222 Successful call completed with patient . 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 . Determined patient had a successful intervention on 03/12/20 by Dr. Mina Marble via Radiofrequency ablation   . Determined patient is currently experiencing no back pain, expected approximate benefit from this procedure is approximately 3 months  . Determined she is able to ambulate w/o using DME since having this procedure and feels her gait and posture have improved since having this procedure  . Determined patient has a post procedure f/u appointment scheduled, her daughter will provide transportation and accompany her to the appointment  . Discussed plans with patient for ongoing care management follow up and provided patient with direct contact information for care management team Patient Goals/Self Care Activities:  Over the next 30 days, patient will:  . Patient will keep all MD f/u appointments with Dr. Mina Marble for evaluation of her Sciatica pain  . Self-administers medications as prescribed . Attends all scheduled provider appointments . Calls pharmacy for medication refills . Calls provider office for new concerns or  questions  Follow Up Plan: Telephone follow up appointment with care management team member scheduled for: 04/08/20     Plan:Telephone follow up appointment with care management team member scheduled for:  04/08/20  Barb Merino, RN, BSN, CCM Care Management Coordinator [place] [facility]  Direct Phone: [phone]

## 2020-03-18 NOTE — Patient Instructions (Signed)
Goals Addressed      Patient Stated   .  to have less Sciatica pain (pt-stated)        Timeframe:  Short-Term Goal Priority:  High Start Date:  03/10/20                           Expected End Date:   04/10/20  Follow Up Date:  04/08/20  Over the next 90 days, patient will:  .  Patient will keep all MD f/u appointments with Dr. Mina Marble for ongoing evaluation of her Sciatica pain  . Self-administers medications as prescribed . Attends all scheduled provider appointments . Calls pharmacy for medication refills . Calls provider office for new concerns or questions

## 2020-03-30 NOTE — Progress Notes (Deleted)
Primary Physician/Referring:  Glendale Chard, MD  Patient ID: Kathryn [name], female    DOB: 11/24/1940, 80 y.o.   MRN: [id number]  No chief complaint on file.  HPI:    Kathryn [name]  is a 80 y.o. AAFwith CAD s/p PCI to LCx 2009, hypertension, statin intolerant hyperlipidemia, CKD stage 3 and mild bilateral asymptomatic carotid stenosis. She has had an episode of syncope that occurred in May 2020 suggestive of vasovagal episodes, no recurrence.    Coronary Angiography on 01/16/18 that showed no obstructive CAD and patent stents from 2009. Patient is not on statin due to intolerance, she is enrolled in esperion trial.   Patient presents for urgent visit at her request with complaints of chest pain. At last visit with Dr. Einar Gip 08/23/2019 patient was stable and feeling well, therefore she had been recommended for annual follow up. Patient reports 2 episodes of left sided chest pain over the last 1 week. She describes the pain as achy, lasting about 1 minute. There are no identifiable triggers, both episodes have occurred while patient is at rest.  She has not taken sublingual nitroglycerin, as her supply had expired.  She states that with one episode of this pain she noted tingling in the fingers of her right hand.  Otherwise she denies other associated symptoms.  Denies shortness of breath, palpitations, dizziness, syncope, near syncope, leg swelling, orthopnea, PND.  Patient reports she has experienced increased fatigue over the last 2 days as well.  Patient reports home blood pressure readings which are well controlled, averaging 120s/65 mmHg.  ***Patient presents for 2 week follow up of precordial pain and hypertension. At last visit increased carvedilol form 6.25 mg to 12.5 mg twice daily.   ***home BP? And more chest pain?   Past Medical History:  Diagnosis Date  . Anxiety    takes Xanax bid prn  . Bruises easily    pt is on Plavix  . Chronic back pain    buldging disc  .  Chronic neck pain   . Coronary artery disease    1 stent   . Dysphagia   . Headache(784.0)    related to cervical issues  . History of colonic polyps   . Hyperlipidemia    takes Crestor daily  . Hypertension    takes Coreg and Diovan daily  . Osteoporosis    was taking shot 2xyr;but not taking anymore  . Peripheral vascular disease ([place])   . Seasonal allergies    takes Zyrtec nightly   Past Surgical History:  Procedure Laterality Date  . ABDOMINAL HYSTERECTOMY  70's  . BUNIONECTOMY     left foot  . CARDIAC CATHETERIZATION  2009/2011   1 stent placed  . CHOLECYSTECTOMY  2009  . COLONOSCOPY    . ESOPHAGOGASTRODUODENOSCOPY    . LEFT HEART CATH AND CORONARY ANGIOGRAPHY N/A 01/16/2018   Procedure: LEFT HEART CATH AND CORONARY ANGIOGRAPHY;  Surgeon: Nigel Mormon, MD;  Location: [facility];  Service: Cardiovascular;  Laterality: N/A;  . TONSILLECTOMY     at age 50   Social History   Tobacco Use  . Smoking status: Former Smoker    Packs/day: 0.25    Years: 5.00    Pack years: 1.25    Quit date: 11/27/1968    Years since quitting: 51.3  . Smokeless tobacco: Never Used  Substance Use Topics  . Alcohol use: No   Marital status: Married  ROS  Review of Systems  Constitutional:  Negative for chills, decreased appetite, malaise/fatigue and weight gain.  Cardiovascular: Positive for chest pain (left sided post herpetic and chronic, with increased frequency ) and dyspnea on exertion (stable). Negative for leg swelling and syncope.  Endocrine: Negative for cold intolerance.  Hematologic/Lymphatic: Does not bruise/bleed easily.  Musculoskeletal: Negative for back pain and joint swelling.  Gastrointestinal: Negative for abdominal pain, anorexia, change in bowel habit, melena and nausea.  Neurological: Negative for headaches and light-headedness.  Psychiatric/Behavioral: Negative for depression and substance abuse.  All other systems reviewed and are  negative.  Objective   Vitals with BMI 03/18/2020 02/13/2020 01/04/2020  Height 5\' 2"  5' 2.2" 5\' 2"   Weight 227 lbs 218 lbs 10 oz 218 lbs 4 oz  BMI 41.51 06.23 76.28  Systolic 315 176 160  Diastolic 58 78 74  Pulse 74 86 82      Physical Exam Constitutional:      Comments: Moderately built and moderately obese in no acute distress.  HENT:     Head: Atraumatic.  Neck:     Thyroid: No thyromegaly.     Comments: Short neck Cardiovascular:     Rate and Rhythm: Normal rate and regular rhythm.     Pulses: Intact distal pulses.          Carotid pulses are on the right side with bruit and on the left side with bruit.    Heart sounds: Murmur heard.   Harsh midsystolic murmur is present with a grade of 3/6 at the upper right sternal border radiating to the neck. No gallop.      Comments: No edema. No JVD Pulmonary:     Effort: Pulmonary effort is normal.     Breath sounds: Normal breath sounds.  Abdominal:     Comments: Obese with mild pannus  Musculoskeletal:        General: Normal range of motion.     Cervical back: Neck supple.     Right lower leg: No edema.     Left lower leg: No edema.  Skin:    General: Skin is warm and dry.     Capillary Refill: Capillary refill takes less than 2 seconds.  Neurological:     Mental Status: She is alert and oriented to person, place, and time.    Laboratory examination:    Recent Labs    04/23/19 1134 12/30/19 1235  NA 142 144  K 4.8 4.6  CL 104 104  CO2 22 24  GLUCOSE 87 87  BUN 17 20  CREATININE 1.53* 1.28*  CALCIUM 10.1 9.5  GFRNONAA 32* 40*  GFRAA 37* 46*   CrCl cannot be calculated (Patient's most recent lab result is older than the maximum 21 days allowed.).  CMP Latest Ref Rng & Units 12/30/2019 04/23/2019 01/22/2019  Glucose 65 - 99 mg/dL 87 87 85  BUN 8 - 27 mg/dL 20 17 29(H)  Creatinine 0.57 - 1.00 mg/dL 1.28(H) 1.53(H) 1.33(H)  Sodium 134 - 144 mmol/L 144 142 140  Potassium 3.5 - 5.2 mmol/L 4.6 4.8 4.7  Chloride  96 - 106 mmol/L 104 104 103  CO2 20 - 29 mmol/L 24 22 22   Calcium 8.7 - 10.3 mg/dL 9.5 10.1 10.2  Total Protein 6.0 - 8.5 g/dL - 7.2 6.9  Total Bilirubin 0.0 - 1.2 mg/dL - 0.4 0.3  Alkaline Phos 39 - 117 IU/L - 99 92  AST 0 - 40 IU/L - 18 20  ALT 0 - 32 IU/L - 11 14  CBC Latest Ref Rng & Units 04/23/2019 01/22/2019 06/20/2018  WBC 3.4 - 10.8 x10E3/uL 8.4 9.3 7.5  Hemoglobin 11.1 - 15.9 g/dL 12.5 11.0(L) 11.0(L)  Hematocrit 34.0 - 46.6 % 40.4 35.8 35.1  Platelets 150 - 450 x10E3/uL 463(H) 405 429   Lipid Panel     Component Value Date/Time   CHOL 185 02/13/2020 1602   TRIG 88 02/13/2020 1602   HDL 62 02/13/2020 1602   CHOLHDL 3.0 02/13/2020 1602   LDLCALC 107 (H) 02/13/2020 1602   HEMOGLOBIN A1C Lab Results  Component Value Date   HGBA1C 5.6 12/30/2019   TSH No results for input(s): TSH in the last 8760 hours. Medications and allergies   Allergies  Allergen Reactions  . Codeine Hypertension  . Shellfish Allergy      Current Outpatient Medications  Medication Instructions  . acetaminophen (TYLENOL) 325 mg, Oral, Every 6 hours PRN  . ALPRAZolam (XANAX) 0.25 MG tablet TAKE (1) TABLET BY MOUTH TWICE DAILY AS NEEDED FOR ANXIETY.  [name] amLODipine (NORVASC) 10 MG tablet TAKE ONE TABLET BY MOUTH ONCE DAILY.  [name] Aspirin 81 MG CAPS No dose, route, or frequency recorded.  [name] CALCIUM PO 1 tablet, Oral, Daily, 600 mg  . carvedilol (COREG) 12.5 mg, Oral, 2 times daily with meals  . cetirizine (ZYRTEC) 10 mg, Daily at bedtime  . ferrous sulfate 324 mg, Oral, Every other day  . furosemide (LASIX) 20 mg, Oral, Daily PRN  . Investigational - Study Medication 1 tablet, Oral, Daily, Study name:008Additional study details:For cholesterol (Dr. Skip Estimable)   . levocetirizine (XYZAL) 5 mg, Oral, Every evening  . Multiple Vitamin (MULTIVITAMIN WITH MINERALS) TABS 1 tablet, Oral, Daily  . nitroGLYCERIN (NITROSTAT) 0.4 mg, Sublingual, Every 5 min x3 PRN  . pravastatin (PRAVACHOL) 40 MG tablet TAKE 1  TABLET WEEKLY. (SAME DAY EACH WEEK)  . pregabalin (LYRICA) 75 MG capsule TAKE 1 CAPSULE BY MOUTH THREE TIMES A DAY.  [name] triamcinolone cream (KENALOG) 0.1 % 1 application, Topical, 2 times daily  . valACYclovir (VALTREX) 1,000 mg, Oral, 3 times daily    Radiology:  No results found.   Cardiac Studies:   Sleep Study  [2011]: Negative for sleep apnea  Coronary Angiogram   01/16/18: Patent Sypher stent in Cx, 2.75 x 15 mm DES placed on 01/22/08. Normal LVEF. NO other significant CAD.  Echocardiogram 12/19/2018: Left ventricle cavity is normal in size. Moderate concentric hypertrophy of the left ventricle. Normal LV systolic function with EF 67%. Normal global wall motion. Doppler evidence of grade I (impaired) diastolic dysfunction, normal LAP.  Left atrial cavity is mildly dilated. Trileaflet aortic valve. Mild aortic valve leaflet calcification. Mild aortic valve stenosis. Aortic valve mean gradient of 10 mmHg, Vmax of 2.1 m/s. Calculated aortic valve area by continuity equation is 1.2 cm. Moderate (Grade II) aortic regurgitation. Mildly restricted mitral valve leaflets. Moderate (Grade II) mitral regurgitation. Mild tricuspid regurgitation. Estimated pulmonary artery systolic pressure is 39 mmHg.  No significant change compared to previous study on 01/15/2018.  Carotid artery duplex 11/26/2019:  Stenosis in the right internal carotid artery (50-69%). Stenosis in the right external carotid artery (<50%).  Stenosis in the left internal carotid artery (16-49%). Stenosis in the left external carotid artery (<50%).  Antegrade right vertebral artery flow. Antegrade left vertebral artery flow.  No significant change from 05/29/2019. Follow up in six months is appropriate if clinically indicated.   EKG   EKG 03/18/2020: Sinus rhythm rate of 72 bpm, left atrial enlargement. Normal axis. Poor  R wave progression, cannot exclude anteroseptal infarct old. Compared to EKG 11/28/2018, no significant  change.  EKG 08/23/2019: Normal sinus rhythm with rate of 75 bpm, normal axis.  No evidence of ischemia, normal EKG.   Assessment     ICD-10-CM   1. Essential hypertension  I10   2. Precordial pain  R07.2   3. Post herpetic neuralgia  B02.29     No orders of the defined types were placed in this encounter.  There are no discontinued medications.  Recommendations:    Kathryn [name]  is a 80 y.o. 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 She has had an episode of syncope that occurred in May 2020 suggestiv of vasovagal episodes, no recurrence.    Coronary Angiography on 01/16/18 that showed no obstructive CAD and patent stents from 2009. Patient is not on statin due to intolerance, she is enrolled in esperion trial.   Presents for urgent visit with complaints of intermittent nonexertional chest pain lasting less than 1 minute. Patient does have chronic left-sided post herpetic chest, however she reports increased frequency of chest discomfort which she describes as pressure. Patient's blood pressure is also elevated in the office today. EKG today was without signs of ischemia or underlying injury pattern, unchanged from previous. In view of increased frequency of chest pressure symptoms as well as elevated blood pressure will increase carvedilol from 6.25mg  to 12.5 mg twice daily. Patient will continue to monitor blood pressure at home and bring with her a written log to her next visit. Notably patient reports home blood pressure readings are well controlled, however there are no readings for me to review today. I have also refilled patient's sublingual nitroglycerin. S/L NTG was prescribed and explained how to and when to use it and to notify us if there is change in frequency of use.   Counseled patient to notify our office or go to the emergency department for further evaluation if she  experiences worsening symptoms. Patient verbalized understanding and agreement.  Follow-up in 2 weeks, sooner if needed, for precordial pain.  ***Patient presents for 2 week follow up of precordial pain and hypertension.   ***

## 2020-03-31 ENCOUNTER — Ambulatory Visit: Payer: Medicare Other | Admitting: Student

## 2020-03-31 DIAGNOSIS — B0229 Other postherpetic nervous system involvement: Secondary | ICD-10-CM

## 2020-03-31 DIAGNOSIS — I10 Essential (primary) hypertension: Secondary | ICD-10-CM

## 2020-03-31 DIAGNOSIS — R072 Precordial pain: Secondary | ICD-10-CM

## 2020-04-02 NOTE — Progress Notes (Signed)
Primary Physician/Referring:  Glendale Chard, MD  Patient ID: Kathryn [name], female    DOB: 1940/10/03, 80 y.o.   MRN: [id number]  Chief Complaint  Patient presents with  . precordial  . Coronary Artery Disease  . Follow-up    2 week   HPI:    Kathryn [name]  is a 80 y.o. AAFwith CAD s/p PCI to LCx 2009, hypertension, statin intolerant hyperlipidemia, CKD stage 3 and mild bilateral asymptomatic carotid stenosis. She has had an episode of syncope that occurred in May 2020 suggestive of vasovagal episodes, no recurrence. Coronary Angiography on 01/16/18 that showed no obstructive CAD and patent stents from 2009. Patient is not on statin due to intolerance, she is enrolled in esperion trial. Patient does have history of chronic left-sided post herpetic chest pain.  Patient presents for 2 week follow up of precordial pain and hypertension. At last visit increased carvedilol form 6.25 mg to 12.5 mg twice daily. Patient is tolerating increase carvedilol dosing well without issue. She reports since last visit she has had a single episode of left-sided chest pain lasting less than 1 minute, which she states is similar to her neck postherpetic chest pain. She reports home blood pressures are well controlled averaging 128/60 mmHg. Denies palpitations, dizziness, dyspnea, syncope, near syncope, orthopnea, PND. She does have intermittent mild bilateral lower leg edema. Patient reports her fatigue has also resolved since last visit.    Past Medical History:  Diagnosis Date  . Anxiety    takes Xanax bid prn  . Bruises easily    pt is on Plavix  . Chronic back pain    buldging disc  . Chronic neck pain   . Coronary artery disease    1 stent   . Dysphagia   . Headache(784.0)    related to cervical issues  . History of colonic polyps   . Hyperlipidemia    takes Crestor daily  . Hypertension    takes Coreg and Diovan daily  . Osteoporosis    was taking shot 2xyr;but not taking anymore   . Peripheral vascular disease ([place])   . Seasonal allergies    takes Zyrtec nightly   Past Surgical History:  Procedure Laterality Date  . ABDOMINAL HYSTERECTOMY  70's  . BUNIONECTOMY     left foot  . CARDIAC CATHETERIZATION  2009/2011   1 stent placed  . CHOLECYSTECTOMY  2009  . COLONOSCOPY    . ESOPHAGOGASTRODUODENOSCOPY    . LEFT HEART CATH AND CORONARY ANGIOGRAPHY N/A 01/16/2018   Procedure: LEFT HEART CATH AND CORONARY ANGIOGRAPHY;  Surgeon: Nigel Mormon, MD;  Location: [facility];  Service: Cardiovascular;  Laterality: N/A;  . TONSILLECTOMY     at age 3   Social History   Tobacco Use  . Smoking status: Former Smoker    Packs/day: 0.25    Years: 5.00    Pack years: 1.25    Quit date: 11/27/1968    Years since quitting: 51.3  . Smokeless tobacco: Never Used  Substance Use Topics  . Alcohol use: No   Marital status: Married  ROS  Review of Systems  Constitutional: Negative for chills, decreased appetite, malaise/fatigue and weight gain.  Cardiovascular: Positive for chest pain (left sided post herpetic and chronic, single episode) and dyspnea on exertion (stable). Negative for claudication, leg swelling, near-syncope, orthopnea, palpitations, paroxysmal nocturnal dyspnea and syncope.  Respiratory: Negative for shortness of breath.   Endocrine: Negative for cold intolerance.  Hematologic/Lymphatic: Does not  bruise/bleed easily.  Musculoskeletal: Negative for back pain and joint swelling.  Gastrointestinal: Negative for abdominal pain, anorexia, change in bowel habit, melena and nausea.  Neurological: Negative for dizziness, headaches, light-headedness and weakness.  Psychiatric/Behavioral: Negative for depression and substance abuse.  All other systems reviewed and are negative.  Objective   Vitals with BMI 04/03/2020 03/18/2020 02/13/2020  Height 5\' 2"  5\' 2"  5' 2.2"  Weight 225 lbs 227 lbs 218 lbs 10 oz  BMI 41.14 10.27 25.36  Systolic 644 034 742   Diastolic 61 58 78  Pulse 83 74 86      Physical Exam Constitutional:      Comments: Moderately built and moderately obese in no acute distress.  HENT:     Head: Atraumatic.  Neck:     Thyroid: No thyromegaly.     Comments: Short neck Cardiovascular:     Rate and Rhythm: Normal rate and regular rhythm.     Pulses: Intact distal pulses.          Carotid pulses are on the right side with bruit and on the left side with bruit.    Heart sounds: Murmur heard.   Harsh midsystolic murmur is present with a grade of 3/6 at the upper right sternal border radiating to the neck. No gallop.      Comments: No edema. No JVD Pulmonary:     Effort: Pulmonary effort is normal.     Breath sounds: Normal breath sounds.  Abdominal:     Comments: Obese with mild pannus  Musculoskeletal:        General: Normal range of motion.     Cervical back: Neck supple.     Right lower leg: Edema (trace) present.     Left lower leg: Edema (trace) present.  Skin:    General: Skin is warm and dry.     Capillary Refill: Capillary refill takes less than 2 seconds.  Neurological:     Mental Status: She is alert and oriented to person, place, and time.    Laboratory examination:    Recent Labs    04/23/19 1134 12/30/19 1235  NA 142 144  K 4.8 4.6  CL 104 104  CO2 22 24  GLUCOSE 87 87  BUN 17 20  CREATININE 1.53* 1.28*  CALCIUM 10.1 9.5  GFRNONAA 32* 40*  GFRAA 37* 46*   CrCl cannot be calculated (Patient's most recent lab result is older than the maximum 21 days allowed.).  CMP Latest Ref Rng & Units 12/30/2019 04/23/2019 01/22/2019  Glucose 65 - 99 mg/dL 87 87 85  BUN 8 - 27 mg/dL 20 17 29(H)  Creatinine 0.57 - 1.00 mg/dL 1.28(H) 1.53(H) 1.33(H)  Sodium 134 - 144 mmol/L 144 142 140  Potassium 3.5 - 5.2 mmol/L 4.6 4.8 4.7  Chloride 96 - 106 mmol/L 104 104 103  CO2 20 - 29 mmol/L 24 22 22   Calcium 8.7 - 10.3 mg/dL 9.5 10.1 10.2  Total Protein 6.0 - 8.5 g/dL - 7.2 6.9  Total Bilirubin 0.0 - 1.2  mg/dL - 0.4 0.3  Alkaline Phos 39 - 117 IU/L - 99 92  AST 0 - 40 IU/L - 18 20  ALT 0 - 32 IU/L - 11 14   CBC Latest Ref Rng & Units 04/23/2019 01/22/2019 06/20/2018  WBC 3.4 - 10.8 x10E3/uL 8.4 9.3 7.5  Hemoglobin 11.1 - 15.9 g/dL 12.5 11.0(L) 11.0(L)  Hematocrit 34.0 - 46.6 % 40.4 35.8 35.1  Platelets 150 - 450 x10E3/uL 463(H) 405  429   Lipid Panel     Component Value Date/Time   CHOL 185 02/13/2020 1602   TRIG 88 02/13/2020 1602   HDL 62 02/13/2020 1602   CHOLHDL 3.0 02/13/2020 1602   LDLCALC 107 (H) 02/13/2020 1602   HEMOGLOBIN A1C Lab Results  Component Value Date   HGBA1C 5.6 12/30/2019   TSH No results for input(s): TSH in the last 8760 hours. Medications and allergies   Allergies  Allergen Reactions  . Codeine Hypertension  . Shellfish Allergy      Current Outpatient Medications  Medication Instructions  . acetaminophen (TYLENOL) 325 mg, Oral, Every 6 hours PRN  . ALPRAZolam (XANAX) 0.25 MG tablet TAKE (1) TABLET BY MOUTH TWICE DAILY AS NEEDED FOR ANXIETY.  [name] amLODipine (NORVASC) 10 MG tablet TAKE ONE TABLET BY MOUTH ONCE DAILY.  [name] Aspirin 81 MG CAPS No dose, route, or frequency recorded.  [name] CALCIUM PO 1 tablet, Oral, Daily, 600 mg  . carvedilol (COREG) 12.5 mg, Oral, 2 times daily with meals  . cetirizine (ZYRTEC) 10 mg, Daily at bedtime  . ferrous sulfate 324 mg, Oral, Every other day  . furosemide (LASIX) 20 mg, Oral, Daily PRN  . Investigational - Study Medication 1 tablet, Oral, Daily, Study name:008Additional study details:For cholesterol (Dr. Skip Estimable)   . levocetirizine (XYZAL) 5 mg, Oral, Every evening  . Multiple Vitamin (MULTIVITAMIN WITH MINERALS) TABS 1 tablet, Oral, Daily  . nitroGLYCERIN (NITROSTAT) 0.4 mg, Sublingual, Every 5 min x3 PRN  . pravastatin (PRAVACHOL) 40 MG tablet TAKE 1 TABLET WEEKLY. (SAME DAY EACH WEEK)  . pregabalin (LYRICA) 75 MG capsule TAKE 1 CAPSULE BY MOUTH THREE TIMES A DAY.  [name] triamcinolone cream (KENALOG) 0.1 % 1 application,  Topical, 2 times daily  . valACYclovir (VALTREX) 1,000 mg, Oral, 3 times daily    Radiology:  No results found.   Cardiac Studies:   Sleep Study  [2011]: Negative for sleep apnea  Coronary Angiogram   01/16/18: Patent Sypher stent in Cx, 2.75 x 15 mm DES placed on 01/22/08. Normal LVEF. NO other significant CAD.  Echocardiogram 12/19/2018: Left ventricle cavity is normal in size. Moderate concentric hypertrophy of the left ventricle. Normal LV systolic function with EF 67%. Normal global wall motion. Doppler evidence of grade I (impaired) diastolic dysfunction, normal LAP.  Left atrial cavity is mildly dilated. Trileaflet aortic valve. Mild aortic valve leaflet calcification. Mild aortic valve stenosis. Aortic valve mean gradient of 10 mmHg, Vmax of 2.1 m/s. Calculated aortic valve area by continuity equation is 1.2 cm. Moderate (Grade II) aortic regurgitation. Mildly restricted mitral valve leaflets. Moderate (Grade II) mitral regurgitation. Mild tricuspid regurgitation. Estimated pulmonary artery systolic pressure is 39 mmHg.  No significant change compared to previous study on 01/15/2018.  Carotid artery duplex 11/26/2019:  Stenosis in the right internal carotid artery (50-69%). Stenosis in the right external carotid artery (<50%).  Stenosis in the left internal carotid artery (16-49%). Stenosis in the left external carotid artery (<50%).  Antegrade right vertebral artery flow. Antegrade left vertebral artery flow.  No significant change from 05/29/2019. Follow up in six months is appropriate if clinically indicated.   EKG   EKG 04/03/2020: Sinus rhythm at a rate of 79 bpm, left atrial enlargement. Left axis, left anterior fascicular block. Poor R wave progression, cannot exclude anteroseptal infarct old. No evidence of underlying ischemia or injury pattern. Compared to EKG 03/18/2020, left axis and left anterior fascicular block noted.  EKG 03/18/2020: Sinus rhythm rate of 72 bpm, left  atrial enlargement. Normal axis. Poor R wave progression, cannot exclude anteroseptal infarct old. Compared to EKG 11/28/2018, no significant change.  EKG 08/23/2019: Normal sinus rhythm with rate of 75 bpm, normal axis.  No evidence of ischemia, normal EKG.   Assessment     ICD-10-CM   1. Precordial pain  R07.2 EKG 12-Lead  2. Essential hypertension  I10   3. Coronary artery disease of native artery of native heart with stable angina pectoris (HCC)  I25.118 carvedilol (COREG) 12.5 MG tablet    Lipid Panel With LDL/HDL Ratio    Lipid Panel With LDL/HDL Ratio  4. Pure hypercholesterolemia  E78.00 Lipid Panel With LDL/HDL Ratio    Lipid Panel With LDL/HDL Ratio    Meds ordered this encounter  Medications  . carvedilol (COREG) 12.5 MG tablet    Sig: Take 1 tablet (12.5 mg total) by mouth 2 (two) times daily with a meal.    Dispense:  180 tablet    Refill:  3   Medications Discontinued During This Encounter  Medication Reason  . carvedilol (COREG) 6.25 MG tablet Reorder    Recommendations:    NELVA HAUK  is a 80 y.o. 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 She has had an episode of syncope that occurred in May 2020 suggestiv of vasovagal episodes, no recurrence.    Coronary Angiography on 01/16/18 that showed no obstructive CAD and patent stents from 2009. Patient is not on statin due to intolerance, she is enrolled in esperion trial.   Patient presents for 2 week follow up of precordial pain and hypertension. At last visit carvedilol was increased to 12.5 mg twice daily, patient's blood pressure is not well controlled. She has only had 1 episode of chest pain lasting less than 1 minute since last visit which she reports is similar to chronic left-sided postherpetic pain. We will continue present cardiovascular medications.  I personally reviewed external labs, LDL is above goal  at 107. Discussed with patient regarding importance of diet and lifestyle modifications in order to improve lipid control and reduce overall cardiovascular risk. Discussed with her option of adding Zetia to pravastatin in order to improve lipid control. Patient prefers to attempt to make diet and lifestyle modifications. We will repeat lipid profile testing in 1 month, will consider addition of Zetia if needed at that time.  Patient does have new left anterior fascicular block noted on EKG today. Patient is asymptomatic therefore recommend watchful waiting at this time. She is overall stable from a cardiovascular standpoint. She is also due for carotid artery surveillance, will schedule for carotid duplex prior to next office visit.  Follow-up in 6 months, sooner if needed, for hypertension, hyperlipidemia, and CAD.   Alethia Berthold, PA-C 04/03/2020, 1:39 PM Office: [phone]

## 2020-04-03 ENCOUNTER — Ambulatory Visit: Payer: Medicare Other | Admitting: Student

## 2020-04-03 ENCOUNTER — Encounter: Payer: Self-pay | Admitting: Student

## 2020-04-03 ENCOUNTER — Other Ambulatory Visit: Payer: Self-pay

## 2020-04-03 VITALS — BP 136/61 | HR 83 | Temp 98.2°F | Resp 16 | Ht 62.0 in | Wt 225.0 lb

## 2020-04-03 DIAGNOSIS — I25118 Atherosclerotic heart disease of native coronary artery with other forms of angina pectoris: Secondary | ICD-10-CM

## 2020-04-03 DIAGNOSIS — E7800 Pure hypercholesterolemia, unspecified: Secondary | ICD-10-CM | POA: Diagnosis not present

## 2020-04-03 DIAGNOSIS — I10 Essential (primary) hypertension: Secondary | ICD-10-CM

## 2020-04-03 DIAGNOSIS — R072 Precordial pain: Secondary | ICD-10-CM

## 2020-04-03 MED ORDER — CARVEDILOL 12.5 MG PO TABS: 12.5000 mg | ORAL_TABLET | Freq: Two times a day (BID) | ORAL | 3 refills | Status: DC

## 2020-04-03 NOTE — Patient Instructions (Signed)
Get blood work done in 1 month at [facility].

## 2020-04-07 ENCOUNTER — Other Ambulatory Visit: Payer: Self-pay | Admitting: Internal Medicine

## 2020-04-07 DIAGNOSIS — B0229 Other postherpetic nervous system involvement: Secondary | ICD-10-CM

## 2020-04-07 MED ORDER — PREGABALIN 75 MG PO CAPS: ORAL_CAPSULE | ORAL | 4 refills | Status: DC

## 2020-04-08 ENCOUNTER — Ambulatory Visit: Payer: Self-pay

## 2020-04-08 ENCOUNTER — Telehealth: Payer: Medicare Other

## 2020-04-08 DIAGNOSIS — I10 Essential (primary) hypertension: Secondary | ICD-10-CM | POA: Diagnosis not present

## 2020-04-08 DIAGNOSIS — N183 Chronic kidney disease, stage 3 unspecified: Secondary | ICD-10-CM

## 2020-04-08 DIAGNOSIS — M4807 Spinal stenosis, lumbosacral region: Secondary | ICD-10-CM

## 2020-04-08 DIAGNOSIS — I214 Non-ST elevation (NSTEMI) myocardial infarction: Secondary | ICD-10-CM | POA: Diagnosis not present

## 2020-04-08 DIAGNOSIS — R7303 Prediabetes: Secondary | ICD-10-CM

## 2020-04-08 DIAGNOSIS — I1310 Hypertensive heart and chronic kidney disease without heart failure, with stage 1 through stage 4 chronic kidney disease, or unspecified chronic kidney disease: Secondary | ICD-10-CM | POA: Diagnosis not present

## 2020-04-09 ENCOUNTER — Telehealth: Payer: Self-pay

## 2020-04-09 NOTE — Patient Instructions (Addendum)
Goals Addressed    .  to have less Sciatica pain (pt-stated)   On track     Timeframe:  Short-Term Goal Priority:  High Start Date:  03/10/20                           Expected End Date:  06/02/20  Follow Up Date: 06/02/20  Over the next 180 days, patient will:  .  Patient will keep all MD f/u appointments with Dr. Mina Marble for ongoing evaluation of her Sciatica pain  . Self-administers medications as prescribed . Attends all scheduled provider appointments . Calls pharmacy for medication refills . Calls provider office for new concerns or questions                        Other   .  Hypertension - Disease Progression Prevented or Minimized   On track     Timeframe:  Long-Range Goal Priority:  High Start Date:  04/08/20                           Expected End Date:  10/06/20    Next Follow Up Date: 06/02/20  Patient Goals/Self Care Activities:  . Self administer medications as prescribed . Attend all scheduled provider appointments . Call pharmacy for medication refills . Call provider office for new concerns or questions . Continue to monitor BP at home and record as discussed . Continue to adhere to dietary and exercise recommendations

## 2020-04-09 NOTE — Chronic Care Management (AMB) (Signed)
Chronic Care Management   CCM RN Visit Note  04/08/2020 Name: Kathryn [name] MRN: [id number] DOB: December 27, 1940  Subjective: Kathryn [name] is a 80 y.o. year old female who is a primary care patient of Glendale Chard, MD. The care management team was consulted for assistance with disease management and care coordination needs.    Engaged with patient by telephone for follow up visit in response to provider referral for case management and/or care coordination services.   Consent to Services:  The patient was given information about Chronic Care Management services, agreed to services, and gave verbal consent prior to initiation of services.  Please see initial visit note for detailed documentation.   Patient agreed to services and verbal consent obtained.   Assessment: Review of patient past medical history, allergies, medications, health status, including review of consultants reports, laboratory and other test data, was performed as part of comprehensive evaluation and provision of chronic care management services.   SDOH (Social Determinants of Health) assessments and interventions performed:    CCM Care Plan  Allergies  Allergen Reactions  . Codeine Hypertension  . Shellfish Allergy     Outpatient Encounter Medications as of 04/08/2020  Medication Sig  . acetaminophen (TYLENOL) 325 MG tablet Take 325 mg by mouth every 6 (six) hours as needed for mild pain.  [name] ALPRAZolam (XANAX) 0.25 MG tablet TAKE (1) TABLET BY MOUTH TWICE DAILY AS NEEDED FOR ANXIETY.  [name] amLODipine (NORVASC) 10 MG tablet TAKE ONE TABLET BY MOUTH ONCE DAILY.  [name] Aspirin 81 MG CAPS   . CALCIUM PO Take 1 tablet by mouth daily. 600 mg  . carvedilol (COREG) 12.5 MG tablet Take 1 tablet (12.5 mg total) by mouth 2 (two) times daily with a meal.  . cetirizine (ZYRTEC) 10 MG tablet Take 10 mg by mouth at bedtime.  . ferrous sulfate 324 MG TBEC Take 324 mg by mouth every other day.  . furosemide (LASIX) 20 MG  tablet Take 1 tablet (20 mg total) by mouth daily as needed for fluid.  . Investigational - Study Medication Take 1 tablet by mouth daily. Study name:008 Additional study details:For cholesterol (Dr. Skip Estimable)  . levocetirizine (XYZAL) 5 MG tablet Take 5 mg by mouth every evening.  . Multiple Vitamin (MULTIVITAMIN WITH MINERALS) TABS Take 1 tablet by mouth daily.  . nitroGLYCERIN (NITROSTAT) 0.4 MG SL tablet Place 1 tablet (0.4 mg total) under the tongue every 5 (five) minutes x 3 doses as needed for chest pain.  . pravastatin (PRAVACHOL) 40 MG tablet TAKE 1 TABLET WEEKLY. (SAME DAY EACH WEEK)  . pregabalin (LYRICA) 75 MG capsule TAKE 1 CAPSULE BY MOUTH THREE TIMES A DAY.  [name] triamcinolone cream (KENALOG) 0.1 % Apply 1 application topically 2 (two) times daily.  . valACYclovir (VALTREX) 1000 MG tablet Take 1 tablet (1,000 mg total) by mouth 3 (three) times daily.   No facility-administered encounter medications on file as of 04/08/2020.    Patient Active Problem List   Diagnosis Date Noted  . Coronary artery disease of native artery of native heart with stable angina pectoris ([place]) 06/07/2018  . Anxiety 01/18/2018  . 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 Chronic renal disease, 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  . Other abnormal glucose 01/15/2018  . 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 NSTEMI (non-ST elevated myocardial infarction) ([place]) 01/14/2018  . Essential hypertension 01/14/2018  . Hyperlipidemia 01/14/2018  . Carotid stenosis, asymptomatic, bilateral 09/06/2012    Conditions to be addressed/monitored:CKD III, Hypertensive nephropathy, prediabetes, NSTEMI, Essential HTN,  Spinal stenosis of lumbosacral region  Care Plan : Nonspecific Low Back Pain (Adult)  Updates made by Lynne Logan, RN since 04/09/2020 12:00 AM    Problem: Chronic Low Back Pain   Priority: High    Long-Range Goal: Chronic Low Back Pain Managed   Start Date: 03/10/2020  Expected End Date: 09/07/2020  Recent Progress: On  track  Priority: High  Note:   Current Barriers:  [name] Knowledge Deficits related to managing acute/chronic pain . Non-adherence to scheduled provider appointments . Non-adherence to prescribed medication regimen . Difficulty obtaining medications . Chronic Disease Management support and education needs related to chronic pain . Acute Sciatica Pain  Nurse Case Manager Clinical Goal(s):  [name] Over the next 180 days patient will verbalize acceptable level of pain relief and ability to engage in desired activities Interventions:  04/08/20 Successful call completed with patient . Collaboration with Glendale Chard, MD regarding development and update of comprehensive plan of care as evidenced by provider attestation and co-signature . Inter-disciplinary care team collaboration (see longitudinal plan of care) . 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 . Determined patient had a successful intervention on 03/12/20 by Dr. Mina Marble via Radiofrequency ablation   . Determined patient is currently experiencing Sciatica pain, she will f/u with Dr. Mina Marble tomorrow on 04/09/20 . Educated patient on the benefits of having PT work with her on safe and effective exercises and stretches she can do long term  . Instructed patient to call her health plan to inquire about copays for outpatient PT; encouraged patient to ask Dr. Mina Marble if PT is okay to start and request a referral . Encouraged patient to continue to shift her weight and use repositioning for pain relief . Discussed plans with patient for ongoing care management follow up and provided patient with direct contact information for care management team Patient Goals/Self Care Activities:  Over the next 180 days, patient will:  . Patient will keep all MD f/u appointments with Dr. Mina Marble for ongoing evaluation of her Sciatica pain  . Self-administer  medications as prescribed . Attend all scheduled provider appointments . Call pharmacy for medication refills . Call provider office for new concerns or questions  Follow Up Plan: Telephone follow up appointment with care management team member scheduled for: 06/02/20    Care Plan : Hypertension (Adult)  Updates made by Lynne Logan, RN since 04/09/2020 12:00 AM    Problem: Disease Progression (Hypertension)     Long-Range Goal: Disease Progression Prevented or Minimized   Start Date: 04/08/2020  Expected End Date: 10/06/2020  This Visit's Progress: On track  Priority: High  Note:   Current Barriers:  [name] Knowledge Deficits related to disease process and Self Health management of HTN . Chronic Disease Management support and education needs related to Essential HTN, CKDIII, Hypertensive nephropathy, prediabetes, NSTEMI  Nurse Case Manager Clinical Goal(s):  [name] Over the next 180 days, patient will work with the [facility] CM and PCP to address needs related to disease education and support to help improve Self Health management of HTN Interventions:  04/08/20 successful call completed with patient  . Evaluation of current treatment plan related to HTN and patient's adherence to plan as established by provider. . Provided education to patient re: education with rationale of importance of sodium restriction and adhering to a heart healthy, low fat diet; Educated patient on target BP <130/80 . Discussed  and reviewed recent follow up with [facility]: o Assessment   o   o   o ICD-10-CM o   o  1. o Precordial pain  o R07.2 o EKG 12-Lead o  2. o Essential hypertension  o I10 o   o  3. o Coronary artery disease of native artery of native heart with stable angina pectoris (HCC)  o I25.118 o carvedilol (COREG) 12.5 MG tablet o    o   o Lipid Panel With LDL/HDL Ratio o    o   o Lipid Panel With LDL/HDL Ratio o  4. o Pure hypercholesterolemia  o E78.00 o Lipid Panel With LDL/HDL Ratio o     o   o Lipid Panel With LDL/HDL Ratio o    o  o  o  o  Meds ordered this encounter o  Medications o  . o carvedilol (COREG) 12.5 MG tablet o    o   o Sig: Take 1 tablet (12.5 mg total) by mouth 2 (two) times daily with a meal. o    o   o Dispense:  180 tablet o    o   o Refill:  3 o    o  o  o  o  Medications Discontinued During This Encounter o  Medication o Reason o  . o carvedilol (COREG) 6.25 MG tablet o Reorder   o Recommendations:   o Kathryn [name]  is a 80 y.o. 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 She has had an episode of syncope that occurred in May 2020 suggestiv of vasovagal episodes, no recurrence.    Coronary Angiography on 01/16/18 that showed no obstructive CAD and patent stents from 2009. Patient is not on statin due to intolerance, she is enrolled in esperion trial.  o Patient presents for 2 week follow up of precordial pain and hypertension. At last visit carvedilol was increased to 12.5 mg twice daily, patient's blood pressure is not well controlled. She has only had 1 episode of chest pain lasting less than 1 minute since last visit which she reports is similar to chronic left-sided postherpetic pain. We will continue present cardiovascular medications. o I personally reviewed external labs, LDL is above goal at 107. Discussed with patient regarding importance of diet and lifestyle modifications in order to improve lipid control and reduce overall cardiovascular risk. Discussed with her option of adding Zetia to pravastatin in order to improve lipid control. Patient prefers to attempt to make diet and lifestyle modifications. We will repeat lipid profile testing in 1 month, will consider addition of Zetia if needed at that time. 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 Patient is asymptomatic therefore recommend watchful waiting at  this time. She is overall stable from a cardiovascular standpoint. She is also due for carotid artery surveillance, will schedule for carotid duplex prior to next office visit. o Follow-up in 6 months, sooner if needed, for hypertension, hyperlipidemia, and CAD. [name] Reviewed medications with patient and confirmed she understands the increased dosage change with Metoprolol 12.5 mg twice daily  . Advised patient, providing education and rationale, to monitor blood pressure daily and record, calling CCM RN and or PCP for findings outside established parameters.  . Confirmed patient received and reviewed printed educational materials related to What is High Blood Pressure?; Why Should I Lower Sodium?; African-Americans and High Blood Pressure; Life's  Simple 7; BP log . Discussed plans with patient for ongoing care management follow up and provided patient with direct contact information for care management team . Reviewed and discussed next PCP OV with Dr. Baird Cancer scheduled for 05/28/20 @11 :00 AM  Patient Self Care Activities:  . Self administer medications as prescribed . Attend all scheduled provider appointments . Call pharmacy for medication refills . Call provider office for new concerns or questions . Continue to monitor BP at home and record as discussed . Continue to adhere to dietary and exercise recommendations Next Follow Up Date: 06/02/20    Plan:Telephone follow up appointment with care management team member scheduled for:  06/02/20  Barb Merino, RN, BSN, CCM Care Management Coordinator [facility]  Direct Phone: [phone]

## 2020-04-10 DIAGNOSIS — M47818 Spondylosis without myelopathy or radiculopathy, sacral and sacrococcygeal region: Secondary | ICD-10-CM | POA: Diagnosis not present

## 2020-04-10 DIAGNOSIS — M7071 Other bursitis of hip, right hip: Secondary | ICD-10-CM | POA: Diagnosis not present

## 2020-04-17 ENCOUNTER — Ambulatory Visit: Payer: Medicare Other

## 2020-04-17 ENCOUNTER — Other Ambulatory Visit: Payer: Self-pay

## 2020-04-17 DIAGNOSIS — I25118 Atherosclerotic heart disease of native coronary artery with other forms of angina pectoris: Secondary | ICD-10-CM | POA: Diagnosis not present

## 2020-04-17 DIAGNOSIS — I6523 Occlusion and stenosis of bilateral carotid arteries: Secondary | ICD-10-CM | POA: Diagnosis not present

## 2020-04-17 DIAGNOSIS — E7800 Pure hypercholesterolemia, unspecified: Secondary | ICD-10-CM | POA: Diagnosis not present

## 2020-04-18 LAB — LIPID PANEL WITH LDL/HDL RATIO
Cholesterol, Total: 211 mg/dL — ABNORMAL HIGH (ref 100–199)
HDL: 50 mg/dL (ref >39)
LDL Chol Calc (NIH): 146 mg/dL — ABNORMAL HIGH (ref 0–99)
LDL/HDL Ratio: 2.9 ratio (ref 0.0–3.2)
Triglycerides: 86 mg/dL (ref 0–149)
VLDL Cholesterol Cal: 15 mg/dL (ref 5–40)

## 2020-04-19 NOTE — Progress Notes (Signed)
Please inform patient her cholesterol is significantly elevated above goal at 146, which is increased since 2 months ago from 107.  Recommend adding Zetia, which we talked about at our last visit and continuing pravastatin.  Please notify me if patient is willing to starting Zetia 10 mg once daily and I will order.

## 2020-04-20 ENCOUNTER — Other Ambulatory Visit: Payer: Self-pay | Admitting: Student

## 2020-04-20 DIAGNOSIS — E7800 Pure hypercholesterolemia, unspecified: Secondary | ICD-10-CM

## 2020-04-20 MED ORDER — EZETIMIBE 10 MG PO TABS
10.0000 mg | ORAL_TABLET | Freq: Every day | ORAL | 11 refills | Status: DC
Start: 2020-04-20 — End: 2021-05-10

## 2020-04-20 NOTE — Progress Notes (Signed)
Called pt to inform her about her lab results. Pt understood and agrees to start on zetia

## 2020-04-20 NOTE — Progress Notes (Signed)
I have sent prescription. Please advise patient to have repeat lipid panel done at [facility] in 3 months.

## 2020-04-20 NOTE — Progress Notes (Signed)
Called pt to inform her about the message above. Pt understood.

## 2020-04-21 ENCOUNTER — Other Ambulatory Visit: Payer: Self-pay | Admitting: Internal Medicine

## 2020-04-21 NOTE — Telephone Encounter (Signed)
Xanax refill 

## 2020-04-22 NOTE — Progress Notes (Unsigned)
Patient with bilateral carotid artery stenosis, right ECA stenosis appears to have progressed.  Stenosis diastolic velocity appears to be in the 70% range and not 80% range however clinical correlation is indicated.  We can continue to monitor this and repeat study in 6 months prior to her next office visit in August 2022.    ICD-10-CM   1. Carotid stenosis, asymptomatic, bilateral  I65.23 PCV CAROTID DUPLEX (BILATERAL)    PCV CAROTID DUPLEX (BILATERAL)     Adrian Prows, MD, [facility] 04/22/2020, 1:17 PM Office: [phone] Pager: [phone]

## 2020-04-22 NOTE — Progress Notes (Signed)
Called and spoke with patient regarding her CAD results. Transferred call to front desk staff to schedule appt.

## 2020-04-22 NOTE — Progress Notes (Signed)
Please inform the patient that there is mild progression of disease in bilateral carotid arteries, I would like to repeat the study prior to her next office visit in August with [facility].  Please inform schedulers, orders have been placed.

## 2020-04-23 NOTE — Telephone Encounter (Signed)
error 

## 2020-04-29 DIAGNOSIS — M7071 Other bursitis of hip, right hip: Secondary | ICD-10-CM | POA: Diagnosis not present

## 2020-05-12 ENCOUNTER — Ambulatory Visit: Payer: Medicare Other

## 2020-05-12 ENCOUNTER — Other Ambulatory Visit: Payer: Self-pay

## 2020-05-12 DIAGNOSIS — I6523 Occlusion and stenosis of bilateral carotid arteries: Secondary | ICD-10-CM

## 2020-05-19 ENCOUNTER — Other Ambulatory Visit: Payer: Self-pay | Admitting: Cardiology

## 2020-05-19 DIAGNOSIS — I10 Essential (primary) hypertension: Secondary | ICD-10-CM

## 2020-05-27 ENCOUNTER — Other Ambulatory Visit: Payer: Self-pay | Admitting: Cardiology

## 2020-05-27 DIAGNOSIS — I6523 Occlusion and stenosis of bilateral carotid arteries: Secondary | ICD-10-CM

## 2020-05-28 ENCOUNTER — Encounter: Payer: Self-pay | Admitting: Internal Medicine

## 2020-05-28 ENCOUNTER — Ambulatory Visit (INDEPENDENT_AMBULATORY_CARE_PROVIDER_SITE_OTHER): Payer: Medicare Other

## 2020-05-28 ENCOUNTER — Other Ambulatory Visit: Payer: Self-pay

## 2020-05-28 ENCOUNTER — Ambulatory Visit (INDEPENDENT_AMBULATORY_CARE_PROVIDER_SITE_OTHER): Payer: Medicare Other | Admitting: Internal Medicine

## 2020-05-28 ENCOUNTER — Ambulatory Visit: Payer: Medicare Other | Admitting: Internal Medicine

## 2020-05-28 VITALS — BP 132/68 | HR 62 | Temp 98.2°F | Ht 61.8 in | Wt 222.6 lb

## 2020-05-28 DIAGNOSIS — I1310 Hypertensive heart and chronic kidney disease without heart failure, with stage 1 through stage 4 chronic kidney disease, or unspecified chronic kidney disease: Secondary | ICD-10-CM

## 2020-05-28 DIAGNOSIS — Z0000 Encounter for general adult medical examination without abnormal findings: Secondary | ICD-10-CM | POA: Diagnosis not present

## 2020-05-28 DIAGNOSIS — N1832 Chronic kidney disease, stage 3b: Secondary | ICD-10-CM | POA: Diagnosis not present

## 2020-05-28 DIAGNOSIS — Z6841 Body Mass Index (BMI) 40.0 and over, adult: Secondary | ICD-10-CM

## 2020-05-28 DIAGNOSIS — I25118 Atherosclerotic heart disease of native coronary artery with other forms of angina pectoris: Secondary | ICD-10-CM | POA: Diagnosis not present

## 2020-05-28 DIAGNOSIS — I10 Essential (primary) hypertension: Secondary | ICD-10-CM

## 2020-05-28 DIAGNOSIS — R7303 Prediabetes: Secondary | ICD-10-CM | POA: Diagnosis not present

## 2020-05-28 DIAGNOSIS — N183 Chronic kidney disease, stage 3 unspecified: Secondary | ICD-10-CM

## 2020-05-28 LAB — POCT UA - MICROALBUMIN
Creatinine, POC: 300 mg/dL
Microalbumin Ur, POC: 80 mg/L

## 2020-05-28 LAB — POCT URINALYSIS DIPSTICK
Bilirubin, UA: NEGATIVE
Blood, UA: NEGATIVE
Glucose, UA: NEGATIVE
Ketones, UA: NEGATIVE
Leukocytes, UA: NEGATIVE
Nitrite, UA: NEGATIVE
Protein, UA: NEGATIVE
Spec Grav, UA: 1.02 (ref 1.010–1.025)
Urobilinogen, UA: 0.2 E.U./dL
pH, UA: 6 (ref 5.0–8.0)

## 2020-05-28 NOTE — Progress Notes (Signed)
I,[name],acting as a Education administrator for [name], MD.,have documented all relevant documentation on the behalf of [name], MD,as directed by  [name], MD while in the presence of [name], MD.  This visit occurred during the SARS-CoV-2 public health emergency.  Safety protocols were in place, including screening questions prior to the visit, additional usage of staff PPE, and extensive cleaning of exam room while observing appropriate contact time as indicated for disinfecting solutions.  Subjective:     Patient ID: Kathryn [name] , female    DOB: Jun 20, 1940 , 80 y.o.   MRN: [id number]   Chief Complaint  Patient presents with  . Hypertension    HPI  She presents for BP check. She reports compliance with meds.  She denies headaches, chest pain and shortness of breath.  She is also scheduled for [facility] with [facility] today.   Hypertension This is a chronic problem. The current episode started more than 1 year ago. The problem has been gradually improving since onset. The problem is controlled. Pertinent negatives include no blurred vision, chest pain, palpitations or shortness of breath. Risk factors for coronary artery disease include dyslipidemia, obesity, post-menopausal state and sedentary lifestyle. The current treatment provides moderate improvement. Compliance problems include exercise.  Hypertensive end-organ damage includes kidney disease and CAD/MI.     Past Medical History:  Diagnosis Date  . Anxiety    takes Xanax bid prn  . Bruises easily    pt is on Plavix  . Chronic back pain    buldging disc  . Chronic neck pain   . Coronary artery disease    1 stent   . Dysphagia   . Headache(784.0)    related to cervical issues  . History of colonic polyps   . Hyperlipidemia    takes Crestor daily  . Hypertension    takes Coreg and Diovan daily  . Osteoporosis    was taking shot 2xyr;but not taking anymore  . Peripheral vascular disease  ([place])   . Seasonal allergies    takes Zyrtec nightly     Family History  Problem Relation Age of Onset  . Heart disease Mother        Heart Dissease before age 59  . Heart attack Mother   . Cancer Father   . Cancer Sister   . Leukemia Sister   . Cancer Brother   . Heart disease Brother        Amputation  . Heart attack Brother   . Heart disease Brother   . Heart attack Brother   . Dementia Other   . Anesthesia problems Neg Hx   . Hypotension Neg Hx   . Malignant hyperthermia Neg Hx   . Pseudochol deficiency Neg Hx      Current Outpatient Medications:  .  acetaminophen (TYLENOL) 325 MG tablet, Take 325 mg by mouth every 6 (six) hours as needed for mild pain., Disp: , Rfl:  .  ALPRAZolam (XANAX) 0.25 MG tablet, TAKE (1) TABLET BY MOUTH TWICE DAILY AS NEEDED FOR ANXIETY., Disp: 30 tablet, Rfl: 0 .  amLODipine (NORVASC) 10 MG tablet, TAKE ONE TABLET BY MOUTH ONCE DAILY., Disp: 90 tablet, Rfl: 1 .  Apoaequorin (PREVAGEN PO), Take 1 tablet by mouth daily., Disp: , Rfl:  .  Aspirin 81 MG CAPS, , Disp: , Rfl:  .  CALCIUM PO, Take 1 tablet by mouth daily. 600 mg, Disp: , Rfl:  .  carvedilol (COREG) 12.5 MG  tablet, Take 1 tablet (12.5 mg total) by mouth 2 (two) times daily with a meal., Disp: 180 tablet, Rfl: 3 .  cetirizine (ZYRTEC) 10 MG tablet, Take 10 mg by mouth at bedtime., Disp: , Rfl:  .  ezetimibe (ZETIA) 10 MG tablet, Take 1 tablet (10 mg total) by mouth daily., Disp: 30 tablet, Rfl: 11 .  ferrous sulfate 324 MG TBEC, Take 324 mg by mouth every other day., Disp: , Rfl:  .  furosemide (LASIX) 20 MG tablet, Take 1 tablet (20 mg total) by mouth daily as needed for fluid., Disp: 90 tablet, Rfl: 0 .  Investigational - Study Medication, Take 1 tablet by mouth daily. Study name:008 Additional study details:For cholesterol (Dr. Skip Estimable), Disp: , Rfl:  .  levocetirizine (XYZAL) 5 MG tablet, Take 5 mg by mouth every evening. (Patient not taking: Reported on 05/28/2020), Disp: , Rfl:  .   Magnesium 250 MG TABS, Take 1 tablet by mouth daily., Disp: , Rfl:  .  meloxicam (MOBIC) 7.5 MG tablet, Take 7.5 mg by mouth daily., Disp: , Rfl:  .  Multiple Vitamin (MULTIVITAMIN WITH MINERALS) TABS, Take 1 tablet by mouth daily., Disp: , Rfl:  .  nitroGLYCERIN (NITROSTAT) 0.4 MG SL tablet, Place 1 tablet (0.4 mg total) under the tongue every 5 (five) minutes x 3 doses as needed for chest pain., Disp: 30 tablet, Rfl: 3 .  pravastatin (PRAVACHOL) 40 MG tablet, TAKE 1 TABLET WEEKLY. (SAME DAY EACH WEEK), Disp: 13 tablet, Rfl: 1 .  pregabalin (LYRICA) 75 MG capsule, TAKE 1 CAPSULE BY MOUTH THREE TIMES A DAY., Disp: 90 capsule, Rfl: 4 .  psyllium (REGULOID) 0.52 g capsule, Take 0.52 g by mouth 2 (two) times daily., Disp: , Rfl:  .  triamcinolone cream (KENALOG) 0.1 %, Apply 1 application topically 2 (two) times daily., Disp: 30 g, Rfl: 0 .  valACYclovir (VALTREX) 1000 MG tablet, Take 1 tablet (1,000 mg total) by mouth 3 (three) times daily., Disp: 21 tablet, Rfl: 0   Allergies  Allergen Reactions  . Codeine Hypertension  . Shellfish Allergy      Review of Systems  Constitutional: Negative.   Eyes: Negative for blurred vision.  Respiratory: Negative.  Negative for shortness of breath.   Cardiovascular: Negative.  Negative for chest pain and palpitations.  Gastrointestinal: Negative.   Neurological: Negative.   Psychiatric/Behavioral: Negative.      Today's Vitals   05/28/20 1104  BP: 132/68  Pulse: 62  Temp: 98.2 F (36.8 C)  TempSrc: Oral  Weight: 222 lb 9.6 oz (101 kg)  Height: 5' 1.8" (1.57 m)  PainSc: 3   PainLoc: Leg   Body mass index is 40.98 kg/m.  Wt Readings from Last 3 Encounters:  05/28/20 222 lb 9.6 oz (101 kg)  05/28/20 222 lb 9.6 oz (101 kg)  04/03/20 225 lb (102.1 kg)   Objective:  Physical Exam Vitals and nursing note reviewed.  Constitutional:      Appearance: Normal appearance. She is obese.  HENT:     Head: Normocephalic and atraumatic.     Nose:      Comments: Masked     Mouth/Throat:     Comments: Masked  Cardiovascular:     Rate and Rhythm: Normal rate and regular rhythm.     Heart sounds: Normal heart sounds.  Pulmonary:     Effort: Pulmonary effort is normal.     Breath sounds: Normal breath sounds.  Musculoskeletal:     Cervical back: Normal range  of motion.  Skin:    General: Skin is warm.  Neurological:     General: No focal deficit present.     Mental Status: She is alert.  Psychiatric:        Mood and Affect: Mood normal.        Behavior: Behavior normal.         Assessment And Plan:     1. Hypertensive heart and renal disease with renal failure, stage 1 through stage 4 or unspecified chronic kidney disease, without heart failure Comments: Controlled. She will continue with current meds. She is encouraged to limit her sodium intake.  I will check renal function today.  - CMP14+EGFR - Insulin, random([phone])  2. Stage 3b chronic kidney disease ([place]) Comments: She is also followed by Ren[facility]. Encouraged to keep BP well controlled and to stay well hydrated to prevent/delay progression of CKD.   3. Prediabetes Comments: Her last a1c was normal. I will recheck repeat a1c at her next visit. She is encouraged to limit her intake of sweetened beverages, including diet.   4. Atherosclerosis of native coronary artery of native heart with stable angina pectoris ([place]) Comments: Chronic, she is encouraged to live a heart healthy lifestyle. Advised to aim for at least 150 minutes of exercise per week.   5. Class 3 severe obesity due to excess calories with serious comorbidity and body mass index (BMI) of 40.0 to 44.9 in adult [place]) Comments: BMI 40. She is encouraged to initially strive for BMI less than 35 to decrease cardiac risk.  Patient was given opportunity to ask questions. Patient verbalized understanding of the plan and was able to repeat key elements of the plan. All questions were answered to their satisfaction.    I, [name], MD, have reviewed all documentation for this visit. The documentation on 05/28/20 for the exam, diagnosis, procedures, and orders are all accurate and complete.   IF YOU HAVE BEEN REFERRED TO A SPECIALIST, IT MAY TAKE 1-2 WEEKS TO SCHEDULE/PROCESS THE REFERRAL. IF YOU HAVE NOT HEARD FROM US/SPECIALIST IN TWO WEEKS, PLEASE GIVE [place] A CALL AT [phone] X [phone].   THE PATIENT IS ENCOURAGED TO PRACTICE SOCIAL DISTANCING DUE TO THE COVID-19 PANDEMIC.

## 2020-05-28 NOTE — Addendum Note (Signed)
Addended by: Glenna Durand E on: 05/28/2020 01:58 PM   Modules accepted: Orders

## 2020-05-28 NOTE — Progress Notes (Signed)
This visit occurred during the SARS-CoV-2 public health emergency.  Safety protocols were in place, including screening questions prior to the visit, additional usage of staff PPE, and extensive cleaning of exam room while observing appropriate contact time as indicated for disinfecting solutions.  Subjective:   Kathryn [name] is a 80 y.o. female who presents for Medicare Annual (Subsequent) preventive examination.  Review of Systems     Cardiac Risk Factors include: advanced age (>68men, >71 women);dyslipidemia;hypertension;obesity (BMI >30kg/m2);sedentary lifestyle     Objective:    Today's Vitals   05/28/20 1030 05/28/20 1038  BP: 132/68   Pulse: 62   Temp: 98.2 F (36.8 C)   TempSrc: Oral   SpO2: 94%   Weight: 222 lb 9.6 oz (101 kg)   Height: 5' 1.8" (1.57 m)   PainSc:  2    Body mass index is 40.98 kg/m.  Advanced Directives 05/28/2020 05/23/2019 11/15/2018 05/24/2018 01/14/2018 05/11/2016 03/17/2011  Does Patient Have a Medical Advance Directive? Yes Yes Yes No No No Patient does not have advance directive;Patient would not like information  Type of Scientist, forensic Power of [place];Living will [place];Living will Living will - - - -  Copy of [place] in Chart? No - copy requested No - copy requested - - - - -  Would patient like information on creating a medical advance directive? - - - No - Patient declined No - Patient declined - -  Pre-existing out of facility DNR order (yellow form or pink MOST form) - - - - - - No    Current Medications (verified) Outpatient Encounter Medications as of 05/28/2020  Medication Sig  . acetaminophen (TYLENOL) 325 MG tablet Take 325 mg by mouth every 6 (six) hours as needed for mild pain.  [name] ALPRAZolam (XANAX) 0.25 MG tablet TAKE (1) TABLET BY MOUTH TWICE DAILY AS NEEDED FOR ANXIETY.  [name] amLODipine (NORVASC) 10 MG tablet TAKE ONE TABLET BY MOUTH ONCE DAILY.  [name] Apoaequorin (PREVAGEN PO)  Take 1 tablet by mouth daily.  . Aspirin 81 MG CAPS   . CALCIUM PO Take 1 tablet by mouth daily. 600 mg  . carvedilol (COREG) 12.5 MG tablet Take 1 tablet (12.5 mg total) by mouth 2 (two) times daily with a meal.  . cetirizine (ZYRTEC) 10 MG tablet Take 10 mg by mouth at bedtime.  [name] ezetimibe (ZETIA) 10 MG tablet Take 1 tablet (10 mg total) by mouth daily.  . ferrous sulfate 324 MG TBEC Take 324 mg by mouth every other day.  . furosemide (LASIX) 20 MG tablet Take 1 tablet (20 mg total) by mouth daily as needed for fluid.  . Investigational - Study Medication Take 1 tablet by mouth daily. Study name:008 Additional study details:For cholesterol (Dr. Skip Estimable)  . Magnesium 250 MG TABS Take 1 tablet by mouth daily.  . meloxicam (MOBIC) 7.5 MG tablet Take 7.5 mg by mouth daily.  . Multiple Vitamin (MULTIVITAMIN WITH MINERALS) TABS Take 1 tablet by mouth daily.  . nitroGLYCERIN (NITROSTAT) 0.4 MG SL tablet Place 1 tablet (0.4 mg total) under the tongue every 5 (five) minutes x 3 doses as needed for chest pain.  . pravastatin (PRAVACHOL) 40 MG tablet TAKE 1 TABLET WEEKLY. (SAME DAY EACH WEEK)  . pregabalin (LYRICA) 75 MG capsule TAKE 1 CAPSULE BY MOUTH THREE TIMES A DAY.  [name] psyllium (REGULOID) 0.52 g capsule Take 0.52 g by mouth 2 (two) times daily.  [name] triamcinolone cream (KENALOG) 0.1 %  Apply 1 application topically 2 (two) times daily.  . valACYclovir (VALTREX) 1000 MG tablet Take 1 tablet (1,000 mg total) by mouth 3 (three) times daily.  [name] levocetirizine (XYZAL) 5 MG tablet Take 5 mg by mouth every evening. (Patient not taking: Reported on 05/28/2020)   No facility-administered encounter medications on file as of 05/28/2020.    Allergies (verified) Codeine and Shellfish allergy   History: Past Medical History:  Diagnosis Date  . Anxiety    takes Xanax bid prn  . Bruises easily    pt is on Plavix  . Chronic back pain    buldging disc  . Chronic neck pain   . Coronary artery disease    1  stent   . Dysphagia   . Headache(784.0)    related to cervical issues  . History of colonic polyps   . Hyperlipidemia    takes Crestor daily  . Hypertension    takes Coreg and Diovan daily  . Osteoporosis    was taking shot 2xyr;but not taking anymore  . Peripheral vascular disease ([place])   . Seasonal allergies    takes Zyrtec nightly   Past Surgical History:  Procedure Laterality Date  . ABDOMINAL HYSTERECTOMY  70's  . BUNIONECTOMY     left foot  . CARDIAC CATHETERIZATION  2009/2011   1 stent placed  . CHOLECYSTECTOMY  2009  . COLONOSCOPY    . ESOPHAGOGASTRODUODENOSCOPY    . LEFT HEART CATH AND CORONARY ANGIOGRAPHY N/A 01/16/2018   Procedure: LEFT HEART CATH AND CORONARY ANGIOGRAPHY;  Surgeon: Nigel Mormon, MD;  Location: [facility];  Service: Cardiovascular;  Laterality: N/A;  . TONSILLECTOMY     at age 62   Family History  Problem Relation Age of Onset  . Heart disease Mother        Heart Dissease before age 27  . Heart attack Mother   . Cancer Father   . Cancer Sister   . Leukemia Sister   . Cancer Brother   . Heart disease Brother        Amputation  . Heart attack Brother   . Heart disease Brother   . Heart attack Brother   . Dementia Other   . Anesthesia problems Neg Hx   . Hypotension Neg Hx   . Malignant hyperthermia Neg Hx   . Pseudochol deficiency Neg Hx    Social History   Socioeconomic History  . Marital status: Married    Spouse name: Not on file  . Number of children: 4  . Years of education: Not on file  . Highest education level: Not on file  Occupational History  . Occupation: retired  Tobacco Use  . Smoking status: Former Smoker    Packs/day: 0.25    Years: 5.00    Pack years: 1.25    Quit date: 11/27/1968    Years since quitting: 51.5  . Smokeless tobacco: Never Used  Vaping Use  . Vaping Use: Never used  Substance and Sexual Activity  . Alcohol use: No  . Drug use: No  . Sexual activity: Not Currently  Other  Topics Concern  . Not on file  Social History Narrative  . Not on file   Social Determinants of Health   Financial Resource Strain: Low Risk   . Difficulty of Paying Living Expenses: Not hard at all  Food Insecurity: No Food Insecurity  . Worried About Charity fundraiser in the Last Year: Never true  . Ran  Out of Food in the Last Year: Never true  Transportation Needs: No Transportation Needs  . Lack of Transportation (Medical): No  . Lack of Transportation (Non-Medical): No  Physical Activity: Inactive  . Days of Exercise per Week: 0 days  . Minutes of Exercise per Session: 0 min  Stress: No Stress Concern Present  . Feeling of Stress : Not at all  Social Connections: Not on file    Tobacco Counseling Counseling given: Not Answered   Clinical Intake:  Pre-visit preparation completed: Yes  Pain : 0-10 Pain Score: 2  Pain Type: Chronic pain Pain Location: Leg Pain Orientation: Right Pain Descriptors / Indicators: Aching Pain Onset: More than a month ago Pain Frequency: Constant     Nutritional Status: BMI > 30  Obese Nutritional Risks: None Diabetes: No  How often do you need to have someone help you when you read instructions, pamphlets, or other written materials from your doctor or pharmacy?: 1 - Never What is the last grade level you completed in school?: 35yrs college  Diabetic? no     Information entered by :: NAllen LPN   Activities of Daily Living In your present state of health, do you have any difficulty performing the following activities: 05/28/2020  Hearing? N  Vision? N  Difficulty concentrating or making decisions? N  Walking or climbing stairs? Y  Dressing or bathing? N  Doing errands, shopping? N  Preparing Food and eating ? N  Using the Toilet? N  In the past six months, have you accidently leaked urine? N  Do you have problems with loss of bowel control? N  Managing your Medications? N  Managing your Finances? N  Housekeeping or  managing your Housekeeping? N  Some recent data might be hidden    Patient Care Team: Glendale Chard, MD as PCP - General (Internal Medicine) Adrian Prows, MD as Attending Physician (Cardiology) Little, Claudette Stapler, RN as Case Manager  Indicate any recent Medical Services you may have received from other than [facility] providers in the past year (date may be approximate).     Assessment:   This is a routine wellness examination for [place].  Hearing/Vision screen  Hearing Screening   125Hz  250Hz  500Hz  1000Hz  2000Hz  3000Hz  4000Hz  6000Hz  8000Hz   Right ear:           Left ear:           Vision Screening Comments: Regular eye exams, [facility]  Dietary issues and exercise activities discussed: Current Exercise Habits: The patient does not participate in regular exercise at present  Goals    .  Hypertension - Disease Progression Prevented or Minimized      Timeframe:  Long-Range Goal Priority:  High Start Date:  04/08/20                           Expected End Date:  10/06/20    Next Follow Up Date: 06/02/20  Patient Goals/Self Care Activities:  . Self administer medications as prescribed . Attend all scheduled provider appointments . Call pharmacy for medication refills . Call provider office for new concerns or questions . Continue to monitor BP at home and record as discussed . Continue to adhere to dietary and exercise recommendations                    .  Patient Stated      05/28/2020, wants to weigh 160 pounds    .  to have less Sciatica pain (pt-stated)      Timeframe:  Short-Term Goal Priority:  High Start Date:  03/10/20                           Expected End Date:  06/02/20  Follow Up Date: 06/02/20  Over the next 180 days, patient will:  .  Patient will keep all MD f/u appointments with Dr. Mina Marble for ongoing evaluation of her Sciatica pain  . Self-administers medications as prescribed . Attends all scheduled provider appointments . Calls pharmacy for medication  refills . Calls provider office for new concerns or questions                      .  Weight (lb) < 200 lb (90.7 kg)      11/15/2018, wants to get below 200 pounds    .  Weight (lb) < 200 lb (90.7 kg)      05/23/2019, wants to weigh 175-185      Depression Screen PHQ 2/9 Scores 05/28/2020 05/23/2019 12/18/2018 11/15/2018 08/20/2018 05/24/2018 04/23/2018  PHQ - 2 Score 0 0 0 0 0 1 0  PHQ- 9 Score - - - 2 - - -    Fall Risk Fall Risk  05/28/2020 05/23/2019 12/18/2018 11/15/2018 08/20/2018  Falls in the past year? 1 1 0 0 0  Comment tripped tripped - - -  Number falls in past yr: 0 0 - 0 -  Injury with Fall? 0 0 - - -  Risk for fall due to : Medication side effect History of fall(s);Medication side effect - Medication side effect -  Follow up Falls evaluation completed;Education provided;Falls prevention discussed Falls evaluation completed;Education provided;Falls prevention discussed - Falls evaluation completed;Education provided;Falls prevention discussed -    FALL RISK PREVENTION PERTAINING TO THE HOME:  Any stairs in or around the home? Yes  If so, are there any without handrails? No  Home free of loose throw rugs in walkways, pet beds, electrical cords, etc? Yes  Adequate lighting in your home to reduce risk of falls? Yes   ASSISTIVE DEVICES UTILIZED TO PREVENT FALLS:  Life alert? No  Use of a cane, walker or w/c? No  Grab bars in the bathroom? Yes  Shower chair or bench in shower? Yes  Elevated toilet seat or a handicapped toilet? Yes   TIMED UP AND GO:  Was the test performed? No . .   Gait slow and steady without use of assistive device  Cognitive Function:     6CIT Screen 05/28/2020 05/23/2019 11/15/2018  What Year? 0 points 0 points 0 points  What month? 0 points 3 points 0 points  What time? 0 points 0 points 0 points  Count back from 20 0 points 0 points 0 points  Months in reverse 2 points 0 points 0 points  Repeat phrase 10 points 2 points 4 points  Total Score 12 5 4      Immunizations Immunization History  Administered Date(s) Administered  . DTaP 10/25/2012  . Fluad Quad(high Dose 65+) 12/03/2019  . Influenza, High Dose Seasonal PF 12/21/2017, 11/15/2018  . PFIZER(Purple Top)SARS-COV-2 Vaccination 03/01/2019, 03/22/2019, 11/13/2019  . Pneumococcal-Unspecified 03/12/2012, 07/15/2013  . Zoster Recombinat (Shingrix) 11/01/2018    TDAP status: Up to date  Flu Vaccine status: Up to date  Pneumococcal vaccine status: Declined,  Education has been provided regarding the importance of this vaccine but patient still declined. Advised may receive  this vaccine at local pharmacy or Health Dept. Aware to provide a copy of the vaccination record if obtained from local pharmacy or Health Dept. Verbalized acceptance and understanding.   Covid-19 vaccine status: Completed vaccines  Qualifies for Shingles Vaccine? Yes   Zostavax completed No   Shingrix Completed?: Yes  Screening Tests Health Maintenance  Topic Date Due  . PNA vac Low Risk Adult (2 of 2 - PCV13) 05/28/2021 (Originally 07/16/2014)  . INFLUENZA VACCINE  09/14/2020  . TETANUS/TDAP  10/26/2022  . DEXA SCAN  Completed  . COVID-19 Vaccine  Completed  . HPV VACCINES  Aged Out    Health Maintenance  There are no preventive care reminders to display for this patient.  Colorectal cancer screening: No longer required.   Mammogram status: Completed 11/13/2019. Repeat every year  Bone Density status: Completed 03/17/2016.  Lung Cancer Screening: (Low Dose CT Chest recommended if Age 9-80 years, 30 pack-year currently smoking OR have quit w/in 15years.) does not qualify.   Lung Cancer Screening Referral: no  Additional Screening:  Hepatitis C Screening: does not qualify;   Vision Screening: Recommended annual ophthalmology exams for early detection of glaucoma and other disorders of the eye. Is the patient up to date with their annual eye exam?  Yes  Who is the provider or what is the name of  the office in which the patient attends annual eye exams? [facility] If pt is not established with a provider, would they like to be referred to a provider to establish care? No .   Dental Screening: Recommended annual dental exams for proper oral hygiene  Community Resource Referral / Chronic Care Management: CRR required this visit?  No   CCM required this visit?  No      Plan:     I have personally reviewed and noted the following in the patient's chart:   . Medical and social history . Use of alcohol, tobacco or illicit drugs  . Current medications and supplements . Functional ability and status . Nutritional status . Physical activity . Advanced directives . List of other physicians . Hospitalizations, surgeries, and ER visits in previous 12 months . Vitals . Screenings to include cognitive, depression, and falls . Referrals and appointments  In addition, I have reviewed and discussed with patient certain preventive protocols, quality metrics, and best practice recommendations. A written personalized care plan for preventive services as well as general preventive health recommendations were provided to patient.     Kellie Simmering, LPN   [id number]   Nurse Notes:

## 2020-05-28 NOTE — Patient Instructions (Signed)
Kathryn [name] , Thank you for taking time to come for your Medicare Wellness Visit. I appreciate your ongoing commitment to your health goals. Please review the following plan we discussed and let me know if I can assist you in the future.   Screening recommendations/referrals: Colonoscopy: not required Mammogram: completed 11/13/2019 Bone Density: completed 03/17/2016 Recommended yearly ophthalmology/optometry visit for glaucoma screening and checkup Recommended yearly dental visit for hygiene and checkup  Vaccinations: Influenza vaccine: completed 12/03/2019, due 09/14/2020 Pneumococcal vaccine: decline Tdap vaccine: completed 10/25/2012, due 10/26/2022 Shingles vaccine: completed   Covid-19: 11/13/2019, 03/22/2019, 03/01/2019  Advanced directives: Please bring a copy of your POA (Power of Attorney) and/or Living Will to your next appointment.   Conditions/risks identified: none  Next appointment: Follow up in one year for your annual wellness visit    Preventive Care 65 Years and Older, Female Preventive care refers to lifestyle choices and visits with your health care provider that can promote health and wellness. What does preventive care include?  A yearly physical exam. This is also called an annual well check.  Dental exams once or twice a year.  Routine eye exams. Ask your health care provider how often you should have your eyes checked.  Personal lifestyle choices, including:  Daily care of your teeth and gums.  Regular physical activity.  Eating a healthy diet.  Avoiding tobacco and drug use.  Limiting alcohol use.  Practicing safe sex.  Taking low-dose aspirin every day.  Taking vitamin and mineral supplements as recommended by your health care provider. What happens during an annual well check? The services and screenings done by your health care provider during your annual well check will depend on your age, overall health, lifestyle risk factors, and family  history of disease. Counseling  Your health care provider may ask you questions about your:  Alcohol use.  Tobacco use.  Drug use.  Emotional well-being.  Home and relationship well-being.  Sexual activity.  Eating habits.  History of falls.  Memory and ability to understand (cognition).  Work and work Statistician.  Reproductive health. Screening  You may have the following tests or measurements:  Height, weight, and BMI.  Blood pressure.  Lipid and cholesterol levels. These may be checked every 5 years, or more frequently if you are over 51 years old.  Skin check.  Lung cancer screening. You may have this screening every year starting at age 26 if you have a 30-pack-year history of smoking and currently smoke or have quit within the past 15 years.  Fecal occult blood test (FOBT) of the stool. You may have this test every year starting at age 13.  Flexible sigmoidoscopy or colonoscopy. You may have a sigmoidoscopy every 5 years or a colonoscopy every 10 years starting at age 36.  Hepatitis C blood test.  Hepatitis B blood test.  Sexually transmitted disease (STD) testing.  Diabetes screening. This is done by checking your blood sugar (glucose) after you have not eaten for a while (fasting). You may have this done every 1-3 years.  Bone density scan. This is done to screen for osteoporosis. You may have this done starting at age 72.  Mammogram. This may be done every 1-2 years. Talk to your health care provider about how often you should have regular mammograms. Talk with your health care provider about your test results, treatment options, and if necessary, the need for more tests. Vaccines  Your health care provider may recommend certain vaccines, such as:  Influenza vaccine.  This is recommended every year.  Tetanus, diphtheria, and acellular pertussis (Tdap, Td) vaccine. You may need a Td booster every 10 years.  Zoster vaccine. You may need this after  age 40.  Pneumococcal 13-valent conjugate (PCV13) vaccine. One dose is recommended after age 100.  Pneumococcal polysaccharide (PPSV23) vaccine. One dose is recommended after age 67. Talk to your health care provider about which screenings and vaccines you need and how often you need them. This information is not intended to replace advice given to you by your health care provider. Make sure you discuss any questions you have with your health care provider. Document Released: 02/27/2015 Document Revised: 10/21/2015 Document Reviewed: 12/02/2014 Elsevier Interactive Patient Education  2017 [employer] Prevention in the Home Falls can cause injuries. They can happen to people of all ages. There are many things you can do to make your home safe and to help prevent falls. What can I do on the outside of my home?  Regularly fix the edges of walkways and driveways and fix any cracks.  Remove anything that might make you trip as you walk through a door, such as a raised step or threshold.  Trim any bushes or trees on the path to your home.  Use bright outdoor lighting.  Clear any walking paths of anything that might make someone trip, such as rocks or tools.  Regularly check to see if handrails are loose or broken. Make sure that both sides of any steps have handrails.  Any raised decks and porches should have guardrails on the edges.  Have any leaves, snow, or ice cleared regularly.  Use sand or salt on walking paths during winter.  Clean up any spills in your garage right away. This includes oil or grease spills. What can I do in the bathroom?  Use night lights.  Install grab bars by the toilet and in the tub and shower. Do not use towel bars as grab bars.  Use non-skid mats or decals in the tub or shower.  If you need to sit down in the shower, use a plastic, non-slip stool.  Keep the floor dry. Clean up any water that spills on the floor as soon as it  happens.  Remove soap buildup in the tub or shower regularly.  Attach bath mats securely with double-sided non-slip rug tape.  Do not have throw rugs and other things on the floor that can make you trip. What can I do in the bedroom?  Use night lights.  Make sure that you have a light by your bed that is easy to reach.  Do not use any sheets or blankets that are too big for your bed. They should not hang down onto the floor.  Have a firm chair that has side arms. You can use this for support while you get dressed.  Do not have throw rugs and other things on the floor that can make you trip. What can I do in the kitchen?  Clean up any spills right away.  Avoid walking on wet floors.  Keep items that you use a lot in easy-to-reach places.  If you need to reach something above you, use a strong step stool that has a grab bar.  Keep electrical cords out of the way.  Do not use floor polish or wax that makes floors slippery. If you must use wax, use non-skid floor wax.  Do not have throw rugs and other things on the floor that can make  you trip. What can I do with my stairs?  Do not leave any items on the stairs.  Make sure that there are handrails on both sides of the stairs and use them. Fix handrails that are broken or loose. Make sure that handrails are as long as the stairways.  Check any carpeting to make sure that it is firmly attached to the stairs. Fix any carpet that is loose or worn.  Avoid having throw rugs at the top or bottom of the stairs. If you do have throw rugs, attach them to the floor with carpet tape.  Make sure that you have a light switch at the top of the stairs and the bottom of the stairs. If you do not have them, ask someone to add them for you. What else can I do to help prevent falls?  Wear shoes that:  Do not have high heels.  Have rubber bottoms.  Are comfortable and fit you well.  Are closed at the toe. Do not wear sandals.  If you  use a stepladder:  Make sure that it is fully opened. Do not climb a closed stepladder.  Make sure that both sides of the stepladder are locked into place.  Ask someone to hold it for you, if possible.  Clearly mark and make sure that you can see:  Any grab bars or handrails.  First and last steps.  Where the edge of each step is.  Use tools that help you move around (mobility aids) if they are needed. These include:  Canes.  Walkers.  Scooters.  Crutches.  Turn on the lights when you go into a dark area. Replace any light bulbs as soon as they burn out.  Set up your furniture so you have a clear path. Avoid moving your furniture around.  If any of your floors are uneven, fix them.  If there are any pets around you, be aware of where they are.  Review your medicines with your doctor. Some medicines can make you feel dizzy. This can increase your chance of falling. Ask your doctor what other things that you can do to help prevent falls. This information is not intended to replace advice given to you by your health care provider. Make sure you discuss any questions you have with your health care provider. Document Released: 11/27/2008 Document Revised: 07/09/2015 Document Reviewed: 03/07/2014 Elsevier Interactive Patient Education  2017 [employer].

## 2020-05-28 NOTE — Patient Instructions (Signed)

## 2020-05-29 LAB — CMP14+EGFR
ALT: 12 IU/L (ref 0–32)
AST: 19 IU/L (ref 0–40)
Albumin/Globulin Ratio: 2 (ref 1.2–2.2)
Albumin: 4.5 g/dL (ref 3.6–4.6)
Alkaline Phosphatase: 127 IU/L — ABNORMAL HIGH (ref 44–121)
BUN/Creatinine Ratio: 18 (ref 12–28)
BUN: 26 mg/dL (ref 8–27)
Bilirubin Total: 0.3 mg/dL (ref 0.0–1.2)
CO2: 22 mmol/L (ref 20–29)
Calcium: 9.5 mg/dL (ref 8.7–10.3)
Chloride: 106 mmol/L (ref 96–106)
Creatinine, Ser: 1.41 mg/dL — ABNORMAL HIGH (ref 0.57–1.00)
Globulin, Total: 2.2 g/dL (ref 1.5–4.5)
Glucose: 100 mg/dL — ABNORMAL HIGH (ref 65–99)
Potassium: 4.9 mmol/L (ref 3.5–5.2)
Sodium: 146 mmol/L — ABNORMAL HIGH (ref 134–144)
Total Protein: 6.7 g/dL (ref 6.0–8.5)
eGFR: 37 mL/min/{1.73_m2} — ABNORMAL LOW (ref >59)

## 2020-05-29 LAB — INSULIN, RANDOM: INSULIN: 22.8 u[IU]/mL (ref 2.6–24.9)

## 2020-06-01 ENCOUNTER — Other Ambulatory Visit: Payer: Self-pay | Admitting: Internal Medicine

## 2020-06-02 ENCOUNTER — Telehealth: Payer: Medicare Other

## 2020-06-02 ENCOUNTER — Telehealth: Payer: Self-pay

## 2020-06-02 NOTE — Telephone Encounter (Signed)
  Chronic Care Management   Outreach Note  06/02/2020 Name: Kathryn [name] MRN: [id number] DOB: Jun 19, 1940  Referred by: Glendale Chard, MD Reason for referral : Chronic Care Management (RN CM FU Call Attempt )   An unsuccessful telephone outreach was attempted today. The patient was referred to the case management team for assistance with care management and care coordination.   Follow Up Plan: A HIPAA compliant phone message was left for the patient providing contact information and requesting a return call. Telephone follow up appointment with care management team member scheduled for: 07/07/20  Barb Merino, RN, BSN, CCM Care Management Coordinator [facility]  Direct Phone: [phone]

## 2020-06-10 ENCOUNTER — Ambulatory Visit: Payer: Medicare Other | Admitting: Internal Medicine

## 2020-07-07 ENCOUNTER — Telehealth: Payer: Self-pay

## 2020-07-07 ENCOUNTER — Telehealth: Payer: Medicare Other

## 2020-07-07 ENCOUNTER — Ambulatory Visit (INDEPENDENT_AMBULATORY_CARE_PROVIDER_SITE_OTHER): Payer: Medicare Other

## 2020-07-07 DIAGNOSIS — M4807 Spinal stenosis, lumbosacral region: Secondary | ICD-10-CM

## 2020-07-07 DIAGNOSIS — I10 Essential (primary) hypertension: Secondary | ICD-10-CM

## 2020-07-07 DIAGNOSIS — I1310 Hypertensive heart and chronic kidney disease without heart failure, with stage 1 through stage 4 chronic kidney disease, or unspecified chronic kidney disease: Secondary | ICD-10-CM

## 2020-07-07 DIAGNOSIS — R7303 Prediabetes: Secondary | ICD-10-CM

## 2020-07-07 DIAGNOSIS — M48061 Spinal stenosis, lumbar region without neurogenic claudication: Secondary | ICD-10-CM

## 2020-07-07 DIAGNOSIS — N1832 Chronic kidney disease, stage 3b: Secondary | ICD-10-CM

## 2020-07-07 NOTE — Progress Notes (Signed)
This encounter was created in error - please disregard.

## 2020-07-07 NOTE — Telephone Encounter (Signed)
  Care Management   Follow Up Note   07/07/2020 Name: Kathryn [name] MRN: [id number] DOB: 1941-02-03   Referred by: Glendale Chard, MD Reason for referral : Chronic Care Management (RNCM Follow up call - 1st attempt )   An unsuccessful telephone outreach was attempted today. The patient was referred to the case management team for assistance with care management and care coordination.   Follow Up Plan: The care management team will reach out to the patient again over the next 30-45 days.   Barb Merino, RN, BSN, CCM Care Management Coordinator [facility]  Direct Phone: [phone]

## 2020-07-08 DIAGNOSIS — M7071 Other bursitis of hip, right hip: Secondary | ICD-10-CM | POA: Diagnosis not present

## 2020-07-10 ENCOUNTER — Other Ambulatory Visit: Payer: Self-pay | Admitting: Internal Medicine

## 2020-07-25 ENCOUNTER — Ambulatory Visit
Admission: EM | Admit: 2020-07-25 | Discharge: 2020-07-25 | Disposition: A | Discharge status: Home / Self Care | Payer: Medicare Other | Attending: Family Medicine | Admitting: Family Medicine

## 2020-07-25 ENCOUNTER — Other Ambulatory Visit: Payer: Self-pay

## 2020-07-25 ENCOUNTER — Encounter: Payer: Self-pay | Admitting: Emergency Medicine

## 2020-07-25 DIAGNOSIS — L299 Pruritus, unspecified: Secondary | ICD-10-CM | POA: Diagnosis not present

## 2020-07-25 DIAGNOSIS — W57XXXA Bitten or stung by nonvenomous insect and other nonvenomous arthropods, initial encounter: Secondary | ICD-10-CM | POA: Diagnosis not present

## 2020-07-25 MED ORDER — TRIAMCINOLONE ACETONIDE 0.1 % EX CREA: 1.0000 "application " | TOPICAL_CREAM | Freq: Two times a day (BID) | CUTANEOUS | 0 refills | Status: DC

## 2020-07-25 MED ORDER — DEXAMETHASONE SODIUM PHOSPHATE 10 MG/ML IJ SOLN
10.0000 mg | Freq: Once | INTRAMUSCULAR | Status: AC
  Administered 2020-07-25: 10 mg via INTRAMUSCULAR

## 2020-07-25 NOTE — ED Triage Notes (Signed)
Rash on arms, legs and neck since yesterday.  Rash itches

## 2020-07-25 NOTE — Discharge Instructions (Addendum)
You have received a steroid injection in the office to help with the itching  I have sent in triamcinolone cream for you to use to the area twice a day as needed.  Follow up with this office or with primary care if symptoms are persisting.  Follow up in the ER for high fever, trouble swallowing, trouble breathing, other concerning symptoms.

## 2020-07-25 NOTE — ED Provider Notes (Signed)
[facility]    CSN: [id number] Arrival date & time: 07/25/20  1524      History   Chief Complaint No chief complaint on file.   HPI Kathryn [name] is a 80 y.o. female.   Reports itching with erythematous areas to arms and legs for the last day. Has taken benadryl for the itching with no relief. Denies known cause, new exposures. Denies previous symptoms. Denies drainage from the area, SOB, cough, sneezing, chest tightness, abdominal pain, nausea, vomiting, diarrhea, fever, other symptoms.  ROS per HPI  The history is provided by the patient.   Past Medical History:  Diagnosis Date   Anxiety    takes Xanax bid prn   Bruises easily    pt is on Plavix   Chronic back pain    buldging disc   Chronic neck pain    Coronary artery disease    1 stent    Dysphagia    Headache(784.0)    related to cervical issues   History of colonic polyps    Hyperlipidemia    takes Crestor daily   Hypertension    takes Coreg and Diovan daily   Osteoporosis    was taking shot 2xyr;but not taking anymore   Peripheral vascular disease (HCC)    Seasonal allergies    takes Zyrtec nightly    Patient Active Problem List   Diagnosis Date Noted   Coronary artery disease of native artery of native heart with stable angina pectoris ([place]) 06/07/2018   Anxiety 01/18/2018   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   Chronic renal disease, 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   Other abnormal glucose 01/15/2018   Neuralgia, 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   NSTEMI (non-ST elevated myocardial infarction) ([place]) 01/14/2018   Essential hypertension 01/14/2018   Hyperlipidemia 01/14/2018   Carotid stenosis, asymptomatic, bilateral 09/06/2012    Past Surgical History:  Procedure Laterality Date   ABDOMINAL HYSTERECTOMY  70's   BUNIONECTOMY     left foot   CARDIAC CATHETERIZATION  2009/2011   1 stent placed   CHOLECYSTECTOMY  2009   COLONOSCOPY     ESOPHAGOGASTRODUODENOSCOPY     LEFT HEART CATH AND  CORONARY ANGIOGRAPHY N/A 01/16/2018   Procedure: LEFT HEART CATH AND CORONARY ANGIOGRAPHY;  Surgeon: Nigel Mormon, MD;  Location: [facility];  Service: Cardiovascular;  Laterality: N/A;   TONSILLECTOMY     at age 34    OB History   No obstetric history on file.      Home Medications    Prior to Admission medications   Medication Sig Start Date End Date Taking? Authorizing Provider  triamcinolone cream (KENALOG) 0.1 % Apply 1 application topically 2 (two) times daily. 07/25/20  Yes Faustino Congress, NP  acetaminophen (TYLENOL) 325 MG tablet Take 325 mg by mouth every 6 (six) hours as needed for mild pain.    [provider]  ALPRAZolam (XANAX) 0.25 MG tablet TAKE (1) TABLET BY MOUTH TWICE DAILY AS NEEDED FOR ANXIETY. 07/20/20   Glendale Chard, MD  amLODipine (NORVASC) 10 MG tablet TAKE ONE TABLET BY MOUTH ONCE DAILY. 05/19/20   Adrian Prows, MD  Apoaequorin (PREVAGEN PO) Take 1 tablet by mouth daily.    [provider]  Aspirin 81 MG CAPS     [provider]  CALCIUM PO Take 1 tablet by mouth daily. 600 mg    [provider]  carvedilol (COREG) 12.5 MG tablet Take 1 tablet (12.5 mg total) by mouth 2 (two) times daily with  a meal. 04/03/20   Cantwell, Celeste C, PA-C  cetirizine (ZYRTEC) 10 MG tablet Take 10 mg by mouth at bedtime.    [provider]  ezetimibe (ZETIA) 10 MG tablet Take 1 tablet (10 mg total) by mouth daily. 04/20/20 04/20/21  Cantwell, Celeste C, PA-C  ferrous sulfate 324 MG TBEC Take 324 mg by mouth every other day.    [provider]  furosemide (LASIX) 20 MG tablet Take 1 tablet (20 mg total) by mouth daily as needed for fluid. 03/18/20   Cantwell, Anderson Malta C, PA-C  Investigational - Study Medication Take 1 tablet by mouth daily. Study name:[id number] Additional study details:For cholesterol (Dr. Skip Estimable)    [provider]  levocetirizine (XYZAL) 5 MG tablet Take 5 mg by mouth every evening. Patient not  taking: Reported on 05/28/2020    [provider]  Magnesium 250 MG TABS Take 1 tablet by mouth daily.    [provider]  meloxicam (MOBIC) 7.5 MG tablet Take 7.5 mg by mouth daily. 05/20/20   [provider]  Multiple Vitamin (MULTIVITAMIN WITH MINERALS) TABS Take 1 tablet by mouth daily.    [provider]  nitroGLYCERIN (NITROSTAT) 0.4 MG SL tablet Place 1 tablet (0.4 mg total) under the tongue every 5 (five) minutes x 3 doses as needed for chest pain. 03/18/20   Cantwell, Celeste C, PA-C  pravastatin (PRAVACHOL) 40 MG tablet TAKE 1 TABLET WEEKLY. (SAME DAY EACH WEEK) 02/21/20   Glendale Chard, MD  pregabalin (LYRICA) 75 MG capsule TAKE 1 CAPSULE BY MOUTH THREE TIMES A DAY. 04/07/20   Glendale Chard, MD  psyllium (REGULOID) 0.52 g capsule Take 0.52 g by mouth 2 (two) times daily.    [provider]  valACYclovir (VALTREX) 1000 MG tablet Take 1 tablet (1,000 mg total) by mouth 3 (three) times daily. 01/04/20   Avegno, Darrelyn Hillock, FNP    Family History Family History  Problem Relation Age of Onset   Heart disease Mother        Heart Dissease before age 12   Heart attack Mother    Cancer Father    Cancer Sister    Leukemia Sister    Cancer Brother    Heart disease Brother        Amputation   Heart attack Brother    Heart disease Brother    Heart attack Brother    Dementia Other    Anesthesia problems Neg Hx    Hypotension Neg Hx    Malignant hyperthermia Neg Hx    Pseudochol deficiency Neg Hx     Social History Social History   Tobacco Use   Smoking status: Former    Packs/day: 0.25    Years: 5.00    Pack years: 1.25    Types: Cigarettes    Quit date: 11/27/1968    Years since quitting: 51.6   Smokeless tobacco: Never  Vaping Use   Vaping Use: Never used  Substance Use Topics   Alcohol use: No   Drug use: No     Allergies   Codeine and Shellfish allergy   Review of Systems Review of Systems   Physical Exam Triage  Vital Signs ED Triage Vitals  Enc Vitals Group     BP 07/25/20 1531 (!) 175/78     Pulse Rate 07/25/20 1531 84     Resp 07/25/20 1531 16     Temp 07/25/20 1531 98.3 F (36.8 C)     Temp Source 07/25/20 1531 Oral  SpO2 07/25/20 1531 95 %     Weight --      Height --      Head Circumference --      Peak Flow --      Pain Score 07/25/20 1532 9     Pain Loc --      Pain Edu? --      Excl. in [place]? --    No data found.  Updated Vital Signs BP (!) 175/78 (BP Location: Right Arm)   Pulse 84   Temp 98.3 F (36.8 C) (Oral)   Resp 16   SpO2 95%   Visual Acuity Right Eye Distance:   Left Eye Distance:   Bilateral Distance:    Right Eye Near:   Left Eye Near:    Bilateral Near:     Physical Exam Vitals and nursing note reviewed.  Constitutional:      General: She is not in acute distress.    Appearance: Normal appearance. She is well-developed.  HENT:     Head: Normocephalic and atraumatic.     Nose: Nose normal.     Mouth/Throat:     Mouth: Mucous membranes are moist.     Pharynx: Oropharynx is clear.  Eyes:     Extraocular Movements: Extraocular movements intact.     Conjunctiva/sclera: Conjunctivae normal.     Pupils: Pupils are equal, round, and reactive to light.  Cardiovascular:     Rate and Rhythm: Normal rate and regular rhythm.  Pulmonary:     Effort: Pulmonary effort is normal. No respiratory distress.  Musculoskeletal:        General: Normal range of motion.     Cervical back: Normal range of motion and neck supple.  Skin:    General: Skin is warm and dry.     Capillary Refill: Capillary refill takes less than 2 seconds.     Findings: Lesion present.          Comments: Varying size erythematous wheals, consistent with bug bites  Neurological:     General: No focal deficit present.     Mental Status: She is alert and oriented to person, place, and time.  Psychiatric:        Mood and Affect: Mood normal.        Behavior: Behavior normal.         Thought Content: Thought content normal.     UC Treatments / Results  Labs (all labs ordered are listed, but only abnormal results are displayed) Labs Reviewed - No data to display  EKG   Radiology No results found.  Procedures Procedures (including critical care time)  Medications Ordered in UC Medications  dexamethasone (DECADRON) injection 10 mg (10 mg Intramuscular Given 07/25/20 1545)    Initial Impression / Assessment and Plan / UC Course  I have reviewed the triage vital signs and the nursing notes.  Pertinent labs & imaging results that were available during my care of the patient were reviewed by me and considered in my medical decision making (see chart for details).    Bug Bites Itching  Decadron 10mg  IM in office today Triamcinolone cream prescribed BID prn itching May continue benadryl at night Wear bug spray when outside Follow up with this office or with primary care if symptoms are persisting.  Follow up in the ER for high fever, trouble swallowing, trouble breathing, other concerning symptoms.   Final Clinical Impressions(s) / UC Diagnoses   Final diagnoses:  Itching  Bug bite, initial  encounter     Discharge Instructions      You have received a steroid injection in the office to help with the itching  I have sent in triamcinolone cream for you to use to the area twice a day as needed.  Follow up with this office or with primary care if symptoms are persisting.  Follow up in the ER for high fever, trouble swallowing, trouble breathing, other concerning symptoms.      ED Prescriptions     Medication Sig Dispense Auth. Provider   triamcinolone cream (KENALOG) 0.1 % Apply 1 application topically 2 (two) times daily. 30 g Faustino Congress, NP      PDMP not reviewed this encounter.   Faustino Congress, NP 07/25/20 1549

## 2020-07-27 NOTE — Patient Instructions (Signed)
Goals Addressed       Patient Stated     to have less Sciatica pain (pt-stated)   On track     Timeframe:  Short-Term Goal Priority:  High Start Date:  03/10/20                           Expected End Date:  03/10/21  Follow Up Date: 09/28/20   Patient will keep all MD f/u appointments with Dr. Mina Marble for ongoing evaluation of her Sciatica pain  Self-administers medications as prescribed Attends all scheduled provider appointments Calls pharmacy for medication refills Calls provider office for new concerns or questions                         Other     Chronic Kidney disease - disease progression prevented or minimized   On track     Timeframe:  Long-Range Goal Priority:  High Start Date:  07/07/20                           Expected End Date: 07/07/21  Next Scheduled Follow up date: 09/28/20        Patient verbalizes understanding of plan to increase water intake to 64oz daily unless otherwise directed Self administers medications as prescribed Attends all scheduled provider appointments Calls pharmacy for medication refills Calls provider office for new concerns or questions                      Hypertension - Disease Progression Prevented or Minimized   On track     Timeframe:  Long-Range Goal Priority:  High Start Date:  04/08/20                           Expected End Date:  04/08/21   Next Follow Up Date: 09/28/20  Patient Goals/Self Care Activities:  Self administer medications as prescribed Attend all scheduled provider appointments Call pharmacy for medication refills Call provider office for new concerns or questions Continue to monitor BP at home and record as discussed Continue to adhere to dietary and exercise recommendations

## 2020-07-27 NOTE — Chronic Care Management (AMB) (Signed)
Chronic Care Management   CCM RN Visit Note  07/07/2020 Name: Kathryn [name] MRN: [id number] DOB: [birth year]  Subjective: Kathryn [name] is a 80 y.o. year old female who is a primary care patient of Glendale Chard, MD. The care management team was consulted for assistance with disease management and care coordination needs.    Engaged with patient by telephone for follow up visit in response to provider referral for case management and/or care coordination services.   Consent to Services:  The patient was given information about Chronic Care Management services, agreed to services, and gave verbal consent prior to initiation of services.  Please see initial visit note for detailed documentation.   Patient agreed to services and verbal consent obtained.   Assessment: Review of patient past medical history, allergies, medications, health status, including review of consultants reports, laboratory and other test data, was performed as part of comprehensive evaluation and provision of chronic care management services.   SDOH (Social Determinants of Health) assessments and interventions performed:    CCM Care Plan  Allergies  Allergen Reactions   Codeine Hypertension   Shellfish Allergy     Outpatient Encounter Medications as of 07/07/2020  Medication Sig   acetaminophen (TYLENOL) 325 MG tablet Take 325 mg by mouth every 6 (six) hours as needed for mild pain.   amLODipine (NORVASC) 10 MG tablet TAKE ONE TABLET BY MOUTH ONCE DAILY.   Apoaequorin (PREVAGEN PO) Take 1 tablet by mouth daily.   Aspirin 81 MG CAPS    CALCIUM PO Take 1 tablet by mouth daily. 600 mg   carvedilol (COREG) 12.5 MG tablet Take 1 tablet (12.5 mg total) by mouth 2 (two) times daily with a meal.   cetirizine (ZYRTEC) 10 MG tablet Take 10 mg by mouth at bedtime.   ezetimibe (ZETIA) 10 MG tablet Take 1 tablet (10 mg total) by mouth daily.   ferrous sulfate 324 MG TBEC Take 324 mg by mouth every other day.    furosemide (LASIX) 20 MG tablet Take 1 tablet (20 mg total) by mouth daily as needed for fluid.   Investigational - Study Medication Take 1 tablet by mouth daily. Study name:[id number] Additional study details:For cholesterol (Dr. Skip Estimable)   levocetirizine (XYZAL) 5 MG tablet Take 5 mg by mouth every evening. (Patient not taking: Reported on 05/28/2020)   Magnesium 250 MG TABS Take 1 tablet by mouth daily.   meloxicam (MOBIC) 7.5 MG tablet Take 7.5 mg by mouth daily.   Multiple Vitamin (MULTIVITAMIN WITH MINERALS) TABS Take 1 tablet by mouth daily.   nitroGLYCERIN (NITROSTAT) 0.4 MG SL tablet Place 1 tablet (0.4 mg total) under the tongue every 5 (five) minutes x 3 doses as needed for chest pain.   pravastatin (PRAVACHOL) 40 MG tablet TAKE 1 TABLET WEEKLY. (SAME DAY EACH WEEK)   pregabalin (LYRICA) 75 MG capsule TAKE 1 CAPSULE BY MOUTH THREE TIMES A DAY.   psyllium (REGULOID) 0.52 g capsule Take 0.52 g by mouth 2 (two) times daily.   valACYclovir (VALTREX) 1000 MG tablet Take 1 tablet (1,000 mg total) by mouth 3 (three) times daily.   [DISCONTINUED] ALPRAZolam (XANAX) 0.25 MG tablet TAKE (1) TABLET BY MOUTH TWICE DAILY AS NEEDED FOR ANXIETY.   [DISCONTINUED] triamcinolone cream (KENALOG) 0.1 % Apply 1 application topically 2 (two) times daily.   No facility-administered encounter medications on file as of 07/07/2020.    Patient Active Problem List   Diagnosis Date Noted   Coronary artery disease of native artery  of native heart with stable angina pectoris (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   Chronic renal disease, 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   Other abnormal glucose 01/15/2018   Neuralgia, 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   NSTEMI (non-ST elevated myocardial infarction) ([place]) 01/14/2018   Essential hypertension 01/14/2018   Hyperlipidemia 01/14/2018   Carotid stenosis, asymptomatic, bilateral 09/06/2012    Conditions to be addressed/monitored: CKD III, Hypertensive  nephropathy, prediabetes, NSTEMI, Essential HTN, Spinal stenosis of lumbosacral region  Care Plan : Nonspecific Low Back Pain (Adult)  Updates made by Lynne Logan, RN since 07/27/2020 12:00 AM     Problem: Chronic Low Back Pain   Priority: High     Long-Range Goal: Chronic Low Back Pain Managed   Start Date: 03/10/2020  Expected End Date: 03/10/2021  Recent Progress: On track  Priority: High  Note:   Current Barriers:  Knowledge Deficits related to managing acute/chronic pain Non-adherence to scheduled provider appointments Non-adherence to prescribed medication regimen Difficulty obtaining medications Chronic Disease Management support and education needs related to chronic pain Acute Sciatica Pain  Nurse Case Manager Clinical Goal(s):  Patient will verbalize acceptable level of pain relief and ability to engage in desired activities Interventions:  07/07/20 completed outbound successful call  Collaboration with Glendale Chard, MD regarding development and update of comprehensive plan of care as evidenced by provider attestation and co-signature Inter-disciplinary care team collaboration (see longitudinal plan of care) Evaluation of current treatment plan related to chronic low back pain with sciatica involvement and patient's adherence to plan as established by provider. Determined patient continues to experience Sciatica pain, Dr. Mina Marble continues to manage  Educated patient on the benefits of having PT work with her on safe and effective exercises and stretches she can do long term  Encouraged patient to continue to shift her weight and use repositioning for pain relief Mailed printed educational material related to Exercises for Sciatica and Dry Needling  Instructed patient to further discuss outpatient PT with Dr. Mina Marble at next visit, patient verbalizes understanding Discussed plans with patient for ongoing care management follow up and provided patient with direct contact  information for care management team Patient Goals/Self Care Activities:  Patient will keep all MD f/u appointments with Dr. Mina Marble for ongoing evaluation of her Sciatica pain  Self-administer medications as prescribed Attend all scheduled provider appointments Call pharmacy for medication refills Call provider office for new concerns or questions  Follow Up Plan: Telephone follow up appointment with care management team member scheduled for: 09/28/20     Care Plan : Hypertension (Adult)  Updates made by Lynne Logan, RN since 07/27/2020 12:00 AM     Problem: Disease Progression (Hypertension)   Priority: High     Long-Range Goal: Disease Progression Prevented or Minimized   Start Date: 04/08/2020  Expected End Date: 04/08/2021  Recent Progress: On track  Priority: High  Note:   Objective:  Last practice recorded BP readings:  BP Readings from Last 3 Encounters:  07/25/20 (!) 175/78  05/28/20 132/68  05/28/20 132/68   Most recent eGFR/CrCl:  Lab Results  Component Value Date   EGFR 37 (L) 05/28/2020    No components found for: CRCL Current Barriers:  Knowledge Deficits related to disease process and Self Health management of HTN Chronic Disease Management support and education needs related to Essential HTN, CKDIII, Hypertensive nephropathy, prediabetes, NSTEMI  Nurse Case Manager Clinical Goal(s):  Patient will work with the CCM RN CM and PCP to address needs  related to disease education and support to help improve Self Health management of HTN Interventions:  07/07/20 completed successful outbound call with patient  Collaboration with Glendale Chard, MD regarding development and update of comprehensive plan of care as evidenced by provider attestation and co-signature Inter-disciplinary care team collaboration (see longitudinal plan of care) Provided education to patient about basic Hypertensive disease process Review of patient status, including review of consultant's  reports, relevant laboratory and other test results, and medications completed. Reviewed medications with patient and discussed importance of medication adherence Educated on dietary and exercise recommendations Discussed plans with patient for ongoing care management follow up and provided patient with direct contact information for care management team Reviewed and discussed next PCP OV with Dr. Baird Cancer scheduled for 09/24/20 _0 :00 AM Patient Self Care Activities:  Self administer medications as prescribed Attend all scheduled provider appointments Call pharmacy for medication refills Call provider office for new concerns or questions Continue to monitor BP at home and record as discussed Continue to adhere to dietary and exercise recommendations  Next Follow Up Date: 09/28/20    Care Plan : Chronic Kidney (Adult)  Updates made by Lynne Logan, RN since 07/27/2020 12:00 AM     Problem: Disease Progression   Priority: High     Long-Range Goal: Disease Progression Prevented or Minimized   Start Date: 07/07/2020  Expected End Date: 07/07/2021  This Visit's Progress: On track  Priority: High  Note:   Current Barriers:  Ineffective Self Health Maintenance  Clinical Goal(s):  Collaboration with Glendale Chard, MD regarding development and update of comprehensive plan of care as evidenced by provider attestation and co-signature Inter-disciplinary care team collaboration (see longitudinal plan of care) patient will work with care management team to address care coordination and chronic disease management needs related to Disease Management Educational Needs Care Coordination Medication Management and Education Psychosocial Support   Interventions:  07/07/20 completed successful outbound call with patient  Evaluation of current treatment plan related to CKD Stage III , self-management and patient's adherence to plan as established by provider. Collaboration with Glendale Chard, MD  regarding development and update of comprehensive plan of care as evidenced by provider attestation       and co-signature Inter-disciplinary care team collaboration (see longitudinal plan of care) Provided education to patient about basic disease process related to CKD  Review of patient status, including review of consultant's reports, relevant laboratory and other test results, and medications completed. Reviewed medications with patient and discussed importance of medication adherence Educated patient on importance of tracking symptoms related to CKD and increasing water intake to 64 oz daily unless otherwise directed  Discussed plans with patient for ongoing care management follow up and provided patient with direct contact information for care management team Mailed printed educational materials related to Stages of Chronic disease  Self Care Activities:  Patient verbalizes understanding of plan to increase water intake to 64oz daily unless otherwise directed Self administers medications as prescribed Attends all scheduled provider appointments Calls pharmacy for medication refills Calls provider office for new concerns or questions Patient Goals: - maintain and or improve kidney function  Follow Up Plan: Telephone follow up appointment with care management team member scheduled for: 09/28/20     Plan:Telephone follow up appointment with care management team member scheduled for:  09/28/20  Barb Merino, RN, BSN, CCM Care Management Coordinator [facility]  Direct Phone: [phone]

## 2020-07-29 ENCOUNTER — Telehealth: Payer: Self-pay | Admitting: Cardiology

## 2020-07-29 ENCOUNTER — Other Ambulatory Visit: Payer: Self-pay | Admitting: Cardiology

## 2020-07-29 DIAGNOSIS — I350 Nonrheumatic aortic (valve) stenosis: Secondary | ICD-10-CM

## 2020-07-29 DIAGNOSIS — R0609 Other forms of dyspnea: Secondary | ICD-10-CM

## 2020-07-29 DIAGNOSIS — E7800 Pure hypercholesterolemia, unspecified: Secondary | ICD-10-CM

## 2020-07-29 DIAGNOSIS — I10 Essential (primary) hypertension: Secondary | ICD-10-CM

## 2020-07-29 DIAGNOSIS — Z006 Encounter for examination for normal comparison and control in clinical research program: Secondary | ICD-10-CM

## 2020-07-29 DIAGNOSIS — R0600 Dyspnea, unspecified: Secondary | ICD-10-CM

## 2020-07-29 NOTE — Telephone Encounter (Signed)
ICD-10-CM   1. Research study patient: Esperion (Bempidoic Acid). End of study 07/29/2020  Z00.6     2. Essential hypertension  I10 PCV ECHOCARDIOGRAM COMPLETE    3. Pure hypercholesterolemia  E78.00 Lipid Panel With LDL/HDL Ratio    4. Mild aortic stenosis  I35.0 PCV ECHOCARDIOGRAM COMPLETE    5. Dyspnea on exertion  R06.00 Brain natriuretic peptide     Orders Placed This Encounter  Procedures   Lipid Panel With LDL/HDL Ratio    Standing Status:   Future    Standing Expiration Date:   07/29/2021   Brain natriuretic peptide    Standing Status:   Future    Standing Expiration Date:   09/28/2020   PCV ECHOCARDIOGRAM COMPLETE    Standing Status:   Future    Standing Expiration Date:   07/29/2021    Scheduling Instructions:     Prior to her next OV   Patient completed Espirion study and needs f/u lipids.  Due to chronic dyspnea, and also aortic stenosis, will obtain BNP and repeat echocardiogram.  She has office visit on 10/01/2020.   Adrian Prows, MD, [facility] 07/29/2020, 11:14 AM Office: [phone] Fax: [phone] Pager: [phone]

## 2020-08-24 ENCOUNTER — Ambulatory Visit: Payer: Medicare Other | Admitting: Cardiology

## 2020-08-26 ENCOUNTER — Other Ambulatory Visit: Payer: Medicare Other

## 2020-08-28 ENCOUNTER — Ambulatory Visit: Payer: Medicare Other

## 2020-08-28 ENCOUNTER — Other Ambulatory Visit: Payer: Self-pay

## 2020-08-28 DIAGNOSIS — I350 Nonrheumatic aortic (valve) stenosis: Secondary | ICD-10-CM

## 2020-08-28 DIAGNOSIS — I10 Essential (primary) hypertension: Secondary | ICD-10-CM | POA: Diagnosis not present

## 2020-08-30 NOTE — Progress Notes (Signed)
Echocardiogram 08/28/2020: Left ventricle cavity is normal in size. Severe concentric hypertrophy of the left ventricle. Normal global wall motion. Normal LV systolic function with visual EF 55-60%. Doppler evidence of grade I (impaired) diastolic dysfunction, normal LAP. Left atrial cavity is mildly dilated. Trileaflet aortic valve with mild calcification.  Mild aortic stenosis. Vmax 2.1 m/sec, mean PG 10 mmHg, AVA 1.6 cm2 by continuity equation. Moderate (Grade II) aortic regurgitation. Moderate (Grade II) mitral regurgitation. Mildly restricted mitral valve leaflets. Moderate tricuspid regurgitation. Estimated pulmonary artery systolic pressure 39 mmHg. No significant change compared to previous study in 2020.

## 2020-08-30 NOTE — Progress Notes (Signed)
NO change in echo

## 2020-09-02 ENCOUNTER — Encounter: Payer: Self-pay | Admitting: Internal Medicine

## 2020-09-02 NOTE — Progress Notes (Signed)
Echo stable compared to previous. Will discuss further at next office visit.

## 2020-09-02 NOTE — Progress Notes (Signed)
Patient called back she is aware

## 2020-09-17 ENCOUNTER — Other Ambulatory Visit: Payer: Self-pay | Admitting: Internal Medicine

## 2020-09-21 ENCOUNTER — Other Ambulatory Visit: Payer: Self-pay | Admitting: Internal Medicine

## 2020-09-21 DIAGNOSIS — B0229 Other postherpetic nervous system involvement: Secondary | ICD-10-CM

## 2020-09-24 ENCOUNTER — Ambulatory Visit: Payer: Medicare Other | Admitting: Internal Medicine

## 2020-09-28 ENCOUNTER — Telehealth: Payer: Medicare Other

## 2020-09-28 ENCOUNTER — Telehealth: Payer: Self-pay

## 2020-09-28 NOTE — Telephone Encounter (Signed)
  Care Management   Follow Up Note   09/28/2020 Name: Kathryn [name] MRN: [id number] DOB: 03-14-1940   Referred by: Glendale Chard, MD Reason for referral : Chronic Care Management (RN CM Follow up call )   An unsuccessful telephone outreach was attempted today. The patient was referred to the case management team for assistance with care management and care coordination.   Follow Up Plan: A HIPPA compliant phone message was left for the patient providing contact information and requesting a return call.   Barb Merino, RN, BSN, CCM Care Management Coordinator [place] [facility]  Direct Phone: [phone]

## 2020-09-29 NOTE — Progress Notes (Signed)
Primary Physician/Referring:  Glendale Chard, MD  Patient ID: [name], female    DOB: 1941/01/28, 80 y.o.   MRN: [id number]  Chief Complaint  Patient presents with   Hypertension   HLD   CAROTID STENOSIS   Follow-up    6 MONTH   HPI:    TASHEMA MCATEE  is a 80 y.o. AAFwith CAD s/p PCI to LCx 2009, hypertension, statin intolerant hyperlipidemia, CKD stage 3 and mild bilateral asymptomatic carotid stenosis. She has had an episode of syncope that occurred in May 2020 suggestive of vasovagal episodes, no recurrence. Coronary Angiography on 01/16/18 that showed no obstructive CAD and patent stents from 2009. Patient is not on statin due to intolerance, she is enrolled in esperion trial. Patient does have history of chronic left-sided post herpetic chest pain.  Patient presents for 81-monthfollow-up of hypertension, hyperlipidemia, CAD.  Last visit consider the addition of Zetia, however patient preferred to hold off and proceed with diet and lifestyle modifications first.  Patient has unfortunately not had repeat lipid profile testing done, however she has upcoming appointment next week with Dr. SBaird Cancerduring which she is expected to have lipid profile testing, have requested the patient have these results sent to our office for evaluation from cardiovascular standpoint.  Overall patient is feeling well without specific complaints today.  She does continue to have knee pain which limits her activity.Denies palpitations, dizziness, dyspnea, syncope, near syncope, orthopnea, PND, leg swelling.   Past Medical History:  Diagnosis Date   Anxiety    takes Xanax bid prn   Bruises easily    pt is on Plavix   Chronic back pain    buldging disc   Chronic neck pain    Coronary artery disease    1 stent    Dysphagia    Headache(784.0)    related to cervical issues   History of colonic polyps    Hyperlipidemia    takes Crestor daily   Hypertension    takes Coreg and Diovan  daily   Osteoporosis    was taking shot 2xyr;but not taking anymore   Peripheral vascular disease (HCC)    Seasonal allergies    takes Zyrtec nightly   Family History  Problem Relation Age of Onset   Heart disease Mother        Heart Dissease before age 338  Heart attack Mother    Cancer Father    Cancer Sister    Leukemia Sister    Cancer Brother    Heart disease Brother        Amputation   Heart attack Brother    Heart disease Brother    Heart attack Brother    Dementia Other    Anesthesia problems Neg Hx    Hypotension Neg Hx    Malignant hyperthermia Neg Hx    Pseudochol deficiency Neg Hx    Past Surgical History:  Procedure Laterality Date   ABDOMINAL HYSTERECTOMY  70's   BUNIONECTOMY     left foot   CARDIAC CATHETERIZATION  2009/2011   1 stent placed   CHOLECYSTECTOMY  2009   COLONOSCOPY     ESOPHAGOGASTRODUODENOSCOPY     LEFT HEART CATH AND CORONARY ANGIOGRAPHY N/A 01/16/2018   Procedure: LEFT HEART CATH AND CORONARY ANGIOGRAPHY;  Surgeon: PNigel Mormon MD;  [facility];  Service: Cardiovascular;  Laterality: N/A;   TONSILLECTOMY     at age 80  Social History   Tobacco  Use   Smoking status: Former    Packs/day: 0.25    Years: 5.00    Pack years: 1.25    Types: Cigarettes    Quit date: 11/27/1968    Years since quitting: 51.8   Smokeless tobacco: Never  Substance Use Topics   Alcohol use: No   Marital status: Married  ROS  Review of Systems  Cardiovascular:  Positive for chest pain (No recurrence, history of postherpetic pain) and dyspnea on exertion (stable). Negative for claudication, leg swelling, near-syncope, orthopnea, palpitations, paroxysmal nocturnal dyspnea and syncope.  Respiratory:  Negative for shortness of breath.   Musculoskeletal:  Positive for arthritis.  All other systems reviewed and are negative. Objective   Vitals with BMI 10/01/2020 07/25/2020 05/28/2020  Height 5' 1.8" - 5' 1.8"  Weight 217 lbs 10  oz - 222 lbs 10 oz  BMI [id number] - [id number]  Systolic [id number]  Diastolic 71 78 68  Pulse 68 84 62      Physical Exam Constitutional:      Appearance: She is obese.     Comments: Moderately built and moderately obese in no acute distress.  HENT:     Head: Normocephalic and atraumatic.  Neck:     Thyroid: No thyromegaly.     Comments: Short neck Cardiovascular:     Rate and Rhythm: Normal rate and regular rhythm.     Pulses: Intact distal pulses.          Carotid pulses are  on the right side with bruit and  on the left side with bruit.    Heart sounds: Murmur heard.  Harsh midsystolic murmur is present with a grade of 3/6 at the upper right sternal border radiating to the neck.    No gallop.     Comments: No edema. No JVD Pulmonary:     Effort: Pulmonary effort is normal.     Breath sounds: Normal breath sounds.  Abdominal:     Comments: Obese with mild pannus  Musculoskeletal:        General: Normal range of motion.     Right lower leg: No edema.     Left lower leg: No edema.  Skin:    Capillary Refill: Capillary refill takes less than 2 seconds.  Neurological:     Mental Status: She is alert.   Laboratory examination:    Recent Labs    12/30/19 1235 05/28/20 1128  NA 144 146*  K 4.6 4.9  CL 104 106  CO2 24 22  GLUCOSE 87 100*  BUN 20 26  CREATININE 1.28* 1.41*  CALCIUM 9.5 9.5  GFRNONAA 40*  --   GFRAA 46*  --    CrCl cannot be calculated (Patient's most recent lab result is older than the maximum 21 days allowed.).  CMP Latest Ref Rng & Units 05/28/2020 12/30/2019 04/23/2019  Glucose 65 - 99 mg/dL 100(H) 87 87  BUN 8 - 27 mg/dL '26 20 17  '$ Creatinine 0.57 - 1.00 mg/dL 1.41(H) 1.28(H) 1.53(H)  Sodium 134 - 144 mmol/L 146(H) 144 142  Potassium 3.5 - 5.2 mmol/L 4.9 4.6 4.8  Chloride 96 - 106 mmol/L 106 104 104  CO2 20 - 29 mmol/L '22 24 22  '$ Calcium 8.7 - 10.3 mg/dL 9.5 9.5 10.1  Total Protein 6.0 - 8.5 g/dL 6.7 - 7.2  Total Bilirubin 0.0 - 1.2 mg/dL 0.3 - 0.4   Alkaline Phos 44 - 121 IU/L 127(H) - 99  AST 0 - 40 IU/L 19 -  18  ALT 0 - 32 IU/L 12 - 11   CBC Latest Ref Rng & Units 04/23/2019 01/22/2019 06/20/2018  WBC 3.4 - 10.8 x10E3/uL 8.4 9.3 7.5  Hemoglobin 11.1 - 15.9 g/dL 12.5 11.0(L) 11.0(L)  Hematocrit 34.0 - 46.6 % 40.4 35.8 35.1  Platelets 150 - 450 x10E3/uL 463(H) 405 429   Lipid Panel     Component Value Date/Time   CHOL 211 (H) 04/17/2020 1156   TRIG 86 04/17/2020 1156   HDL 50 04/17/2020 1156   CHOLHDL 3.0 02/13/2020 1602   LDLCALC 146 (H) 04/17/2020 1156   HEMOGLOBIN A1C Lab Results  Component Value Date   HGBA1C 5.6 12/30/2019   TSH No results for input(s): TSH in the last 8760 hours. Allergies   Allergies  Allergen Reactions   Codeine Hypertension   Shellfish Allergy     Medications Prior to Visit:   Outpatient Medications Prior to Visit  Medication Sig Dispense Refill   acetaminophen (TYLENOL) 325 MG tablet Take 325 mg by mouth every 6 (six) hours as needed for mild pain.     ALPRAZolam (XANAX) 0.25 MG tablet TAKE (1) TABLET BY MOUTH TWICE DAILY AS NEEDED FOR ANXIETY. 30 tablet 0   amLODipine (NORVASC) 10 MG tablet TAKE ONE TABLET BY MOUTH ONCE DAILY. 90 tablet 1   Apoaequorin (PREVAGEN PO) Take 1 tablet by mouth daily.     Aspirin 81 MG CAPS      CALCIUM PO Take 1 tablet by mouth daily. 600 mg     carvedilol (COREG) 12.5 MG tablet Take 1 tablet (12.5 mg total) by mouth 2 (two) times daily with a meal. 180 tablet 3   cetirizine (ZYRTEC) 10 MG tablet Take 10 mg by mouth at bedtime.     ezetimibe (ZETIA) 10 MG tablet Take 1 tablet (10 mg total) by mouth daily. 30 tablet 11   ferrous sulfate 324 MG TBEC Take 324 mg by mouth every other day.     furosemide (LASIX) 20 MG tablet Take 1 tablet (20 mg total) by mouth daily as needed for fluid. 90 tablet 0   levocetirizine (XYZAL) 5 MG tablet Take 5 mg by mouth every evening.     Magnesium 250 MG TABS Take 1 tablet by mouth daily.     meloxicam (MOBIC) 7.5 MG tablet Take  7.5 mg by mouth daily.     Multiple Vitamin (MULTIVITAMIN WITH MINERALS) TABS Take 1 tablet by mouth daily.     nitroGLYCERIN (NITROSTAT) 0.4 MG SL tablet Place 1 tablet (0.4 mg total) under the tongue every 5 (five) minutes x 3 doses as needed for chest pain. 30 tablet 3   pravastatin (PRAVACHOL) 40 MG tablet TAKE 1 TABLET WEEKLY. (SAME DAY EACH WEEK) 13 tablet 1   pregabalin (LYRICA) 75 MG capsule TAKE 1 CAPSULE BY MOUTH THREE TIMES A DAY. 90 capsule 3   triamcinolone cream (KENALOG) 0.1 % Apply 1 application topically 2 (two) times daily. 30 g 0   valACYclovir (VALTREX) 1000 MG tablet Take 1 tablet (1,000 mg total) by mouth 3 (three) times daily. 21 tablet 0   Investigational - Study Medication Take 1 tablet by mouth daily. Study name:008 Additional study details:For cholesterol (Dr. Skip Estimable)     psyllium (REGULOID) 0.52 g capsule Take 0.52 g by mouth 2 (two) times daily.     No facility-administered medications prior to visit.   Final Medications at End of Visit    Current Meds  Medication Sig   acetaminophen (TYLENOL) 325  MG tablet Take 325 mg by mouth every 6 (six) hours as needed for mild pain.   ALPRAZolam (XANAX) 0.25 MG tablet TAKE (1) TABLET BY MOUTH TWICE DAILY AS NEEDED FOR ANXIETY.   amLODipine (NORVASC) 10 MG tablet TAKE ONE TABLET BY MOUTH ONCE DAILY.   Apoaequorin (PREVAGEN PO) Take 1 tablet by mouth daily.   Aspirin 81 MG CAPS    CALCIUM PO Take 1 tablet by mouth daily. 600 mg   carvedilol (COREG) 12.5 MG tablet Take 1 tablet (12.5 mg total) by mouth 2 (two) times daily with a meal.   cetirizine (ZYRTEC) 10 MG tablet Take 10 mg by mouth at bedtime.   ezetimibe (ZETIA) 10 MG tablet Take 1 tablet (10 mg total) by mouth daily.   ferrous sulfate 324 MG TBEC Take 324 mg by mouth every other day.   furosemide (LASIX) 20 MG tablet Take 1 tablet (20 mg total) by mouth daily as needed for fluid.   levocetirizine (XYZAL) 5 MG tablet Take 5 mg by mouth every evening.   Magnesium 250  MG TABS Take 1 tablet by mouth daily.   meloxicam (MOBIC) 7.5 MG tablet Take 7.5 mg by mouth daily.   Multiple Vitamin (MULTIVITAMIN WITH MINERALS) TABS Take 1 tablet by mouth daily.   nitroGLYCERIN (NITROSTAT) 0.4 MG SL tablet Place 1 tablet (0.4 mg total) under the tongue every 5 (five) minutes x 3 doses as needed for chest pain.   pravastatin (PRAVACHOL) 40 MG tablet TAKE 1 TABLET WEEKLY. (SAME DAY EACH WEEK)   pregabalin (LYRICA) 75 MG capsule TAKE 1 CAPSULE BY MOUTH THREE TIMES A DAY.   triamcinolone cream (KENALOG) 0.1 % Apply 1 application topically 2 (two) times daily.   valACYclovir (VALTREX) 1000 MG tablet Take 1 tablet (1,000 mg total) by mouth 3 (three) times daily.   Radiology:  No results found.   Cardiac Studies:   Sleep Study  [2011]: Negative for sleep apnea  Coronary Angiogram   01/16/18: Patent Sypher stent in Cx, 2.75 x 15 mm DES placed on 01/22/08. Normal LVEF. NO other significant CAD.  Carotid artery duplex 05/12/2020:  Stenosis in the right internal carotid artery (>=70%).  The right PSV internal/common carotid artery ratio of 7.42 is consistent  with a stenosis of >70%.  Stenosis in the left internal carotid artery (16-49%). Stenosis in the  right external carotid artery (<50%).  Antegrade right vertebral artery flow. Antegrade left vertebral artery  flow.  Follow up in six months is appropriate if clinically indicated. No  significant change from 04/17/2020.   Echocardiogram 08/28/2020:  Left ventricle cavity is normal in size. Severe concentric hypertrophy of  the left ventricle. Normal global wall motion. Normal LV systolic function  with visual EF 55-60%. Doppler evidence of grade I (impaired) diastolic  dysfunction, normal LAP.  Left atrial cavity is mildly dilated.  Trileaflet aortic valve with mild calcification.  Mild aortic stenosis.  Vmax 2.1 m/sec, mean PG 10 mmHg, AVA 1.6 cm2 by continuity equation.  Moderate (Grade II) aortic regurgitation.   Moderate (Grade II) mitral regurgitation. Mildly restricted mitral valve  leaflets.  Moderate tricuspid regurgitation. Estimated pulmonary artery systolic  pressure 39 mmHg.  No significant change compared to previous study in 2020.   EKG  10/01/2020: Sinus rhythm with single PAC at a rate of 65 bpm.  Normal axis.  Left atrial enlargement.  Compared to EKG 04/03/2020, normal axis and no poor R wave progression  EKG 04/03/2020: Sinus rhythm at a rate of  79 bpm, left atrial enlargement. Left axis, left anterior fascicular block. Poor R wave progression, cannot exclude anteroseptal infarct old. No evidence of underlying ischemia or injury pattern. Compared to EKG 03/18/2020, left axis and left anterior fascicular block noted.  EKG 03/18/2020: Sinus rhythm rate of 72 bpm, left atrial enlargement. Normal axis. Poor R wave progression, cannot exclude anteroseptal infarct old. Compared to EKG 11/28/2018, no significant change.  EKG 08/23/2019: Normal sinus rhythm with rate of 75 bpm, normal axis.  No evidence of ischemia, normal EKG.   Assessment     ICD-10-CM   1. Coronary artery disease of native artery of native heart with stable angina pectoris ([place])  I25.118     2. Essential hypertension  I10 EKG 12-Lead    3. Pure hypercholesterolemia  E78.00     4. Carotid stenosis, asymptomatic, bilateral  I65.23       No orders of the defined types were placed in this encounter.  Medications Discontinued During This Encounter  Medication Reason   Investigational - Study Medication Error   psyllium (REGULOID) 0.52 g capsule Error    Recommendations:    TYAHNA SHOVER  is a 80 y.o. 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 She has had an episode of syncope that occurred in May 2020 suggestiv of vasovagal episodes, no recurrence.    Coronary Angiography on 01/16/18 that showed no obstructive CAD and  patent stents from 2009. Patient is not on statin due to intolerance, she is enrolled in esperion trial.   Patient presents for 67-monthfollow-up of hypertension, hyperlipidemia, CAD.  Patient is presently taking pravastatin and Zetia for hyperlipidemia.  She is due for repeat lipid profile testing, however she has a scheduled next week with Dr. SBaird Cancer have requested that results be shared with our office.  Will not make changes to medications at this time.  Patient's blood pressure is well controlled and she remains relatively asymptomatic without clinical evidence of heart failure.  Reviewed and discussed with patient results of echocardiogram and carotid artery duplex, details above.  Carotid artery stenosis has remained stable, will continue surveillance, repeat in 6 months.  Echocardiogram is stable compared to 2022 with normal LVEF, grade 1 diastolic dysfunction, as well as moderate AR, MR and TR.    Follow-up in 6 months, sooner if needed, for hypertension, hyperlipidemia, CAD, carotid disease.   CAlethia Berthold PA-C 10/01/2020, 12:07 PM Office: [phone]

## 2020-10-01 ENCOUNTER — Other Ambulatory Visit: Payer: Self-pay

## 2020-10-01 ENCOUNTER — Encounter: Payer: Self-pay | Admitting: Student

## 2020-10-01 ENCOUNTER — Ambulatory Visit: Payer: Medicare Other | Admitting: Student

## 2020-10-01 VITALS — BP 123/71 | HR 68 | Temp 97.9°F | Resp 17 | Ht 61.8 in | Wt 217.6 lb

## 2020-10-01 DIAGNOSIS — E7800 Pure hypercholesterolemia, unspecified: Secondary | ICD-10-CM

## 2020-10-01 DIAGNOSIS — I10 Essential (primary) hypertension: Secondary | ICD-10-CM | POA: Diagnosis not present

## 2020-10-01 DIAGNOSIS — I6523 Occlusion and stenosis of bilateral carotid arteries: Secondary | ICD-10-CM

## 2020-10-01 DIAGNOSIS — I25118 Atherosclerotic heart disease of native coronary artery with other forms of angina pectoris: Secondary | ICD-10-CM | POA: Diagnosis not present

## 2020-10-05 ENCOUNTER — Ambulatory Visit (INDEPENDENT_AMBULATORY_CARE_PROVIDER_SITE_OTHER): Payer: Medicare Other | Admitting: Internal Medicine

## 2020-10-05 ENCOUNTER — Other Ambulatory Visit: Payer: Self-pay

## 2020-10-05 ENCOUNTER — Encounter: Payer: Self-pay | Admitting: Internal Medicine

## 2020-10-05 VITALS — BP 118/72 | HR 86 | Temp 98.1°F | Ht 62.2 in | Wt 219.2 lb

## 2020-10-05 DIAGNOSIS — R7309 Other abnormal glucose: Secondary | ICD-10-CM

## 2020-10-05 DIAGNOSIS — N1832 Chronic kidney disease, stage 3b: Secondary | ICD-10-CM

## 2020-10-05 DIAGNOSIS — F4321 Adjustment disorder with depressed mood: Secondary | ICD-10-CM

## 2020-10-05 DIAGNOSIS — I1310 Hypertensive heart and chronic kidney disease without heart failure, with stage 1 through stage 4 chronic kidney disease, or unspecified chronic kidney disease: Secondary | ICD-10-CM | POA: Diagnosis not present

## 2020-10-05 DIAGNOSIS — I25118 Atherosclerotic heart disease of native coronary artery with other forms of angina pectoris: Secondary | ICD-10-CM | POA: Diagnosis not present

## 2020-10-05 DIAGNOSIS — Z6839 Body mass index (BMI) 39.0-39.9, adult: Secondary | ICD-10-CM

## 2020-10-05 NOTE — Patient Instructions (Signed)

## 2020-10-05 NOTE — Progress Notes (Signed)
I,Yamilka [employer] as a Education administrator for [name], MD.,have documented all relevant documentation on the behalf of [name], MD,as directed by  [name], MD while in the presence of [name], MD.  This visit occurred during the SARS-CoV-2 public health emergency.  Safety protocols were in place, including screening questions prior to the visit, additional usage of staff PPE, and extensive cleaning of exam room while observing appropriate contact time as indicated for disinfecting solutions.  Subjective:     Patient ID: Kathryn [name] , female    DOB: [birth year] , 80 y.o.   MRN: [id number]   Chief Complaint  Patient presents with   Hypertension   Weight Check    HPI  She presents for BP check. She reports compliance with meds.  She denies headaches, chest pain and shortness of breath. She is upset today b/c her friend died last week. States her friend committed suicide. She is surprised by this and does not understand why this happened.   Hypertension This is a chronic problem. The current episode started more than 1 year ago. The problem has been gradually improving since onset. The problem is controlled. Pertinent negatives include no blurred vision, chest pain, palpitations or shortness of breath. Risk factors for coronary artery disease include dyslipidemia, obesity, post-menopausal state and sedentary lifestyle. The current treatment provides moderate improvement. Compliance problems include exercise.  Hypertensive end-organ damage includes kidney disease and CAD/MI.    Past Medical History:  Diagnosis Date   Anxiety    takes Xanax bid prn   Bruises easily    pt is on Plavix   Chronic back pain    buldging disc   Chronic neck pain    Coronary artery disease    1 stent    Dysphagia    Headache(784.0)    related to cervical issues   History of colonic polyps    Hyperlipidemia    takes Crestor daily   Hypertension    takes Coreg and Diovan  daily   Osteoporosis    was taking shot 2xyr;but not taking anymore   Peripheral vascular disease (HCC)    Seasonal allergies    takes Zyrtec nightly     Family History  Problem Relation Age of Onset   Heart disease Mother        Heart Dissease before age 23   Heart attack Mother    Cancer Father    Cancer Sister    Leukemia Sister    Cancer Brother    Heart disease Brother        Amputation   Heart attack Brother    Heart disease Brother    Heart attack Brother    Dementia Other    Anesthesia problems Neg Hx    Hypotension Neg Hx    Malignant hyperthermia Neg Hx    Pseudochol deficiency Neg Hx      Current Outpatient Medications:    acetaminophen (TYLENOL) 325 MG tablet, Take 325 mg by mouth every 6 (six) hours as needed for mild pain., Disp: , Rfl:    ALPRAZolam (XANAX) 0.25 MG tablet, TAKE (1) TABLET BY MOUTH TWICE DAILY AS NEEDED FOR ANXIETY., Disp: 30 tablet, Rfl: 0   amLODipine (NORVASC) 10 MG tablet, TAKE ONE TABLET BY MOUTH ONCE DAILY., Disp: 90 tablet, Rfl: 1   Apoaequorin (PREVAGEN PO), Take 1 tablet by mouth daily., Disp: , Rfl:    Aspirin 81 MG CAPS, , Disp: , Rfl:  CALCIUM PO, Take 1 tablet by mouth daily. 600 mg, Disp: , Rfl:    carvedilol (COREG) 12.5 MG tablet, Take 1 tablet (12.5 mg total) by mouth 2 (two) times daily with a meal., Disp: 180 tablet, Rfl: 3   cetirizine (ZYRTEC) 10 MG tablet, Take 10 mg by mouth at bedtime., Disp: , Rfl:    ezetimibe (ZETIA) 10 MG tablet, Take 1 tablet (10 mg total) by mouth daily., Disp: 30 tablet, Rfl: 11   ferrous sulfate 324 MG TBEC, Take 324 mg by mouth every other day., Disp: , Rfl:    furosemide (LASIX) 20 MG tablet, Take 1 tablet (20 mg total) by mouth daily as needed for fluid., Disp: 90 tablet, Rfl: 0   levocetirizine (XYZAL) 5 MG tablet, Take 5 mg by mouth every evening., Disp: , Rfl:    Magnesium 250 MG TABS, Take 1 tablet by mouth daily., Disp: , Rfl:    meloxicam (MOBIC) 7.5 MG tablet, Take 7.5 mg by mouth  daily., Disp: , Rfl:    Multiple Vitamin (MULTIVITAMIN WITH MINERALS) TABS, Take 1 tablet by mouth daily., Disp: , Rfl:    nitroGLYCERIN (NITROSTAT) 0.4 MG SL tablet, Place 1 tablet (0.4 mg total) under the tongue every 5 (five) minutes x 3 doses as needed for chest pain., Disp: 30 tablet, Rfl: 3   pravastatin (PRAVACHOL) 40 MG tablet, TAKE 1 TABLET WEEKLY. (SAME DAY EACH WEEK), Disp: 13 tablet, Rfl: 1   pregabalin (LYRICA) 75 MG capsule, TAKE 1 CAPSULE BY MOUTH THREE TIMES A DAY., Disp: 90 capsule, Rfl: 3   triamcinolone cream (KENALOG) 0.1 %, Apply 1 application topically 2 (two) times daily., Disp: 30 g, Rfl: 0   valACYclovir (VALTREX) 1000 MG tablet, Take 1 tablet (1,000 mg total) by mouth 3 (three) times daily., Disp: 21 tablet, Rfl: 0   Allergies  Allergen Reactions   Codeine Hypertension   Shellfish Allergy      Review of Systems  Constitutional: Negative.   Eyes:  Negative for blurred vision.  Respiratory: Negative.  Negative for shortness of breath.   Cardiovascular: Negative.  Negative for chest pain and palpitations.  Gastrointestinal: Negative.   Neurological: Negative.   Psychiatric/Behavioral:  Positive for dysphoric mood.     Today's Vitals   10/05/20 1145  BP: 118/72  Pulse: 86  Temp: 98.1 F (36.7 C)  Weight: 219 lb 3.2 oz (99.4 kg)  Height: 5' 2.2" (1.58 m)  PainSc: 7   PainLoc: Leg   Body mass index is 39.83 kg/m.  Wt Readings from Last 3 Encounters:  10/05/20 219 lb 3.2 oz (99.4 kg)  10/01/20 217 lb 9.6 oz (98.7 kg)  05/28/20 222 lb 9.6 oz (101 kg)     Objective:  Physical Exam Vitals and nursing note reviewed.  Constitutional:      Appearance: Normal appearance.  HENT:     Head: Normocephalic and atraumatic.     Nose:     Comments: Masked     Mouth/Throat:     Comments: Masked  Cardiovascular:     Rate and Rhythm: Normal rate and regular rhythm.     Heart sounds: Murmur heard.  Pulmonary:     Effort: Pulmonary effort is normal.     Breath  sounds: Normal breath sounds.  Musculoskeletal:     Cervical back: Normal range of motion.  Skin:    General: Skin is warm.  Neurological:     General: No focal deficit present.     Mental Status: She  is alert.  Psychiatric:        Mood and Affect: Mood normal.        Behavior: Behavior normal.        Assessment And Plan:     1. Hypertensive heart and renal disease with renal failure, stage 1 through stage 4 or unspecified chronic kidney disease, without heart failure Comments: Chronic, I wil lcheck renal labs as listed below. I will forward this to her nephrologist at [facility].  - CMP14+EGFR - Phosphorus - Parathyroid Hormone, Intact w/Ca - CBC no Diff - Protein electrophoresis, serum  2. Stage 3b chronic kidney disease ([place]) Comments: Chronic, I will check labs as listed below. Encouraged to keep BP controlled and stay well hydrated to limit risk of progression of CKD.  - CMP14+EGFR - Phosphorus - Parathyroid Hormone, Intact w/Ca - CBC no Diff - Protein electrophoresis, serum  3. Atherosclerosis of native coronary artery of native heart with stable angina pectoris ([place]) Comments: Chronic, advised to follow heart healthy lifestyle.   4. Other abnormal glucose Comments: Her a1c has been elevated in the past. I will recheck an a1c today. She is encouraged to limit her intake of sweetened beverages.  - Hemoglobin A1c - Insulin, random([phone])  5. Grief Comments: Unfortunately, she declines grief counseling at this time.   6. Class 2 severe obesity due to excess calories with serious comorbidity and body mass index (BMI) of 39.0 to 39.9 in adult [facility]) Comments: She is encouraged to aim for at least 150 minutes of exercise per week. Advised to strive to lose ten percent of her body weight over the next 4-6 months.     Patient was given opportunity to ask questions. Patient verbalized understanding of the plan and was able to repeat key elements of the plan. All  questions were answered to their satisfaction.   I, [name], MD, have reviewed all documentation for this visit. The documentation on 10/05/20 for the exam, diagnosis, procedures, and orders are all accurate and complete.   IF YOU HAVE BEEN REFERRED TO A SPECIALIST, IT MAY TAKE 1-2 WEEKS TO SCHEDULE/PROCESS THE REFERRAL. IF YOU HAVE NOT HEARD FROM US/SPECIALIST IN TWO WEEKS, PLEASE GIVE [place] A CALL AT [phone].   THE PATIENT IS ENCOURAGED TO PRACTICE SOCIAL DISTANCING DUE TO THE COVID-19 PANDEMIC.

## 2020-10-07 LAB — CMP14+EGFR
ALT: 14 IU/L (ref 0–32)
AST: 17 IU/L (ref 0–40)
Albumin/Globulin Ratio: 1.7 (ref 1.2–2.2)
Albumin: 4 g/dL (ref 3.6–4.6)
Alkaline Phosphatase: 138 IU/L — ABNORMAL HIGH (ref 44–121)
BUN/Creatinine Ratio: 11 — ABNORMAL LOW (ref 12–28)
BUN: 12 mg/dL (ref 8–27)
Bilirubin Total: 0.4 mg/dL (ref 0.0–1.2)
CO2: 22 mmol/L (ref 20–29)
Calcium: 9.1 mg/dL (ref 8.7–10.3)
Chloride: 105 mmol/L (ref 96–106)
Creatinine, Ser: 1.09 mg/dL — ABNORMAL HIGH (ref 0.57–1.00)
Globulin, Total: 2.3 g/dL (ref 1.5–4.5)
Glucose: 98 mg/dL (ref 65–99)
Potassium: 5.2 mmol/L (ref 3.5–5.2)
Sodium: 142 mmol/L (ref 134–144)
Total Protein: 6.3 g/dL (ref 6.0–8.5)
eGFR: 51 mL/min/{1.73_m2} — ABNORMAL LOW (ref >59)

## 2020-10-07 LAB — PROTEIN ELECTROPHORESIS, SERUM
A/G Ratio: 1.3 (ref 0.7–1.7)
Albumin ELP: 3.6 g/dL (ref 2.9–4.4)
Alpha 1: 0.2 g/dL (ref 0.0–0.4)
Alpha 2: 0.8 g/dL (ref 0.4–1.0)
Beta: 0.9 g/dL (ref 0.7–1.3)
Gamma Globulin: 0.9 g/dL (ref 0.4–1.8)
Globulin, Total: 2.7 g/dL (ref 2.2–3.9)

## 2020-10-07 LAB — CBC
Hematocrit: 37.3 % (ref 34.0–46.6)
Hemoglobin: 11.8 g/dL (ref 11.1–15.9)
MCH: 22.7 pg — ABNORMAL LOW (ref 26.6–33.0)
MCHC: 31.6 g/dL (ref 31.5–35.7)
MCV: 72 fL — ABNORMAL LOW (ref 79–97)
Platelets: 393 10*3/uL (ref 150–450)
RBC: 5.19 x10E6/uL (ref 3.77–5.28)
RDW: 15.8 % — ABNORMAL HIGH (ref 11.7–15.4)
WBC: 7.9 10*3/uL (ref 3.4–10.8)

## 2020-10-07 LAB — HEMOGLOBIN A1C
Est. average glucose Bld gHb Est-mCnc: 120 mg/dL
Hgb A1c MFr Bld: 5.8 % — ABNORMAL HIGH (ref 4.8–5.6)

## 2020-10-07 LAB — PHOSPHORUS: Phosphorus: 3.3 mg/dL (ref 3.0–4.3)

## 2020-10-07 LAB — PTH, INTACT AND CALCIUM: PTH: 36 pg/mL (ref 15–65)

## 2020-10-07 LAB — INSULIN, RANDOM: INSULIN: 26.4 u[IU]/mL — ABNORMAL HIGH (ref 2.6–24.9)

## 2020-10-11 ENCOUNTER — Other Ambulatory Visit: Payer: Self-pay | Admitting: Student

## 2020-10-11 DIAGNOSIS — I25118 Atherosclerotic heart disease of native coronary artery with other forms of angina pectoris: Secondary | ICD-10-CM

## 2020-10-15 DIAGNOSIS — D631 Anemia in chronic kidney disease: Secondary | ICD-10-CM | POA: Diagnosis not present

## 2020-10-15 DIAGNOSIS — N1831 Chronic kidney disease, stage 3a: Secondary | ICD-10-CM | POA: Diagnosis not present

## 2020-10-15 DIAGNOSIS — I5032 Chronic diastolic (congestive) heart failure: Secondary | ICD-10-CM | POA: Diagnosis not present

## 2020-10-15 DIAGNOSIS — M4800 Spinal stenosis, site unspecified: Secondary | ICD-10-CM | POA: Diagnosis not present

## 2020-10-15 DIAGNOSIS — I129 Hypertensive chronic kidney disease with stage 1 through stage 4 chronic kidney disease, or unspecified chronic kidney disease: Secondary | ICD-10-CM | POA: Diagnosis not present

## 2020-10-15 DIAGNOSIS — R7303 Prediabetes: Secondary | ICD-10-CM | POA: Diagnosis not present

## 2020-10-16 ENCOUNTER — Telehealth: Payer: Medicare Other

## 2020-10-16 ENCOUNTER — Telehealth: Payer: Self-pay

## 2020-10-16 NOTE — Telephone Encounter (Signed)
  Care Management   Follow Up Note   10/16/2020 Name: Kathryn [name] MRN: [id number] DOB: [birth year]   Referred by: Glendale Chard, MD Reason for referral : Chronic Care Management (RN CM follow up call )   An unsuccessful telephone outreach was attempted today. The patient was referred to the case management team for assistance with care management and care coordination.   Follow Up Plan: A HIPPA compliant phone message was left for the patient providing contact information and requesting a return call.   Barb Merino, RN, BSN, CCM Care Management Coordinator [place] [facility]  Direct Phone: [phone]

## 2020-10-28 ENCOUNTER — Other Ambulatory Visit: Payer: Self-pay | Admitting: Internal Medicine

## 2020-10-29 ENCOUNTER — Other Ambulatory Visit (HOSPITAL_COMMUNITY): Payer: Self-pay | Admitting: Internal Medicine

## 2020-10-29 DIAGNOSIS — Z1231 Encounter for screening mammogram for malignant neoplasm of breast: Secondary | ICD-10-CM

## 2020-11-04 ENCOUNTER — Ambulatory Visit (INDEPENDENT_AMBULATORY_CARE_PROVIDER_SITE_OTHER): Payer: Medicare Other

## 2020-11-04 ENCOUNTER — Telehealth: Payer: Medicare Other

## 2020-11-04 DIAGNOSIS — R7303 Prediabetes: Secondary | ICD-10-CM

## 2020-11-04 DIAGNOSIS — I1310 Hypertensive heart and chronic kidney disease without heart failure, with stage 1 through stage 4 chronic kidney disease, or unspecified chronic kidney disease: Secondary | ICD-10-CM

## 2020-11-04 DIAGNOSIS — N1832 Chronic kidney disease, stage 3b: Secondary | ICD-10-CM

## 2020-11-04 DIAGNOSIS — M4807 Spinal stenosis, lumbosacral region: Secondary | ICD-10-CM

## 2020-11-04 DIAGNOSIS — I214 Non-ST elevation (NSTEMI) myocardial infarction: Secondary | ICD-10-CM

## 2020-11-11 NOTE — Patient Instructions (Signed)
Visit Information  PATIENT GOALS:  Goals Addressed       Patient Stated     Long-Range: Chronic Low Back Pain Managed (pt-stated)   On track     Timeframe:  Short-Term Goal Priority:  High Start Date:  03/10/20                           Expected End Date:  03/18/21  Follow Up Date: 03/18/21  Patient Goals:  - keep all MD f/u appointments with Dr. Mina Marble for ongoing evaluation of her Sciatica pain  - continue to follow HEP as directed                       Other     Chronic Kidney disease - disease progression prevented or minimized   On track     Timeframe:  Long-Range Goal Priority:  High Start Date:  07/07/20                           Expected End Date: 07/07/21  Next Scheduled Follow up date: 03/18/21        - increase water to 64 oz daily - keep BP under good control per parameters set by MD                Hypertension - Disease Progression Prevented or Minimized   On track     Timeframe:  Long-Range Goal Priority:  High Start Date:  04/08/20                           Expected End Date:  04/08/21   Follow Up Date: 04/08/20  Patient Goals:  Continue to monitor BP at home and record as discussed Continue to adhere to dietary and exercise recommendations                      The patient verbalized understanding of instructions, educational materials, and care plan provided today and declined offer to receive copy of patient instructions, educational materials, and care plan.   Telephone follow up appointment with care management team member scheduled for: 03/18/21  Barb Merino, RN, BSN, CCM Care Management Coordinator [facility]  Direct Phone: [phone]

## 2020-11-11 NOTE — Chronic Care Management (AMB) (Signed)
Chronic Care Management   CCM RN Visit Note  11/04/2020 Name: Kathryn [name] MRN: [id number] DOB: [birth year]  Subjective: Kathryn [name] is a 80 y.o. year old female who is a primary care patient of Glendale Chard, MD. The care management team was consulted for assistance with disease management and care coordination needs.    Engaged with patient by telephone for follow up visit in response to provider referral for case management and/or care coordination services.   Consent to Services:  The patient was given information about Chronic Care Management services, agreed to services, and gave verbal consent prior to initiation of services.  Please see initial visit note for detailed documentation.   Patient agreed to services and verbal consent obtained.   Assessment: Review of patient past medical history, allergies, medications, health status, including review of consultants reports, laboratory and other test data, was performed as part of comprehensive evaluation and provision of chronic care management services.   SDOH (Social Determinants of Health) assessments and interventions performed:    CCM Care Plan  Allergies  Allergen Reactions   Codeine Hypertension   Shellfish Allergy     Outpatient Encounter Medications as of 11/04/2020  Medication Sig   acetaminophen (TYLENOL) 325 MG tablet Take 325 mg by mouth every 6 (six) hours as needed for mild pain.   ALPRAZolam (XANAX) 0.25 MG tablet TAKE (1) TABLET BY MOUTH TWICE DAILY AS NEEDED FOR ANXIETY.   amLODipine (NORVASC) 10 MG tablet TAKE ONE TABLET BY MOUTH ONCE DAILY.   Apoaequorin (PREVAGEN PO) Take 1 tablet by mouth daily.   Aspirin 81 MG CAPS    CALCIUM PO Take 1 tablet by mouth daily. 600 mg   carvedilol (COREG) 12.5 MG tablet TAKE (1) TABLET BY MOUTH TWICE DAILY.   cetirizine (ZYRTEC) 10 MG tablet Take 10 mg by mouth at bedtime.   ezetimibe (ZETIA) 10 MG tablet Take 1 tablet (10 mg total) by mouth daily.    ferrous sulfate 324 MG TBEC Take 324 mg by mouth every other day.   furosemide (LASIX) 20 MG tablet Take 1 tablet (20 mg total) by mouth daily as needed for fluid.   levocetirizine (XYZAL) 5 MG tablet Take 5 mg by mouth every evening.   Magnesium 250 MG TABS Take 1 tablet by mouth daily.   meloxicam (MOBIC) 7.5 MG tablet Take 7.5 mg by mouth daily.   Multiple Vitamin (MULTIVITAMIN WITH MINERALS) TABS Take 1 tablet by mouth daily.   nitroGLYCERIN (NITROSTAT) 0.4 MG SL tablet Place 1 tablet (0.4 mg total) under the tongue every 5 (five) minutes x 3 doses as needed for chest pain.   pravastatin (PRAVACHOL) 40 MG tablet TAKE 1 TABLET WEEKLY. (SAME DAY EACH WEEK)   pregabalin (LYRICA) 75 MG capsule TAKE 1 CAPSULE BY MOUTH THREE TIMES A DAY.   triamcinolone cream (KENALOG) 0.1 % Apply 1 application topically 2 (two) times daily.   valACYclovir (VALTREX) 1000 MG tablet Take 1 tablet (1,000 mg total) by mouth 3 (three) times daily.   No facility-administered encounter medications on file as of 11/04/2020.    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   Chronic renal disease, 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   Other abnormal glucose 01/15/2018   Neuralgia, 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   NSTEMI (non-ST elevated myocardial infarction) ([place]) 01/14/2018   Essential hypertension 01/14/2018  Hyperlipidemia 01/14/2018   Carotid stenosis, asymptomatic, bilateral 09/06/2012    Conditions to be addressed/monitored: CKD III, Hypertensive nephropathy, prediabetes, NSTEMI, Essential HTN, Spinal stenosis of lumbosacral region  Care Plan : Nonspecific Low Back Pain (Adult)  Updates made by Lynne Logan, RN since 11/04/2020 12:00 AM     Problem: Chronic Low Back Pain   Priority: High     Long-Range Goal: Chronic Low Back Pain Managed   Start Date:  03/10/2020  Expected End Date: 03/18/2021  Recent Progress: On track  Priority: High  Note:   Current Barriers:  Knowledge Deficits related to managing acute/chronic pain Non-adherence to scheduled provider appointments Non-adherence to prescribed medication regimen Difficulty obtaining medications Chronic Disease Management support and education needs related to chronic pain Acute Sciatica Pain  Nurse Case Manager Clinical Goal(s):  Patient will verbalize acceptable level of pain relief and ability to engage in desired activities Interventions:  11/04/20 completed successful outbound call with patient  Collaboration with Glendale Chard, MD regarding development and update of comprehensive plan of care as evidenced by provider attestation and co-signature Inter-disciplinary care team collaboration (see longitudinal plan of care) Evaluation of current treatment plan related to chronic low back pain with sciatica involvement and patient's adherence to plan as established by provider. Determined patient completed outpatient PT as directed by Dr. Mina Marble Determined patient is following a HEP and finding there pain is more management as evidence by patient is able to maintain mobility and reports pain is less severe Encouraged patient to continue to follow her HEP as directed and to balance her activity with rest Encouraged patient to adhere to keeping all MD follow up appointments as directed and to notify Dr. Mina Marble of new or worsening symptoms  Discussed plans with patient for ongoing care management follow up and provided patient with direct contact information for care management team Patient Self Care Activities:  Self-administer medications as prescribed Attend all scheduled provider appointments Call pharmacy for medication refills Call provider office for new concerns or questions Patient Goals:  - keep all MD f/u appointments with Dr. Mina Marble for ongoing evaluation of her Sciatica pain  -  continue to follow HEP as directed   Follow Up Plan: Telephone follow up appointment with care management team member scheduled for: 03/18/21    Care Plan : Hypertension (Adult)  Updates made by Lynne Logan, RN since 11/04/2020 12:00 AM     Problem: Disease Progression (Hypertension)   Priority: High     Long-Range Goal: Disease Progression Prevented or Minimized   Start Date: 04/08/2020  Expected End Date: 04/08/2021  Recent Progress: On track  Priority: High  Note:   Objective:  Last practice recorded BP readings:  BP Readings from Last 3 Encounters:  10/05/20 118/72  10/01/20 123/71  07/25/20 (!) 175/78   Most recent eGFR/CrCl:  Lab Results  Component Value Date   EGFR 51 (L) 10/05/2020    No components found for: CRCL Current Barriers:  Knowledge Deficits related to disease process and Self Health management of HTN Chronic Disease Management support and education needs related to Essential HTN, CKDIII, Hypertensive nephropathy, prediabetes, NSTEMI  Nurse Case Manager Clinical Goal(s):  Patient will work with the CCM RN CM and PCP to address needs related to disease education and support to help improve Self Health management of HTN Interventions:  11/04/20 completed successful outbound call with patient  Collaboration with Glendale Chard, MD regarding development and update of comprehensive plan of care as evidenced by provider attestation  and co-signature Inter-disciplinary care team collaboration (see longitudinal plan of care) Provided education to patient about basic Hypertensive disease process Review of patient status, including review of consultant's reports, relevant laboratory and other test results, and medications completed. Reviewed medications with patient and discussed importance of medication adherence Reviewed and discussed Cardiology recommendations from 10/01/20 follow up with [facility]:  Kathryn [name]  is a 80 y.o. AAF with CAD s/p  PCI to LCx 2009, hypertension, statin intolerant hyperlipidemia, CKD stage 3 and mild bilateral asymptomatic carotid stenosis. She has had an episode of syncope that occurred in May 2020 suggestiv of vasovagal episodes, no recurrence.    Coronary Angiography on 01/16/18 that showed no obstructive CAD and patent stents from 2009. Patient is not on statin due to intolerance, she is enrolled in esperion trial.    Patient presents for 45-month follow-up of hypertension, hyperlipidemia, CAD.  Patient is presently taking pravastatin and Zetia for hyperlipidemia.  She is due for repeat lipid profile testing, however she has a scheduled next week with Dr. Baird Cancer, have requested that results be shared with our office.  Will not make changes to medications at this time.  Patient's blood pressure is well controlled and she remains relatively asymptomatic without clinical evidence of heart failure.  Reviewed and discussed with patient results of echocardiogram and carotid artery duplex, details above.  Carotid artery stenosis has remained stable, will continue surveillance, repeat in 6 months.  Echocardiogram is stable compared to 2022 with normal LVEF, grade 1 diastolic dysfunction, as well as moderate AR, MR and TR.     Follow-up in 6 months, sooner if needed, for hypertension, hyperlipidemia, CAD, carotid disease. Confirmed patient verbalizes understanding of her prescribed treatment plan  Reviewed and discussed next PCP OV with Dr. Baird Cancer scheduled for 03/16/21 $[email or web address]'@[id number]$ :00 AM Mailed printed educational materials related to Why Should I Lower Sodium? Discussed plans with patient for ongoing care management follow up and provided patient with direct contact information for care management team Patient Self Care Activities:  Self administer medications as prescribed Attend all scheduled provider appointments Call pharmacy for medication refills Call provider office for new concerns or questions Patient Goals:   Continue to monitor BP at home and record as discussed Continue to adhere to dietary and exercise recommendations  Follow Up Plan: Telephone follow up appointment with care management team member scheduled for: 03/18/21    Care Plan : Chronic Kidney (Adult)  Updates made by Lynne Logan, RN since 11/04/2020 12:00 AM     Problem: Disease Progression   Priority: High     Long-Range Goal: Disease Progression Prevented or Minimized   Start Date: 07/07/2020  Expected End Date: 07/07/2021  Recent Progress: On track  Priority: High  Note:   Objective:  Lab Results  Component Value Date   HGBA1C 5.8 (H) 10/05/2020   Lab Results  Component Value Date   CREATININE 1.09 (H) 10/05/2020   CREATININE 1.41 (H) 05/28/2020   CREATININE 1.28 (H) 12/30/2019   Lab Results  Component Value Date   EGFR 51 (L) 10/05/2020  Current Barriers:  Ineffective Self Health Maintenance  Clinical Goal(s):  Collaboration with Glendale Chard, MD regarding development and update of comprehensive plan of care as evidenced by provider attestation and co-signature Inter-disciplinary care team collaboration (see longitudinal plan of care) patient will work with care management team to address care coordination and chronic disease management needs related to Disease Management Educational Needs Care Coordination Medication Management and Education Psychosocial Support  Interventions:  11/04/20 completed successful outbound call with patient  Evaluation of current treatment plan related to CKD Stage III , self-management and patient's adherence to plan as established by provider. Collaboration with Glendale Chard, MD regarding development and update of comprehensive plan of care as evidenced by provider attestation       and co-signature Inter-disciplinary care team collaboration (see longitudinal plan of care) Provided education to patient about basic disease process related to CKD  Review of patient  status, including review of consultant's reports, relevant laboratory and other test results, and medications completed. Reviewed medications with patient and discussed importance of medication adherence Educated patient on importance of tracking symptoms related to CKD and increasing water intake to 64 oz daily unless otherwise directed  Mailed printed educational materials related to Eating Right with Chronic Kidney disease   Discussed plans with patient for ongoing care management follow up and provided patient with direct contact information for care management team Self Care Activities:  Self administers medications as prescribed Attends all scheduled provider appointments Calls pharmacy for medication refills Calls provider office for new concerns or questions Patient Goals: - increase water to 64 oz daily - keep BP under good control per parameters set by MD  Follow Up Plan: Telephone follow up appointment with care management team member scheduled for: 03/18/21    Plan:Telephone follow up appointment with care management team member scheduled for:  03/18/21  Barb Merino, RN, BSN, CCM Care Management Coordinator [facility]  Direct Phone: [phone]

## 2020-11-13 DIAGNOSIS — I214 Non-ST elevation (NSTEMI) myocardial infarction: Secondary | ICD-10-CM

## 2020-11-13 DIAGNOSIS — I1310 Hypertensive heart and chronic kidney disease without heart failure, with stage 1 through stage 4 chronic kidney disease, or unspecified chronic kidney disease: Secondary | ICD-10-CM

## 2020-11-13 DIAGNOSIS — M19011 Primary osteoarthritis, right shoulder: Secondary | ICD-10-CM | POA: Diagnosis not present

## 2020-11-16 ENCOUNTER — Other Ambulatory Visit: Payer: Self-pay

## 2020-11-16 ENCOUNTER — Ambulatory Visit (HOSPITAL_COMMUNITY)
Admission: RE | Admit: 2020-11-16 | Discharge: 2020-11-16 | Disposition: A | Discharge status: Home / Self Care | Payer: Medicare Other | Source: Ambulatory Visit | Attending: Internal Medicine | Admitting: Internal Medicine

## 2020-11-16 DIAGNOSIS — Z1231 Encounter for screening mammogram for malignant neoplasm of breast: Secondary | ICD-10-CM | POA: Diagnosis not present

## 2020-11-23 ENCOUNTER — Other Ambulatory Visit: Payer: Self-pay | Admitting: Cardiology

## 2020-11-23 DIAGNOSIS — I10 Essential (primary) hypertension: Secondary | ICD-10-CM

## 2020-12-01 DIAGNOSIS — H25813 Combined forms of age-related cataract, bilateral: Secondary | ICD-10-CM | POA: Diagnosis not present

## 2020-12-22 ENCOUNTER — Other Ambulatory Visit: Payer: Self-pay | Admitting: Internal Medicine

## 2020-12-23 ENCOUNTER — Other Ambulatory Visit: Payer: Self-pay | Admitting: Internal Medicine

## 2020-12-23 MED ORDER — ALPRAZOLAM 0.25 MG PO TABS: ORAL_TABLET | ORAL | 0 refills | Status: DC

## 2020-12-30 DIAGNOSIS — M19011 Primary osteoarthritis, right shoulder: Secondary | ICD-10-CM | POA: Diagnosis not present

## 2021-01-04 ENCOUNTER — Other Ambulatory Visit: Payer: Self-pay | Admitting: Internal Medicine

## 2021-01-04 DIAGNOSIS — B0229 Other postherpetic nervous system involvement: Secondary | ICD-10-CM

## 2021-01-12 ENCOUNTER — Ambulatory Visit
Admission: EM | Admit: 2021-01-12 | Discharge: 2021-01-12 | Disposition: A | Discharge status: Home / Self Care | Payer: Medicare Other | Attending: Student | Admitting: Student

## 2021-01-12 ENCOUNTER — Ambulatory Visit (INDEPENDENT_AMBULATORY_CARE_PROVIDER_SITE_OTHER): Payer: Medicare Other

## 2021-01-12 ENCOUNTER — Other Ambulatory Visit: Payer: Self-pay

## 2021-01-12 DIAGNOSIS — M79672 Pain in left foot: Secondary | ICD-10-CM

## 2021-01-12 DIAGNOSIS — R609 Edema, unspecified: Secondary | ICD-10-CM | POA: Diagnosis not present

## 2021-01-12 NOTE — Discharge Instructions (Addendum)
-  Use your boot with standing and walking until you see the orthopedist -Rest, ice, elevation, tylenol -Call your primary care doctor and ask to be referred to an orthopedist for a follow-up

## 2021-01-12 NOTE — ED Triage Notes (Signed)
Patient states she has been having problems with her left foot ( on Top of the foot)  for about a week.   She states took tylenol for the pain this morning.   Denies Fever.  Denies Injury.

## 2021-01-12 NOTE — ED Provider Notes (Addendum)
[facility]    CSN: [id number] Arrival date & time: 01/12/21  1032      History   Chief Complaint No chief complaint on file.   HPI TRISTIAN BOUSKA is a 80 y.o. female presenting with nontraumatic left foot pain for about 1 week.  Medical history chronic back pain, osteoporosis.  States it is painful and a little swollen on the top of the foot.  Denies sensation changes.  Denies trauma or overuse.  Denies history of injuries to this foot in the past.  Tylenol providing some relief.  Denies fever/chills.  HPI  Past Medical History:  Diagnosis Date   Anxiety    takes Xanax bid prn   Bruises easily    pt is on Plavix   Chronic back pain    buldging disc   Chronic neck pain    Coronary artery disease    1 stent    Dysphagia    Headache(784.0)    related to cervical issues   History of colonic polyps    Hyperlipidemia    takes Crestor daily   Hypertension    takes Coreg and Diovan daily   Osteoporosis    was taking shot 2xyr;but not taking anymore   Peripheral vascular disease (HCC)    Seasonal allergies    takes Zyrtec nightly    Patient Active Problem List   Diagnosis Date Noted   Coronary artery disease of native artery of native heart with stable angina pectoris ([place]) 06/07/2018   Anxiety 01/18/2018   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   Chronic renal disease, 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   Other abnormal glucose 01/15/2018   Neuralgia, 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   NSTEMI (non-ST elevated myocardial infarction) ([place]) 01/14/2018   Essential hypertension 01/14/2018   Hyperlipidemia 01/14/2018   Carotid stenosis, asymptomatic, bilateral 09/06/2012    Past Surgical History:  Procedure Laterality Date   ABDOMINAL HYSTERECTOMY  70's   BUNIONECTOMY     left foot   CARDIAC CATHETERIZATION  2009/2011   1 stent placed   CHOLECYSTECTOMY  2009   COLONOSCOPY     ESOPHAGOGASTRODUODENOSCOPY     LEFT HEART CATH AND CORONARY ANGIOGRAPHY N/A 01/16/2018    Procedure: LEFT HEART CATH AND CORONARY ANGIOGRAPHY;  Surgeon: Nigel Mormon, MD;  Location: [facility];  Service: Cardiovascular;  Laterality: N/A;   TONSILLECTOMY     at age 70    OB History   No obstetric history on file.      Home Medications    Prior to Admission medications   Medication Sig Start Date End Date Taking? Authorizing Provider  acetaminophen (TYLENOL) 325 MG tablet Take 325 mg by mouth every 6 (six) hours as needed for mild pain.    [provider]  ALPRAZolam (XANAX) 0.25 MG tablet TAKE (1) TABLET BY MOUTH TWICE DAILY AS NEEDED FOR ANXIETY. 12/23/20   Glendale Chard, MD  amLODipine (NORVASC) 10 MG tablet TAKE ONE TABLET BY MOUTH ONCE DAILY. 11/24/20   Adrian Prows, MD  Apoaequorin (PREVAGEN PO) Take 1 tablet by mouth daily.    [provider]  Aspirin 81 MG CAPS     [provider]  CALCIUM PO Take 1 tablet by mouth daily. 600 mg    [provider]  carvedilol (COREG) 12.5 MG tablet TAKE (1) TABLET BY MOUTH TWICE DAILY. 10/12/20   C[place]well, Celeste C, PA-C  cetirizine (ZYRTEC) 10 MG tablet Take 10 mg by mouth at bedtime.    [provider]  ezetimibe (ZETIA) 10 MG tablet Take 1 tablet (10 mg total) by mouth daily. 04/20/20 04/20/21  C[place]well, Celeste C, PA-C  ferrous sulfate 324 MG TBEC Take 324 mg by mouth every other day.    [provider]  furosemide (LASIX) 20 MG tablet Take 1 tablet (20 mg total) by mouth daily as needed for fluid. 03/18/20   C[place]well, Celeste C, PA-C  levocetirizine (XYZAL) 5 MG tablet Take 5 mg by mouth every evening.    [provider]  Magnesium 250 MG TABS Take 1 tablet by mouth daily.    [provider]  meloxicam (MOBIC) 7.5 MG tablet Take 7.5 mg by mouth daily. 05/20/20   [provider]  Multiple Vitamin (MULTIVITAMIN WITH MINERALS) TABS Take 1 tablet by mouth daily.    [provider]  nitroGLYCERIN (NITROSTAT) 0.4 MG SL tablet Place 1 tablet  (0.4 mg total) under the tongue every 5 (five) minutes x 3 doses as needed for chest pain. 03/18/20   C[place]well, Celeste C, PA-C  pravastatin (PRAVACHOL) 40 MG tablet TAKE 1 TABLET WEEKLY. (SAME DAY EACH WEEK) 02/21/20   Glendale Chard, MD  pregabalin (LYRICA) 75 MG capsule TAKE 1 CAPSULE BY MOUTH THREE TIMES A DAY. 01/05/21   Glendale Chard, MD  triamcinolone cream (KENALOG) 0.1 % Apply 1 application topically 2 (two) times daily. 07/25/20   Faustino Congress, NP  valACYclovir (VALTREX) 1000 MG tablet Take 1 tablet (1,000 mg total) by mouth 3 (three) times daily. 01/04/20   AvegnoDarrelyn Hillock, FNP    Family History Family History  Problem Relation Age of Onset   Heart disease Mother        Heart Dissease before age 6   Heart attack Mother    Cancer Father    Cancer Sister    Leukemia Sister    Cancer Brother    Heart disease Brother        Amputation   Heart attack Brother    Heart disease Brother    Heart attack Brother    Dementia Other    Anesthesia problems Neg Hx    Hypotension Neg Hx    Malignant hyperthermia Neg Hx    Pseudochol deficiency Neg Hx     Social History Social History   Tobacco Use   Smoking status: Former    Packs/day: 0.25    Years: 5.00    Pack years: 1.25    Types: Cigarettes    Quit date: 11/27/1968    Years since quitting: 52.1   Smokeless tobacco: Never  Vaping Use   Vaping Use: Never used  Substance Use Topics   Alcohol use: No   Drug use: No     Allergies   Codeine and Shellfish allergy   Review of Systems Review of Systems  Musculoskeletal:        L foot pain  All other systems reviewed and are negative.   Physical Exam Triage Vital Signs ED Triage Vitals  Enc Vitals Group     BP 01/12/21 1418 119/75     Pulse Rate 01/12/21 1418 66     Resp 01/12/21 1418 18     Temp 01/12/21 1418 97.7 F (36.5 C)     Temp Source 01/12/21 1418 Oral     SpO2 01/12/21 1418 96 %     Weight --      Height --      Head Circumference --       Peak Flow --  Pain Score 01/12/21 1415 8     Pain Loc --      Pain Edu? --      Excl. in [place]? --    No data found.  Updated Vital Signs BP 119/75 (BP Location: Left Arm)   Pulse 66   Temp 97.7 F (36.5 C) (Oral)   Resp 18   SpO2 96%   Visual Acuity Right Eye Distance:   Left Eye Distance:   Bilateral Distance:    Right Eye Near:   Left Eye Near:    Bilateral Near:     Physical Exam Vitals reviewed.  Constitutional:      General: She is not in acute distress.    Appearance: Normal appearance. She is not ill-appearing.  HENT:     Head: Normocephalic and atraumatic.  Pulmonary:     Effort: Pulmonary effort is normal.  Musculoskeletal:     Comments: L foot- TTP dorsum with mild effusion. No point tenderness. No malleolar tenderness. Minimal midfoot tenderness. DP 2+, cap refill <2 seconds, sensation intact.  Neurological:     General: No focal deficit present.     Mental Status: She is alert and oriented to person, place, and time.  Psychiatric:        Mood and Affect: Mood normal.        Behavior: Behavior normal.        Thought Content: Thought content normal.        Judgment: Judgment normal.     UC Treatments / Results  Labs (all labs ordered are listed, but only abnormal results are displayed) Labs Reviewed - No data to display  EKG   Radiology DG Foot Complete Left  Result Date: 01/12/2021 CLINICAL DATA:  Nontraumatic pain and effusion to dorsum L foot EXAM: LEFT FOOT - COMPLETE 3+ VIEW COMPARISON:  None. FINDINGS: K-wires in the distal first metatarsal potentially from a remote prior osteotomy. Arthropathy at the Lisfranc joint with poor definition of the articulation between the middle cuneiform and the base of the second metatarsal and the lateral cuneiform and base of the third metatarsal. However I do not see malalignment at the Lisfranc joint. The ill definition is probably due to degenerative arthropathy given the subcortical sclerosis and  substantial spurring visible on the lateral projection. Plantar and Achilles calcaneal spurs. Interphalangeal articular space narrowing in the second through fifth toes favoring osteoarthritis. No gas in the soft tissues. No acute fracture. Minimal dorsal subcutaneous edema along the forefoot. IMPRESSION: 1. Minimal dorsal subcutaneous edema along the forefoot. 2. Postoperative findings in the distal first metatarsal. 3. 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 4. Plantar and Achilles calcaneal spurs. Electronically Signed   By: Van Clines M.D.   On: 01/12/2021 15:21    Procedures Procedures (including critical care time)  Medications Ordered in UC Medications - No data to display  Initial Impression / Assessment and Plan / UC Course  I have reviewed the triage vital signs and the nursing notes.  Pertinent labs & imaging results that were available during my care of the patient were reviewed by me and considered in my medical decision making (see chart for details).     This patient is a very pleasant 80 y.o. year old female presenting with L foot pain. Neurovascularly intact.  Xray L foot -  1. Minimal dorsal subcutaneous edema along the forefoot. 2. Postoperative findings in the distal first metatarsal. 3. 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 4.  Plantar and Achilles calcaneal spurs.  Suspect past untreated Lisfranc injury.  Placed in CAM boot and rec close f/u with ortho.  ED return precautions discussed. Patient verbalizes understanding and agreement.   Addendum: this patient was ambulatory prior to her injury.  Final Clinical Impressions(s) / UC Diagnoses   Final diagnoses:  Pain of left midfoot     Discharge Instructions      -Use your boot with standing and walking until you see the orthopedist -Rest, ice, elevation, tylenol -Call your primary care doctor and ask to be referred to an  orthopedist for a follow-up     ED Prescriptions   None    PDMP not reviewed this encounter.   Hazel Sams, PA-C 01/12/21 1546    Hazel Sams, PA-C 01/29/21 1223

## 2021-01-14 ENCOUNTER — Other Ambulatory Visit: Payer: Self-pay

## 2021-01-14 ENCOUNTER — Encounter: Payer: Self-pay | Admitting: Nurse Practitioner

## 2021-01-14 ENCOUNTER — Ambulatory Visit (INDEPENDENT_AMBULATORY_CARE_PROVIDER_SITE_OTHER): Payer: Medicare Other | Admitting: Nurse Practitioner

## 2021-01-14 VITALS — BP 136/88 | HR 91 | Temp 98.6°F | Ht 62.2 in

## 2021-01-14 DIAGNOSIS — M79672 Pain in left foot: Secondary | ICD-10-CM

## 2021-01-14 NOTE — Progress Notes (Signed)
I,[name],acting as a Education administrator for [employer], FNP.,have documented all relevant documentation on the behalf of Minette Brine, FNP,as directed by  Minette Brine, FNP while in the presence of Minette Brine, [place].  This visit occurred during the SARS-CoV-2 public health emergency.  Safety protocols were in place, including screening questions prior to the visit, additional usage of staff PPE, and extensive cleaning of exam room while observing appropriate contact time as indicated for disinfecting solutions.  Subjective:     Patient ID: Kathryn [name] , female    DOB: [birth year] , 80 y.o.   MRN: [id number]   Chief Complaint  Patient presents with   Foot Pain    Left foot    HPI  The patient is here today for a referral to a podiatrist. She went to Urgent care on Nov 29th, she was found  She has a walking boot and she is afraid to walk in it by herself. Using her rollator walker According to her notes her xray left foot reveals.  Xray L foot -  1. Minimal dorsal subcutaneous edema along the forefoot. 2. Postoperative findings in the distal first metatarsal. 3. 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 4. Plantar and Achilles calcaneal spurs.   Suspect past untreated Lisfranc injury.  Placed in CAM boot and rec close f/u with ortho.  Foot Pain This is a new problem. The current episode started more than 1 month ago. The problem occurs constantly. The problem has been gradually worsening. Associated symptoms comments: She was having problems walking on her foot . Treatments tried: Wearing a walking boot.    Past Medical History:  Diagnosis Date   Anxiety    takes Xanax bid prn   Bruises easily    pt is on Plavix   Chronic back pain    buldging disc   Chronic neck pain    Coronary artery disease    1 stent    Dysphagia    Headache(784.0)    related to cervical issues   History of colonic polyps    Hyperlipidemia    takes Crestor  daily   Hypertension    takes Coreg and Diovan daily   Osteoporosis    was taking shot 2xyr;but not taking anymore   Peripheral vascular disease (HCC)    Seasonal allergies    takes Zyrtec nightly     Family History  Problem Relation Age of Onset   Heart disease Mother        Heart Dissease before age 76   Heart attack Mother    Cancer Father    Cancer Sister    Leukemia Sister    Cancer Brother    Heart disease Brother        Amputation   Heart attack Brother    Heart disease Brother    Heart attack Brother    Dementia Other    Anesthesia problems Neg Hx    Hypotension Neg Hx    Malignant hyperthermia Neg Hx    Pseudochol deficiency Neg Hx      Current Outpatient Medications:    acetaminophen (TYLENOL) 325 MG tablet, Take 325 mg by mouth every 6 (six) hours as needed for mild pain., Disp: , Rfl:    ALPRAZolam (XANAX) 0.25 MG tablet, TAKE (1) TABLET BY MOUTH TWICE DAILY AS NEEDED FOR ANXIETY., Disp: 30 tablet, Rfl: 0   amLODipine (NORVASC) 10 MG tablet, TAKE ONE TABLET BY MOUTH ONCE DAILY., Disp: 90 tablet, Rfl: 0  Apoaequorin (PREVAGEN PO), Take 1 tablet by mouth daily., Disp: , Rfl:    Aspirin 81 MG CAPS, , Disp: , Rfl:    CALCIUM PO, Take 1 tablet by mouth daily. 600 mg, Disp: , Rfl:    carvedilol (COREG) 12.5 MG tablet, TAKE (1) TABLET BY MOUTH TWICE DAILY., Disp: 180 tablet, Rfl: 0   cetirizine (ZYRTEC) 10 MG tablet, Take 10 mg by mouth at bedtime., Disp: , Rfl:    ezetimibe (ZETIA) 10 MG tablet, Take 1 tablet (10 mg total) by mouth daily., Disp: 30 tablet, Rfl: 11   ferrous sulfate 324 MG TBEC, Take 324 mg by mouth every other day., Disp: , Rfl:    furosemide (LASIX) 20 MG tablet, Take 1 tablet (20 mg total) by mouth daily as needed for fluid., Disp: 90 tablet, Rfl: 0   levocetirizine (XYZAL) 5 MG tablet, Take 5 mg by mouth every evening., Disp: , Rfl:    Magnesium 250 MG TABS, Take 1 tablet by mouth daily., Disp: , Rfl:    meloxicam (MOBIC) 7.5 MG tablet, Take  7.5 mg by mouth daily., Disp: , Rfl:    Multiple Vitamin (MULTIVITAMIN WITH MINERALS) TABS, Take 1 tablet by mouth daily., Disp: , Rfl:    nitroGLYCERIN (NITROSTAT) 0.4 MG SL tablet, Place 1 tablet (0.4 mg total) under the tongue every 5 (five) minutes x 3 doses as needed for chest pain., Disp: 30 tablet, Rfl: 3   pravastatin (PRAVACHOL) 40 MG tablet, TAKE 1 TABLET WEEKLY. (SAME DAY EACH WEEK), Disp: 13 tablet, Rfl: 1   pregabalin (LYRICA) 75 MG capsule, TAKE 1 CAPSULE BY MOUTH THREE TIMES A DAY., Disp: 90 capsule, Rfl: 3   triamcinolone cream (KENALOG) 0.1 %, Apply 1 application topically 2 (two) times daily., Disp: 30 g, Rfl: 0   valACYclovir (VALTREX) 1000 MG tablet, Take 1 tablet (1,000 mg total) by mouth 3 (three) times daily., Disp: 21 tablet, Rfl: 0   Allergies  Allergen Reactions   Codeine Hypertension   Shellfish Allergy      Review of Systems  Constitutional: Negative.   Respiratory: Negative.    Cardiovascular: Negative.   Gastrointestinal: Negative.   Psychiatric/Behavioral: Negative.    All other systems reviewed and are negative.   Today's Vitals   01/14/21 1011  BP: 136/88  Pulse: 91  Temp: 98.6 F (37 C)  Height: 5' 2.2" (1.58 m)  PainSc: 5   PainLoc: Foot   Body mass index is 39.83 kg/m.  Wt Readings from Last 3 Encounters:  10/05/20 219 lb 3.2 oz (99.4 kg)  10/01/20 217 lb 9.6 oz (98.7 kg)  05/28/20 222 lb 9.6 oz (101 kg)    BP Readings from Last 3 Encounters:  01/14/21 136/88  01/12/21 119/75  10/05/20 118/72    Objective:  Physical Exam Vitals reviewed.  Constitutional:      General: She is not in acute distress.    Appearance: Normal appearance. She is obese.  Musculoskeletal:     Comments: Using rollator walker, has walking boot on as well.   Neurological:     General: No focal deficit present.     Mental Status: She is alert and oriented to person, place, and time.     Cranial Nerves: No cranial nerve deficit.     Motor: No weakness.   Psychiatric:        Mood and Affect: Mood normal.        Behavior: Behavior normal.        Thought Content:  Thought content normal.        Judgment: Judgment normal.        Assessment And Plan:     1. Left foot pain Her Xray at the urgent care revealed a possible fracture to the dorsal surface of her foot. Will refer to Dr. Landis Martins - Ambulatory referral to Podiatry    Patient was given opportunity to ask questions. Patient verbalized understanding of the plan and was able to repeat key elements of the plan. All questions were answered to their satisfaction.  Minette Brine, FNP   I, Minette Brine, FNP, have reviewed all documentation for this visit. The documentation on 01/14/21 for the exam, diagnosis, procedures, and orders are all accurate and complete.   IF YOU HAVE BEEN REFERRED TO A SPECIALIST, IT MAY TAKE 1-2 WEEKS TO SCHEDULE/PROCESS THE REFERRAL. IF YOU HAVE NOT HEARD FROM US/SPECIALIST IN TWO WEEKS, PLEASE GIVE [place] A CALL AT [phone].   THE PATIENT IS ENCOURAGED TO PRACTICE SOCIAL DISTANCING DUE TO THE COVID-19 PANDEMIC.

## 2021-01-14 NOTE — Patient Instructions (Signed)
Dr. Landis Martins Address: [address] [place], [place], [place] [address] Phone: [phone]

## 2021-01-16 ENCOUNTER — Other Ambulatory Visit: Payer: Self-pay | Admitting: Student

## 2021-01-16 DIAGNOSIS — I25118 Atherosclerotic heart disease of native coronary artery with other forms of angina pectoris: Secondary | ICD-10-CM

## 2021-01-28 ENCOUNTER — Encounter: Payer: Self-pay | Admitting: Sports Medicine

## 2021-01-28 ENCOUNTER — Ambulatory Visit: Payer: Medicare Other | Admitting: Sports Medicine

## 2021-01-28 ENCOUNTER — Ambulatory Visit: Payer: Medicare Other

## 2021-01-28 ENCOUNTER — Other Ambulatory Visit: Payer: Self-pay

## 2021-01-28 DIAGNOSIS — S93602A Unspecified sprain of left foot, initial encounter: Secondary | ICD-10-CM | POA: Diagnosis not present

## 2021-01-28 DIAGNOSIS — M19072 Primary osteoarthritis, left ankle and foot: Secondary | ICD-10-CM

## 2021-01-28 DIAGNOSIS — M79672 Pain in left foot: Secondary | ICD-10-CM | POA: Diagnosis not present

## 2021-01-28 MED ORDER — DICLOFENAC SODIUM 1 % EX GEL: 4.0000 g | Freq: Four times a day (QID) | CUTANEOUS | 1 refills | Status: AC

## 2021-01-28 NOTE — Patient Instructions (Signed)
Topical voltaren gel 1%, may purchase OTC to use as needed for pain or discomfort at [facility]

## 2021-01-28 NOTE — Progress Notes (Signed)
Subjective: Kathryn [name] is a 80 y.o. female patient who presents to office for evaluation of left foot pain.  Patient reports that there is pain when she walks it started about 2 to 3 weeks ago states that she has been seen at urgent care and they said that it may be an old fracture and she has also been put in a walking boot states that the boot helps but the area is swollen across the top of the foot and hurts on the bottom when she goes to take a step when she does not wear the boot.  Patient denies any known injury.  No other pedal complaints noted.  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     ALPRAZolam (XANAX) 0.25 MG tablet TAKE (1) TABLET BY MOUTH TWICE DAILY AS NEEDED FOR ANXIETY. 30 tablet 0   amLODipine (NORVASC) 10 MG tablet TAKE ONE TABLET BY MOUTH ONCE DAILY. 90 tablet 0   Apoaequorin (PREVAGEN PO) Take 1 tablet by mouth daily.     Aspirin 81 MG CAPS      CALCIUM PO Take 1 tablet by mouth daily. 600 mg     carvedilol (COREG) 12.5 MG tablet TAKE (1) TABLET BY MOUTH TWICE DAILY. 180 tablet 0   cetirizine (ZYRTEC) 10 MG tablet Take 10 mg by mouth at bedtime.     ezetimibe (ZETIA) 10 MG tablet Take 1 tablet (10 mg total) by mouth daily. 30 tablet 11   ferrous sulfate 324 MG TBEC Take 324 mg by mouth every other  day.     furosemide (LASIX) 20 MG tablet Take 1 tablet (20 mg total) by mouth daily as needed for fluid. 90 tablet 0   levocetirizine (XYZAL) 5 MG tablet Take 5 mg by mouth every evening.     Magnesium 250 MG TABS Take 1 tablet by mouth daily.     meloxicam (MOBIC) 7.5 MG tablet Take 7.5 mg by mouth daily.     Multiple Vitamin (MULTIVITAMIN WITH MINERALS) TABS Take 1 tablet by mouth daily.     nitroGLYCERIN (NITROSTAT) 0.4 MG SL tablet Place 1 tablet (0.4 mg total) under the tongue every 5 (five) minutes x 3 doses as needed for chest pain. 30 tablet 3   pravastatin (PRAVACHOL) 40 MG tablet TAKE 1 TABLET WEEKLY. (SAME DAY EACH WEEK) 13 tablet 1   pregabalin (LYRICA) 75 MG capsule TAKE 1 CAPSULE BY MOUTH THREE TIMES A DAY. 90 capsule 3   triamcinolone cream (KENALOG) 0.1 % Apply 1 application topically 2 (two) times daily. 30 g 0   valACYclovir (VALTREX) 1000 MG tablet Take 1 tablet (1,000 mg total) by mouth 3 (three) times daily. 21 tablet 0   No current facility-administered medications on file prior to visit.    Allergies  Allergen Reactions   Codeine Hypertension   Shellfish Allergy  Objective:  General: Alert and oriented x3 in no acute distress  Dermatology: No open lesions bilateral lower extremities, no webspace macerations, no ecchymosis bilateral, all nails x 10 are well manicured.  Vascular: Dorsalis Pedis and Posterior Tibial pedal pulses palpable, Capillary Fill Time 3 seconds,(+) pedal hair growth bilateral, no edema bilateral lower extremities, Temperature gradient within normal limits.  Neurology: Gross sensation intact via light touch bilateral.   Musculoskeletal: Mild tenderness with palpation at dorsal and plantar midfoot on the left strength within normal limits in all groups bilateral.   X-rays reviewed in chart  Assessment and Plan: Problem List Items Addressed This Visit   None Visit Diagnoses     Left foot pain    -  Primary   Relevant Orders   MR  FOOT LEFT WO CONTRAST   Arthritis of midtarsal joint of left foot       Foot sprain, left, initial encounter       Relevant Orders   MR FOOT LEFT WO CONTRAST       -Complete examination performed -Xrays reviewed -Discussed treatement options for possible low-grade sprain Lisfranc injury versus arthritis of the midfoot which could have flared up or Non-Diabetic Charcot -Rx MRI for further evaluation of left foot -Advised patient to continue with cam boot and also dispensed a surgical shoe just in case the patient cannot tolerate wearing the cam boot she may transition to this shoe -Rx topical Voltaren to use as needed for pain -Patient to return to office after MRI or sooner if condition worsens.  Landis Martins, DPM

## 2021-02-08 ENCOUNTER — Other Ambulatory Visit: Payer: Self-pay | Admitting: Cardiology

## 2021-02-08 DIAGNOSIS — I10 Essential (primary) hypertension: Secondary | ICD-10-CM

## 2021-02-08 DIAGNOSIS — I25118 Atherosclerotic heart disease of native coronary artery with other forms of angina pectoris: Secondary | ICD-10-CM

## 2021-02-09 ENCOUNTER — Telehealth: Payer: Self-pay | Admitting: Sports Medicine

## 2021-02-09 NOTE — Telephone Encounter (Signed)
Pt called stating Imaging hasn't reached out to her yet. She is requesting you to give her a call so she can speak to you directly. Please advise.

## 2021-02-11 ENCOUNTER — Telehealth: Payer: Self-pay | Admitting: *Deleted

## 2021-02-11 DIAGNOSIS — H01004 Unspecified blepharitis left upper eyelid: Secondary | ICD-10-CM | POA: Diagnosis not present

## 2021-02-11 DIAGNOSIS — H40013 Open angle with borderline findings, low risk, bilateral: Secondary | ICD-10-CM | POA: Diagnosis not present

## 2021-02-11 DIAGNOSIS — H01001 Unspecified blepharitis right upper eyelid: Secondary | ICD-10-CM | POA: Diagnosis not present

## 2021-02-11 DIAGNOSIS — H01002 Unspecified blepharitis right lower eyelid: Secondary | ICD-10-CM | POA: Diagnosis not present

## 2021-02-11 DIAGNOSIS — H25813 Combined forms of age-related cataract, bilateral: Secondary | ICD-10-CM | POA: Diagnosis not present

## 2021-02-11 NOTE — Telephone Encounter (Signed)
Called and spoke Kathryn [name] at [facility] imaging and patient is on the list to be called and get scheduled for MRI. Lattie Haw

## 2021-02-11 NOTE — Telephone Encounter (Signed)
Tried to call the patient to let her know that [facility] imaging will be calling to schedule the MRI but got a busy signal. Kathryn [name]

## 2021-02-16 ENCOUNTER — Other Ambulatory Visit: Payer: Self-pay | Admitting: Cardiology

## 2021-02-16 DIAGNOSIS — E7800 Pure hypercholesterolemia, unspecified: Secondary | ICD-10-CM | POA: Diagnosis not present

## 2021-02-16 DIAGNOSIS — R0600 Dyspnea, unspecified: Secondary | ICD-10-CM | POA: Diagnosis not present

## 2021-02-16 DIAGNOSIS — I10 Essential (primary) hypertension: Secondary | ICD-10-CM

## 2021-02-17 LAB — LIPID PANEL WITH LDL/HDL RATIO
Cholesterol, Total: 206 mg/dL — ABNORMAL HIGH (ref 100–199)
HDL: 65 mg/dL (ref >39)
LDL Chol Calc (NIH): 126 mg/dL — ABNORMAL HIGH (ref 0–99)
LDL/HDL Ratio: 1.9 ratio (ref 0.0–3.2)
Triglycerides: 86 mg/dL (ref 0–149)
VLDL Cholesterol Cal: 15 mg/dL (ref 5–40)

## 2021-02-17 LAB — BRAIN NATRIURETIC PEPTIDE: BNP: 63.1 pg/mL (ref 0.0–100.0)

## 2021-02-27 ENCOUNTER — Ambulatory Visit
Admission: RE | Admit: 2021-02-27 | Discharge: 2021-02-27 | Disposition: A | Discharge status: Home / Self Care | Payer: Medicare Other | Source: Ambulatory Visit | Attending: Sports Medicine | Admitting: Sports Medicine

## 2021-02-27 ENCOUNTER — Other Ambulatory Visit: Payer: Self-pay

## 2021-02-27 DIAGNOSIS — M25775 Osteophyte, left foot: Secondary | ICD-10-CM | POA: Diagnosis not present

## 2021-02-27 DIAGNOSIS — Z9889 Other specified postprocedural states: Secondary | ICD-10-CM | POA: Diagnosis not present

## 2021-02-27 DIAGNOSIS — R600 Localized edema: Secondary | ICD-10-CM | POA: Diagnosis not present

## 2021-02-27 DIAGNOSIS — M79672 Pain in left foot: Secondary | ICD-10-CM

## 2021-02-27 DIAGNOSIS — S93602A Unspecified sprain of left foot, initial encounter: Secondary | ICD-10-CM

## 2021-03-02 ENCOUNTER — Ambulatory Visit (INDEPENDENT_AMBULATORY_CARE_PROVIDER_SITE_OTHER): Payer: Medicare Other | Admitting: Sports Medicine

## 2021-03-02 DIAGNOSIS — M19072 Primary osteoarthritis, left ankle and foot: Secondary | ICD-10-CM

## 2021-03-02 DIAGNOSIS — M79672 Pain in left foot: Secondary | ICD-10-CM

## 2021-03-02 NOTE — Progress Notes (Signed)
Virtual Visit via Telephone Note  I connected with Kathryn [name] on 03/02/21 at  8:00 AM EST by telephone and verified that I am speaking with the correct person using two identifiers.  Location: Patient: Kathryn [name] home Provider: Landis Martins, DPM [facility] [place] office    I discussed the limitations, risks, security and privacy concerns of performing an evaluation and management service by telephone and the availability of in person appointments. I also discussed with the patient that there may be a patient responsible charge related to this service. The patient expressed understanding and agreed to proceed.   History of Present Illness: 81 year old female patient met via telephone visit to discuss MRI results of her left foot.  Patient reports that she is doing okay this morning.  Denies any other concerns at this time.   Observations/Objective: Physical exam unable to be performed due to telephone nature of visit  Assessment and Plan: Problem List Items Addressed This Visit   None Visit Diagnoses     Arthritis of midtarsal joint of left foot    -  Primary   Left foot pain          MRI results reviewed consistent with arthritis of the midfoot  Advised patient since she has arthritis to continue with topical Voltaren, good supportive shoes, and over-the-counter arch supports.  Advised patient if symptoms worsen may return to office for steroid injection however at this time since symptoms are doing better we will continue with conservative care and use of topical as above.  Follow Up Instructions: PRN   I discussed the assessment and treatment plan with the patient. The patient was provided an opportunity to ask questions and all were answered. The patient agreed with the plan and demonstrated an understanding of the instructions.   The patient was advised to call back or seek an in-person evaluation if the symptoms worsen or if the condition  fails to improve as anticipated.  I provided 5 minutes of non-face-to-face time during this encounter.   Landis Martins, DPM

## 2021-03-16 ENCOUNTER — Encounter: Payer: Self-pay | Admitting: Internal Medicine

## 2021-03-16 ENCOUNTER — Ambulatory Visit (INDEPENDENT_AMBULATORY_CARE_PROVIDER_SITE_OTHER): Payer: Medicare Other | Admitting: Internal Medicine

## 2021-03-16 ENCOUNTER — Other Ambulatory Visit: Payer: Self-pay

## 2021-03-16 VITALS — BP 138/70 | HR 78 | Temp 98.0°F | Ht 62.0 in | Wt 218.2 lb

## 2021-03-16 DIAGNOSIS — M5431 Sciatica, right side: Secondary | ICD-10-CM

## 2021-03-16 DIAGNOSIS — Z6839 Body mass index (BMI) 39.0-39.9, adult: Secondary | ICD-10-CM

## 2021-03-16 DIAGNOSIS — R7303 Prediabetes: Secondary | ICD-10-CM

## 2021-03-16 DIAGNOSIS — E7800 Pure hypercholesterolemia, unspecified: Secondary | ICD-10-CM | POA: Diagnosis not present

## 2021-03-16 DIAGNOSIS — N1832 Chronic kidney disease, stage 3b: Secondary | ICD-10-CM

## 2021-03-16 DIAGNOSIS — Z0000 Encounter for general adult medical examination without abnormal findings: Secondary | ICD-10-CM | POA: Diagnosis not present

## 2021-03-16 DIAGNOSIS — I1310 Hypertensive heart and chronic kidney disease without heart failure, with stage 1 through stage 4 chronic kidney disease, or unspecified chronic kidney disease: Secondary | ICD-10-CM

## 2021-03-16 DIAGNOSIS — I25118 Atherosclerotic heart disease of native coronary artery with other forms of angina pectoris: Secondary | ICD-10-CM

## 2021-03-16 LAB — POCT URINALYSIS DIPSTICK
Bilirubin, UA: NEGATIVE
Blood, UA: NEGATIVE
Glucose, UA: NEGATIVE
Ketones, UA: NEGATIVE
Leukocytes, UA: NEGATIVE
Nitrite, UA: NEGATIVE
Protein, UA: POSITIVE — AB
Spec Grav, UA: 1.015 (ref 1.010–1.025)
Urobilinogen, UA: 0.2 E.U./dL
pH, UA: 5.5 (ref 5.0–8.0)

## 2021-03-16 NOTE — Progress Notes (Signed)
Rich Brave [place],acting as a Education administrator for [name], MD.,have documented all relevant documentation on the behalf of [name], MD,as directed by  [name], MD while in the presence of [name], MD.  This visit occurred during the SARS-CoV-2 public health emergency.  Safety protocols were in place, including screening questions prior to the visit, additional usage of staff PPE, and extensive cleaning of exam room while observing appropriate contact time as indicated for disinfecting solutions.  Subjective:     Patient ID: Kathryn [name] , female    DOB: [birth year] , 81 y.o.   MRN: [id number]   Chief Complaint  Patient presents with   Annual Exam    HPI  Patient is here today for her physical exam. She is here today for a full physical examination. She is no longer followed by Gyn. She reports compliance with meds. She denies headaches, chest pain and shortness of breath.   Hypertension This is a chronic problem. The current episode started more than 1 year ago. The problem has been gradually improving since onset. The problem is uncontrolled. Pertinent negatives include no blurred vision, chest pain, headaches, palpitations or shortness of breath. Risk factors for coronary artery disease include dyslipidemia, obesity, post-menopausal state and sedentary lifestyle. The current treatment provides moderate improvement. Compliance problems include exercise.  Hypertensive end-organ damage includes kidney disease.    Past Medical History:  Diagnosis Date   Anxiety    takes Xanax bid prn   Bruises easily    pt is on Plavix   Chronic back pain    buldging disc   Chronic neck pain    Coronary artery disease    1 stent    Dysphagia    Headache(784.0)    related to cervical issues   History of colonic polyps    Hyperlipidemia    takes Crestor daily   Hypertension    takes Coreg and Diovan daily   Osteoporosis    was taking shot 2xyr;but not taking anymore    Peripheral vascular disease (HCC)    Seasonal allergies    takes Zyrtec nightly     Family History  Problem Relation Age of Onset   Heart disease Mother        Heart Dissease before age 8   Heart attack Mother    Cancer Father    Cancer Sister    Leukemia Sister    Cancer Brother    Heart disease Brother        Amputation   Heart attack Brother    Heart disease Brother    Heart attack Brother    Dementia Other    Anesthesia problems Neg Hx    Hypotension Neg Hx    Malignant hyperthermia Neg Hx    Pseudochol deficiency Neg Hx      Current Outpatient Medications:    acetaminophen (TYLENOL) 325 MG tablet, Take 325 mg by mouth every 6 (six) hours as needed for mild pain., Disp: , Rfl:    ALPRAZolam (XANAX) 0.25 MG tablet, TAKE (1) TABLET BY MOUTH TWICE DAILY AS NEEDED FOR ANXIETY., Disp: 30 tablet, Rfl: 0   amLODipine (NORVASC) 10 MG tablet, TAKE ONE TABLET BY MOUTH ONCE DAILY., Disp: 90 tablet, Rfl: 0   Apoaequorin (PREVAGEN PO), Take 1 tablet by mouth daily., Disp: , Rfl:    Aspirin 81 MG CAPS, , Disp: , Rfl:    CALCIUM PO, Take 1 tablet by mouth daily. 600 mg, Disp: , Rfl:  carvedilol (COREG) 12.5 MG tablet, TAKE (1) TABLET BY MOUTH TWICE DAILY., Disp: 180 tablet, Rfl: 0   cetirizine (ZYRTEC) 10 MG tablet, Take 10 mg by mouth at bedtime., Disp: , Rfl:    diclofenac Sodium (VOLTAREN) 1 % GEL, Apply 4 g topically 4 (four) times daily., Disp: 150 g, Rfl: 1   ezetimibe (ZETIA) 10 MG tablet, Take 1 tablet (10 mg total) by mouth daily., Disp: 30 tablet, Rfl: 11   furosemide (LASIX) 20 MG tablet, TAKE 1 TABLET BY MOUTH DAILY, Disp: 90 tablet, Rfl: 0   gabapentin (NEURONTIN) 300 MG capsule, Take 300 mg by mouth at bedtime., Disp: , Rfl:    Magnesium 250 MG TABS, Take 1 tablet by mouth daily., Disp: , Rfl:    Multiple Vitamin (MULTIVITAMIN WITH MINERALS) TABS, Take 1 tablet by mouth daily., Disp: , Rfl:    nitroGLYCERIN (NITROSTAT) 0.4 MG SL tablet, Place 1 tablet (0.4 mg total)  under the tongue every 5 (five) minutes x 3 doses as needed for chest pain., Disp: 30 tablet, Rfl: 3   pregabalin (LYRICA) 75 MG capsule, TAKE 1 CAPSULE BY MOUTH THREE TIMES A DAY., Disp: 90 capsule, Rfl: 3   triamcinolone cream (KENALOG) 0.1 %, Apply 1 application topically 2 (two) times daily., Disp: 30 g, Rfl: 0   valACYclovir (VALTREX) 1000 MG tablet, Take 1 tablet (1,000 mg total) by mouth 3 (three) times daily., Disp: 21 tablet, Rfl: 0   Allergies  Allergen Reactions   Codeine Hypertension   Shellfish Allergy       The patient states she uses post menopausal status for birth control. Last LMP was No LMP recorded. Patient has had a hysterectomy.. Negative for Dysmenorrhea. Negative for: breast discharge, breast lump(s), breast pain and breast self exam. Associated symptoms include abnormal vaginal bleeding. Pertinent negatives include abnormal bleeding (hematology), anxiety, decreased libido, depression, difficulty falling sleep, dyspareunia, history of infertility, nocturia, sexual dysfunction, sleep disturbances, urinary incontinence, urinary urgency, vaginal discharge and vaginal itching. Diet regular.The patient states her exercise level is  minimal.   . The patient's tobacco use is:  Social History   Tobacco Use  Smoking Status Former   Packs/day: 0.25   Years: 5.00   Pack years: 1.25   Types: Cigarettes   Quit date: 11/27/1968   Years since quitting: 52.3  Smokeless Tobacco Never  . She has been exposed to passive smoke. The patient's alcohol use is:  Social History   Substance and Sexual Activity  Alcohol Use No   Review of Systems  Constitutional: Negative.   HENT: Negative.    Eyes: Negative.  Negative for blurred vision.  Respiratory: Negative.  Negative for shortness of breath.   Cardiovascular: Negative.  Negative for chest pain and palpitations.  Gastrointestinal: Negative.   Endocrine: Negative.   Genitourinary: Negative.   Musculoskeletal: Negative.    Skin: Negative.   Allergic/Immunologic: Negative.   Neurological: Negative.  Negative for headaches.  Hematological: Negative.   Psychiatric/Behavioral: Negative.      Today's Vitals   03/16/21 1059  BP: 138/70  Pulse: 78  Temp: 98 F (36.7 C)  Weight: 218 lb 3.2 oz (99 kg)  Height: $Remove'5\' 2"'qOWTmyP$  (1.575 m)  PainSc: 0-No pain   Body mass index is 39.91 kg/m.  Wt Readings from Last 3 Encounters:  03/16/21 218 lb 3.2 oz (99 kg)  10/05/20 219 lb 3.2 oz (99.4 kg)  10/01/20 217 lb 9.6 oz (98.7 kg)     Objective:  Physical Exam Vitals and  nursing note reviewed.  Constitutional:      Appearance: Normal appearance. She is obese.  HENT:     Head: Normocephalic and atraumatic.     Right Ear: Tympanic membrane, ear canal and external ear normal.     Left Ear: Tympanic membrane, ear canal and external ear normal.     Nose:     Comments: Masked     Mouth/Throat:     Comments: Masked  Eyes:     Extraocular Movements: Extraocular movements intact.     Conjunctiva/sclera: Conjunctivae normal.     Pupils: Pupils are equal, round, and reactive to light.  Cardiovascular:     Rate and Rhythm: Normal rate and regular rhythm.     Pulses: Normal pulses.     Heart sounds: Normal heart sounds.  Pulmonary:     Effort: Pulmonary effort is normal.     Breath sounds: Normal breath sounds.  Chest:  Breasts:    Tanner Score is 5.     Right: Normal.     Left: Normal.  Abdominal:     General: Bowel sounds are normal.     Palpations: Abdomen is soft.     Comments: Obese, soft. Difficult to assess organomegaly due to body habitus  Genitourinary:    Comments: deferred Musculoskeletal:        General: Normal range of motion.     Cervical back: Normal range of motion and neck supple.  Skin:    General: Skin is warm and dry.  Neurological:     General: No focal deficit present.     Mental Status: She is alert and oriented to person, place, and time.  Psychiatric:        Mood and Affect: Mood  normal.        Behavior: Behavior normal.        Assessment And Plan:     1. Encounter for general adult medical examination w/o abnormal findings Comments: A full exam was performed. Importance of monthly self breast exams was discussed with the patient. PATIENT IS ADVISED TO GET 30-45 MINUTES REGULAR EXERCISE NO LESS THAN FOUR TO FIVE DAYS PER WEEK - BOTH WEIGHTBEARING EXERCISES AND AEROBIC ARE RECOMMENDED.  PATIENT IS ADVISED TO FOLLOW A HEALTHY DIET WITH AT LEAST SIX FRUITS/VEGGIES PER DAY, DECREASE INTAKE OF RED MEAT, AND TO INCREASE FISH INTAKE TO TWO DAYS PER WEEK.  MEATS/FISH SHOULD NOT BE FRIED, BAKED OR BROILED IS PREFERABLE.  IT IS ALSO IMPORTANT TO CUT BACK ON YOUR SUGAR INTAKE. PLEASE AVOID ANYTHING WITH ADDED SUGAR, CORN SYRUP OR OTHER SWEETENERS. IF YOU MUST USE A SWEETENER, YOU CAN TRY STEVIA. IT IS ALSO IMPORTANT TO AVOID ARTIFICIALLY SWEETENERS AND DIET BEVERAGES. LASTLY, I SUGGEST WEARING SPF 50 SUNSCREEN ON EXPOSED PARTS AND ESPECIALLY WHEN IN THE DIRECT SUNLIGHT FOR AN EXTENDED PERIOD OF TIME.  PLEASE AVOID FAST FOOD RESTAURANTS AND INCREASE YOUR WATER INTAKE.  2. Hypertensive heart and renal disease with renal failure, stage 1 through stage 4 or unspecified chronic kidney disease, without heart failure Comments: Chronic, fair control. Goal BP <130/80. EKG performed, NSR w/o acute changes. Advised to follow low sodium diet.  She will f/u in 4 months.  - POCT Urinalysis Dipstick (81002) - Microalbumin / Creatinine Urine Ratio - EKG 12-Lead - CBC - CMP14+EGFR  3. Atherosclerosis of native coronary artery of native heart with stable angina pectoris ([place]) Comments: LDL above goal, now 126. I offered to add Zetia per Cardiology notes; however, she states that she is  now going to enter a lipid study with Dr. Einar Gip.   4. Prediabetes Comments: Her a1c has been elevated in the past, I will recheck this today. Advised to limit her intake of sweetened drinks, including diet.  -  Hemoglobin A1c  5. Pure hypercholesterolemia Comments: Chronic, not at goal. Please see #3.   6. Sciatica of right side Comments: Chronic, she agrees to PT referral. She would like to go to a [place] location if possible. She was also given stretching exercises to perform at home.  - Ambulatory referral to Physical Therapy  7. Class 2 severe obesity due to excess calories with serious comorbidity and body mass index (BMI) of 39.0 to 39.9 in adult [facility]) Comments: She is encouraged to strive for BMI <30 to decrease caridac risk. Advised to aim for at least 150 minutes of exercise per week.   Patient was given opportunity to ask questions. Patient verbalized understanding of the plan and was able to repeat key elements of the plan. All questions were answered to their satisfaction.   I, [name], MD, have reviewed all documentation for this visit. The documentation on 03/21/21 for the exam, diagnosis, procedures, and orders are all accurate and complete.   THE PATIENT IS ENCOURAGED TO PRACTICE SOCIAL DISTANCING DUE TO THE COVID-19 PANDEMIC.

## 2021-03-16 NOTE — Patient Instructions (Signed)

## 2021-03-17 HISTORY — PX: CATARACT EXTRACTION: SUR2

## 2021-03-17 LAB — MICROALBUMIN / CREATININE URINE RATIO
Creatinine, Urine: 155.8 mg/dL
Microalb/Creat Ratio: 67 mg/g creat — ABNORMAL HIGH (ref 0–29)
Microalbumin, Urine: 104.3 ug/mL

## 2021-03-17 LAB — CBC
Hematocrit: 40 % (ref 34.0–46.6)
Hemoglobin: 12.2 g/dL (ref 11.1–15.9)
MCH: 22.1 pg — ABNORMAL LOW (ref 26.6–33.0)
MCHC: 30.5 g/dL — ABNORMAL LOW (ref 31.5–35.7)
MCV: 72 fL — ABNORMAL LOW (ref 79–97)
Platelets: 408 10*3/uL (ref 150–450)
RBC: 5.53 x10E6/uL — ABNORMAL HIGH (ref 3.77–5.28)
RDW: 15.6 % — ABNORMAL HIGH (ref 11.7–15.4)
WBC: 8.2 10*3/uL (ref 3.4–10.8)

## 2021-03-17 LAB — CMP14+EGFR
ALT: 9 IU/L (ref 0–32)
AST: 12 IU/L (ref 0–40)
Albumin/Globulin Ratio: 1.9 (ref 1.2–2.2)
Albumin: 4.3 g/dL (ref 3.6–4.6)
Alkaline Phosphatase: 163 IU/L — ABNORMAL HIGH (ref 44–121)
BUN/Creatinine Ratio: 12 (ref 12–28)
BUN: 15 mg/dL (ref 8–27)
Bilirubin Total: 0.4 mg/dL (ref 0.0–1.2)
CO2: 23 mmol/L (ref 20–29)
Calcium: 9.2 mg/dL (ref 8.7–10.3)
Chloride: 103 mmol/L (ref 96–106)
Creatinine, Ser: 1.29 mg/dL — ABNORMAL HIGH (ref 0.57–1.00)
Globulin, Total: 2.3 g/dL (ref 1.5–4.5)
Glucose: 99 mg/dL (ref 70–99)
Potassium: 4.5 mmol/L (ref 3.5–5.2)
Sodium: 140 mmol/L (ref 134–144)
Total Protein: 6.6 g/dL (ref 6.0–8.5)
eGFR: 42 mL/min/{1.73_m2} — ABNORMAL LOW (ref >59)

## 2021-03-17 LAB — HEMOGLOBIN A1C
Est. average glucose Bld gHb Est-mCnc: 117 mg/dL
Hgb A1c MFr Bld: 5.7 % — ABNORMAL HIGH (ref 4.8–5.6)

## 2021-03-18 ENCOUNTER — Telehealth: Payer: Self-pay

## 2021-03-18 ENCOUNTER — Telehealth: Payer: Medicare Other

## 2021-03-18 NOTE — Telephone Encounter (Signed)
°  Care Management   Follow Up Note   03/18/2021 Name: Kathryn [name] MRN: [id number] DOB: 08-02-1940   Referred by: Glendale Chard, MD Reason for referral : Chronic Care Management (RN CM Follow up call )   An unsuccessful telephone outreach was attempted today. The patient was referred to the case management team for assistance with care management and care coordination.   Follow Up Plan: A HIPPA compliant phone message was left for the patient providing contact information and requesting a return call.   Barb Merino, RN, BSN, CCM Care Management Coordinator [place] [facility]  Direct Phone: [phone]

## 2021-03-25 DIAGNOSIS — H25812 Combined forms of age-related cataract, left eye: Secondary | ICD-10-CM | POA: Diagnosis not present

## 2021-03-26 ENCOUNTER — Encounter (HOSPITAL_COMMUNITY)
Admission: RE | Admit: 2021-03-26 | Discharge: 2021-03-26 | Disposition: A | Discharge status: Home / Self Care | Payer: Medicare Other | Source: Ambulatory Visit | Attending: Ophthalmology | Admitting: Ophthalmology

## 2021-03-26 ENCOUNTER — Encounter (HOSPITAL_COMMUNITY): Payer: Self-pay

## 2021-03-26 HISTORY — DX: Unspecified osteoarthritis, unspecified site: M19.90

## 2021-03-30 ENCOUNTER — Other Ambulatory Visit: Payer: Self-pay

## 2021-03-30 ENCOUNTER — Other Ambulatory Visit: Payer: Self-pay | Admitting: Internal Medicine

## 2021-03-30 ENCOUNTER — Ambulatory Visit: Payer: Medicare Other

## 2021-03-30 DIAGNOSIS — I6523 Occlusion and stenosis of bilateral carotid arteries: Secondary | ICD-10-CM | POA: Diagnosis not present

## 2021-03-30 NOTE — H&P (Signed)
Surgical History & Physical  Patient Name: Claude Swendsen DOB: 19-Apr-1940  Surgery: Cataract extraction with intraocular lens implant phacoemulsification; Left Eye  Surgeon: Baruch Goldmann MD Surgery Date:  04-02-21 Pre-Op Date:  03-25-21  HPI: A 17 Yr. old female patient is referred by Dr Jorja Loa for cataract eval. 1. The patient complains of poor night vision, which began 6 months ago. Both eyes are affected. The episode is gradual. The condition's severity increased since last visit. Symptoms occur when the patient is driving, inside and outside. This is negatively affecting the patient's quality of life and the patient is unable to function adequately in life with the current level of vision. HPI was performed by Baruch Goldmann .  Medical History: Dry Eyes Cataracts Subconjunctival hemorrhage OD 2009 Diabetes High Blood Pressure LDL  Review of Systems Negative Allergic/Immunologic Negative Cardiovascular Negative Constitutional Negative Ear, Nose, Mouth & Throat Negative Endocrine Negative Eyes Negative Gastrointestinal Negative Genitourinary Negative Hemotologic/Lymphatic Negative Integumentary Negative Musculoskeletal Negative Neurological Negative Psychiatry Negative Respiratory  Social   Former smoker   Medication Alprazolam, Carvedilol, Cetirizine, Valsartan, HCTZ, Pregabalin, Gabapentin, Ezetimibe, Furosemide, Amlodipine,   Sx/Procedures Gallbladder Removal, Hernia Sx, Hysterectomy,   Drug Allergies  Codeine,   History & Physical: Heent: Cataract, Left Eye NECK: supple without bruits LUNGS: lungs clear to auscultation CV: regular rate and rhythm Abdomen: soft and non-tender Impression & Plan: Assessment: 1.  COMBINED FORMS AGE RELATED CATARACT; Both Eyes (H25.813) 2.  BLEPHARITIS; Right Upper Lid, Right Lower Lid, Left Upper Lid, Left Lower Lid (H01.001, H01.002,H01.004,H01.005) 3.  DERMATOCHALASIS, no surgery; Right Upper Lid, Left Upper Lid  (H02.831, H02.834) 4.  ASTIGMATISM, REGULAR; Both Eyes (H52.223) 5.  OAG BORDERLINE FINDINGS LOW RISK; Both Eyes (H40.013)  Plan: 1.  Cataract accounts for the patient's decreased vision. This visual impairment is not correctable with a tolerable change in glasses or contact lenses. Cataract surgery with an implantation of a new lens should significantly improve the visual and functional status of the patient. Discussed all risks, benefits, alternatives, and potential complications. Discussed the procedures and recovery. Patient desires to have surgery. A-scan ordered and performed today for intra-ocular lens calculations. The surgery will be performed in order to improve vision for driving, reading, and for eye examinations. Recommend phacoemulsification with intra-ocular lens. Recommend Dextenza for post-operative pain and inflammation. Left eye worse - first. Dilates well - shugarcaine by protocol. Consider Toric Lens - Eyehance or Vivity.  2.  Recommend regular lid cleaning.  3.  Asymptomatic, recommend observation for now. Findings, prognosis and treatment options reviewed.  4.  recommend toric IOL OU.  5.  Based on cup-to-disc ratio. IOPs low normal today. OCT rNFL shows: WNL OU. Low risk of glaucoma - continue to monitor.

## 2021-04-01 ENCOUNTER — Other Ambulatory Visit: Payer: Self-pay

## 2021-04-01 MED ORDER — ALPRAZOLAM 0.25 MG PO TABS
ORAL_TABLET | ORAL | 0 refills | Status: DC
Start: 2021-04-01 — End: 2021-04-04

## 2021-04-01 NOTE — Telephone Encounter (Signed)
Please send patient a refill of her xanax. YL,RMA

## 2021-04-01 NOTE — Progress Notes (Signed)
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 Duplex suggests stenosis in the right external carotid artery (>50%). Duplex suggests stenosis in the left internal carotid artery (16-49%). Duplex suggests stenosis in the left external carotid artery (<50%). Antegrade right vertebral artery flow. Antegrade left vertebral artery flow. Compared to study done on 05/12/2020, right ICA stenosis of >70% no longer demonstrated.  Otherwise no significant change.  Suspect external carotid artery stenosis contributed to previous high velocity. Follow up in one year is appropriate if clinically indicated.  Mild disease and she is seeing you soon

## 2021-04-02 ENCOUNTER — Ambulatory Visit (HOSPITAL_COMMUNITY)
Admission: RE | Admit: 2021-04-02 | Discharge: 2021-04-02 | Disposition: A | Discharge status: Home / Self Care | Payer: Medicare Other | Attending: Ophthalmology | Admitting: Ophthalmology

## 2021-04-02 ENCOUNTER — Encounter (HOSPITAL_COMMUNITY)
Admission: RE | Disposition: A | Discharge status: Home / Self Care | Payer: Self-pay | Source: Home / Self Care | Attending: Ophthalmology

## 2021-04-02 ENCOUNTER — Ambulatory Visit (HOSPITAL_COMMUNITY): Payer: Medicare Other | Admitting: Anesthesiology

## 2021-04-02 ENCOUNTER — Ambulatory Visit (HOSPITAL_BASED_OUTPATIENT_CLINIC_OR_DEPARTMENT_OTHER): Payer: Medicare Other | Admitting: Anesthesiology

## 2021-04-02 DIAGNOSIS — E1136 Type 2 diabetes mellitus with diabetic cataract: Secondary | ICD-10-CM | POA: Insufficient documentation

## 2021-04-02 DIAGNOSIS — I2510 Atherosclerotic heart disease of native coronary artery without angina pectoris: Secondary | ICD-10-CM | POA: Insufficient documentation

## 2021-04-02 DIAGNOSIS — H0100B Unspecified blepharitis left eye, upper and lower eyelids: Secondary | ICD-10-CM | POA: Insufficient documentation

## 2021-04-02 DIAGNOSIS — Z955 Presence of coronary angioplasty implant and graft: Secondary | ICD-10-CM | POA: Diagnosis not present

## 2021-04-02 DIAGNOSIS — M1990 Unspecified osteoarthritis, unspecified site: Secondary | ICD-10-CM | POA: Diagnosis not present

## 2021-04-02 DIAGNOSIS — H02831 Dermatochalasis of right upper eyelid: Secondary | ICD-10-CM | POA: Insufficient documentation

## 2021-04-02 DIAGNOSIS — E1151 Type 2 diabetes mellitus with diabetic peripheral angiopathy without gangrene: Secondary | ICD-10-CM | POA: Diagnosis not present

## 2021-04-02 DIAGNOSIS — N289 Disorder of kidney and ureter, unspecified: Secondary | ICD-10-CM | POA: Diagnosis not present

## 2021-04-02 DIAGNOSIS — H25812 Combined forms of age-related cataract, left eye: Secondary | ICD-10-CM

## 2021-04-02 DIAGNOSIS — I25119 Atherosclerotic heart disease of native coronary artery with unspecified angina pectoris: Secondary | ICD-10-CM | POA: Diagnosis not present

## 2021-04-02 DIAGNOSIS — H0100A Unspecified blepharitis right eye, upper and lower eyelids: Secondary | ICD-10-CM | POA: Insufficient documentation

## 2021-04-02 DIAGNOSIS — H02834 Dermatochalasis of left upper eyelid: Secondary | ICD-10-CM | POA: Diagnosis not present

## 2021-04-02 DIAGNOSIS — I10 Essential (primary) hypertension: Secondary | ICD-10-CM | POA: Diagnosis not present

## 2021-04-02 DIAGNOSIS — G8918 Other acute postprocedural pain: Secondary | ICD-10-CM | POA: Insufficient documentation

## 2021-04-02 DIAGNOSIS — H52223 Regular astigmatism, bilateral: Secondary | ICD-10-CM | POA: Diagnosis not present

## 2021-04-02 DIAGNOSIS — H5712 Ocular pain, left eye: Secondary | ICD-10-CM

## 2021-04-02 DIAGNOSIS — I252 Old myocardial infarction: Secondary | ICD-10-CM | POA: Insufficient documentation

## 2021-04-02 DIAGNOSIS — Z87891 Personal history of nicotine dependence: Secondary | ICD-10-CM | POA: Diagnosis not present

## 2021-04-02 SURGERY — CATARACT EXTRACTION PHACO AND INTRAOCULAR LENS PLACEMENT (IOC) with placement of Corticosteroid
Anesthesia: Monitor Anesthesia Care | Site: Eye | Laterality: Left

## 2021-04-02 MED ORDER — LIDOCAINE HCL (PF) 1 % IJ SOLN
INTRAOCULAR | Status: DC | PRN
  Administered 2021-04-02: 1 mL via OPHTHALMIC

## 2021-04-02 MED ORDER — LIDOCAINE HCL 3.5 % OP GEL
1.0000 "application " | Freq: Once | OPHTHALMIC | Status: AC
  Administered 2021-04-02: 1 via OPHTHALMIC

## 2021-04-02 MED ORDER — MIDAZOLAM HCL 2 MG/2ML IJ SOLN
INTRAMUSCULAR | Status: AC
  Filled 2021-04-02: qty 2

## 2021-04-02 MED ORDER — SODIUM CHLORIDE 0.9% FLUSH
INTRAVENOUS | Status: DC | PRN
  Administered 2021-04-02: 5 mL via INTRAVENOUS

## 2021-04-02 MED ORDER — SODIUM HYALURONATE 23MG/ML IO SOSY
PREFILLED_SYRINGE | INTRAOCULAR | Status: DC | PRN
  Administered 2021-04-02: 0.6 mL via INTRAOCULAR

## 2021-04-02 MED ORDER — DEXAMETHASONE 0.4 MG OP INST
VAGINAL_INSERT | OPHTHALMIC | Status: DC | PRN
  Administered 2021-04-02: 0.4 mg via OPHTHALMIC

## 2021-04-02 MED ORDER — ORAL CARE MOUTH RINSE: 15.0000 mL | Freq: Once | OROMUCOSAL | Status: DC

## 2021-04-02 MED ORDER — MIDAZOLAM HCL 2 MG/2ML IJ SOLN
1.0000 mg | Freq: Once | INTRAMUSCULAR | Status: AC
Start: 2021-04-02 — End: 2021-04-02
  Administered 2021-04-02: 1 mg via INTRAVENOUS

## 2021-04-02 MED ORDER — PHENYLEPHRINE HCL 2.5 % OP SOLN
1.0000 [drp] | OPHTHALMIC | Status: AC | PRN
  Administered 2021-04-02 (×3): 1 [drp] via OPHTHALMIC

## 2021-04-02 MED ORDER — SODIUM HYALURONATE 10 MG/ML IO SOLUTION
PREFILLED_SYRINGE | INTRAOCULAR | Status: DC | PRN
  Administered 2021-04-02: 0.85 mL via INTRAOCULAR

## 2021-04-02 MED ORDER — MIDAZOLAM HCL 2 MG/2ML IJ SOLN
INTRAMUSCULAR | Status: DC | PRN
  Administered 2021-04-02: 1 mg via INTRAVENOUS

## 2021-04-02 MED ORDER — TETRACAINE HCL 0.5 % OP SOLN
1.0000 [drp] | OPHTHALMIC | Status: AC | PRN
  Administered 2021-04-02 (×3): 1 [drp] via OPHTHALMIC

## 2021-04-02 MED ORDER — STERILE WATER FOR IRRIGATION IR SOLN
Status: DC | PRN
  Administered 2021-04-02: 250 mL

## 2021-04-02 MED ORDER — EPINEPHRINE PF 1 MG/ML IJ SOLN
INTRAMUSCULAR | Status: AC
  Filled 2021-04-02: qty 1

## 2021-04-02 MED ORDER — POVIDONE-IODINE 5 % OP SOLN
OPHTHALMIC | Status: DC | PRN
  Administered 2021-04-02: 1 via OPHTHALMIC

## 2021-04-02 MED ORDER — TROPICAMIDE 1 % OP SOLN
1.0000 [drp] | OPHTHALMIC | Status: AC | PRN
  Administered 2021-04-02 (×3): 1 [drp] via OPHTHALMIC

## 2021-04-02 MED ORDER — BSS IO SOLN
INTRAOCULAR | Status: DC | PRN
  Administered 2021-04-02: 15 mL via INTRAOCULAR

## 2021-04-02 MED ORDER — CHLORHEXIDINE GLUCONATE 0.12 % MT SOLN: 15.0000 mL | Freq: Once | OROMUCOSAL | Status: DC

## 2021-04-02 MED ORDER — DEXAMETHASONE 0.4 MG OP INST
VAGINAL_INSERT | OPHTHALMIC | Status: AC
  Filled 2021-04-02: qty 1

## 2021-04-02 MED ORDER — EPINEPHRINE PF 1 MG/ML IJ SOLN
INTRAOCULAR | Status: DC | PRN
  Administered 2021-04-02: 500 mL

## 2021-04-02 SURGICAL SUPPLY — 17 items
CATARACT SUITE SIGHTPATH (MISCELLANEOUS) ×2 IMPLANT
CLOTH BEACON ORANGE TIMEOUT ST (SAFETY) ×2 IMPLANT
EYE SHIELD UNIVERSAL CLEAR (GAUZE/BANDAGES/DRESSINGS) ×1 IMPLANT
FEE CATARACT SUITE SIGHTPATH (MISCELLANEOUS) ×1 IMPLANT
GLOVE SURG UNDER POLY LF SZ6.5 (GLOVE) ×1 IMPLANT
GLOVE SURG UNDER POLY LF SZ7 (GLOVE) ×3 IMPLANT
GOWN STRL REUS W/ TWL LRG LVL3 (GOWN DISPOSABLE) IMPLANT
GOWN STRL REUS W/TWL LRG LVL3 (GOWN DISPOSABLE) ×4
LENS IOL RAYNER 17.5 (Intraocular Lens) ×2 IMPLANT
LENS IOL RAYONE EMV 17.5 (Intraocular Lens) IMPLANT
NDL HYPO 18GX1.5 BLUNT FILL (NEEDLE) ×1 IMPLANT
NEEDLE HYPO 18GX1.5 BLUNT FILL (NEEDLE) ×2 IMPLANT
PAD ARMBOARD 7.5X6 YLW CONV (MISCELLANEOUS) ×2 IMPLANT
SYR TB 1ML LL NO SAFETY (SYRINGE) ×2 IMPLANT
TAPE SURG TRANSPORE 1 IN (GAUZE/BANDAGES/DRESSINGS) IMPLANT
TAPE SURGICAL TRANSPORE 1 IN (GAUZE/BANDAGES/DRESSINGS) ×2
WATER STERILE IRR 250ML POUR (IV SOLUTION) ×2 IMPLANT

## 2021-04-02 NOTE — Transfer of Care (Signed)
Immediate Anesthesia Transfer of Care Note  Patient: Kathryn [name]  Procedure(s) Performed: CATARACT EXTRACTION PHACO AND INTRAOCULAR LENS PLACEMENT (IOC) with placement of Corticosteroid (Left: Eye)  Patient Location: Short Stay  Anesthesia Type:MAC  Level of Consciousness: awake, alert  and oriented  Airway & Oxygen Therapy: Patient Spontanous Breathing  Post-op Assessment: Report given to RN, Post -op Vital signs reviewed and stable and Patient moving all extremities X 4  Post vital signs: Reviewed and stable  Last Vitals:  Vitals Value Taken Time  BP    Temp    Pulse    Resp    SpO2      Last Pain:  Vitals:   04/02/21 1111  TempSrc: Oral  PainSc: 0-No pain      Patients Stated Pain Goal: 6 (16/58/00 6349)  Complications: No notable events documented.

## 2021-04-02 NOTE — Discharge Instructions (Addendum)
Please discharge patient when stable, will follow up today with Dr. Wrzosek at the [facility] office immediately following discharge.  Leave shield in place until visit.  All paperwork with discharge instructions will be given at the office.  [facility] Address:  [address]  [place], [place] [address]  

## 2021-04-02 NOTE — Interval H&P Note (Signed)
History and Physical Interval Note:  04/02/2021 11:58 AM  [place]  has presented today for surgery, with the diagnosis of combined forms age related cataract, left eye.  The various methods of treatment have been discussed with the patient and family. After consideration of risks, benefits and other options for treatment, the patient has consented to  Procedure(s) with comments: CATARACT EXTRACTION PHACO AND INTRAOCULAR LENS PLACEMENT ([place]) with placement of Corticosteroid (Left) - left as a surgical intervention.  The patient's history has been reviewed, patient examined, no change in status, stable for surgery.  I have reviewed the patient's chart and labs.  Questions were answered to the patient's satisfaction.     Baruch Goldmann

## 2021-04-02 NOTE — Op Note (Signed)
Date of procedure: 04/02/21  Pre-operative diagnosis: Visually significant age-related combined cataract, Left Eye (H25.812)  Post-operative diagnosis:  Visually significant age-related combined cataract, Left Eye (H25.812) 2.   Pain and inflammation following cataract surgery, Left Eye (H57.12)  Procedure:  Removal of cataract via phacoemulsification and insertion of intra-ocular lens Rayner [id number] +17.5D into the capsular bag of the Left Eye 2. Placement of Dextenza Implant, Left Lower Lid  Attending surgeon: Gerda Diss. Wanda Cellucci, MD, MA  Anesthesia: MAC, Topical [place]  Complications: None  Estimated Blood Loss: <10m (minimal)  Specimens: None  Implants: As above  Indications:  Visually significant age-related cataract, Left Eye  Procedure:  The patient was seen and identified in the pre-operative area. The operative eye was identified and dilated.  The operative eye was marked.  Topical anesthesia was administered to the operative eye.     The patient was then to the operative suite and placed in the supine position.  A timeout was performed confirming the patient, procedure to be performed, and all other relevant information.   The patient's face was prepped and draped in the usual fashion for intra-ocular surgery.  A lid speculum was placed into the operative eye and the surgical microscope moved into place and focused.  An inferotemporal paracentesis was created using a 20 gauge paracentesis blade.  Shugarcaine was injected into the anterior chamber.  Viscoelastic was injected into the anterior chamber.  A temporal clear-corneal main wound incision was created using a 2.455mmicrokeratome.  A continuous curvilinear capsulorrhexis was initiated using an irrigating cystitome and completed using capsulorrhexis forceps.  Hydrodissection and hydrodeliniation were performed.  Viscoelastic was injected into the anterior chamber.  A phacoemulsification handpiece and a chopper as a second  instrument were used to remove the nucleus and epinucleus. The irrigation/aspiration handpiece was used to remove any remaining cortical material.   The capsular bag was reinflated with viscoelastic, checked, and found to be intact.  The intraocular lens was inserted into the capsular bag.  The irrigation/aspiration handpiece was used to remove any remaining viscoelastic.  The clear corneal wound and paracentesis wounds were then hydrated and checked with Weck-Cels to be watertight.    The lid-speculum was removed. The lower punctum was dilated. A Dextenza implant was placed in the lower canaliculus without complication.  The drape was removed.  The patient's face was cleaned with a wet and dry 4x4.   A clear shield was taped over the eye. The patient was taken to the post-operative care unit in good condition, having tolerated the procedure well.  Post-Op Instructions: [facility] and will receive all other orders and instructions.

## 2021-04-02 NOTE — Anesthesia Procedure Notes (Signed)
Procedure Name: MAC Date/Time: 04/02/2021 12:41 PM Performed by: Orlie Dakin, CRNA Pre-anesthesia Checklist: Patient identified, Emergency Drugs available, Suction available and Patient being monitored Patient Re-evaluated:Patient Re-evaluated prior to induction Oxygen Delivery Method: Nasal cannula Placement Confirmation: positive ETCO2

## 2021-04-02 NOTE — Anesthesia Postprocedure Evaluation (Signed)
Anesthesia Post Note  Patient: [name]  Procedure(s) Performed: CATARACT EXTRACTION PHACO AND INTRAOCULAR LENS PLACEMENT (IOC) with placement of Corticosteroid (Left: Eye)  Patient location during evaluation: Phase II Anesthesia Type: MAC Level of consciousness: awake and alert and oriented Pain management: pain level controlled Vital Signs Assessment: post-procedure vital signs reviewed and stable Respiratory status: spontaneous breathing, nonlabored ventilation and respiratory function stable Cardiovascular status: blood pressure returned to baseline and stable Postop Assessment: no apparent nausea or vomiting Anesthetic complications: no   No notable events documented.   Last Vitals:  Vitals:   04/02/21 1126 04/02/21 1307  BP: (!) 143/78 131/70  Pulse:  70  Resp:  17  Temp:  36.9 C  SpO2:  100%    Last Pain:  Vitals:   04/02/21 1307  TempSrc: Oral  PainSc: 0-No pain                 Yasseen Salls C Japleen Tornow

## 2021-04-02 NOTE — Anesthesia Preprocedure Evaluation (Signed)
Anesthesia Evaluation  Patient identified by MRN, date of birth, ID band Patient awake    Reviewed: Allergy & Precautions, NPO status , Patient's Chart, lab work & pertinent test results, reviewed documented beta blocker date and time   Airway Mallampati: III  TM Distance: >3 FB Neck ROM: Full    Dental  (+) Dental Advisory Given, Missing   Pulmonary neg pulmonary ROS, former smoker,    Pulmonary exam normal breath sounds clear to auscultation       Cardiovascular hypertension, Pt. on home beta blockers and Pt. on medications + angina + CAD, + Past MI, + Cardiac Stents and + Peripheral Vascular Disease  Normal cardiovascular exam Rhythm:Regular Rate:Normal     Neuro/Psych  Headaches, PSYCHIATRIC DISORDERS Anxiety    GI/Hepatic negative GI ROS, Neg liver ROS,   Endo/Other  negative endocrine ROS  Renal/GU Renal InsufficiencyRenal disease  negative genitourinary   Musculoskeletal  (+) Arthritis , Osteoarthritis,    Abdominal   Peds negative pediatric ROS (+)  Hematology negative hematology ROS (+)   Anesthesia Other Findings Chronic neck pain and back pain  Reproductive/Obstetrics negative OB ROS                            Anesthesia Physical Anesthesia Plan  ASA: 3  Anesthesia Plan: MAC   Post-op Pain Management: Minimal or no pain anticipated   Induction:   PONV Risk Score and Plan:   Airway Management Planned: Nasal Cannula and Natural Airway  Additional Equipment:   Intra-op Plan:   Post-operative Plan:   Informed Consent: I have reviewed the patients History and Physical, chart, labs and discussed the procedure including the risks, benefits and alternatives for the proposed anesthesia with the patient or authorized representative who has indicated his/her understanding and acceptance.     Dental advisory given  Plan Discussed with: CRNA and Surgeon  Anesthesia Plan  Comments:         Anesthesia Quick Evaluation

## 2021-04-03 ENCOUNTER — Other Ambulatory Visit: Payer: Self-pay | Admitting: Internal Medicine

## 2021-04-04 ENCOUNTER — Other Ambulatory Visit: Payer: Self-pay | Admitting: Internal Medicine

## 2021-04-04 MED ORDER — ALPRAZOLAM 0.25 MG PO TABS: ORAL_TABLET | ORAL | 0 refills | Status: DC

## 2021-04-06 ENCOUNTER — Other Ambulatory Visit: Payer: Medicare Other

## 2021-04-08 DIAGNOSIS — H25811 Combined forms of age-related cataract, right eye: Secondary | ICD-10-CM | POA: Diagnosis not present

## 2021-04-12 ENCOUNTER — Ambulatory Visit: Payer: Medicare Other | Admitting: Student

## 2021-04-12 ENCOUNTER — Encounter (HOSPITAL_COMMUNITY)
Admission: RE | Admit: 2021-04-12 | Discharge: 2021-04-12 | Disposition: A | Discharge status: Home / Self Care | Payer: Medicare Other | Source: Ambulatory Visit | Attending: Ophthalmology | Admitting: Ophthalmology

## 2021-04-12 ENCOUNTER — Encounter (HOSPITAL_COMMUNITY): Payer: Self-pay

## 2021-04-12 NOTE — Progress Notes (Signed)
Primary Physician/Referring:  Glendale Chard, MD  Patient ID: Kathryn [name], female    DOB: 06-24-1940, 81 y.o.   MRN: [id number]  Chief Complaint  Patient presents with   Follow-up    6 month   Coronary Artery Disease   carotid dz   HPI:    Kathryn [name]  is a 81 y.o. AAFwith CAD s/p PCI to LCx 2009, hypertension, statin intolerant hyperlipidemia, CKD stage 3 and mild bilateral asymptomatic carotid stenosis. She has had an episode of syncope that occurred in May 2020 suggestive of vasovagal episodes, no recurrence. Coronary Angiography on 01/16/18 that showed no obstructive CAD and patent stents from 2009. Patient is not on statin due to intolerance, she is enrolled in esperion trial. Patient does have history of chronic left-sided post herpetic chest pain.  Patient presents for 45-month follow-up.  Last office visit patient was stable from a cardiovascular standpoint, therefore no changes were made.  On recent carotid artery duplex right ICA stenosis greater than 70%, no longer demonstrated we will therefore plan to follow-up with annual surveillance.   Patient is feeling well overall without specific complaints today.  Denies chest pain, worsening dyspnea.  I personally reviewed external labs, lipids are not well controlled.  Patient was previously on pravastatin and Zetia, however it is unclear if patient has been taking pravastatin.  Past Medical History:  Diagnosis Date   Anxiety    takes Xanax bid prn   Arthritis    Bruises easily    pt is on Plavix   Chronic back pain    buldging disc   Chronic neck pain    Coronary artery disease    1 stent    Dysphagia    Headache(784.0)    related to cervical issues   History of colonic polyps    Hyperlipidemia    takes Crestor daily   Hypertension    takes Coreg and Diovan daily   Myocardial infarction ([place]) 2019   Osteoporosis    was taking shot 2xyr;but not taking anymore   Peripheral vascular disease (HCC)     Seasonal allergies    takes Zyrtec nightly   Family History  Problem Relation Age of Onset   Heart disease Mother        Heart Dissease before age 38   Heart attack Mother    Cancer Father    Cancer Sister    Leukemia Sister    Cancer Brother    Heart disease Brother        Amputation   Heart attack Brother    Heart disease Brother    Heart attack Brother    Dementia Other    Anesthesia problems Neg Hx    Hypotension Neg Hx    Malignant hyperthermia Neg Hx    Pseudochol deficiency Neg Hx    Past Surgical History:  Procedure Laterality Date   ABDOMINAL HYSTERECTOMY  70's   BUNIONECTOMY     left foot   CARDIAC CATHETERIZATION  2009/2011   1 stent placed   CATARACT EXTRACTION Left 03/2021   CHOLECYSTECTOMY  2009   COLONOSCOPY     ESOPHAGOGASTRODUODENOSCOPY     LEFT HEART CATH AND CORONARY ANGIOGRAPHY N/A 01/16/2018   Procedure: LEFT HEART CATH AND CORONARY ANGIOGRAPHY;  Surgeon: Nigel Mormon, MD;  Location: [facility];  Service: Cardiovascular;  Laterality: N/A;   TONSILLECTOMY     at age 36   Social History   Tobacco Use   Smoking status:  Former    Packs/day: 0.25    Years: 5.00    Pack years: 1.25    Types: Cigarettes    Quit date: 11/27/1968    Years since quitting: 52.4   Smokeless tobacco: Never  Substance Use Topics   Alcohol use: No   Marital status: Married  ROS  Review of Systems  Cardiovascular:  Positive for dyspnea on exertion (stable, chronic). Negative for chest pain (No recurrence, history of postherpetic pain), claudication, leg swelling, near-syncope, orthopnea, palpitations, paroxysmal nocturnal dyspnea and syncope.  Respiratory:  Negative for shortness of breath.   Musculoskeletal:  Positive for arthritis.  All other systems reviewed and are negative. Objective   Vitals with BMI 04/13/2021 04/02/2021 04/02/2021  Height 5\' 2"  - -  Weight 219 lbs 3 oz - -  BMI 79.89 - -  Systolic 211 941 740  Diastolic 71 70 78  Pulse 65  70 -      Physical Exam Vitals reviewed.  Constitutional:      Appearance: She is obese.     Comments: Moderately built and moderately obese in no acute distress.  Neck:     Comments: Short neck Cardiovascular:     Rate and Rhythm: Normal rate and regular rhythm.     Pulses: Intact distal pulses.          Carotid pulses are  on the right side with bruit and  on the left side with bruit.    Heart sounds: Murmur heard.  Harsh midsystolic murmur is present with a grade of 3/6 at the upper right sternal border radiating to the neck.    No gallop.     Comments: No JVD Pulmonary:     Effort: Pulmonary effort is normal.     Breath sounds: Normal breath sounds.  Abdominal:     Comments: Obese with mild pannus  Musculoskeletal:     Right lower leg: No edema.     Left lower leg: No edema.   Laboratory examination:    Recent Labs    05/28/20 1128 10/05/20 1215 03/16/21 1159  NA 146* 142 140  K 4.9 5.2 4.5  CL 106 105 103  CO2 22 22 23   GLUCOSE 100* 98 99  BUN 26 12 15   CREATININE 1.41* 1.09* 1.29*  CALCIUM 9.5 9.1 9.2   CrCl cannot be calculated (Patient's most recent lab result is older than the maximum 21 days allowed.).  CMP Latest Ref Rng & Units 03/16/2021 10/05/2020 05/28/2020  Glucose 70 - 99 mg/dL 99 98 100(H)  BUN 8 - 27 mg/dL 15 12 26   Creatinine 0.57 - 1.00 mg/dL 1.29(H) 1.09(H) 1.41(H)  Sodium 134 - 144 mmol/L 140 142 146(H)  Potassium 3.5 - 5.2 mmol/L 4.5 5.2 4.9  Chloride 96 - 106 mmol/L 103 105 106  CO2 20 - 29 mmol/L 23 22 22   Calcium 8.7 - 10.3 mg/dL 9.2 9.1 9.5  Total Protein 6.0 - 8.5 g/dL 6.6 6.3 6.7  Total Bilirubin 0.0 - 1.2 mg/dL 0.4 0.4 0.3  Alkaline Phos 44 - 121 IU/L 163(H) 138(H) 127(H)  AST 0 - 40 IU/L 12 17 19   ALT 0 - 32 IU/L 9 14 12    CBC Latest Ref Rng & Units 03/16/2021 10/05/2020 04/23/2019  WBC 3.4 - 10.8 x10E3/uL 8.2 7.9 8.4  Hemoglobin 11.1 - 15.9 g/dL 12.2 11.8 12.5  Hematocrit 34.0 - 46.6 % 40.0 37.3 40.4  Platelets 150 - 450  x10E3/uL 408 393 463(H)   Lipid Panel  Component Value Date/Time   CHOL 206 (H) 02/16/2021 1505   TRIG 86 02/16/2021 1505   HDL 65 02/16/2021 1505   CHOLHDL 3.0 02/13/2020 1602   LDLCALC 126 (H) 02/16/2021 1505   HEMOGLOBIN A1C Lab Results  Component Value Date   HGBA1C 5.7 (H) 03/16/2021   TSH No results for input(s): TSH in the last 8760 hours.  Allergies   Allergies  Allergen Reactions   Codeine Hypertension   Shellfish Allergy     Medications Prior to Visit:   Outpatient Medications Prior to Visit  Medication Sig Dispense Refill   acetaminophen (TYLENOL) 325 MG tablet Take 325 mg by mouth every 6 (six) hours as needed for mild pain.     ALPRAZolam (XANAX) 0.25 MG tablet TAKE (1) TABLET BY MOUTH TWICE DAILY AS NEEDED FOR ANXIETY. 30 tablet 0   amLODipine (NORVASC) 10 MG tablet TAKE ONE TABLET BY MOUTH ONCE DAILY. 90 tablet 0   Apoaequorin (PREVAGEN PO) Take 1 tablet by mouth daily.     Aspirin 81 MG CAPS      CALCIUM PO Take 1 tablet by mouth daily. 600 mg     carvedilol (COREG) 12.5 MG tablet TAKE (1) TABLET BY MOUTH TWICE DAILY. 180 tablet 0   cetirizine (ZYRTEC) 10 MG tablet Take 10 mg by mouth at bedtime.     diclofenac Sodium (VOLTAREN) 1 % GEL Apply 4 g topically 4 (four) times daily. 150 g 1   ezetimibe (ZETIA) 10 MG tablet Take 1 tablet (10 mg total) by mouth daily. 30 tablet 11   furosemide (LASIX) 20 MG tablet TAKE 1 TABLET BY MOUTH DAILY 90 tablet 0   gabapentin (NEURONTIN) 300 MG capsule Take 300 mg by mouth at bedtime.     Magnesium 250 MG TABS Take 1 tablet by mouth daily.     Multiple Vitamin (MULTIVITAMIN WITH MINERALS) TABS Take 1 tablet by mouth daily.     nitroGLYCERIN (NITROSTAT) 0.4 MG SL tablet Place 1 tablet (0.4 mg total) under the tongue every 5 (five) minutes x 3 doses as needed for chest pain. 30 tablet 3   pregabalin (LYRICA) 75 MG capsule TAKE 1 CAPSULE BY MOUTH THREE TIMES A DAY. 90 capsule 3   triamcinolone cream (KENALOG) 0.1 % Apply  1 application topically 2 (two) times daily. 30 g 0   valACYclovir (VALTREX) 1000 MG tablet Take 1 tablet (1,000 mg total) by mouth 3 (three) times daily. 21 tablet 0   No facility-administered medications prior to visit.   Final Medications at End of Visit    Current Meds  Medication Sig   acetaminophen (TYLENOL) 325 MG tablet Take 325 mg by mouth every 6 (six) hours as needed for mild pain.   ALPRAZolam (XANAX) 0.25 MG tablet TAKE (1) TABLET BY MOUTH TWICE DAILY AS NEEDED FOR ANXIETY.   amLODipine (NORVASC) 10 MG tablet TAKE ONE TABLET BY MOUTH ONCE DAILY.   Apoaequorin (PREVAGEN PO) Take 1 tablet by mouth daily.   Aspirin 81 MG CAPS    CALCIUM PO Take 1 tablet by mouth daily. 600 mg   carvedilol (COREG) 12.5 MG tablet TAKE (1) TABLET BY MOUTH TWICE DAILY.   cetirizine (ZYRTEC) 10 MG tablet Take 10 mg by mouth at bedtime.   diclofenac Sodium (VOLTAREN) 1 % GEL Apply 4 g topically 4 (four) times daily.   ezetimibe (ZETIA) 10 MG tablet Take 1 tablet (10 mg total) by mouth daily.   furosemide (LASIX) 20 MG tablet TAKE 1 TABLET BY  MOUTH DAILY   gabapentin (NEURONTIN) 300 MG capsule Take 300 mg by mouth at bedtime.   Magnesium 250 MG TABS Take 1 tablet by mouth daily.   Multiple Vitamin (MULTIVITAMIN WITH MINERALS) TABS Take 1 tablet by mouth daily.   nitroGLYCERIN (NITROSTAT) 0.4 MG SL tablet Place 1 tablet (0.4 mg total) under the tongue every 5 (five) minutes x 3 doses as needed for chest pain.   pregabalin (LYRICA) 75 MG capsule TAKE 1 CAPSULE BY MOUTH THREE TIMES A DAY.   triamcinolone cream (KENALOG) 0.1 % Apply 1 application topically 2 (two) times daily.   valACYclovir (VALTREX) 1000 MG tablet Take 1 tablet (1,000 mg total) by mouth 3 (three) times daily.   Radiology:  No results found.   Cardiac Studies:   Sleep Study  [2011]: Negative for sleep apnea  Coronary Angiogram   01/16/18: Patent Sypher stent in Cx, 2.75 x 15 mm DES placed on 01/22/08. Normal LVEF. NO other  significant CAD.  Echocardiogram 08/28/2020:  Left ventricle cavity is normal in size. Severe concentric hypertrophy of  the left ventricle. Normal global wall motion. Normal LV systolic function  with visual EF 55-60%. Doppler evidence of grade I (impaired) diastolic  dysfunction, normal LAP.  Left atrial cavity is mildly dilated.  Trileaflet aortic valve with mild calcification.  Mild aortic stenosis.  Vmax 2.1 m/sec, mean PG 10 mmHg, AVA 1.6 cm2 by continuity equation.  Moderate (Grade II) aortic regurgitation.  Moderate (Grade II) mitral regurgitation. Mildly restricted mitral valve  leaflets.  Moderate tricuspid regurgitation. Estimated pulmonary artery systolic  pressure 39 mmHg.  No significant change compared to previous study in 2020.   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 Duplex suggests stenosis in the right external carotid artery (>50%). Duplex suggests stenosis in the left internal carotid artery (16-49%). Duplex suggests stenosis in the left external carotid artery (<50%). Antegrade right vertebral artery flow. Antegrade left vertebral artery flow. Compared to study done on 05/12/2020, right ICA stenosis of >70% no longer demonstrated.  Otherwise no significant change.  Suspect external carotid artery stenosis contributed to previous high velocity. Follow up in one year is appropriate if clinically indicated.  EKG  04/05/2021: Sinus rhythm at a rate of 70 bpm.  Normal axis.  Left atrial enlargement.  No evidence of ischemia or underlying injury pattern.  Compared EKG 10/01/2020, no significant change.  EKG 04/03/2020: Sinus rhythm at a rate of 79 bpm, left atrial enlargement. Left axis, left anterior fascicular block. Poor R wave progression, cannot exclude anteroseptal infarct old. No evidence of underlying ischemia or injury pattern. Compared to EKG 03/18/2020, left axis and left anterior fascicular block  noted.  EKG 03/18/2020: Sinus rhythm rate of 72 bpm, left atrial enlargement. Normal axis. Poor R wave progression, cannot exclude anteroseptal infarct old. Compared to EKG 11/28/2018, no significant change.  EKG 08/23/2019: Normal sinus rhythm with rate of 75 bpm, normal axis.  No evidence of ischemia, normal EKG.   Assessment     ICD-10-CM   1. Coronary artery disease of native artery of native heart with stable angina pectoris ([place])  I25.118 EKG 12-Lead    2. Essential hypertension  I10     3. Carotid stenosis, asymptomatic, bilateral  I65.23 PCV CAROTID DUPLEX (BILATERAL)      No orders of the defined types were placed in this encounter.   There are no discontinued medications.   Recommendations:    Kathryn [name]  is  a 81 y.o. 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 She has had an episode of syncope that occurred in May 2020 suggestiv of vasovagal episodes, no recurrence.    Coronary Angiography on 01/16/18 that showed no obstructive CAD and patent stents from 2009. Patient is not on statin due to intolerance, she is enrolled in esperion trial.   Patient presents for 16-month follow-up.  Last office visit patient was stable from a cardiovascular standpoint, therefore no changes were made.  On recent carotid artery duplex right ICA stenosis greater than 70%, no longer demonstrated we will therefore plan to follow-up with annual surveillance.  Patient remains essentially asymptomatic without recurrence of chest pain.  She does continue to have mild dyspnea on exertion which is chronic and stable.  Blood pressure is well controlled.  I personally reviewed external labs, lipids are uncontrolled.  Patient was previously on pravastatin, however it is now unclear whether she is taking this or not.  We will continue Zetia.  Patient will check her medications when she gets home and notify our  office whether or not she is taking pravastatin.  Further recommendations regarding hyperlipidemia to come pending medication review.   Physical exam and EKG remain unchanged.  Continue amlodipine, carvedilol, aspirin, Lasix.  Follow-up in 6 months, sooner if needed.   Alethia Berthold, PA-C 04/13/2021, 11:54 AM Office: [phone]

## 2021-04-13 ENCOUNTER — Other Ambulatory Visit: Payer: Self-pay

## 2021-04-13 ENCOUNTER — Ambulatory Visit: Payer: Medicare Other | Admitting: Student

## 2021-04-13 ENCOUNTER — Encounter: Payer: Self-pay | Admitting: Student

## 2021-04-13 VITALS — BP 131/71 | HR 65 | Temp 97.9°F | Resp 17 | Ht 62.0 in | Wt 219.2 lb

## 2021-04-13 DIAGNOSIS — I25118 Atherosclerotic heart disease of native coronary artery with other forms of angina pectoris: Secondary | ICD-10-CM | POA: Diagnosis not present

## 2021-04-13 DIAGNOSIS — I10 Essential (primary) hypertension: Secondary | ICD-10-CM

## 2021-04-13 DIAGNOSIS — I6523 Occlusion and stenosis of bilateral carotid arteries: Secondary | ICD-10-CM

## 2021-04-13 NOTE — H&P (Signed)
Surgical History & Physical  Patient Name: Kathryn [name] DOB: 09/23/1940  Surgery: Cataract extraction with intraocular lens implant phacoemulsification; Right Eye  Surgeon: Baruch Goldmann MD Surgery Date:  04-16-21 Pre-Op Date:  04-08-21  HPI: A 28 Yr. old female patient is returning after cataract post-op. The left eye is affected. Status post cataract post-op, which began 1 weeks ago: Since the last visit, the affected area is doing well. The patient's vision is improved. The patient states that vision OS is brighter and more vibrant since the surgery. Patient is following medication instructions, the patient is using the combination post op drops TID OS. The patient experiences no eye pain and no flashes, floater, shadow, curtain or veil. The patient also presents for pre op OD. The patient is still negatively affected by the level of vision in that eye that she is still not driving at night due to difficulty with glare. The patient states she is still unable to read fine print clearly, and when watching television, she cannot see the captions on the television. This is negatively affecting the patient's quality of life and the patient is unable to function adequately in life with the current level of vision. HPI was performed by Baruch Goldmann .  Medical History: Dry Eyes Cataracts Subconjunctival hemorrhage OD 2009 Diabetes High Blood Pressure LDL  Review of Systems Negative Allergic/Immunologic Negative Cardiovascular Negative Constitutional Negative Ear, Nose, Mouth & Throat Negative Endocrine Negative Eyes Negative Gastrointestinal Negative Genitourinary Negative Hemotologic/Lymphatic Negative Integumentary Negative Musculoskeletal Negative Neurological Negative Psychiatry Negative Respiratory  Social   Former smoker   Medication Prednisolone-Moxifloxacin-Bromfenac, Alprazolam, Carvedilol, Cetirizine, Valsartan, HCTZ, Pregabalin, Gabapentin, Ezetimibe, Furosemide,  Amlodipine,   Sx/Procedures Phaco c IOL OS with Dextenza, Gallbladder Removal, Hernia Sx, Hysterectomy,   Drug Allergies  Codeine,   History & Physical: Heent: Cataract, Right Eye NECK: supple without bruits LUNGS: lungs clear to auscultation CV: regular rate and rhythm Abdomen: soft and non-tender  Impression & Plan: Assessment: 1.  CATARACT EXTRACTION STATUS; Left Eye (Z98.42) 2.  COMBINED FORMS AGE RELATED CATARACT; Both Eyes (H25.813)  Plan: 1.  1 week after cataract surgery. Doing well with improved vision and normal eye pressure. Call with any problems or concerns. Stop drops - Dextenza.  2.  Dilates well - shugarcaine by protocol. Consider Toric Lens - Eyehance or Vivity. Cataract accounts for the patient's decreased vision. This visual impairment is not correctable with a tolerable change in glasses or contact lenses. Cataract surgery with an implantation of a new lens should significantly improve the visual and functional status of the patient. Discussed all risks, benefits, alternatives, and potential complications. Discussed the procedures and recovery. Patient desires to have surgery. A-scan ordered and performed today for intra-ocular lens calculations. The surgery will be performed in order to improve vision for driving, reading, and for eye examinations. Recommend phacoemulsification with intra-ocular lens. Recommend Dextenza for post-operative pain and inflammation. Right Eye. Surgery required to correct imbalance of vision.

## 2021-04-14 ENCOUNTER — Telehealth: Payer: Medicare Other

## 2021-04-14 ENCOUNTER — Ambulatory Visit (INDEPENDENT_AMBULATORY_CARE_PROVIDER_SITE_OTHER): Payer: Medicare Other

## 2021-04-14 ENCOUNTER — Telehealth: Payer: Self-pay

## 2021-04-14 DIAGNOSIS — M4807 Spinal stenosis, lumbosacral region: Secondary | ICD-10-CM

## 2021-04-14 DIAGNOSIS — I214 Non-ST elevation (NSTEMI) myocardial infarction: Secondary | ICD-10-CM

## 2021-04-14 DIAGNOSIS — N1832 Chronic kidney disease, stage 3b: Secondary | ICD-10-CM

## 2021-04-14 DIAGNOSIS — I129 Hypertensive chronic kidney disease with stage 1 through stage 4 chronic kidney disease, or unspecified chronic kidney disease: Secondary | ICD-10-CM

## 2021-04-14 DIAGNOSIS — R7303 Prediabetes: Secondary | ICD-10-CM

## 2021-04-14 DIAGNOSIS — I10 Essential (primary) hypertension: Secondary | ICD-10-CM

## 2021-04-14 NOTE — Telephone Encounter (Signed)
Pt called and stated that she has been taking a statin medication for her cholesterol. I do not see this on her list and she is requesting a refill. It looks as though she was on Pravastatin but it says it was discontinued 03/16/2021. Please advise.  ?

## 2021-04-15 ENCOUNTER — Other Ambulatory Visit: Payer: Self-pay

## 2021-04-15 MED ORDER — PRAVASTATIN SODIUM 40 MG PO TABS: 40.0000 mg | ORAL_TABLET | Freq: Every evening | ORAL | 3 refills | Status: DC

## 2021-04-15 NOTE — Patient Instructions (Signed)
Visit Information ? ?Thank you for taking time to visit with me today. Please don't hesitate to contact me if I can be of assistance to you before our next scheduled telephone appointment. ? ?Following are the goals we discussed today:  ?(Copy and paste patient goals from clinical care plan here) ? ?Our next appointment is by telephone on 04/21/21 at 9:30 AM ? ?Please call the care guide team at [phone] if you need to cancel or reschedule your appointment.  ? ?If you are experiencing a Mental Health or [place] or need someone to talk to, please call [phone]-TALK (toll free, 24 hour hotline)  ? ?The patient verbalized understanding of instructions, educational materials, and care plan provided today and agreed to receive a mailed copy of patient instructions, educational materials, and care plan.  ? ?Barb Merino, RN, BSN, CCM ?Care Management Coordinator ?[facility]  ?Direct Phone: [phone] ? ? ?

## 2021-04-15 NOTE — Telephone Encounter (Signed)
Sent on Pravastatin 40 mg daily to pts pharmacy.

## 2021-04-15 NOTE — Chronic Care Management (AMB) (Signed)
?Chronic Care Management  ? ?CCM RN Visit Note ? ?04/14/2021 ?Name: Kathryn [name] MRN: [id number] DOB: [birth year] ? ?Subjective: ?Kathryn [name] is a 81 y.o. year old female who is a primary care patient of Glendale Chard, MD. The care management team was consulted for assistance with disease management and care coordination needs.   ? ?Engaged with patient by telephone for follow up visit in response to provider referral for case management and/or care coordination services.  ? ?Consent to Services:  ?The patient was given information about Chronic Care Management services, agreed to services, and gave verbal consent prior to initiation of services.  Please see initial visit note for detailed documentation.  ? ?Patient agreed to services and verbal consent obtained.  ? ?Assessment: Review of patient past medical history, allergies, medications, health status, including review of consultants reports, laboratory and other test data, was performed as part of comprehensive evaluation and provision of chronic care management services.  ? ?SDOH (Social Determinants of Health) assessments and interventions performed:  Yes, no acute challenges  ? ?CCM Care Plan ? ?Allergies  ?Allergen Reactions  ? Codeine Hypertension  ? Shellfish Allergy   ? ? ?Outpatient Encounter Medications as of 04/14/2021  ?Medication Sig  ? acetaminophen (TYLENOL) 325 MG tablet Take 325 mg by mouth every 6 (six) hours as needed for mild pain.  ? ALPRAZolam (XANAX) 0.25 MG tablet TAKE (1) TABLET BY MOUTH TWICE DAILY AS NEEDED FOR ANXIETY.  ? amLODipine (NORVASC) 10 MG tablet TAKE ONE TABLET BY MOUTH ONCE DAILY.  ? Apoaequorin (PREVAGEN PO) Take 1 tablet by mouth daily.  ? Aspirin 81 MG CAPS   ? CALCIUM PO Take 1 tablet by mouth daily. 600 mg  ? carvedilol (COREG) 12.5 MG tablet TAKE (1) TABLET BY MOUTH TWICE DAILY.  ? cetirizine (ZYRTEC) 10 MG tablet Take 10 mg by mouth at bedtime.  ? diclofenac Sodium (VOLTAREN) 1 % GEL Apply 4 g topically 4  (four) times daily.  ? ezetimibe (ZETIA) 10 MG tablet Take 1 tablet (10 mg total) by mouth daily.  ? furosemide (LASIX) 20 MG tablet TAKE 1 TABLET BY MOUTH DAILY  ? gabapentin (NEURONTIN) 300 MG capsule Take 300 mg by mouth at bedtime.  ? Magnesium 250 MG TABS Take 1 tablet by mouth daily.  ? Multiple Vitamin (MULTIVITAMIN WITH MINERALS) TABS Take 1 tablet by mouth daily.  ? nitroGLYCERIN (NITROSTAT) 0.4 MG SL tablet Place 1 tablet (0.4 mg total) under the tongue every 5 (five) minutes x 3 doses as needed for chest pain.  ? pregabalin (LYRICA) 75 MG capsule TAKE 1 CAPSULE BY MOUTH THREE TIMES A DAY.  ? triamcinolone cream (KENALOG) 0.1 % Apply 1 application topically 2 (two) times daily.  ? valACYclovir (VALTREX) 1000 MG tablet Take 1 tablet (1,000 mg total) by mouth 3 (three) times daily.  ? ?No facility-administered encounter medications on file as of 04/14/2021.  ? ? ?Patient Active Problem List  ? Diagnosis Date Noted  ? Coronary artery disease of native artery of native heart with stable angina pectoris ([place]) 06/07/2018  ? Anxiety 01/18/2018  ? 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 Other abnormal glucose 01/15/2018  ? 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 NSTEMI (non-ST elevated myocardial infarction) ([place]) 01/14/2018  ? Essential hypertension 01/14/2018  ? Hyperlipidemia 01/14/2018  ? Carotid stenosis, asymptomatic, bilateral 09/06/2012  ? ? ?Conditions to be addressed/monitored: CKD III, Hypertensive nephropathy, prediabetes, NSTEMI, Essential HTN, Spinal stenosis of lumbosacral region ? ?  Care Plan : RN Care Manager Plan of Care  ?Updates made by Lynne Logan, RN since 04/14/2021 12:00 AM  ?  ? ?Problem: No Plan of Care established for mangaement of chronic disease states (CKD III, Hypertensive nephropathy, prediabetes, NSTEMI, Essential HTN, Spinal stenosis of lumbosacral region)   ?Priority: High  ?  ? ?Long-Range Goal: Establishment of plan of care for  management of chronic disease states (CKD III, Hypertensive nephropathy, prediabetes, NSTEMI, Essential HTN, Spinal stenosis of lumbosacral region)   ?Start Date: 04/14/2021  ?Expected End Date: 04/15/2022  ?This Visit's Progress: On track  ?Priority: High  ?Note:   ?Current Barriers:  ?Knowledge Deficits related to plan of care for management of CKD III, Hypertensive nephropathy, prediabetes, NSTEMI, Essential HTN, Spinal stenosis of lumbosacral region  ?Chronic Disease Management support and education needs related to CKD III, Hypertensive nephropathy, prediabetes, NSTEMI, Essential HTN, Spinal stenosis of lumbosacral region  ? ?RNCM Clinical Goal(s):  ?Patient will verbalize basic understanding of  CKD III, Hypertensive nephropathy, prediabetes, NSTEMI, Essential HTN, Spinal stenosis of lumbosacral region disease process and self health management plan as evidenced by patient will experience having no disease exacerbations related to her chronic disease states as listed above  ?take all medications exactly as prescribed and will call provider for medication related questions as evidenced by patient will demonstrate improved understanding of prescribed medications and rationale for usage as evidenced by patient teach back ?demonstrate Improved health management independence as evidenced by patient will report 100% adherence to her prescribed treatment plan  ?continue to work with RN Care Manager to address care management and care coordination needs related to  CKD III, Hypertensive nephropathy, prediabetes, NSTEMI, Essential HTN, Spinal stenosis of lumbosacral region as evidenced by adherence to CM Team Scheduled appointments ?demonstrate ongoing self health care management ability   as evidenced by    through collaboration with RN Care manager, provider, and care team.  ? ?Interventions: ?1:1 collaboration with primary care provider regarding development and update of comprehensive plan of care as evidenced by  provider attestation and co-signature ?Inter-disciplinary care team collaboration (see longitudinal plan of care) ?Evaluation of current treatment plan related to  self management and patient's adherence to plan as established by provider ? ?Cataract Surgery:  (Status:  New goal.)  Short Term Goal ?Evaluation of current treatment plan related to  Cataract Surgery , self-management and patient's adherence to plan as established by provider ?Completed successful outbound call with patient, patient would like to keep this call brief  ?Determined patient underwent left Cataract surgery with good results ?Discussed patient is scheduled to undergo right Cataract surgery on 04/16/21, determined patient verbalizes understanding of her procedure and denies having questions at this time, her family will provide transportation and be present with her following her surgery ?Discussed plans with patient for ongoing care management follow up and provided patient with direct contact information for care management team ? ?Patient Goals/Self-Care Activities: ?Take all medications as prescribed ?Attend all scheduled provider appointments ?Call pharmacy for medication refills 3-7 days in advance of running out of medications ?Perform all self care activities independently  ?Perform IADL's (shopping, preparing meals, housekeeping, managing finances) independently ?Call provider office for new concerns or questions  ? ?Follow Up Plan:  Telephone follow up appointment with care management team member scheduled for:  04/21/21   ?  ? ?Barb Merino, RN, BSN, CCM ?Care Management Coordinator ?[facility]  ?Direct Phone: [phone] ? ? ? ? ? ? ? ? ? ?

## 2021-04-15 NOTE — Telephone Encounter (Signed)
She was previously on pravastatin and should be on it as long as she tolerated it okay. We can send 40 mg daily

## 2021-04-16 ENCOUNTER — Ambulatory Visit (HOSPITAL_COMMUNITY)
Admission: RE | Admit: 2021-04-16 | Discharge: 2021-04-16 | Disposition: A | Discharge status: Home / Self Care | Payer: Medicare Other | Attending: Ophthalmology | Admitting: Ophthalmology

## 2021-04-16 ENCOUNTER — Ambulatory Visit (HOSPITAL_COMMUNITY): Payer: Medicare Other | Admitting: Anesthesiology

## 2021-04-16 ENCOUNTER — Ambulatory Visit (HOSPITAL_BASED_OUTPATIENT_CLINIC_OR_DEPARTMENT_OTHER): Payer: Medicare Other | Admitting: Anesthesiology

## 2021-04-16 ENCOUNTER — Encounter (HOSPITAL_COMMUNITY)
Admission: RE | Disposition: A | Discharge status: Home / Self Care | Payer: Self-pay | Source: Home / Self Care | Attending: Ophthalmology

## 2021-04-16 DIAGNOSIS — H5711 Ocular pain, right eye: Secondary | ICD-10-CM | POA: Insufficient documentation

## 2021-04-16 DIAGNOSIS — E1136 Type 2 diabetes mellitus with diabetic cataract: Secondary | ICD-10-CM | POA: Diagnosis not present

## 2021-04-16 DIAGNOSIS — I25119 Atherosclerotic heart disease of native coronary artery with unspecified angina pectoris: Secondary | ICD-10-CM

## 2021-04-16 DIAGNOSIS — I10 Essential (primary) hypertension: Secondary | ICD-10-CM

## 2021-04-16 DIAGNOSIS — Z87891 Personal history of nicotine dependence: Secondary | ICD-10-CM | POA: Insufficient documentation

## 2021-04-16 DIAGNOSIS — Z9842 Cataract extraction status, left eye: Secondary | ICD-10-CM | POA: Insufficient documentation

## 2021-04-16 DIAGNOSIS — H25811 Combined forms of age-related cataract, right eye: Secondary | ICD-10-CM | POA: Insufficient documentation

## 2021-04-16 SURGERY — CATARACT EXTRACTION PHACO AND INTRAOCULAR LENS PLACEMENT (IOC) with placement of Corticosteroid
Anesthesia: Monitor Anesthesia Care | Site: Eye | Laterality: Right

## 2021-04-16 MED ORDER — MIDAZOLAM HCL 2 MG/2ML IJ SOLN
INTRAMUSCULAR | Status: AC
  Filled 2021-04-16: qty 2

## 2021-04-16 MED ORDER — EPINEPHRINE PF 1 MG/ML IJ SOLN
INTRAMUSCULAR | Status: AC
  Filled 2021-04-16: qty 2

## 2021-04-16 MED ORDER — TETRACAINE HCL 0.5 % OP SOLN
1.0000 [drp] | OPHTHALMIC | Status: AC | PRN
  Administered 2021-04-16 (×3): 1 [drp] via OPHTHALMIC

## 2021-04-16 MED ORDER — DEXAMETHASONE 0.4 MG OP INST
VAGINAL_INSERT | OPHTHALMIC | Status: DC | PRN
  Administered 2021-04-16: 0.4 mg via OPHTHALMIC

## 2021-04-16 MED ORDER — MIDAZOLAM HCL 2 MG/2ML IJ SOLN
INTRAMUSCULAR | Status: DC | PRN
Start: 2021-04-16 — End: 2021-04-16
  Administered 2021-04-16: 1 mg via INTRAVENOUS

## 2021-04-16 MED ORDER — TROPICAMIDE 1 % OP SOLN
1.0000 [drp] | OPHTHALMIC | Status: AC | PRN
  Administered 2021-04-16 (×3): 1 [drp] via OPHTHALMIC
  Filled 2021-04-16: qty 2

## 2021-04-16 MED ORDER — LIDOCAINE HCL 3.5 % OP GEL
1.0000 "application " | Freq: Once | OPHTHALMIC | Status: AC
  Administered 2021-04-16: 1 via OPHTHALMIC

## 2021-04-16 MED ORDER — BSS IO SOLN
INTRAOCULAR | Status: DC | PRN
  Administered 2021-04-16: 15 mL via INTRAOCULAR

## 2021-04-16 MED ORDER — STERILE WATER FOR IRRIGATION IR SOLN
Status: DC | PRN
  Administered 2021-04-16: 250 mL

## 2021-04-16 MED ORDER — DEXAMETHASONE 0.4 MG OP INST
VAGINAL_INSERT | OPHTHALMIC | Status: AC
  Filled 2021-04-16: qty 1

## 2021-04-16 MED ORDER — POVIDONE-IODINE 5 % OP SOLN
OPHTHALMIC | Status: DC | PRN
  Administered 2021-04-16: 1 via OPHTHALMIC

## 2021-04-16 MED ORDER — ORAL CARE MOUTH RINSE: 15.0000 mL | Freq: Once | OROMUCOSAL | Status: DC

## 2021-04-16 MED ORDER — SODIUM CHLORIDE 0.9% FLUSH
INTRAVENOUS | Status: DC | PRN
  Administered 2021-04-16: 5 mL via INTRAVENOUS

## 2021-04-16 MED ORDER — CHLORHEXIDINE GLUCONATE 0.12 % MT SOLN: 15.0000 mL | Freq: Once | OROMUCOSAL | Status: DC

## 2021-04-16 MED ORDER — PHENYLEPHRINE HCL 2.5 % OP SOLN
1.0000 [drp] | OPHTHALMIC | Status: AC | PRN
  Administered 2021-04-16 (×3): 1 [drp] via OPHTHALMIC

## 2021-04-16 MED ORDER — SODIUM HYALURONATE 10 MG/ML IO SOLUTION
PREFILLED_SYRINGE | INTRAOCULAR | Status: DC | PRN
  Administered 2021-04-16: 0.85 mL via INTRAOCULAR

## 2021-04-16 MED ORDER — SODIUM HYALURONATE 23MG/ML IO SOSY
PREFILLED_SYRINGE | INTRAOCULAR | Status: DC | PRN
  Administered 2021-04-16: 0.6 mL via INTRAOCULAR

## 2021-04-16 MED ORDER — EPINEPHRINE PF 1 MG/ML IJ SOLN
INTRAOCULAR | Status: DC | PRN
  Administered 2021-04-16: 1 mL via OPHTHALMIC

## 2021-04-16 MED ORDER — EPINEPHRINE PF 1 MG/ML IJ SOLN
INTRAOCULAR | Status: DC | PRN
  Administered 2021-04-16: 500 mL

## 2021-04-16 SURGICAL SUPPLY — 18 items
CATARACT SUITE SIGHTPATH (MISCELLANEOUS) ×2 IMPLANT
CLOTH BEACON ORANGE TIMEOUT ST (SAFETY) ×2 IMPLANT
EYE SHIELD UNIVERSAL CLEAR (GAUZE/BANDAGES/DRESSINGS) ×1 IMPLANT
FEE CATARACT SUITE SIGHTPATH (MISCELLANEOUS) ×1 IMPLANT
GLOVE SURG UNDER POLY LF SZ6.5 (GLOVE) ×2 IMPLANT
GLOVE SURG UNDER POLY LF SZ7 (GLOVE) ×1 IMPLANT
GOWN STRL REUS W/TWL LRG LVL3 (GOWN DISPOSABLE) ×1 IMPLANT
LENS IOL RAYNER 18.0 (Intraocular Lens) ×2 IMPLANT
LENS IOL RAYONE EMV 18.0 (Intraocular Lens) IMPLANT
NDL HYPO 18GX1.5 BLUNT FILL (NEEDLE) ×1 IMPLANT
NEEDLE HYPO 18GX1.5 BLUNT FILL (NEEDLE) ×2 IMPLANT
PAD ARMBOARD 7.5X6 YLW CONV (MISCELLANEOUS) ×2 IMPLANT
RING MALYGIN (MISCELLANEOUS) IMPLANT
RING MALYGIN 7.0 (MISCELLANEOUS) IMPLANT
SYR TB 1ML LL NO SAFETY (SYRINGE) ×2 IMPLANT
TAPE SURG TRANSPORE 1 IN (GAUZE/BANDAGES/DRESSINGS) IMPLANT
TAPE SURGICAL TRANSPORE 1 IN (GAUZE/BANDAGES/DRESSINGS) ×2
WATER STERILE IRR 250ML POUR (IV SOLUTION) ×2 IMPLANT

## 2021-04-16 NOTE — Anesthesia Procedure Notes (Signed)
Procedure Name: [place] ?Date/Time: 04/16/2021 10:48 AM ?Performed by: Orlie Dakin, CRNA ?Pre-anesthesia Checklist: Patient identified, Emergency Drugs available, Suction available and Patient being monitored ?Patient Re-evaluated:Patient Re-evaluated prior to induction ?Oxygen Delivery Method: Nasal cannula ?Placement Confirmation: positive ETCO2 ? ? ? ? ?

## 2021-04-16 NOTE — Interval H&P Note (Signed)
History and Physical Interval Note: ? ?04/16/2021 ?10:39 AM ? ?Kathryn [name]  has presented today for surgery, with the diagnosis of combined forms age related cataract; right.  The various methods of treatment have been discussed with the patient and family. After consideration of risks, benefits and other options for treatment, the patient has consented to  Procedure(s) with comments: ?CATARACT EXTRACTION PHACO AND INTRAOCULAR LENS PLACEMENT (IOC) with placement of Corticosteroid (Right) - right as a surgical intervention.  The patient's history has been reviewed, patient examined, no change in status, stable for surgery.  I have reviewed the patient's chart and labs.  Questions were answered to the patient's satisfaction.   ? ? ?Baruch Goldmann ? ? ?

## 2021-04-16 NOTE — Discharge Instructions (Signed)
Please discharge patient when stable, will follow up today with Dr. Davonne Jarnigan at the [facility] [place] office immediately following discharge.  Leave shield in place until visit.  All paperwork with discharge instructions will be given at the office.  [facility] Address:  [address]  [place], [place] [address]  

## 2021-04-16 NOTE — Anesthesia Preprocedure Evaluation (Signed)
Anesthesia Evaluation  ?Patient identified by MRN, date of birth, ID band ?Patient awake ? ? ? ?Reviewed: ?Allergy & Precautions, NPO status , Patient's Chart, lab work & pertinent test results, reviewed documented beta blocker date and time  ? ?Airway ?Mallampati: III ? ?TM Distance: >3 FB ?Neck ROM: Full ? ? ? Dental ? ?(+) Dental Advisory Given, Missing ?  ?Pulmonary ?former smoker,  ?  ?Pulmonary exam normal ?breath sounds clear to auscultation ? ? ? ? ? ? Cardiovascular ?Exercise Tolerance: Good ?hypertension, Pt. on home beta blockers and Pt. on medications ?+ angina + CAD, + Past MI, + Cardiac Stents and + Peripheral Vascular Disease  ?Normal cardiovascular exam ?Rhythm:Regular Rate:Normal ? ? ?  ?Neuro/Psych ? Headaches, PSYCHIATRIC DISORDERS Anxiety   ? GI/Hepatic ?negative GI ROS, Neg liver ROS,   ?Endo/Other  ?negative endocrine ROS ? Renal/GU ?Renal InsufficiencyRenal disease  ?negative genitourinary ?  ?Musculoskeletal ? ?(+) Arthritis , Osteoarthritis,   ? Abdominal ?  ?Peds ?negative pediatric ROS ?(+)  Hematology ?negative hematology ROS ?(+)   ?Anesthesia Other Findings ?Chronic neck pain and back pain ? Reproductive/Obstetrics ?negative OB ROS ? ?  ? ? ? ? ? ? ? ? ? ? ? ? ? ?  ?  ? ? ? ? ? ? ? ?Anesthesia Physical ? ?Anesthesia Plan ? ?ASA: 3 ? ?Anesthesia Plan: MAC  ? ?Post-op Pain Management: Minimal or no pain anticipated  ? ?Induction:  ? ?PONV Risk Score and Plan:  ? ?Airway Management Planned: Nasal Cannula and Natural Airway ? ?Additional Equipment:  ? ?Intra-op Plan:  ? ?Post-operative Plan:  ? ?Informed Consent: I have reviewed the patients History and Physical, chart, labs and discussed the procedure including the risks, benefits and alternatives for the proposed anesthesia with the patient or authorized representative who has indicated his/her understanding and acceptance.  ? ? ? ?Dental advisory given ? ?Plan Discussed with: CRNA and  Surgeon ? ?Anesthesia Plan Comments:   ? ? ? ? ? ? ?Anesthesia Quick Evaluation ? ?

## 2021-04-16 NOTE — Anesthesia Postprocedure Evaluation (Signed)
Anesthesia Post Note ? ?Patient: Kathryn [name] ? ?Procedure(s) Performed: CATARACT EXTRACTION PHACO AND INTRAOCULAR LENS PLACEMENT (IOC) with placement of Corticosteroid (Right: Eye) ? ?Patient location during evaluation: Phase II ?Anesthesia Type: MAC ?Level of consciousness: awake and alert and oriented ?Pain management: pain level controlled ?Vital Signs Assessment: post-procedure vital signs reviewed and stable ?Respiratory status: spontaneous breathing, nonlabored ventilation and respiratory function stable ?Cardiovascular status: stable and blood pressure returned to baseline ?Postop Assessment: no apparent nausea or vomiting ?Anesthetic complications: no ? ? ?No notable events documented. ? ? ?Last Vitals:  ?Vitals:  ? 04/16/21 1030 04/16/21 1108  ?BP: 135/60 (!) 150/55  ?Pulse: (!) 54 (!) 55  ?Resp: 11 14  ?Temp:  36.7 ?C  ?SpO2: 100% 98%  ?  ?Last Pain:  ?Vitals:  ? 04/16/21 1108  ?TempSrc: Oral  ?PainSc: 0-No pain  ? ? ?  ?  ?  ?  ?  ?  ? ?Caitlan Chauca C Brandn Mcgath ? ? ? ? ?

## 2021-04-16 NOTE — Op Note (Signed)
Date of procedure: 04/16/21 ? ?Pre-operative diagnosis:  Visually significant combined form age-related cataract, Right Eye (H25.811) ? ?Post-operative diagnosis:   ?1. Visually significant combined form age-related cataract, Right Eye (H25.811) ?2. Pain and inflammation following cataract surgery Right Eye (H57.11) ? ?Procedure:  ?Removal of cataract via phacoemulsification and insertion of intra-ocular lens Rayner [id number] +18.0D into the capsular bag of the Right Eye ?2. Placement of Dextenza insert, Right Eye ? ?Attending surgeon: Gerda Diss. Marisa Hua, MD, MA ? ?Anesthesia: MAC, Topical [place] ? ?Complications: None ? ?Estimated Blood Loss: <43m (minimal) ? ?Specimens: None ? ?Implants: As above ? ?Indications:  Visually significant age-related cataract, Right Eye ? ?Procedure:  ?The patient was seen and identified in the pre-operative area. The operative eye was identified and dilated.  The operative eye was marked.  Topical anesthesia was administered to the operative eye.    ? ?The patient was then to the operative suite and placed in the supine position.  A timeout was performed confirming the patient, procedure to be performed, and all other relevant information.   The patient's face was prepped and draped in the usual fashion for intra-ocular surgery.  A lid speculum was placed into the operative eye and the surgical microscope moved into place and focused.  A superotemporal paracentesis was created using a 20 gauge paracentesis blade.  Shugarcaine was injected into the anterior chamber.  Viscoelastic was injected into the anterior chamber.  A temporal clear-corneal main wound incision was created using a 2.464mmicrokeratome.  A continuous curvilinear capsulorrhexis was initiated using an irrigating cystitome and completed using capsulorrhexis forceps.  Hydrodissection and hydrodeliniation were performed.  Viscoelastic was injected into the anterior chamber.  A phacoemulsification handpiece and a chopper as a  second instrument were used to remove the nucleus and epinucleus. The irrigation/aspiration handpiece was used to remove any remaining cortical material.  ? ?The capsular bag was reinflated with viscoelastic, checked, and found to be intact.  The intraocular lens was inserted into the capsular bag.  The irrigation/aspiration handpiece was used to remove any remaining viscoelastic.  The clear corneal wound and paracentesis wounds were then hydrated and checked with Weck-Cels to be watertight.   ? ?The lid-speculum was removed. The lower punctum was dilated. A Dextenza implant was placed in the lower canaliculus without complication. ? ?The drape was removed.  The patient's face was cleaned with a wet and dry 4x4. A clear shield was taped over the eye. The patient was taken to the post-operative care unit in good condition, having tolerated the procedure well. ? ?[facility]-Op Instructions: [facility] and will receive all other orders and instructions. ? ?

## 2021-04-16 NOTE — Transfer of Care (Signed)
Immediate Anesthesia Transfer of Care Note ? ?Patient: Kathryn [name] ? ?Procedure(s) Performed: CATARACT EXTRACTION PHACO AND INTRAOCULAR LENS PLACEMENT (IOC) with placement of Corticosteroid (Right: Eye) ? ?Patient Location: Short Stay ? ?Anesthesia Type:MAC ? ?Level of Consciousness: awake, alert  and oriented ? ?Airway & Oxygen Therapy: Patient Spontanous Breathing ? ?Post-op Assessment: Report given to RN and Post -op Vital signs reviewed and stable ? ?Post vital signs: Reviewed and stable ? ?Last Vitals:  ?Vitals Value Taken Time  ?BP    ?Temp    ?Pulse    ?Resp    ?SpO2    ? ? ?Last Pain:  ?Vitals:  ? 04/16/21 1015  ?PainSc: 0-No pain  ?   ? ?  ? ?Complications: No notable events documented. ?

## 2021-04-19 ENCOUNTER — Telehealth: Payer: Self-pay

## 2021-04-19 NOTE — Telephone Encounter (Signed)
Called pt to see how she was doing, she states she is doing pretty good. She reports having body aches, pt is taking extra strength tylenol as needed.  ?

## 2021-04-21 ENCOUNTER — Telehealth: Payer: Medicare Other

## 2021-04-21 ENCOUNTER — Ambulatory Visit: Payer: Self-pay

## 2021-04-21 DIAGNOSIS — I214 Non-ST elevation (NSTEMI) myocardial infarction: Secondary | ICD-10-CM

## 2021-04-21 DIAGNOSIS — N1832 Chronic kidney disease, stage 3b: Secondary | ICD-10-CM

## 2021-04-21 DIAGNOSIS — I10 Essential (primary) hypertension: Secondary | ICD-10-CM

## 2021-04-21 DIAGNOSIS — I129 Hypertensive chronic kidney disease with stage 1 through stage 4 chronic kidney disease, or unspecified chronic kidney disease: Secondary | ICD-10-CM

## 2021-04-21 DIAGNOSIS — M4807 Spinal stenosis, lumbosacral region: Secondary | ICD-10-CM

## 2021-04-21 DIAGNOSIS — R7303 Prediabetes: Secondary | ICD-10-CM

## 2021-04-22 ENCOUNTER — Ambulatory Visit (HOSPITAL_COMMUNITY): Payer: Medicare Other | Admitting: Physical Therapy

## 2021-04-22 NOTE — Patient Instructions (Signed)
Visit Information ? ?Thank you for taking time to visit with me today. Please don't hesitate to contact me if I can be of assistance to you before our next scheduled telephone appointment. ? ?Following are the goals we discussed today:  ?(Copy and paste patient goals from clinical care plan here) ? ?Our next appointment is by telephone on 06/04/21 at 12:45 PM ? ?Please call the care guide team at [phone] if you need to cancel or reschedule your appointment.  ? ?If you are experiencing a Mental Health or [place] or need someone to talk to, please call [phone]-TALK (toll free, 24 hour hotline)  ? ?The patient verbalized understanding of instructions, educational materials, and care plan provided today and agreed to receive a mailed copy of patient instructions, educational materials, and care plan.  ? ?Barb Merino, RN, BSN, CCM ?Care Management Coordinator ?[facility]  ?Direct Phone: [phone] ? ? ?

## 2021-04-22 NOTE — Chronic Care Management (AMB) (Signed)
Chronic Care Management   CCM RN Visit Note  04/21/2021 Name: Kathryn [name] MRN: [id number] DOB: [birth year]  Subjective: Kathryn [name] is a 81 y.o. year old female who is a primary care patient of Glendale Chard, MD. The care management team was consulted for assistance with disease management and care coordination needs.    Engaged with patient by telephone for follow up visit in response to provider referral for case management and/or care coordination services.   Consent to Services:  The patient was given information about Chronic Care Management services, agreed to services, and gave verbal consent prior to initiation of services.  Please see initial visit note for detailed documentation.   Patient agreed to services and verbal consent obtained.   Assessment: Review of patient past medical history, allergies, medications, health status, including review of consultants reports, laboratory and other test data, was performed as part of comprehensive evaluation and provision of chronic care management services.   SDOH (Social Determinants of Health) assessments and interventions performed:  Yes, no acute needs   CCM Care Plan  Allergies  Allergen Reactions   Codeine Hypertension   Shellfish Allergy     Outpatient Encounter Medications as of 04/21/2021  Medication Sig   acetaminophen (TYLENOL) 325 MG tablet Take 325 mg by mouth every 6 (six) hours as needed for mild pain.   ALPRAZolam (XANAX) 0.25 MG tablet TAKE (1) TABLET BY MOUTH TWICE DAILY AS NEEDED FOR ANXIETY.   amLODipine (NORVASC) 10 MG tablet TAKE ONE TABLET BY MOUTH ONCE DAILY.   Apoaequorin (PREVAGEN PO) Take 1 tablet by mouth daily.   Aspirin 81 MG CAPS    CALCIUM PO Take 1 tablet by mouth daily. 600 mg   carvedilol (COREG) 12.5 MG tablet TAKE (1) TABLET BY MOUTH TWICE DAILY.   cetirizine (ZYRTEC) 10 MG tablet Take 10 mg by mouth at bedtime.   diclofenac Sodium (VOLTAREN) 1 % GEL Apply 4 g topically 4 (four)  times daily.   furosemide (LASIX) 20 MG tablet TAKE 1 TABLET BY MOUTH DAILY   gabapentin (NEURONTIN) 300 MG capsule Take 300 mg by mouth at bedtime.   Magnesium 250 MG TABS Take 1 tablet by mouth daily.   Multiple Vitamin (MULTIVITAMIN WITH MINERALS) TABS Take 1 tablet by mouth daily.   nitroGLYCERIN (NITROSTAT) 0.4 MG SL tablet Place 1 tablet (0.4 mg total) under the tongue every 5 (five) minutes x 3 doses as needed for chest pain.   pravastatin (PRAVACHOL) 40 MG tablet Take 1 tablet (40 mg total) by mouth every evening.   pregabalin (LYRICA) 75 MG capsule TAKE 1 CAPSULE BY MOUTH THREE TIMES A DAY.   triamcinolone cream (KENALOG) 0.1 % Apply 1 application topically 2 (two) times daily.   valACYclovir (VALTREX) 1000 MG tablet Take 1 tablet (1,000 mg total) by mouth 3 (three) times daily.   No facility-administered encounter medications on file as of 04/21/2021.    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   Chronic renal disease, 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   Other abnormal glucose 01/15/2018   Neuralgia, 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   NSTEMI (non-ST elevated myocardial infarction) ([place]) 01/14/2018   Essential hypertension 01/14/2018   Hyperlipidemia 01/14/2018   Carotid stenosis, asymptomatic, bilateral 09/06/2012    Conditions to be addressed/monitored: CKD III, Hypertensive nephropathy, prediabetes, NSTEMI, Essential HTN, Spinal stenosis of lumbosacral  region  Care Plan : RN Care Manager Plan of Care  Updates made by Lynne Logan, RN since 04/21/2021 12:00 AM     Problem: No Plan of Care established for mangaement of chronic disease states (CKD III, Hypertensive nephropathy, prediabetes, NSTEMI, Essential HTN, Spinal stenosis of lumbosacral region)   Priority: High     Long-Range Goal: Establishment of plan of care for  management of chronic disease states (CKD III, Hypertensive nephropathy, prediabetes, NSTEMI, Essential HTN, Spinal stenosis of lumbosacral region)   Start Date: 04/14/2021  Expected End Date: 04/15/2022  Recent Progress: On track  Priority: High  Note:   Current Barriers:  Knowledge Deficits related to plan of care for management of CKD III, Hypertensive nephropathy, prediabetes, NSTEMI, Essential HTN, Spinal stenosis of lumbosacral region  Chronic Disease Management support and education needs related to CKD III, Hypertensive nephropathy, prediabetes, NSTEMI, Essential HTN, Spinal stenosis of lumbosacral region   RNCM Clinical Goal(s):  Patient will verbalize basic understanding of  CKD III, Hypertensive nephropathy, prediabetes, NSTEMI, Essential HTN, Spinal stenosis of lumbosacral region disease process and self health management plan as evidenced by patient will experience having no disease exacerbations related to her chronic disease states as listed above  take all medications exactly as prescribed and will call provider for medication related questions as evidenced by patient will demonstrate improved understanding of prescribed medications and rationale for usage as evidenced by patient teach back demonstrate Improved health management independence as evidenced by patient will report 100% adherence to her prescribed treatment plan  continue to work with RN Care Manager to address care management and care coordination needs related to  CKD III, Hypertensive nephropathy, prediabetes, NSTEMI, Essential HTN, Spinal stenosis of lumbosacral region as evidenced by adherence to CM Team Scheduled appointments demonstrate ongoing self health care management ability   as evidenced by    through collaboration with RN Care manager, provider, and care team.   Interventions: 1:1 collaboration with primary care provider regarding development and update of comprehensive plan of care as evidenced by provider  attestation and co-signature Inter-disciplinary care team collaboration (see longitudinal plan of care) Evaluation of current treatment plan related to  self management and patient's adherence to plan as established by provider  Cataract Surgery:  (Status:  Goal Met.)  Short Term Goal Evaluation of current treatment plan related to  Cataract Surgery , self-management and patient's adherence to plan as established by provider Completed successful outbound call with patient Determined patient underwent right Cataract surgery with good results Determined patient is adhering to her prescribed eye drops as directed with good results, reports no pain and feels visual acuity has improved Reviewed scheduled/upcoming provider appointments including: post Cataract follow up  Discussed plans with patient for ongoing care management follow up and provided patient with direct contact information for care management team  Hypertension Interventions:  (Status:  Goal on track:  Yes.) Long Term Goal Last practice recorded BP readings:  BP Readings from Last 3 Encounters:  04/16/21 (!) 150/55  04/13/21 131/71  04/02/21 131/70  Most recent eGFR/CrCl:  Lab Results  Component Value Date   EGFR 42 (L) 03/16/2021    No components found for: CRCL Counseled on the importance of exercise goals with target of 150 minutes per week Advised patient, providing education and rationale, to monitor blood pressure daily and record, calling PCP for findings outside established parameters Provided education on prescribed diet low Sodium Discussed complications of poorly controlled blood pressure such as heart disease, stroke,  circulatory complications, vision complications, kidney impairment, sexual dysfunction    Chronic Kidney Disease Interventions:  (Status:  Goal on track:  Yes.) Long Term Goal Assessed the Patient understanding of chronic kidney disease    Evaluation of current treatment plan related to chronic  kidney disease self management and patient's adherence to plan as established by provider      Reviewed prescribed diet increase water to 48-64 oz daily unless otherwise directed  Reviewed medications with patient and discussed importance of compliance    Provided education on kidney disease progression    Last practice recorded BP readings:  BP Readings from Last 3 Encounters:  04/16/21 (!) 150/55  04/13/21 131/71  04/02/21 131/70  Most recent eGFR/CrCl:  Lab Results  Component Value Date   EGFR 42 (L) 03/16/2021    No components found for: CRCL   Pain Interventions:  (Status:  New goal.) Long Term Goal Pain assessment performed Medications reviewed Reviewed provider established plan for pain management Discussed importance of adherence to all scheduled medical appointments Counseled on the importance of reporting any/all new or changed pain symptoms or management strategies to pain management provider Advised patient to report to care team affect of pain on daily activities Discussed use of relaxation techniques and/or diversional activities to assist with pain reduction (distraction, imagery, relaxation, massage, acupressure, TENS, heat, and cold application Reviewed with patient prescribed pharmacological and nonpharmacological pain relief strategies Advised patient to discuss persistent or worsening symptoms with provider Assessed social determinant of health barriers Reviewed scheduled/upcoming provider appointments including: Ortho evaluation for PT scheduled for 04/27/21 $RemoveBef'@1'CHdwzmJGst$ :88 PM  Mailed printed educational materials related to Chair Exercises  Hyperlipidemia Interventions:  (Status:  New goal.) Long Term Goal Medication review performed; medication list updated in electronic medical record.  Provider established cholesterol goals reviewed Counseled on importance of regular laboratory monitoring as prescribed Provided HLD educational materials Reviewed role and benefits of  statin for ASCVD risk reduction Reviewed importance of limiting foods high in cholesterol Reviewed exercise goals and target of 150 minutes per week Mailed printed educational materials related to Cholesterol Management  Lipid Panel     Component Value Date/Time   CHOL 206 (H) 02/16/2021 1505   TRIG 86 02/16/2021 1505   HDL 65 02/16/2021 1505   CHOLHDL 3.0 02/13/2020 1602   LDLCALC 126 (H) 02/16/2021 1505   LABVLDL 15 02/16/2021 1505      Patient Goals/Self-Care Activities: Take all medications as prescribed Attend all scheduled provider appointments Call pharmacy for medication refills 3-7 days in advance of running out of medications Perform all self care activities independently  Perform IADL's (shopping, preparing meals, housekeeping, managing finances) independently Call provider office for new concerns or questions  keep appointment with eye doctor check feet daily for cuts, sores or redness drink 6 to 8 glasses of water each day manage portion size check blood pressure 3 times per week call doctor for signs and symptoms of high blood pressure take medications for blood pressure exactly as prescribed begin an exercise program report new symptoms to your doctor  Follow Up Plan:  Telephone follow up appointment with care management team member scheduled for:  06/04/21      Barb Merino, RN, BSN, CCM Care Management Coordinator [facility]  Direct Phone: [phone]

## 2021-04-27 ENCOUNTER — Other Ambulatory Visit: Payer: Self-pay

## 2021-04-27 ENCOUNTER — Ambulatory Visit (HOSPITAL_COMMUNITY): Payer: Medicare Other | Attending: Internal Medicine

## 2021-04-27 DIAGNOSIS — M5416 Radiculopathy, lumbar region: Secondary | ICD-10-CM | POA: Diagnosis not present

## 2021-04-27 DIAGNOSIS — M5441 Lumbago with sciatica, right side: Secondary | ICD-10-CM | POA: Diagnosis present

## 2021-04-27 DIAGNOSIS — M5431 Sciatica, right side: Secondary | ICD-10-CM | POA: Insufficient documentation

## 2021-04-27 DIAGNOSIS — G8929 Other chronic pain: Secondary | ICD-10-CM | POA: Diagnosis present

## 2021-04-27 DIAGNOSIS — M5442 Lumbago with sciatica, left side: Secondary | ICD-10-CM | POA: Insufficient documentation

## 2021-04-27 DIAGNOSIS — R262 Difficulty in walking, not elsewhere classified: Secondary | ICD-10-CM | POA: Insufficient documentation

## 2021-04-27 DIAGNOSIS — M5432 Sciatica, left side: Secondary | ICD-10-CM | POA: Diagnosis present
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
## 2021-05-04 ENCOUNTER — Ambulatory Visit (HOSPITAL_COMMUNITY): Payer: Medicare Other | Admitting: Physical Therapy

## 2021-05-04 ENCOUNTER — Other Ambulatory Visit: Payer: Self-pay

## 2021-05-04 DIAGNOSIS — G8929 Other chronic pain: Secondary | ICD-10-CM

## 2021-05-04 DIAGNOSIS — M5431 Sciatica, right side: Secondary | ICD-10-CM

## 2021-05-04 DIAGNOSIS — R262 Difficulty in walking, not elsewhere classified: Secondary | ICD-10-CM
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
## 2021-05-05 ENCOUNTER — Encounter (HOSPITAL_COMMUNITY): Payer: Medicare Other

## 2021-05-06 ENCOUNTER — Encounter (HOSPITAL_COMMUNITY): Payer: Medicare Other

## 2021-05-09 ENCOUNTER — Other Ambulatory Visit: Payer: Self-pay | Admitting: Student

## 2021-05-12 ENCOUNTER — Encounter (HOSPITAL_COMMUNITY): Payer: Self-pay

## 2021-05-12 ENCOUNTER — Ambulatory Visit (HOSPITAL_COMMUNITY): Payer: Medicare Other

## 2021-05-12 DIAGNOSIS — G8929 Other chronic pain: Secondary | ICD-10-CM

## 2021-05-12 DIAGNOSIS — R262 Difficulty in walking, not elsewhere classified: Secondary | ICD-10-CM

## 2021-05-12 DIAGNOSIS — M5431 Sciatica, right side: Secondary | ICD-10-CM | POA: Diagnosis not present
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
## 2021-05-14 ENCOUNTER — Ambulatory Visit (HOSPITAL_COMMUNITY): Payer: Medicare Other | Admitting: Physical Therapy

## 2021-05-14 DIAGNOSIS — E785 Hyperlipidemia, unspecified: Secondary | ICD-10-CM

## 2021-05-14 DIAGNOSIS — N1832 Chronic kidney disease, stage 3b: Secondary | ICD-10-CM | POA: Diagnosis not present

## 2021-05-14 DIAGNOSIS — I129 Hypertensive chronic kidney disease with stage 1 through stage 4 chronic kidney disease, or unspecified chronic kidney disease: Secondary | ICD-10-CM

## 2021-05-14 DIAGNOSIS — M5432 Sciatica, left side: Secondary | ICD-10-CM

## 2021-05-14 DIAGNOSIS — G8929 Other chronic pain: Secondary | ICD-10-CM

## 2021-05-14 DIAGNOSIS — R262 Difficulty in walking, not elsewhere classified: Secondary | ICD-10-CM

## 2021-05-14 DIAGNOSIS — M5431 Sciatica, right side: Secondary | ICD-10-CM | POA: Diagnosis not present
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
## 2021-05-17 ENCOUNTER — Other Ambulatory Visit: Payer: Self-pay | Admitting: Cardiology

## 2021-05-17 ENCOUNTER — Other Ambulatory Visit: Payer: Self-pay | Admitting: Internal Medicine

## 2021-05-17 DIAGNOSIS — I10 Essential (primary) hypertension: Secondary | ICD-10-CM

## 2021-05-18 ENCOUNTER — Telehealth (HOSPITAL_COMMUNITY): Payer: Self-pay

## 2021-05-18 ENCOUNTER — Encounter (HOSPITAL_COMMUNITY): Payer: Medicare Other

## 2021-05-19 ENCOUNTER — Ambulatory Visit (INDEPENDENT_AMBULATORY_CARE_PROVIDER_SITE_OTHER): Payer: Medicare Other | Admitting: Nurse Practitioner

## 2021-05-19 ENCOUNTER — Encounter: Payer: Self-pay | Admitting: Nurse Practitioner

## 2021-05-19 VITALS — BP 130/70 | HR 75 | Temp 98.2°F | Ht 62.0 in | Wt 220.0 lb

## 2021-05-19 DIAGNOSIS — R21 Rash and other nonspecific skin eruption: Secondary | ICD-10-CM

## 2021-05-19 DIAGNOSIS — Z6841 Body Mass Index (BMI) 40.0 and over, adult: Secondary | ICD-10-CM | POA: Diagnosis not present

## 2021-05-19 MED ORDER — PREDNISONE 10 MG PO TABS: ORAL_TABLET | ORAL | 0 refills | Status: DC

## 2021-05-19 MED ORDER — VALACYCLOVIR HCL 1 G PO TABS: 1000.0000 mg | ORAL_TABLET | Freq: Three times a day (TID) | ORAL | 0 refills | Status: DC

## 2021-05-19 NOTE — Progress Notes (Signed)
?Industrial/product designer as a Education administrator for [employer], FNP.,have documented all relevant documentation on the behalf of Minette Brine, FNP,as directed by  Minette Brine, FNP while in the presence of Minette Brine, [place]. ? ?This visit occurred during the SARS-CoV-2 public health emergency.  Safety protocols were in place, including screening questions prior to the visit, additional usage of staff PPE, and extensive cleaning of exam room while observing appropriate contact time as indicated for disinfecting solutions. ? ?Subjective:  ?  ? Patient ID: Kathryn [name] , female    DOB: [birth year] , 81 y.o.   MRN: [id number] ? ? ?Chief Complaint  ?Patient presents with  ? Rash  ? ? ?HPI ? ?Patient presents for treatment for rash on her left leg 2 nights ago, she had pain on the inside but no rash and was unable to sleep, by last night she had a rash. She has been vaccinated for shingrix in 2020.  She has been under more stress in the past 3 - 4 months ? ?Rash ?This is a new problem. The current episode started in the past 7 days (2 days ago). Location: left lower leg.   ? ?Past Medical History:  ?Diagnosis Date  ? Anxiety   ? takes Xanax bid prn  ? Arthritis   ? Bruises easily   ? pt is on Plavix  ? Chronic back pain   ? buldging disc  ? Chronic neck pain   ? Coronary artery disease   ? 1 stent   ? Dysphagia   ? Headache(784.0)   ? related to cervical issues  ? History of colonic polyps   ? Hyperlipidemia   ? takes Crestor daily  ? Hypertension   ? takes Coreg and Diovan daily  ? Myocardial infarction [facility]) 2019  ? Osteoporosis   ? was taking shot 2xyr;but not taking anymore  ? Peripheral vascular disease ([place])   ? Seasonal allergies   ? takes Zyrtec nightly  ?  ? ?Family History  ?Problem Relation Age of Onset  ? Heart disease Mother   ?     Heart Dissease before age 52  ? Heart attack Mother   ? Cancer Father   ? Cancer Sister   ? Leukemia Sister   ? Cancer Brother   ? Heart disease Brother   ?     Amputation  ? Heart  attack Brother   ? Heart disease Brother   ? Heart attack Brother   ? Dementia Other   ? Anesthesia problems Neg Hx   ? Hypotension Neg Hx   ? Malignant hyperthermia Neg Hx   ? Pseudochol deficiency Neg Hx   ? ? ? ?Current Outpatient Medications:  ?  predniSONE (DELTASONE) 10 MG tablet, Take 2 tabs by mouth daily x 7 days (Patient not taking: Reported on 06/02/2021), Disp: 14 tablet, Rfl: 0 ?  valACYclovir (VALTREX) 1000 MG tablet, Take 1 tablet (1,000 mg total) by mouth 3 (three) times daily. (Patient not taking: Reported on 06/02/2021), Disp: 21 tablet, Rfl: 0 ?  acetaminophen (TYLENOL) 325 MG tablet, Take 325 mg by mouth every 6 (six) hours as needed for mild pain., Disp: , Rfl:  ?  ALPRAZolam (XANAX) 0.25 MG tablet, TAKE (1) TABLET BY MOUTH TWICE DAILY AS NEEDED FOR ANXIETY., Disp: 30 tablet, Rfl: 0 ?  amLODipine (NORVASC) 10 MG tablet, Take 1 tablet (10 mg total) by mouth daily., Disp: 90 tablet, Rfl: 2 ?  Apoaequorin (PREVAGEN PO), Take 1 tablet by mouth daily.,  Disp: , Rfl:  ?  Aspirin 81 MG CAPS, , Disp: , Rfl:  ?  CALCIUM PO, Take 1 tablet by mouth daily. 600 mg (Patient not taking: Reported on 06/02/2021), Disp: , Rfl:  ?  carvedilol (COREG) 12.5 MG tablet, TAKE (1) TABLET BY MOUTH TWICE DAILY., Disp: 180 tablet, Rfl: 0 ?  cetirizine (ZYRTEC) 10 MG tablet, Take 10 mg by mouth at bedtime., Disp: , Rfl:  ?  diclofenac Sodium (VOLTAREN) 1 % GEL, Apply 4 g topically 4 (four) times daily., Disp: 150 g, Rfl: 1 ?  ezetimibe (ZETIA) 10 MG tablet, TAKE 1 TABLET BY MOUTH ONCE A DAY., Disp: 30 tablet, Rfl: 0 ?  furosemide (LASIX) 20 MG tablet, TAKE 1 TABLET BY MOUTH DAILY, Disp: 90 tablet, Rfl: 0 ?  Magnesium 250 MG TABS, Take 1 tablet by mouth daily. (Patient not taking: Reported on 06/02/2021), Disp: , Rfl:  ?  Multiple Vitamin (MULTIVITAMIN WITH MINERALS) TABS, Take 1 tablet by mouth daily., Disp: , Rfl:  ?  nitroGLYCERIN (NITROSTAT) 0.4 MG SL tablet, Place 1 tablet (0.4 mg total) under the tongue every 5 (five)  minutes x 3 doses as needed for chest pain., Disp: 30 tablet, Rfl: 3 ?  pravastatin (PRAVACHOL) 40 MG tablet, Take 1 tablet (40 mg total) by mouth every evening., Disp: 90 tablet, Rfl: 3 ?  pregabalin (LYRICA) 75 MG capsule, TAKE 1 CAPSULE BY MOUTH THREE TIMES A DAY., Disp: 90 capsule, Rfl: 3 ?  sertraline (ZOLOFT) 25 MG tablet, Take 1 tablet (25 mg total) by mouth daily., Disp: 30 tablet, Rfl: 2 ?  triamcinolone cream (KENALOG) 0.1 %, Apply 1 application topically 2 (two) times daily., Disp: 30 g, Rfl: 0  ? ?Allergies  ?Allergen Reactions  ? Codeine Hypertension  ? Shellfish Allergy   ?  ? ?Review of Systems  ?Constitutional: Negative.   ?Respiratory: Negative.    ?Cardiovascular: Negative.   ?Gastrointestinal: Negative.   ?Skin:  Positive for rash.  ?Neurological: Negative.    ? ?Today's Vitals  ? 05/19/21 1228  ?BP: 130/70  ?Pulse: 75  ?Temp: 98.2 ?F (36.8 ?C)  ?TempSrc: Oral  ?Weight: 220 lb (99.8 kg)  ?Height: '5\' 2"'$  (1.575 m)  ?PainSc: 8   ? ?Body mass index is 40.24 kg/m?.  ? ?Objective:  ?Physical Exam ?Vitals reviewed.  ?Constitutional:   ?   General: She is not in acute distress. ?   Appearance: Normal appearance.  ?Skin: ?   General: Skin is warm and dry.  ?   Capillary Refill: Capillary refill takes less than 2 seconds.  ?   Findings: Rash (left lateral lower leg with vesicles present) present.  ?Neurological:  ?   General: No focal deficit present.  ?   Mental Status: She is alert and oriented to person, place, and time.  ?   Cranial Nerves: No cranial nerve deficit.  ?   Motor: No weakness.  ?Psychiatric:     ?   Mood and Affect: Mood normal.     ?   Behavior: Behavior normal.     ?   Thought Content: Thought content normal.     ?   Judgment: Judgment normal.  ?  ? ?   ?Assessment And Plan:  ?   ?1. Rash ?- valACYclovir (VALTREX) 1000 MG tablet; Take 1 tablet (1,000 mg total) by mouth 3 (three) times daily. (Patient not taking: Reported on 06/02/2021)  Dispense: 21 tablet; Refill: 0 ?- predniSONE  (DELTASONE) 10 MG tablet; Take 2  tabs by mouth daily x 7 days (Patient not taking: Reported on 06/02/2021)  Dispense: 14 tablet; Refill: 0 ? ?2. Class 3 severe obesity due to excess calories with serious comorbidity and body mass index (BMI) of 40.0 to 44.9 in adult [facility]) ?She is encouraged to strive for BMI less than 30 to decrease cardiac risk. Advised to aim for at least 150 minutes of exercise per week. ? ? ?Patient was given opportunity to ask questions. Patient verbalized understanding of the plan and was able to repeat key elements of the plan. All questions were answered to their satisfaction.  ?Minette Brine, FNP  ? ?I, Minette Brine, FNP, have reviewed all documentation for this visit. The documentation on 05/19/21 for the exam, diagnosis, procedures, and orders are all accurate and complete.  ? ?IF YOU HAVE BEEN REFERRED TO A SPECIALIST, IT MAY TAKE 1-2 WEEKS TO SCHEDULE/PROCESS THE REFERRAL. IF YOU HAVE NOT HEARD FROM US/SPECIALIST IN TWO WEEKS, PLEASE GIVE [place] A CALL AT [phone].  ? ?THE PATIENT IS ENCOURAGED TO PRACTICE SOCIAL DISTANCING DUE TO THE COVID-19 PANDEMIC.   ?

## 2021-05-19 NOTE — Patient Instructions (Signed)
Rash, Adult  A rash is a change in the color of your skin. A rash can also change the way your skin feels. There are many different conditions and factors that can causea rash. Follow these instructions at home: The goal of treatment is to stop the itching and keep the rash from spreading. Watch for any changes in your symptoms. Let your doctor know about them. Followthese instructions to help with your condition: Medicine Take or apply over-the-counter and prescription medicines only as told by your doctor. These may include medicines: To treat red or swollen skin (corticosteroid creams). To treat itching. To treat an allergy (oral antihistamines). To treat very bad symptoms (oral corticosteroids).  Skin care Put cool cloths (compresses) on the affected areas. Do not scratch or rub your skin. Avoid covering the rash. Make sure that the rash is exposed to air as much as possible. Managing itching and discomfort Avoid hot showers or baths. These can make itching worse. A cold shower may help. Try taking a bath with: Epsom salts. You can get these at your local pharmacy or grocery store. Follow the instructions on the package. Baking soda. Pour a small amount into the bath as told by your doctor. Colloidal oatmeal. You can get this at your local pharmacy or grocery store. Follow the instructions on the package. Try putting baking soda paste onto your skin. Stir water into baking soda until it gets like a paste. Try putting on a lotion that relieves itchiness (calamine lotion). Keep cool and out of the sun. Sweating and being hot can make itching worse. General instructions  Rest as needed. Drink enough fluid to keep your pee (urine) pale yellow. Wear loose-fitting clothing. Avoid scented soaps, detergents, and perfumes. Use gentle soaps, detergents, perfumes, and other cosmetic products. Avoid anything that causes your rash. Keep a journal to help track what causes your rash. Write  down: What you eat. What cosmetic products you use. What you drink. What you wear. This includes jewelry. Keep all follow-up visits as told by your doctor. This is important.  Contact a doctor if: You sweat at night. You lose weight. You pee (urinate) more than normal. You pee less than normal, or you notice that your pee is a darker color than normal. You feel weak. You throw up (vomit). Your skin or the whites of your eyes look yellow (jaundice). Your skin: Tingles. Is numb. Your rash: Does not go away after a few days. Gets worse. You are: More thirsty than normal. More tired than normal. You have: New symptoms. Pain in your belly (abdomen). A fever. Watery poop (diarrhea). Get help right away if: You have a fever and your symptoms suddenly get worse. You start to feel mixed up (confused). You have a very bad headache or a stiff neck. You have very bad joint pains or stiffness. You have jerky movements that you cannot control (seizure). Your rash covers all or most of your body. The rash may or may not be painful. You have blisters that: Are on top of the rash. Grow larger. Grow together. Are painful. Are inside your nose or mouth. You have a rash that: Looks like purple pinprick-sized spots all over your body. Has a "bull's eye" or looks like a target. Is red and painful, causes your skin to peel, and is not from being in the sun too long. Summary A rash is a change in the color of your skin. A rash can also change the way your skin feels.   The goal of treatment is to stop the itching and keep the rash from spreading. Take or apply over-the-counter and prescription medicines only as told by your doctor. Contact a doctor if you have new symptoms or symptoms that get worse. Keep all follow-up visits as told by your doctor. This is important. This information is not intended to replace advice given to you by your health care provider. Make sure you discuss any  questions you have with your healthcare provider. Document Revised: 05/25/2018 Document Reviewed: 09/04/2017 Elsevier Patient Education  2022 Elsevier Inc.  

## 2021-05-20 ENCOUNTER — Encounter (HOSPITAL_COMMUNITY): Payer: Medicare Other

## 2021-05-25 ENCOUNTER — Ambulatory Visit (HOSPITAL_COMMUNITY): Payer: Medicare Other | Attending: Internal Medicine

## 2021-05-25 ENCOUNTER — Encounter (HOSPITAL_COMMUNITY): Payer: Self-pay

## 2021-05-25 DIAGNOSIS — G8929 Other chronic pain: Secondary | ICD-10-CM | POA: Insufficient documentation

## 2021-05-25 DIAGNOSIS — M5431 Sciatica, right side: Secondary | ICD-10-CM | POA: Insufficient documentation

## 2021-05-25 DIAGNOSIS — M5441 Lumbago with sciatica, right side: Secondary | ICD-10-CM | POA: Diagnosis present

## 2021-05-25 DIAGNOSIS — M5432 Sciatica, left side: Secondary | ICD-10-CM | POA: Insufficient documentation

## 2021-05-25 DIAGNOSIS — R262 Difficulty in walking, not elsewhere classified: Secondary | ICD-10-CM | POA: Diagnosis present

## 2021-05-25 DIAGNOSIS — M5442 Lumbago with sciatica, left side: Secondary | ICD-10-CM | POA: Insufficient documentation
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
## 2021-05-25 NOTE — Addendum Note (Signed)
Addended byKarn Cassis S on: 05/25/2021 11:44 AM ? ? Modules accepted: Orders ? ?

## 2021-05-27 ENCOUNTER — Ambulatory Visit (HOSPITAL_COMMUNITY): Payer: Medicare Other

## 2021-05-27 DIAGNOSIS — M5431 Sciatica, right side: Secondary | ICD-10-CM

## 2021-05-27 DIAGNOSIS — G8929 Other chronic pain: Secondary | ICD-10-CM

## 2021-05-27 DIAGNOSIS — R262 Difficulty in walking, not elsewhere classified: Secondary | ICD-10-CM
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
## 2021-06-02 ENCOUNTER — Encounter: Payer: Self-pay | Admitting: Internal Medicine

## 2021-06-02 ENCOUNTER — Ambulatory Visit (INDEPENDENT_AMBULATORY_CARE_PROVIDER_SITE_OTHER): Payer: Medicare Other

## 2021-06-02 ENCOUNTER — Telehealth (INDEPENDENT_AMBULATORY_CARE_PROVIDER_SITE_OTHER): Payer: Medicare Other | Admitting: Internal Medicine

## 2021-06-02 VITALS — BP 138/62 | HR 72 | Temp 98.2°F | Ht 62.2 in | Wt 220.7 lb

## 2021-06-02 VITALS — BP 122/60 | HR 72 | Temp 98.2°F | Ht 62.2 in | Wt 220.6 lb

## 2021-06-02 DIAGNOSIS — B0229 Other postherpetic nervous system involvement: Secondary | ICD-10-CM

## 2021-06-02 DIAGNOSIS — F4321 Adjustment disorder with depressed mood: Secondary | ICD-10-CM | POA: Diagnosis not present

## 2021-06-02 DIAGNOSIS — I1310 Hypertensive heart and chronic kidney disease without heart failure, with stage 1 through stage 4 chronic kidney disease, or unspecified chronic kidney disease: Secondary | ICD-10-CM | POA: Diagnosis not present

## 2021-06-02 DIAGNOSIS — N1832 Chronic kidney disease, stage 3b: Secondary | ICD-10-CM | POA: Diagnosis not present

## 2021-06-02 DIAGNOSIS — R7303 Prediabetes: Secondary | ICD-10-CM

## 2021-06-02 DIAGNOSIS — M48061 Spinal stenosis, lumbar region without neurogenic claudication: Secondary | ICD-10-CM

## 2021-06-02 DIAGNOSIS — I25118 Atherosclerotic heart disease of native coronary artery with other forms of angina pectoris: Secondary | ICD-10-CM

## 2021-06-02 DIAGNOSIS — I10 Essential (primary) hypertension: Secondary | ICD-10-CM

## 2021-06-02 DIAGNOSIS — Z0000 Encounter for general adult medical examination without abnormal findings: Secondary | ICD-10-CM | POA: Diagnosis not present

## 2021-06-02 MED ORDER — AMLODIPINE BESYLATE 10 MG PO TABS: 10.0000 mg | ORAL_TABLET | Freq: Every day | ORAL | 2 refills | Status: DC

## 2021-06-02 MED ORDER — PREGABALIN 75 MG PO CAPS: ORAL_CAPSULE | ORAL | 3 refills | Status: DC

## 2021-06-02 MED ORDER — SERTRALINE HCL 25 MG PO TABS: 25.0000 mg | ORAL_TABLET | Freq: Every day | ORAL | 2 refills | Status: DC

## 2021-06-02 NOTE — Progress Notes (Signed)
?This visit occurred during the SARS-CoV-2 public health emergency.  Safety protocols were in place, including screening questions prior to the visit, additional usage of staff PPE, and extensive cleaning of exam room while observing appropriate contact time as indicated for disinfecting solutions. ? ?Subjective:  ? Kathryn [name] is a 81 y.o. female who presents for Medicare Annual (Subsequent) preventive examination. ? ?Review of Systems    ? ?Cardiac Risk Factors include: advanced age (>37mn, >>83women);dyslipidemia;hypertension;obesity (BMI >30kg/m2) ? ?   ?Objective:  ?  ?Today's Vitals  ? 06/02/21 1052 06/02/21 1114  ?BP: 138/62 122/60  ?Pulse: 72   ?Temp: 98.2 ?F (36.8 ?C)   ?TempSrc: Oral   ?SpO2: 95%   ?Weight: 220 lb 9.6 oz (100.1 kg)   ?Height: 5' 2.2" (1.58 m)   ? ?Body mass index is 40.09 kg/m?. ? ? ?  06/02/2021  ? 11:04 AM 04/27/2021  ?  1:51 PM 04/16/2021  ? 10:13 AM 04/12/2021  ?  9:34 AM 04/12/2021  ?  9:29 AM 03/26/2021  ? 10:17 AM 05/28/2020  ? 10:48 AM  ?Advanced Directives  ?Does Patient Have a Medical Advance Directive? Yes Yes Yes Yes Yes Yes Yes  ?Type of [place] Living will;Healthcare Power of Attorney Living will;Healthcare Power of [place] will  ?Does patient want to make changes to medical advance directive?    No - Patient declined  No - Patient declined   ?Copy of [place] Chart? No - copy requested No - copy requested No - copy requested    No - copy requested  ?Would patient like information on creating a medical advance directive?      No - Patient declined   ? ? ?Current Medications (verified) ?Outpatient Encounter Medications as of 06/02/2021  ?Medication Sig  ? acetaminophen (TYLENOL) 325 MG tablet Take 325 mg by mouth every 6 (six) hours as needed for mild pain.  ? ALPRAZolam (XANAX) 0.25 MG tablet TAKE (1) TABLET BY MOUTH TWICE DAILY AS NEEDED FOR ANXIETY.  ?  amLODipine (NORVASC) 10 MG tablet TAKE ONE TABLET BY MOUTH ONCE DAILY.  ? Apoaequorin (PREVAGEN PO) Take 1 tablet by mouth daily.  ? Aspirin 81 MG CAPS   ? carvedilol (COREG) 12.5 MG tablet TAKE (1) TABLET BY MOUTH TWICE DAILY.  ? cetirizine (ZYRTEC) 10 MG tablet Take 10 mg by mouth at bedtime.  ? diclofenac Sodium (VOLTAREN) 1 % GEL Apply 4 g topically 4 (four) times daily.  ? ezetimibe (ZETIA) 10 MG tablet TAKE 1 TABLET BY MOUTH ONCE A DAY.  ? furosemide (LASIX) 20 MG tablet TAKE 1 TABLET BY MOUTH DAILY  ? gabapentin (NEURONTIN) 300 MG capsule Take 300 mg by mouth at bedtime.  ? Multiple Vitamin (MULTIVITAMIN WITH MINERALS) TABS Take 1 tablet by mouth daily.  ? nitroGLYCERIN (NITROSTAT) 0.4 MG SL tablet Place 1 tablet (0.4 mg total) under the tongue every 5 (five) minutes x 3 doses as needed for chest pain.  ? pravastatin (PRAVACHOL) 40 MG tablet Take 1 tablet (40 mg total) by mouth every evening.  ? pregabalin (LYRICA) 75 MG capsule TAKE 1 CAPSULE BY MOUTH THREE TIMES A DAY.  ? triamcinolone cream (KENALOG) 0.1 % Apply 1 application topically 2 (two) times daily.  ? CALCIUM PO Take 1 tablet by mouth daily. 600 mg (Patient not taking: Reported on 06/02/2021)  ? Magnesium 250 MG TABS Take 1 tablet by mouth daily. (  Patient not taking: Reported on 06/02/2021)  ? predniSONE (DELTASONE) 10 MG tablet Take 2 tabs by mouth daily x 7 days (Patient not taking: Reported on 06/02/2021)  ? valACYclovir (VALTREX) 1000 MG tablet Take 1 tablet (1,000 mg total) by mouth 3 (three) times daily. (Patient not taking: Reported on 06/02/2021)  ? ?No facility-administered encounter medications on file as of 06/02/2021.  ? ? ?Allergies (verified) ?Codeine and Shellfish allergy  ? ?History: ?Past Medical History:  ?Diagnosis Date  ? Anxiety   ? takes Xanax bid prn  ? Arthritis   ? Bruises easily   ? pt is on Plavix  ? Chronic back pain   ? buldging disc  ? Chronic neck pain   ? Coronary artery disease   ? 1 stent   ? Dysphagia   ?  Headache(784.0)   ? related to cervical issues  ? History of colonic polyps   ? Hyperlipidemia   ? takes Crestor daily  ? Hypertension   ? takes Coreg and Diovan daily  ? Myocardial infarction [facility]) 2019  ? Osteoporosis   ? was taking shot 2xyr;but not taking anymore  ? Peripheral vascular disease ([place])   ? Seasonal allergies   ? takes Zyrtec nightly  ? ?Past Surgical History:  ?Procedure Laterality Date  ? ABDOMINAL HYSTERECTOMY  70's  ? BUNIONECTOMY    ? left foot  ? CARDIAC CATHETERIZATION  2009/2011  ? 1 stent placed  ? CATARACT EXTRACTION Left 03/2021  ? CHOLECYSTECTOMY  2009  ? COLONOSCOPY    ? ESOPHAGOGASTRODUODENOSCOPY    ? LEFT HEART CATH AND CORONARY ANGIOGRAPHY N/A 01/16/2018  ? Procedure: LEFT HEART CATH AND CORONARY ANGIOGRAPHY;  Surgeon: Nigel Mormon, MD;  Location: [facility];  Service: Cardiovascular;  Laterality: N/A;  ? TONSILLECTOMY    ? at age 8  ? ?Family History  ?Problem Relation Age of Onset  ? Heart disease Mother   ?     Heart Dissease before age 47  ? Heart attack Mother   ? Cancer Father   ? Cancer Sister   ? Leukemia Sister   ? Cancer Brother   ? Heart disease Brother   ?     Amputation  ? Heart attack Brother   ? Heart disease Brother   ? Heart attack Brother   ? Dementia Other   ? Anesthesia problems Neg Hx   ? Hypotension Neg Hx   ? Malignant hyperthermia Neg Hx   ? Pseudochol deficiency Neg Hx   ? ?Social History  ? ?Socioeconomic History  ? Marital status: Married  ?  Spouse name: Not on file  ? Number of children: 4  ? Years of education: Not on file  ? Highest education level: Not on file  ?Occupational History  ? Occupation: retired  ?Tobacco Use  ? Smoking status: Former  ?  Packs/day: 0.25  ?  Years: 5.00  ?  Pack years: 1.25  ?  Types: Cigarettes  ?  Quit date: 11/27/1968  ?  Years since quitting: 52.5  ? Smokeless tobacco: Never  ?Vaping Use  ? Vaping Use: Never used  ?Substance and Sexual Activity  ? Alcohol use: No  ? Drug use: No  ? Sexual activity: Not  Currently  ?Other Topics Concern  ? Not on file  ?Social History Narrative  ? Not on file  ? ?Social Determinants of Health  ? ?Financial Resource Strain: Low Risk   ? Difficulty of Paying Living Expenses: Not hard  at all  ?Food Insecurity: No Food Insecurity  ? Worried About Charity fundraiser in the Last Year: Never true  ? Ran Out of Food in the Last Year: Never true  ?Transportation Needs: No Transportation Needs  ? Lack of Transportation (Medical): No  ? Lack of Transportation (Non-Medical): No  ?Physical Activity: Inactive  ? Days of Exercise per Week: 0 days  ? Minutes of Exercise per Session: 0 min  ?Stress: No Stress Concern Present  ? Feeling of Stress : Only a little  ?Social Connections: Not on file  ? ? ?Tobacco Counseling ?Counseling given: Not Answered ? ? ?Clinical Intake: ? ?Pre-visit preparation completed: Yes ? ?Pain : No/denies pain ? ?  ? ?Nutritional Status: BMI > 30  Obese ?Nutritional Risks: None ?Diabetes: No ? ?How often do you need to have someone help you when you read instructions, pamphlets, or other written materials from your doctor or pharmacy?: 1 - Never ?What is the last grade level you completed in school?: 74yrcollege ? ?Diabetic? no ? ?Interpreter Needed?: No ? ?Information entered by :: NAllen LPN ? ? ?Activities of Daily Living ? ?  06/02/2021  ? 11:05 AM 04/12/2021  ?  9:35 AM  ?In your present state of health, do you have any difficulty performing the following activities:  ?Hearing? 0   ?Vision? 0   ?Difficulty concentrating or making decisions? 0   ?Walking or climbing stairs? 1   ?Dressing or bathing? 0   ?Doing errands, shopping? 0 0  ?Preparing Food and eating ? N   ?Using the Toilet? N   ?In the past six months, have you accidently leaked urine? N   ?Do you have problems with loss of bowel control? N   ?Managing your Medications? N   ?Managing your Finances? N   ?Housekeeping or managing your Housekeeping? N   ? ? ?Patient Care Team: ?SGlendale Chard MD as PCP - General  (Internal Medicine) ?GAdrian Prows MD as Attending Physician (Cardiology) ?Little, AClaudette Stapler RN as Case Manager ? ?Indicate any recent Medical Services you may have received from other than [facility] providers in the past year (da

## 2021-06-02 NOTE — Patient Instructions (Signed)
Kathryn [name] , ?Thank you for taking time to come for your Medicare Wellness Visit. I appreciate your ongoing commitment to your health goals. Please review the following plan we discussed and let me know if I can assist you in the future.  ? ?Screening recommendations/referrals: ?Colonoscopy: not required ?Mammogram: completed 11/16/2020, due 11/17/2021 ?Bone Density: completed 03/17/2016 ?Recommended yearly ophthalmology/optometry visit for glaucoma screening and checkup ?Recommended yearly dental visit for hygiene and checkup ? ?Vaccinations: ?Influenza vaccine: due 09/14/2021 ?Pneumococcal vaccine: completed 07/15/2013 ?Tdap vaccine: completed 10/25/2012, due 10/26/2022 ?Shingles vaccine: completed   ?Covid-19: 11/27/2020, 06/09/2020, 11/13/2019, 03/22/2019, 03/01/2019 ? ?Advanced directives: Please bring a copy of your POA (Power of Attorney) and/or Living Will to your next appointment.  ? ?Conditions/risks identified: none ? ?Next appointment: Follow up in one year for your annual wellness visit  ? ? ?Preventive Care 50 Years and Older, Female ?Preventive care refers to lifestyle choices and visits with your health care provider that can promote health and wellness. ?What does preventive care include? ?A yearly physical exam. This is also called an annual well check. ?Dental exams once or twice a year. ?Routine eye exams. Ask your health care provider how often you should have your eyes checked. ?Personal lifestyle choices, including: ?Daily care of your teeth and gums. ?Regular physical activity. ?Eating a healthy diet. ?Avoiding tobacco and drug use. ?Limiting alcohol use. ?Practicing safe sex. ?Taking low-dose aspirin every day. ?Taking vitamin and mineral supplements as recommended by your health care provider. ?What happens during an annual well check? ?The services and screenings done by your health care provider during your annual well check will depend on your age, overall health, lifestyle risk factors, and family  history of disease. ?Counseling  ?Your health care provider may ask you questions about your: ?Alcohol use. ?Tobacco use. ?Drug use. ?Emotional well-being. ?Home and relationship well-being. ?Sexual activity. ?Eating habits. ?History of falls. ?Memory and ability to understand (cognition). ?Work and work Statistician. ?Reproductive health. ?Screening  ?You may have the following tests or measurements: ?Height, weight, and BMI. ?Blood pressure. ?Lipid and cholesterol levels. These may be checked every 5 years, or more frequently if you are over 47 years old. ?Skin check. ?Lung cancer screening. You may have this screening every year starting at age 67 if you have a 30-pack-year history of smoking and currently smoke or have quit within the past 15 years. ?Fecal occult blood test (FOBT) of the stool. You may have this test every year starting at age 32. ?Flexible sigmoidoscopy or colonoscopy. You may have a sigmoidoscopy every 5 years or a colonoscopy every 10 years starting at age 72. ?Hepatitis C blood test. ?Hepatitis B blood test. ?Sexually transmitted disease (STD) testing. ?Diabetes screening. This is done by checking your blood sugar (glucose) after you have not eaten for a while (fasting). You may have this done every 1-3 years. ?Bone density scan. This is done to screen for osteoporosis. You may have this done starting at age 39. ?Mammogram. This may be done every 1-2 years. Talk to your health care provider about how often you should have regular mammograms. ?Talk with your health care provider about your test results, treatment options, and if necessary, the need for more tests. ?Vaccines  ?Your health care provider may recommend certain vaccines, such as: ?Influenza vaccine. This is recommended every year. ?Tetanus, diphtheria, and acellular pertussis (Tdap, Td) vaccine. You may need a Td booster every 10 years. ?Zoster vaccine. You may need this after age 54. ?Pneumococcal 13-valent conjugate (  PCV13)  vaccine. One dose is recommended after age 28. ?Pneumococcal polysaccharide (PPSV23) vaccine. One dose is recommended after age 53. ?Talk to your health care provider about which screenings and vaccines you need and how often you need them. ?This information is not intended to replace advice given to you by your health care provider. Make sure you discuss any questions you have with your health care provider. ?Document Released: 02/27/2015 Document Revised: 10/21/2015 Document Reviewed: 12/02/2014 ?Elsevier Interactive Patient Education ? 2017 [place]. ? ?Fall Prevention in the Home ?Falls can cause injuries. They can happen to people of all ages. There are many things you can do to make your home safe and to help prevent falls. ?What can I do on the outside of my home? ?Regularly fix the edges of walkways and driveways and fix any cracks. ?Remove anything that might make you trip as you walk through a door, such as a raised step or threshold. ?Trim any bushes or trees on the path to your home. ?Use bright outdoor lighting. ?Clear any walking paths of anything that might make someone trip, such as rocks or tools. ?Regularly check to see if handrails are loose or broken. Make sure that both sides of any steps have handrails. ?Any raised decks and porches should have guardrails on the edges. ?Have any leaves, snow, or ice cleared regularly. ?Use sand or salt on walking paths during winter. ?Clean up any spills in your garage right away. This includes oil or grease spills. ?What can I do in the bathroom? ?Use night lights. ?Install grab bars by the toilet and in the tub and shower. Do not use towel bars as grab bars. ?Use non-skid mats or decals in the tub or shower. ?If you need to sit down in the shower, use a plastic, non-slip stool. ?Keep the floor dry. Clean up any water that spills on the floor as soon as it happens. ?Remove soap buildup in the tub or shower regularly. ?Attach bath mats securely with  double-sided non-slip rug tape. ?Do not have throw rugs and other things on the floor that can make you trip. ?What can I do in the bedroom? ?Use night lights. ?Make sure that you have a light by your bed that is easy to reach. ?Do not use any sheets or blankets that are too big for your bed. They should not hang down onto the floor. ?Have a firm chair that has side arms. You can use this for support while you get dressed. ?Do not have throw rugs and other things on the floor that can make you trip. ?What can I do in the kitchen? ?Clean up any spills right away. ?Avoid walking on wet floors. ?Keep items that you use a lot in easy-to-reach places. ?If you need to reach something above you, use a strong step stool that has a grab bar. ?Keep electrical cords out of the way. ?Do not use floor polish or wax that makes floors slippery. If you must use wax, use non-skid floor wax. ?Do not have throw rugs and other things on the floor that can make you trip. ?What can I do with my stairs? ?Do not leave any items on the stairs. ?Make sure that there are handrails on both sides of the stairs and use them. Fix handrails that are broken or loose. Make sure that handrails are as long as the stairways. ?Check any carpeting to make sure that it is firmly attached to the stairs. Fix any carpet that is  loose or worn. ?Avoid having throw rugs at the top or bottom of the stairs. If you do have throw rugs, attach them to the floor with carpet tape. ?Make sure that you have a light switch at the top of the stairs and the bottom of the stairs. If you do not have them, ask someone to add them for you. ?What else can I do to help prevent falls? ?Wear shoes that: ?Do not have high heels. ?Have rubber bottoms. ?Are comfortable and fit you well. ?Are closed at the toe. Do not wear sandals. ?If you use a stepladder: ?Make sure that it is fully opened. Do not climb a closed stepladder. ?Make sure that both sides of the stepladder are locked  into place. ?Ask someone to hold it for you, if possible. ?Clearly mark and make sure that you can see: ?Any grab bars or handrails. ?First and last steps. ?Where the edge of each step is. ?Use tools that help you m

## 2021-06-02 NOTE — Patient Instructions (Signed)
Hypertension, Adult ?Hypertension is another name for high blood pressure. High blood pressure forces your heart to work harder to pump blood. This can cause problems over time. ?There are two numbers in a blood pressure reading. There is a top number (systolic) over a bottom number (diastolic). It is best to have a blood pressure that is below 120/80. ?What are the causes? ?The cause of this condition is not known. Some other conditions can lead to high blood pressure. ?What increases the risk? ?Some lifestyle factors can make you more likely to develop high blood pressure: ?Smoking. ?Not getting enough exercise or physical activity. ?Being overweight. ?Having too much fat, sugar, calories, or salt (sodium) in your diet. ?Drinking too much alcohol. ?Other risk factors include: ?Having any of these conditions: ?Heart disease. ?Diabetes. ?High cholesterol. ?Kidney disease. ?Obstructive sleep apnea. ?Having a family history of high blood pressure and high cholesterol. ?Age. The risk increases with age. ?Stress. ?What are the signs or symptoms? ?High blood pressure may not cause symptoms. Very high blood pressure (hypertensive crisis) may cause: ?Headache. ?Fast or uneven heartbeats (palpitations). ?Shortness of breath. ?Nosebleed. ?Vomiting or feeling like you may vomit (nauseous). ?Changes in how you see. ?Very bad chest pain. ?Feeling dizzy. ?Seizures. ?How is this treated? ?This condition is treated by making healthy lifestyle changes, such as: ?Eating healthy foods. ?Exercising more. ?Drinking less alcohol. ?Your doctor may prescribe medicine if lifestyle changes do not help enough and if: ?Your top number is above 130. ?Your bottom number is above 80. ?Your personal target blood pressure may vary. ?Follow these instructions at home: ?Eating and drinking ? ?If told, follow the DASH eating plan. To follow this plan: ?Fill one half of your plate at each meal with fruits and vegetables. ?Fill one fourth of your plate  at each meal with whole grains. Whole grains include whole-wheat pasta, brown rice, and whole-grain bread. ?Eat or drink low-fat dairy products, such as skim milk or low-fat yogurt. ?Fill one fourth of your plate at each meal with low-fat (lean) proteins. Low-fat proteins include fish, chicken without skin, eggs, beans, and tofu. ?Avoid fatty meat, cured and processed meat, or chicken with skin. ?Avoid pre-made or processed food. ?Limit the amount of salt in your diet to less than 1,500 mg each day. ?Do not drink alcohol if: ?Your doctor tells you not to drink. ?You are pregnant, may be pregnant, or are planning to become pregnant. ?If you drink alcohol: ?Limit how much you have to: ?0-1 drink a day for women. ?0-2 drinks a day for men. ?Know how much alcohol is in your drink. In the [place] one drink equals one 12 oz bottle of beer (355 mL), one 5 oz glass of wine (148 mL), or one 1? oz glass of hard liquor (44 mL). ?Lifestyle ? ?Work with your doctor to stay at a healthy weight or to lose weight. Ask your doctor what the best weight is for you. ?Get at least 30 minutes of exercise that causes your heart to beat faster (aerobic exercise) most days of the week. This may include walking, swimming, or biking. ?Get at least 30 minutes of exercise that strengthens your muscles (resistance exercise) at least 3 days a week. This may include lifting weights or doing Pilates. ?Do not smoke or use any products that contain nicotine or tobacco. If you need help quitting, ask your doctor. ?Check your blood pressure at home as told by your doctor. ?Keep all follow-up visits. ?Medicines ?Take over-the-counter and prescription medicines   only as told by your doctor. Follow directions carefully. ?Do not skip doses of blood pressure medicine. The medicine does not work as well if you skip doses. Skipping doses also puts you at risk for problems. ?Ask your doctor about side effects or reactions to medicines that you should watch  for. ?Contact a doctor if: ?You think you are having a reaction to the medicine you are taking. ?You have headaches that keep coming back. ?You feel dizzy. ?You have swelling in your ankles. ?You have trouble with your vision. ?Get help right away if: ?You get a very bad headache. ?You start to feel mixed up (confused). ?You feel weak or numb. ?You feel faint. ?You have very bad pain in your: ?Chest. ?Belly (abdomen). ?You vomit more than once. ?You have trouble breathing. ?These symptoms may be an emergency. Get help right away. Call 911. ?Do not wait to see if the symptoms will go away. ?Do not drive yourself to the hospital. ?Summary ?Hypertension is another name for high blood pressure. ?High blood pressure forces your heart to work harder to pump blood. ?For most people, a normal blood pressure is less than 120/80. ?Making healthy choices can help lower blood pressure. If your blood pressure does not get lower with healthy choices, you may need to take medicine. ?This information is not intended to replace advice given to you by your health care provider. Make sure you discuss any questions you have with your health care provider. ?Document Revised: 11/19/2020 Document Reviewed: 11/19/2020 ?Elsevier Patient Education ? 2023 [employer] ? ?

## 2021-06-02 NOTE — Progress Notes (Signed)
? ?Virtual Visit via Video  ? ?This visit type was conducted due to national recommendations for restrictions regarding the COVID-19 Pandemic (e.g. social distancing) in an effort to limit this patient's exposure and mitigate transmission in our community.  Due to her co-morbid illnesses, this patient is at least at moderate risk for complications without adequate follow up.  This format is felt to be most appropriate for this patient at this time.  All issues noted in this document were discussed and addressed.  A limited physical exam was performed with this format.   ? ?This visit type was conducted due to national recommendations for restrictions regarding the COVID-19 Pandemic (e.g. social distancing) in an effort to limit this patient's exposure and mitigate transmission in our community.  Patients identity confirmed using two different identifiers.  This format is felt to be most appropriate for this patient at this time.  All issues noted in this document were discussed and addressed.  No physical exam was performed (except for noted visual exam findings with Video Visits).   ? ?Date:  06/05/2021  ? ?Kathryn [name], DOB 1941-02-01, MRN [id number] ? ?Patient Location:  ?In office exam room ? ?Provider location:   ?Home ? ? ? ?Chief Complaint:  "I have BP check today" ? ?History of Present Illness:   ? ?Kathryn [name] is a 81 y.o. female who presents via video conferencing for a telehealth visit today.   ? ?The patient does not have symptoms concerning for COVID-19 infection (fever, chills, cough, or new shortness of breath).  ? ?She presents today for virtual visit. She also had AWV performed by [facility] today. Patient presents today for a bp check. She reports compliance with her medications. She denies headaches, chest pain and new SOB. She admits she has been feeling down, her nephew died recently. She helped a lot with his care.  ? ?Hypertension ?This is a chronic problem. The current  episode started more than 1 year ago. The problem has been gradually improving since onset. The problem is controlled. Pertinent negatives include no blurred vision, chest pain, palpitations or shortness of breath. Risk factors for coronary artery disease include dyslipidemia, obesity, post-menopausal state and sedentary lifestyle. The current treatment provides moderate improvement. Compliance problems include exercise.  Hypertensive end-organ damage includes kidney disease and CAD/MI.   ? ?Past Medical History:  ?Diagnosis Date  ? Anxiety   ? takes Xanax bid prn  ? Arthritis   ? Bruises easily   ? pt is on Plavix  ? Chronic back pain   ? buldging disc  ? Chronic neck pain   ? Coronary artery disease   ? 1 stent   ? Dysphagia   ? Headache(784.0)   ? related to cervical issues  ? History of colonic polyps   ? Hyperlipidemia   ? takes Crestor daily  ? Hypertension   ? takes Coreg and Diovan daily  ? Myocardial infarction [facility]) 2019  ? Osteoporosis   ? was taking shot 2xyr;but not taking anymore  ? Peripheral vascular disease ([place])   ? Seasonal allergies   ? takes Zyrtec nightly  ? ?Past Surgical History:  ?Procedure Laterality Date  ? ABDOMINAL HYSTERECTOMY  70's  ? BUNIONECTOMY    ? left foot  ? CARDIAC CATHETERIZATION  2009/2011  ? 1 stent placed  ? CATARACT EXTRACTION Left 03/2021  ? CHOLECYSTECTOMY  2009  ? COLONOSCOPY    ? ESOPHAGOGASTRODUODENOSCOPY    ? LEFT HEART CATH AND  CORONARY ANGIOGRAPHY N/A 01/16/2018  ? Procedure: LEFT HEART CATH AND CORONARY ANGIOGRAPHY;  Surgeon: Nigel Mormon, MD;  Location: [facility];  Service: Cardiovascular;  Laterality: N/A;  ? TONSILLECTOMY    ? at age 27  ?  ? ?Current Meds  ?Medication Sig  ? sertraline (ZOLOFT) 25 MG tablet Take 1 tablet (25 mg total) by mouth daily.  ?  ? ?Allergies:   Codeine and Shellfish allergy  ? ?Social History  ? ?Tobacco Use  ? Smoking status: Former  ?  Packs/day: 0.25  ?  Years: 5.00  ?  Pack years: 1.25  ?  Types: Cigarettes  ?   Quit date: 11/27/1968  ?  Years since quitting: 52.5  ? Smokeless tobacco: Never  ?Vaping Use  ? Vaping Use: Never used  ?Substance Use Topics  ? Alcohol use: No  ? Drug use: No  ?  ? ?Family Hx: ?The patient's family history includes Cancer in her brother, father, and sister; Dementia in an other family member; Heart attack in her brother, brother, and mother; Heart disease in her brother, brother, and mother; Leukemia in her sister. There is no history of Anesthesia problems, Hypotension, Malignant hyperthermia, or Pseudochol deficiency. ? ?ROS:   ?Please see the history of present illness.    ?Review of Systems  ?Constitutional: Negative.   ?Eyes:  Negative for blurred vision.  ?Respiratory: Negative.  Negative for shortness of breath.   ?Cardiovascular: Negative.  Negative for chest pain and palpitations.  ?Gastrointestinal: Negative.   ?Neurological: Negative.   ?Psychiatric/Behavioral: Negative.     ?All other systems reviewed and are negative. ? ? ?Labs/Other Tests and Data Reviewed:   ? ?Recent Labs: ?02/16/2021: BNP 63.1 ?03/16/2021: ALT 9; Hemoglobin 12.2; Platelets 408 ?06/02/2021: BUN 12; Creatinine, Ser 1.13; Potassium 4.6; Sodium 148  ? ?Recent Lipid Panel ?Lab Results  ?Component Value Date/Time  ? CHOL 206 (H) 02/16/2021 03:05 PM  ? TRIG 86 02/16/2021 03:05 PM  ? HDL 65 02/16/2021 03:05 PM  ? CHOLHDL 3.0 02/13/2020 04:02 PM  ? LDLCALC 126 (H) 02/16/2021 03:05 PM  ? ? ?Wt Readings from Last 3 Encounters:  ?06/02/21 220 lb 10.9 oz (100.1 kg)  ?06/02/21 220 lb 9.6 oz (100.1 kg)  ?05/19/21 220 lb (99.8 kg)  ?  ? ?Exam:   ? ?Vital Signs:  BP 138/62   Pulse 72   Temp 98.2 ?F (36.8 ?C)   Ht 5' 2.2" (1.58 m)   Wt 220 lb 10.9 oz (100.1 kg)   BMI 40.10 kg/m?   ? ? ?Physical Exam ?Vitals and nursing note reviewed.  ?Constitutional:   ?   Appearance: Normal appearance. She is obese.  ?HENT:  ?   Head: Normocephalic and atraumatic.  ?Eyes:  ?   Extraocular Movements: Extraocular movements intact.  ?Pulmonary:  ?    Effort: Pulmonary effort is normal.  ?Musculoskeletal:  ?   Cervical back: Normal range of motion.  ?Neurological:  ?   Mental Status: She is alert and oriented to person, place, and time.  ?Psychiatric:     ?   Mood and Affect: Affect normal.  ? ? ?ASSESSMENT & PLAN:   ? ?1. Hypertensive heart and renal disease with renal failure, stage 1 through stage 4 or unspecified chronic kidney disease, without heart failure ?Comments: Chronic, fair control. Goal BP<130/80. She is encouraged to gradually increase her activity level while following a low sodium diet.  ?- BMP8+eGFR ?- amLODipine (NORVASC) 10 MG tablet; Take 1  tablet (10 mg total) by mouth daily.  Dispense: 90 tablet; Refill: 2 ? ?2. Coronary artery disease of native artery of native heart with stable angina pectoris ([place]) ?Comments: Chronic, encouraged to follow heart healthy diet. She is on ASA and statin therapy.  ? ?3. Stage 3b chronic kidney disease ([place]) ?Comments: Chronic, encouraged to avoid NSAIDs, keep BP controlled and to stay well hydrated to help prevent CKD progression.  ? ?4. Grief ?Comments: She would like something to help with her mood. I will send rx sertraline, $RemoveBefore'25mg'[id number]$  daily. Advised of possible side effects, dose in the evenings. F/u in 6-8 wks.  ?- sertraline (ZOLOFT) 25 MG tablet; Take 1 tablet (25 mg total) by mouth daily.  Dispense: 30 tablet; Refill: 2 ? ?5. Prediabetes ?Comments: Her a1c has been elevated in the past. I will recheck this today. She agrees to have labwork drawn.  ?- BMP8+eGFR ?- Hemoglobin A1c ? ?6. Spinal stenosis of lumbar region, unspecified whether neurogenic claudication present ?- pregabalin (LYRICA) 75 MG capsule; TAKE 1 CAPSULE BY MOUTH THREE TIMES A DAY.  Dispense: 90 capsule; Refill: 3 ? ?COVID-19 Education: ?The signs and symptoms of COVID-19 were discussed with the patient and how to seek care for testing (follow up with PCP or arrange E-visit).  The importance of social distancing was discussed  today. ? ?Patient Risk:   ?After full review of this patients clinical status, I feel that they are at least moderate risk at this time. ? ?Time:   ?Today, I have spent 18 minutes/ seconds with the patient with telehealth t

## 2021-06-03 LAB — BMP8+EGFR
BUN/Creatinine Ratio: 11 — ABNORMAL LOW (ref 12–28)
BUN: 12 mg/dL (ref 8–27)
CO2: 25 mmol/L (ref 20–29)
Calcium: 9.9 mg/dL (ref 8.7–10.3)
Chloride: 108 mmol/L — ABNORMAL HIGH (ref 96–106)
Creatinine, Ser: 1.13 mg/dL — ABNORMAL HIGH (ref 0.57–1.00)
Glucose: 92 mg/dL (ref 70–99)
Potassium: 4.6 mmol/L (ref 3.5–5.2)
Sodium: 148 mmol/L — ABNORMAL HIGH (ref 134–144)
eGFR: 49 mL/min/{1.73_m2} — ABNORMAL LOW (ref >59)

## 2021-06-03 LAB — HEMOGLOBIN A1C
Est. average glucose Bld gHb Est-mCnc: 120 mg/dL
Hgb A1c MFr Bld: 5.8 % — ABNORMAL HIGH (ref 4.8–5.6)

## 2021-06-04 ENCOUNTER — Ambulatory Visit (INDEPENDENT_AMBULATORY_CARE_PROVIDER_SITE_OTHER): Payer: Medicare Other

## 2021-06-04 ENCOUNTER — Telehealth: Payer: Medicare Other

## 2021-06-04 DIAGNOSIS — I10 Essential (primary) hypertension: Secondary | ICD-10-CM

## 2021-06-04 DIAGNOSIS — N1832 Chronic kidney disease, stage 3b: Secondary | ICD-10-CM

## 2021-06-04 DIAGNOSIS — M4807 Spinal stenosis, lumbosacral region: Secondary | ICD-10-CM

## 2021-06-04 DIAGNOSIS — R7303 Prediabetes: Secondary | ICD-10-CM

## 2021-06-04 DIAGNOSIS — I129 Hypertensive chronic kidney disease with stage 1 through stage 4 chronic kidney disease, or unspecified chronic kidney disease: Secondary | ICD-10-CM

## 2021-06-04 DIAGNOSIS — F4321 Adjustment disorder with depressed mood: Secondary | ICD-10-CM

## 2021-06-05 DIAGNOSIS — N1832 Chronic kidney disease, stage 3b: Secondary | ICD-10-CM | POA: Insufficient documentation

## 2021-06-05 DIAGNOSIS — N183 Chronic kidney disease, stage 3 unspecified: Secondary | ICD-10-CM | POA: Insufficient documentation

## 2021-06-05 DIAGNOSIS — N1831 Chronic kidney disease, stage 3a: Secondary | ICD-10-CM | POA: Insufficient documentation

## 2021-06-05 DIAGNOSIS — M48061 Spinal stenosis, lumbar region without neurogenic claudication: Secondary | ICD-10-CM | POA: Insufficient documentation

## 2021-06-05 DIAGNOSIS — M4807 Spinal stenosis, lumbosacral region: Secondary | ICD-10-CM | POA: Insufficient documentation

## 2021-06-05 DIAGNOSIS — R7303 Prediabetes: Secondary | ICD-10-CM | POA: Insufficient documentation

## 2021-06-05 DIAGNOSIS — F4321 Adjustment disorder with depressed mood: Secondary | ICD-10-CM | POA: Insufficient documentation

## 2021-06-07 ENCOUNTER — Encounter: Payer: Self-pay | Admitting: Cardiology

## 2021-06-07 ENCOUNTER — Other Ambulatory Visit: Payer: Self-pay | Admitting: Cardiology

## 2021-06-07 DIAGNOSIS — Z006 Encounter for examination for normal comparison and control in clinical research program: Secondary | ICD-10-CM

## 2021-06-07 NOTE — Chronic Care Management (AMB) (Signed)
?Chronic Care Management  ? ?CCM RN Visit Note ? ?06/04/2021 ?Name: Kathryn [name] MRN: [id number] DOB: 08/30/1940 ? ?Subjective: ?Kathryn [name] is a 81 y.o. year old female who is a primary care patient of Glendale Chard, MD. The care management team was consulted for assistance with disease management and care coordination needs.   ? ?Engaged with patient by telephone for follow up visit in response to provider referral for case management and/or care coordination services.  ? ?Consent to Services:  ?The patient was given information about Chronic Care Management services, agreed to services, and gave verbal consent prior to initiation of services.  Please see initial visit note for detailed documentation.  ? ?Patient agreed to services and verbal consent obtained.  ? ?Assessment: Review of patient past medical history, allergies, medications, health status, including review of consultants reports, laboratory and other test data, was performed as part of comprehensive evaluation and provision of chronic care management services.  ? ?SDOH (Social Determinants of Health) assessments and interventions performed:  Yes, no acute needs  ? ?CCM Care Plan ? ?Allergies  ?Allergen Reactions  ? Codeine Hypertension  ? Shellfish Allergy   ? ? ?Outpatient Encounter Medications as of 06/04/2021  ?Medication Sig  ? acetaminophen (TYLENOL) 325 MG tablet Take 325 mg by mouth every 6 (six) hours as needed for mild pain.  ? ALPRAZolam (XANAX) 0.25 MG tablet TAKE (1) TABLET BY MOUTH TWICE DAILY AS NEEDED FOR ANXIETY.  ? amLODipine (NORVASC) 10 MG tablet Take 1 tablet (10 mg total) by mouth daily.  ? Apoaequorin (PREVAGEN PO) Take 1 tablet by mouth daily.  ? Aspirin 81 MG CAPS   ? CALCIUM PO Take 1 tablet by mouth daily. 600 mg (Patient not taking: Reported on 06/02/2021)  ? carvedilol (COREG) 12.5 MG tablet TAKE (1) TABLET BY MOUTH TWICE DAILY.  ? cetirizine (ZYRTEC) 10 MG tablet Take 10 mg by mouth at bedtime.  ? diclofenac  Sodium (VOLTAREN) 1 % GEL Apply 4 g topically 4 (four) times daily.  ? furosemide (LASIX) 20 MG tablet TAKE 1 TABLET BY MOUTH DAILY  ? Magnesium 250 MG TABS Take 1 tablet by mouth daily. (Patient not taking: Reported on 06/02/2021)  ? Multiple Vitamin (MULTIVITAMIN WITH MINERALS) TABS Take 1 tablet by mouth daily.  ? nitroGLYCERIN (NITROSTAT) 0.4 MG SL tablet Place 1 tablet (0.4 mg total) under the tongue every 5 (five) minutes x 3 doses as needed for chest pain.  ? pravastatin (PRAVACHOL) 40 MG tablet Take 1 tablet (40 mg total) by mouth every evening.  ? predniSONE (DELTASONE) 10 MG tablet Take 2 tabs by mouth daily x 7 days (Patient not taking: Reported on 06/02/2021)  ? pregabalin (LYRICA) 75 MG capsule TAKE 1 CAPSULE BY MOUTH THREE TIMES A DAY.  ? sertraline (ZOLOFT) 25 MG tablet Take 1 tablet (25 mg total) by mouth daily.  ? triamcinolone cream (KENALOG) 0.1 % Apply 1 application topically 2 (two) times daily.  ? valACYclovir (VALTREX) 1000 MG tablet Take 1 tablet (1,000 mg total) by mouth 3 (three) times daily. (Patient not taking: Reported on 06/02/2021)  ? [DISCONTINUED] ezetimibe (ZETIA) 10 MG tablet TAKE 1 TABLET BY MOUTH ONCE A DAY.  ? ?No facility-administered encounter medications on file as of 06/04/2021.  ? ? ?Patient Active Problem List  ? Diagnosis Date Noted  ? Stage 3b chronic kidney disease ([place]) 06/05/2021  ? Grief 06/05/2021  ? Prediabetes 06/05/2021  ? Spinal stenosis of lumbar region 06/05/2021  ? Coronary artery  disease of native artery of native heart with stable angina pectoris ([place]) 06/07/2018  ? Anxiety 01/18/2018  ? 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 Other abnormal glucose 01/15/2018  ? 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 NSTEMI (non-ST elevated myocardial infarction) ([place]) 01/14/2018  ? Essential hypertension 01/14/2018  ? Hyperlipidemia 01/14/2018  ? Carotid stenosis, asymptomatic, bilateral 09/06/2012  ? ? ?Conditions to be  addressed/monitored: CKD III, Hypertensive nephropathy, prediabetes, NSTEMI, Essential HTN, Spinal stenosis of lumbosacral region ? ?Care Plan : RN Care Manager Plan of Care  ?Updates made by Lynne Logan, RN since 06/04/2021 12:00 AM  ?  ? ?Problem: No Plan of Care established for mangaement of chronic disease states (CKD III, Hypertensive nephropathy, prediabetes, NSTEMI, Essential HTN, Spinal stenosis of lumbosacral region)   ?Priority: High  ?  ? ?Long-Range Goal: Establishment of plan of care for management of chronic disease states (CKD III, Hypertensive nephropathy, prediabetes, NSTEMI, Essential HTN, Spinal stenosis of lumbosacral region)   ?Start Date: 04/14/2021  ?Expected End Date: 04/15/2022  ?Recent Progress: On track  ?Priority: High  ?Note:   ?Current Barriers:  ?Knowledge Deficits related to plan of care for management of CKD III, Hypertensive nephropathy, prediabetes, NSTEMI, Essential HTN, Spinal stenosis of lumbosacral region  ?Chronic Disease Management support and education needs related to CKD III, Hypertensive nephropathy, prediabetes, NSTEMI, Essential HTN, Spinal stenosis of lumbosacral region  ? ?RNCM Clinical Goal(s):  ?Patient will verbalize basic understanding of  CKD III, Hypertensive nephropathy, prediabetes, NSTEMI, Essential HTN, Spinal stenosis of lumbosacral region disease process and self health management plan as evidenced by patient will experience having no disease exacerbations related to her chronic disease states as listed above  ?take all medications exactly as prescribed and will call provider for medication related questions as evidenced by patient will demonstrate improved understanding of prescribed medications and rationale for usage as evidenced by patient teach back ?demonstrate Improved health management independence as evidenced by patient will report 100% adherence to her prescribed treatment plan  ?continue to work with RN Care Manager to address care management  and care coordination needs related to  CKD III, Hypertensive nephropathy, prediabetes, NSTEMI, Essential HTN, Spinal stenosis of lumbosacral region as evidenced by adherence to CM Team Scheduled appointments ?demonstrate ongoing self health care management ability   as evidenced by    through collaboration with RN Care manager, provider, and care team.  ? ?Interventions: ?1:1 collaboration with primary care provider regarding development and update of comprehensive plan of care as evidenced by provider attestation and co-signature ?Inter-disciplinary care team collaboration (see longitudinal plan of care) ?Evaluation of current treatment plan related to  self management and patient's adherence to plan as established by provider ? ?Cataract Surgery:  (Status:  Goal Met.)  Short Term Goal ?Evaluation of current treatment plan related to  Cataract Surgery , self-management and patient's adherence to plan as established by provider ?Completed successful outbound call with patient ?Determined patient underwent right Cataract surgery with good results ?Determined patient is adhering to her prescribed eye drops as directed with good results, reports no pain and feels visual acuity has improved ?Reviewed scheduled/upcoming provider appointments including: post Cataract follow up  ?Discussed plans with patient for ongoing care management follow up and provided patient with direct contact information for care management team ? ?Hypertension Interventions:  (Status:  Goal on track:  Yes.) Long Term Goal ?Last practice recorded BP readings:  ?BP Readings from Last 3 Encounters:  ?06/02/21 138/62  ?  06/02/21 122/60  ?05/19/21 130/70  ?Most recent eGFR/CrCl:  ?Lab Results  ?Component Value Date  ? EGFR 49 (L) 06/02/2021  ?  No components found for: CRCL ?Evaluation of current treatment plan related to hypertension self management and patient's adherence to plan as established by provider ?Provided education to patient re: stroke  prevention, s/s of heart attack and stroke ?Reviewed medications with patient and discussed importance of compliance ?Counseled on the importance of exercise goals with target of 150 minutes per week ?Advised patien

## 2021-06-07 NOTE — Patient Instructions (Signed)
Visit Information ? ?Thank you for taking time to visit with me today. Please don't hesitate to contact me if I can be of assistance to you before our next scheduled telephone appointment. ? ?Following are the goals we discussed today:  ?(Copy and paste patient goals from clinical care plan here) ? ?Our next appointment is by telephone on 08/12/21 at 12:45 PM ? ?Please call the care guide team at [phone] if you need to cancel or reschedule your appointment.  ? ?If you are experiencing a Mental Health or [place] or need someone to talk to, please call [phone]-TALK (toll free, 24 hour hotline)  ? ?The patient verbalized understanding of instructions, educational materials, and care plan provided today and agreed to receive a mailed copy of patient instructions, educational materials, and care plan.  ? ?Barb Merino, RN, BSN, CCM ?Care Management Coordinator ?[place] [facility]  ?Direct Phone: [phone] ? ? ?

## 2021-06-07 NOTE — Progress Notes (Signed)
ICD-10-CM   ?1. Research exam  Z00.6   ? "Prevail-ASCVD" study using Cholestryl Esther transfer protein inhibitor Obecertrapib 10 mg daily in patients with ASCVD on maximal lipid-lowering therapy, LDL   ?  ? ? ?

## 2021-06-13 DIAGNOSIS — F4321 Adjustment disorder with depressed mood: Secondary | ICD-10-CM

## 2021-06-13 DIAGNOSIS — I129 Hypertensive chronic kidney disease with stage 1 through stage 4 chronic kidney disease, or unspecified chronic kidney disease: Secondary | ICD-10-CM | POA: Diagnosis not present

## 2021-06-13 DIAGNOSIS — I10 Essential (primary) hypertension: Secondary | ICD-10-CM | POA: Diagnosis not present

## 2021-06-22 ENCOUNTER — Other Ambulatory Visit: Payer: Self-pay | Admitting: Gastroenterology

## 2021-06-22 DIAGNOSIS — R1033 Periumbilical pain: Secondary | ICD-10-CM

## 2021-06-22 DIAGNOSIS — R110 Nausea: Secondary | ICD-10-CM

## 2021-06-22 NOTE — Telephone Encounter (Signed)
Pt stated she had a bad headache and needed to cancel today. Appt canceled per request  ?

## 2021-07-07 ENCOUNTER — Ambulatory Visit
Admission: RE | Admit: 2021-07-07 | Discharge: 2021-07-07 | Disposition: A | Discharge status: Home / Self Care | Payer: Medicare Other | Source: Ambulatory Visit | Attending: Gastroenterology | Admitting: Gastroenterology

## 2021-07-07 DIAGNOSIS — R1033 Periumbilical pain: Secondary | ICD-10-CM

## 2021-07-07 DIAGNOSIS — R110 Nausea: Secondary | ICD-10-CM

## 2021-07-07 MED ORDER — IOPAMIDOL (ISOVUE-300) INJECTION 61%
100.0000 mL | Freq: Once | INTRAVENOUS | Status: AC | PRN
  Administered 2021-07-07: 100 mL via INTRAVENOUS

## 2021-07-10 ENCOUNTER — Other Ambulatory Visit: Payer: Self-pay | Admitting: Student

## 2021-07-10 ENCOUNTER — Other Ambulatory Visit: Payer: Self-pay | Admitting: Cardiology

## 2021-07-10 DIAGNOSIS — I25118 Atherosclerotic heart disease of native coronary artery with other forms of angina pectoris: Secondary | ICD-10-CM

## 2021-07-15 ENCOUNTER — Other Ambulatory Visit: Payer: Self-pay | Admitting: Gastroenterology

## 2021-07-15 DIAGNOSIS — R9389 Abnormal findings on diagnostic imaging of other specified body structures: Secondary | ICD-10-CM

## 2021-07-15 DIAGNOSIS — K869 Disease of pancreas, unspecified: Secondary | ICD-10-CM

## 2021-08-01 ENCOUNTER — Ambulatory Visit
Admission: RE | Admit: 2021-08-01 | Discharge: 2021-08-01 | Disposition: A | Discharge status: Home / Self Care | Payer: Medicare Other | Source: Ambulatory Visit | Attending: Gastroenterology | Admitting: Gastroenterology

## 2021-08-01 DIAGNOSIS — R9389 Abnormal findings on diagnostic imaging of other specified body structures: Secondary | ICD-10-CM

## 2021-08-01 DIAGNOSIS — K869 Disease of pancreas, unspecified: Secondary | ICD-10-CM

## 2021-08-01 DIAGNOSIS — K760 Fatty (change of) liver, not elsewhere classified: Secondary | ICD-10-CM | POA: Diagnosis not present

## 2021-08-01 DIAGNOSIS — K5730 Diverticulosis of large intestine without perforation or abscess without bleeding: Secondary | ICD-10-CM | POA: Diagnosis not present

## 2021-08-01 DIAGNOSIS — N261 Atrophy of kidney (terminal): Secondary | ICD-10-CM | POA: Diagnosis not present

## 2021-08-01 DIAGNOSIS — K862 Cyst of pancreas: Secondary | ICD-10-CM | POA: Diagnosis not present

## 2021-08-01 MED ORDER — GADOBENATE DIMEGLUMINE 529 MG/ML IV SOLN
20.0000 mL | Freq: Once | INTRAVENOUS | Status: AC | PRN
  Administered 2021-08-01: 20 mL via INTRAVENOUS

## 2021-08-10 ENCOUNTER — Ambulatory Visit (INDEPENDENT_AMBULATORY_CARE_PROVIDER_SITE_OTHER): Payer: Medicare Other | Admitting: Internal Medicine

## 2021-08-10 ENCOUNTER — Encounter: Payer: Self-pay | Admitting: Internal Medicine

## 2021-08-10 VITALS — BP 120/72 | HR 74 | Temp 98.4°F | Ht 62.2 in | Wt 219.6 lb

## 2021-08-10 DIAGNOSIS — Z6839 Body mass index (BMI) 39.0-39.9, adult: Secondary | ICD-10-CM

## 2021-08-10 DIAGNOSIS — F419 Anxiety disorder, unspecified: Secondary | ICD-10-CM | POA: Diagnosis not present

## 2021-08-10 DIAGNOSIS — R1013 Epigastric pain: Secondary | ICD-10-CM

## 2021-08-10 DIAGNOSIS — R0989 Other specified symptoms and signs involving the circulatory and respiratory systems: Secondary | ICD-10-CM

## 2021-08-10 MED ORDER — FAMOTIDINE 20 MG PO TABS: 20.0000 mg | ORAL_TABLET | Freq: Every day | ORAL | 1 refills | Status: DC

## 2021-08-10 MED ORDER — SERTRALINE HCL 25 MG PO TABS: 25.0000 mg | ORAL_TABLET | Freq: Every day | ORAL | 2 refills | Status: DC

## 2021-08-10 NOTE — Progress Notes (Addendum)
Jeri Cos [place]ittleton,acting as a Neurosurgeon for [name], MD.,have documented all relevant documentation on the behalf of [name], MD,as directed by  [name], MD while in the presence of [name], MD.  This visit occurred during the SARS-CoV-2 public health emergency.  Safety protocols were in place, including screening questions prior to the visit, additional usage of staff PPE, and extensive cleaning of exam room while observing appropriate contact time as indicated for disinfecting solutions.  Subjective:     Patient ID: Kathryn [name] , female    DOB: Nov 04, 1940 , 81 y.o.   MRN: [id number]   Chief Complaint  Patient presents with   Anxiety    HPI  Patient presents today for a follow on her anxiety. She was started on sertraline at her last visit. She feels the medication is effective. She would like to remain on current dose, 25mg  daily. She has not needed to take any alprazolam since starting the medication.   Anxiety Presents for follow-up visit. Patient reports no nervous/anxious behavior or panic. The quality of sleep is fair. Nighttime awakenings: none.   Compliance with medications is 76-100%. Side effects of treatment include GI discomfort.     Past Medical History:  Diagnosis Date   Anxiety    takes Xanax bid prn   Arthritis    Bruises easily    pt is on Plavix   Chronic back pain    buldging disc   Chronic neck pain    Coronary artery disease    1 stent    Dysphagia    Headache(784.0)    related to cervical issues   History of colonic polyps    Hyperlipidemia    takes Crestor daily   Hypertension    takes Coreg and Diovan daily   Myocardial infarction (HCC) 2019   Osteoporosis    was taking shot 2xyr;but not taking anymore   Peripheral vascular disease (HCC)    Seasonal allergies    takes Zyrtec nightly     Family History  Problem Relation Age of Onset   Heart disease Mother        Heart Dissease before age 80   Heart  attack Mother    Cancer Father    Cancer Sister    Leukemia Sister    Cancer Brother    Heart disease Brother        Amputation   Heart attack Brother    Heart disease Brother    Heart attack Brother    Dementia Other    Anesthesia problems Neg Hx    Hypotension Neg Hx    Malignant hyperthermia Neg Hx    Pseudochol deficiency Neg Hx      Current Outpatient Medications:    acetaminophen (TYLENOL) 325 MG tablet, Take 325 mg by mouth every 6 (six) hours as needed for mild pain., Disp: , Rfl:    amLODipine (NORVASC) 10 MG tablet, Take 1 tablet (10 mg total) by mouth daily., Disp: 90 tablet, Rfl: 2   Apoaequorin (PREVAGEN PO), Take 1 tablet by mouth daily., Disp: , Rfl:    Aspirin 81 MG CAPS, , Disp: , Rfl:    CALCIUM PO, Take 1 tablet by mouth daily. 600 mg, Disp: , Rfl:    carvedilol (COREG) 12.5 MG tablet, TAKE (1) TABLET BY MOUTH TWICE DAILY., Disp: 180 tablet, Rfl: 0   cetirizine (ZYRTEC) 10 MG tablet, Take 10 mg by mouth at bedtime., Disp: , Rfl:  diclofenac Sodium (VOLTAREN) 1 % GEL, Apply 4 g topically 4 (four) times daily., Disp: 150 g, Rfl: 1   ezetimibe (ZETIA) 10 MG tablet, TAKE 1 TABLET BY MOUTH ONCE A DAY., Disp: 30 tablet, Rfl: 0   famotidine (PEPCID) 20 MG tablet, Take 1 tablet (20 mg total) by mouth daily., Disp: 30 tablet, Rfl: 1   furosemide (LASIX) 20 MG tablet, TAKE 1 TABLET BY MOUTH DAILY, Disp: 90 tablet, Rfl: 0   Magnesium 250 MG TABS, Take 1 tablet by mouth daily., Disp: , Rfl:    Multiple Vitamin (MULTIVITAMIN WITH MINERALS) TABS, Take 1 tablet by mouth daily., Disp: , Rfl:    nitroGLYCERIN (NITROSTAT) 0.4 MG SL tablet, Place 1 tablet (0.4 mg total) under the tongue every 5 (five) minutes x 3 doses as needed for chest pain., Disp: 30 tablet, Rfl: 3   pravastatin (PRAVACHOL) 40 MG tablet, Take 1 tablet (40 mg total) by mouth every evening., Disp: 90 tablet, Rfl: 3   pregabalin (LYRICA) 75 MG capsule, TAKE 1 CAPSULE BY MOUTH THREE TIMES A DAY., Disp: 90 capsule,  Rfl: 3   Probiotic Product (PROBIOTIC DAILY PO), Take 1 tablet by mouth daily at 12 noon., Disp: , Rfl:    triamcinolone cream (KENALOG) 0.1 %, Apply 1 application topically 2 (two) times daily., Disp: 30 g, Rfl: 0   valACYclovir (VALTREX) 1000 MG tablet, Take 1 tablet (1,000 mg total) by mouth 3 (three) times daily., Disp: 21 tablet, Rfl: 0   ALPRAZolam (XANAX) 0.25 MG tablet, TAKE (1) TABLET BY MOUTH TWICE DAILY AS NEEDED FOR ANXIETY. (Patient not taking: Reported on 08/10/2021), Disp: 30 tablet, Rfl: 0   gabapentin (NEURONTIN) 300 MG capsule, Take 300 mg by mouth at bedtime., Disp: , Rfl:    sertraline (ZOLOFT) 25 MG tablet, Take 1 tablet (25 mg total) by mouth daily., Disp: 90 tablet, Rfl: 2   Allergies  Allergen Reactions   Codeine Hypertension   Shellfish Allergy      Review of Systems  Constitutional: Negative.   Eyes: Negative.   Respiratory: Negative.    Cardiovascular: Negative.   Gastrointestinal: Negative.        She c/o burning sensation in her stomach. She has been evaluated by Dr. Loreta Ave, was not started on any meds. Denies n/v. States she uses the bathroom a lot, usually shortly after eating. She does not think her sx are related to sertraline. States her sx may have started prior to her starting this medication.   Musculoskeletal: Negative.   Skin: Negative.   Neurological: Negative.   Psychiatric/Behavioral: Negative.  The patient is not nervous/anxious.      Today's Vitals   08/10/21 1205  BP: 120/72  Pulse: 74  Temp: 98.4 F (36.9 C)  Weight: 219 lb 9.6 oz (99.6 kg)  Height: 5' 2.2" (1.58 m)  PainSc: 0-No pain   Body mass index is 39.91 kg/m.  Wt Readings from Last 3 Encounters:  08/10/21 219 lb 9.6 oz (99.6 kg)  06/02/21 220 lb 10.9 oz (100.1 kg)  06/02/21 220 lb 9.6 oz (100.1 kg)     Objective:  Physical Exam Vitals and nursing note reviewed.  Constitutional:      Appearance: Normal appearance. She is obese.  HENT:     Head: Normocephalic and  atraumatic.  Neck:     Vascular: Carotid bruit present.  Cardiovascular:     Rate and Rhythm: Normal rate and regular rhythm.     Heart sounds: Normal heart sounds.  Pulmonary:  Effort: Pulmonary effort is normal.     Breath sounds: Normal breath sounds.  Skin:    General: Skin is warm.  Neurological:     General: No focal deficit present.     Mental Status: She is alert.  Psychiatric:        Mood and Affect: Mood normal.        Behavior: Behavior normal.      Assessment And Plan:     1. Anxiety Comments: Chronic, improved with sertraline 25mg  daily. - sertraline (ZOLOFT) 25 MG tablet; Take 1 tablet (25 mg total) by mouth daily.  Dispense: 90 tablet; Refill: 2  2. Dyspepsia Comments: I will start famotidine once daily. She will f/u at her previously scheduled appt September 13, 2021. Advised to avoid dairy products.   3. Bilateral carotid bruits Comments: LDL goal <70, optimal <55. I will check lipid panel at her next visit. Pt is aware I may adjust her meds.  4. Class 2 severe obesity due to excess calories with serious comorbidity and body mass index (BMI) of 39.0 to 39.9 in adult [facility]) She is encouraged to aim for at least 150 minutes of exercise per week, while striving for BMI<34 to decrease cardiac risk.     Patient was given opportunity to ask questions. Patient verbalized understanding of the plan and was able to repeat key elements of the plan. All questions were answered to their satisfaction.   I, [name], MD, have reviewed all documentation for this visit. The documentation on 08/10/21 for the exam, diagnosis, procedures, and orders are all accurate and complete.   IF YOU HAVE BEEN REFERRED TO A SPECIALIST, IT MAY TAKE 1-2 WEEKS TO SCHEDULE/PROCESS THE REFERRAL. IF YOU HAVE NOT HEARD FROM US/SPECIALIST IN TWO WEEKS, PLEASE GIVE [place] A CALL AT [phone].   THE PATIENT IS ENCOURAGED TO PRACTICE SOCIAL DISTANCING DUE TO THE COVID-19 PANDEMIC.

## 2021-08-12 ENCOUNTER — Telehealth: Payer: Medicare Other

## 2021-08-16 ENCOUNTER — Other Ambulatory Visit: Payer: Self-pay | Admitting: Student

## 2021-08-26 ENCOUNTER — Ambulatory Visit: Payer: Self-pay

## 2021-08-26 ENCOUNTER — Encounter: Payer: Self-pay | Admitting: Nurse Practitioner

## 2021-08-26 ENCOUNTER — Ambulatory Visit (INDEPENDENT_AMBULATORY_CARE_PROVIDER_SITE_OTHER): Payer: Medicare Other | Admitting: Nurse Practitioner

## 2021-08-26 ENCOUNTER — Telehealth: Payer: Medicare Other

## 2021-08-26 VITALS — BP 138/66 | HR 72 | Temp 98.9°F | Ht 62.0 in | Wt 212.8 lb

## 2021-08-26 DIAGNOSIS — I10 Essential (primary) hypertension: Secondary | ICD-10-CM

## 2021-08-26 DIAGNOSIS — M4807 Spinal stenosis, lumbosacral region: Secondary | ICD-10-CM

## 2021-08-26 DIAGNOSIS — Z6838 Body mass index (BMI) 38.0-38.9, adult: Secondary | ICD-10-CM | POA: Diagnosis not present

## 2021-08-26 DIAGNOSIS — I214 Non-ST elevation (NSTEMI) myocardial infarction: Secondary | ICD-10-CM

## 2021-08-26 DIAGNOSIS — R238 Other skin changes: Secondary | ICD-10-CM

## 2021-08-26 DIAGNOSIS — N1832 Chronic kidney disease, stage 3b: Secondary | ICD-10-CM

## 2021-08-26 DIAGNOSIS — E669 Obesity, unspecified: Secondary | ICD-10-CM | POA: Diagnosis not present

## 2021-08-26 DIAGNOSIS — I129 Hypertensive chronic kidney disease with stage 1 through stage 4 chronic kidney disease, or unspecified chronic kidney disease: Secondary | ICD-10-CM

## 2021-08-26 DIAGNOSIS — R7303 Prediabetes: Secondary | ICD-10-CM

## 2021-08-26 MED ORDER — TRIAMCINOLONE ACETONIDE 40 MG/ML IJ SUSP
40.0000 mg | Freq: Once | INTRAMUSCULAR | Status: AC
  Administered 2021-08-26: 40 mg via INTRAMUSCULAR

## 2021-08-26 MED ORDER — TRIAMCINOLONE ACETONIDE 0.1 % EX CREA
1.0000 | TOPICAL_CREAM | Freq: Two times a day (BID) | CUTANEOUS | 0 refills | Status: AC
Start: 2021-08-26 — End: ?

## 2021-08-26 MED ORDER — VALACYCLOVIR HCL 1 G PO TABS: 1000.0000 mg | ORAL_TABLET | Freq: Three times a day (TID) | ORAL | 0 refills | Status: DC

## 2021-08-26 NOTE — Patient Instructions (Signed)
Rash, Adult  A rash is a change in the color of your skin. A rash can also change the way your skin feels. There are many different conditions and factors that can cause a rash. Follow these instructions at home: The goal of treatment is to stop the itching and keep the rash from spreading. Watch for any changes in your symptoms. Let your doctor know about them. Follow these instructions to help with your condition: Medicine Take or apply over-the-counter and prescription medicines only as told by your doctor. These may include medicines: To treat red or swollen skin (corticosteroid creams). To treat itching. To treat an allergy (oral antihistamines). To treat very bad symptoms (oral corticosteroids).  Skin care Put cool cloths (compresses) on the affected areas. Do not scratch or rub your skin. Avoid covering the rash. Make sure that the rash is exposed to air as much as possible. Managing itching and discomfort Avoid hot showers or baths. These can make itching worse. A cold shower may help. Try taking a bath with: Epsom salts. You can get these at your local pharmacy or grocery store. Follow the instructions on the package. Baking soda. Pour a small amount into the bath as told by your doctor. Colloidal oatmeal. You can get this at your local pharmacy or grocery store. Follow the instructions on the package. Try putting baking soda paste onto your skin. Stir water into baking soda until it gets like a paste. Try putting on a lotion that relieves itchiness (calamine lotion). Keep cool and out of the sun. Sweating and being hot can make itching worse. General instructions  Rest as needed. Drink enough fluid to keep your pee (urine) pale yellow. Wear loose-fitting clothing. Avoid scented soaps, detergents, and perfumes. Use gentle soaps, detergents, perfumes, and other cosmetic products. Avoid anything that causes your rash. Keep a journal to help track what causes your rash. Write  down: What you eat. What cosmetic products you use. What you drink. What you wear. This includes jewelry. Keep all follow-up visits as told by your doctor. This is important. Contact a doctor if: You sweat at night. You lose weight. You pee (urinate) more than normal. You pee less than normal, or you notice that your pee is a darker color than normal. You feel weak. You throw up (vomit). Your skin or the whites of your eyes look yellow (jaundice). Your skin: Tingles. Is numb. Your rash: Does not go away after a few days. Gets worse. You are: More thirsty than normal. More tired than normal. You have: New symptoms. Pain in your belly (abdomen). A fever. Watery poop (diarrhea). Get help right away if: You have a fever and your symptoms suddenly get worse. You start to feel mixed up (confused). You have a very bad headache or a stiff neck. You have very bad joint pains or stiffness. You have jerky movements that you cannot control (seizure). Your rash covers all or most of your body. The rash may or may not be painful. You have blisters that: Are on top of the rash. Grow larger. Grow together. Are painful. Are inside your nose or mouth. You have a rash that: Looks like purple pinprick-sized spots all over your body. Has a "bull's eye" or looks like a target. Is red and painful, causes your skin to peel, and is not from being in the sun too long. Summary A rash is a change in the color of your skin. A rash can also change the way your skin   feels. The goal of treatment is to stop the itching and keep the rash from spreading. Take or apply over-the-counter and prescription medicines only as told by your doctor. Contact a doctor if you have new symptoms or symptoms that get worse. Keep all follow-up visits as told by your doctor. This is important. This information is not intended to replace advice given to you by your health care provider. Make sure you discuss any  questions you have with your health care provider. Document Revised: 11/12/2020 Document Reviewed: 11/12/2020 Elsevier Patient Education  2023 [employer]  

## 2021-08-26 NOTE — Patient Instructions (Signed)
Visit Information  Thank you for taking time to visit with me today. Please don't hesitate to contact me if I can be of assistance to you before our next scheduled telephone appointment.  Following are the goals we discussed today:  Take all medications as prescribed Attend all scheduled provider appointments Call pharmacy for medication refills 3-7 days in advance of running out of medications Perform all self care activities independently  Perform IADL's (shopping, preparing meals, housekeeping, managing finances) independently Call provider office for new concerns or questions  keep appointment with eye doctor check feet daily for cuts, sores or redness drink 6 to 8 glasses of water each day manage portion size check blood pressure 3 times per week call doctor for signs and symptoms of high blood pressure take medications for blood pressure exactly as prescribed begin an exercise program report new symptoms to your doctor  Our next appointment is by telephone on 09/23/21 at 09:00 AM   Please call the care guide team at [phone] if you need to cancel or reschedule your appointment.   If you are experiencing a Mental Health or [place] or need someone to talk to, please call [phone]-TALK (toll free, 24 hour hotline)   The patient verbalized understanding of instructions, educational materials, and care plan provided today and agreed to receive a mailed copy of patient instructions, educational materials, and care plan.   Barb Merino, RN, BSN, CCM Care Management Coordinator [place] [facility]  Direct Phone: [phone]

## 2021-08-26 NOTE — Progress Notes (Signed)
Subjective:     Patient ID: Kathryn [name] , female    DOB: [birth year] , 81 y.o.   MRN: [id number]   Chief Complaint  Patient presents with   Rash    HPI  Patient presents she states she a burning, itchy rash on right arm starting Tuesday. She has been using benadryl.   Rash This is a new problem. The current episode started in the past 7 days. The affected locations include the right shoulder, right elbow and right arm. The rash is characterized by itchiness. She was exposed to nothing. Past treatments include antihistamine (Benadryl).     Past Medical History:  Diagnosis Date   Anxiety    takes Xanax bid prn   Arthritis    Bruises easily    pt is on Plavix   Chronic back pain    buldging disc   Chronic neck pain    Coronary artery disease    1 stent    Dysphagia    Headache(784.0)    related to cervical issues   History of colonic polyps    Hyperlipidemia    takes Crestor daily   Hypertension    takes Coreg and Diovan daily   Myocardial infarction ([place]) 2019   Osteoporosis    was taking shot 2xyr;but not taking anymore   Peripheral vascular disease (HCC)    Seasonal allergies    takes Zyrtec nightly     Family History  Problem Relation Age of Onset   Heart disease Mother        Heart Dissease before age 85   Heart attack Mother    Cancer Father    Cancer Sister    Leukemia Sister    Cancer Brother    Heart disease Brother        Amputation   Heart attack Brother    Heart disease Brother    Heart attack Brother    Dementia Other    Anesthesia problems Neg Hx    Hypotension Neg Hx    Malignant hyperthermia Neg Hx    Pseudochol deficiency Neg Hx      Current Outpatient Medications:    acetaminophen (TYLENOL) 325 MG tablet, Take 325 mg by mouth every 6 (six) hours as needed for mild pain., Disp: , Rfl:    ALPRAZolam (XANAX) 0.25 MG tablet, TAKE (1) TABLET BY MOUTH TWICE DAILY AS NEEDED FOR ANXIETY., Disp: 30 tablet, Rfl: 0   amLODipine  (NORVASC) 10 MG tablet, Take 1 tablet (10 mg total) by mouth daily., Disp: 90 tablet, Rfl: 2   Apoaequorin (PREVAGEN PO), Take 1 tablet by mouth daily., Disp: , Rfl:    Aspirin 81 MG CAPS, , Disp: , Rfl:    CALCIUM PO, Take 1 tablet by mouth daily. 600 mg, Disp: , Rfl:    carvedilol (COREG) 12.5 MG tablet, TAKE (1) TABLET BY MOUTH TWICE DAILY., Disp: 180 tablet, Rfl: 0   cetirizine (ZYRTEC) 10 MG tablet, Take 10 mg by mouth at bedtime., Disp: , Rfl:    diclofenac Sodium (VOLTAREN) 1 % GEL, Apply 4 g topically 4 (four) times daily., Disp: 150 g, Rfl: 1   ezetimibe (ZETIA) 10 MG tablet, TAKE 1 TABLET BY MOUTH ONCE A DAY., Disp: 30 tablet, Rfl: 0   famotidine (PEPCID) 20 MG tablet, Take 1 tablet (20 mg total) by mouth daily., Disp: 30 tablet, Rfl: 1   furosemide (LASIX) 20 MG tablet, TAKE 1 TABLET BY MOUTH DAILY, Disp: 90 tablet, Rfl: 0  gabapentin (NEURONTIN) 300 MG capsule, Take 300 mg by mouth at bedtime., Disp: , Rfl:    Magnesium 250 MG TABS, Take 1 tablet by mouth daily., Disp: , Rfl:    Multiple Vitamin (MULTIVITAMIN WITH MINERALS) TABS, Take 1 tablet by mouth daily., Disp: , Rfl:    nitroGLYCERIN (NITROSTAT) 0.4 MG SL tablet, Place 1 tablet (0.4 mg total) under the tongue every 5 (five) minutes x 3 doses as needed for chest pain., Disp: 30 tablet, Rfl: 3   pregabalin (LYRICA) 75 MG capsule, TAKE 1 CAPSULE BY MOUTH THREE TIMES A DAY., Disp: 90 capsule, Rfl: 3   Probiotic Product (PROBIOTIC DAILY PO), Take 1 tablet by mouth daily at 12 noon., Disp: , Rfl:    sertraline (ZOLOFT) 25 MG tablet, Take 1 tablet (25 mg total) by mouth daily., Disp: 90 tablet, Rfl: 2   pravastatin (PRAVACHOL) 40 MG tablet, Take 1 tablet (40 mg total) by mouth every evening., Disp: 90 tablet, Rfl: 3   triamcinolone cream (KENALOG) 0.1 %, Apply 1 Application topically 2 (two) times daily., Disp: 30 g, Rfl: 0   valACYclovir (VALTREX) 1000 MG tablet, Take 1 tablet (1,000 mg total) by mouth 3 (three) times daily., Disp: 21  tablet, Rfl: 0   Allergies  Allergen Reactions   Codeine Hypertension   Shellfish Allergy      Review of Systems  Constitutional: Negative.   HENT: Negative.    Eyes: Negative.   Respiratory: Negative.    Cardiovascular: Negative.   Gastrointestinal: Negative.   Endocrine: Negative.   Genitourinary: Negative.   Musculoskeletal: Negative.   Skin:  Positive for rash.  Allergic/Immunologic: Negative.   Neurological: Negative.   Hematological: Negative.   Psychiatric/Behavioral: Negative.       Today's Vitals   08/26/21 1436  BP: 138/66  Pulse: 72  Temp: 98.9 F (37.2 C)  TempSrc: Oral  Weight: 212 lb 12.8 oz (96.5 kg)  Height: '5\' 2"'$  (1.575 m)  PainSc: 0-No pain   Body mass index is 38.92 kg/m.  Wt Readings from Last 3 Encounters:  08/26/21 212 lb 12.8 oz (96.5 kg)  08/10/21 219 lb 9.6 oz (99.6 kg)  06/02/21 220 lb 10.9 oz (100.1 kg)     Objective:  Physical Exam Vitals reviewed.  Constitutional:      General: She is not in acute distress.    Appearance: Normal appearance. She is obese.  Cardiovascular:     Rate and Rhythm: Normal rate and regular rhythm.     Pulses: Normal pulses.     Heart sounds: Normal heart sounds. No murmur heard. Pulmonary:     Effort: Pulmonary effort is normal. No respiratory distress.     Breath sounds: Normal breath sounds.  Skin:    General: Skin is warm and dry.     Findings: Rash (vesicular type rash posterior upper arm) present.  Neurological:     General: No focal deficit present.     Mental Status: She is alert and oriented to person, place, and time.     Cranial Nerves: No cranial nerve deficit.     Motor: No weakness.  Psychiatric:        Mood and Affect: Mood normal.        Behavior: Behavior normal.        Thought Content: Thought content normal.        Judgment: Judgment normal.         Assessment And Plan:     1. Vesicular rash Comments: Area  resembles shingles rash with vesicles and she is having a  significant amount of pain, will treat as shingles if not better return call to office.  - triamcinolone acetonide (KENALOG-40) injection 40 mg - triamcinolone cream (KENALOG) 0.1 %; Apply 1 Application topically 2 (two) times daily.  Dispense: 30 g; Refill: 0 - valACYclovir (VALTREX) 1000 MG tablet; Take 1 tablet (1,000 mg total) by mouth 3 (three) times daily.  Dispense: 21 tablet; Refill: 0  2. 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 Advised to aim for at least 150 minutes of exercise per week.    Patient was given opportunity to ask questions. Patient verbalized understanding of the plan and was able to repeat key elements of the plan. All questions were answered to their satisfaction.  Minette Brine, FNP   I, Minette Brine, FNP, have reviewed all documentation for this visit. The documentation on 08/26/21 for the exam, diagnosis, procedures, and orders are all accurate and complete.   IF YOU HAVE BEEN REFERRED TO A SPECIALIST, IT MAY TAKE 1-2 WEEKS TO SCHEDULE/PROCESS THE REFERRAL. IF YOU HAVE NOT HEARD FROM US/SPECIALIST IN TWO WEEKS, PLEASE GIVE [place] A CALL AT [phone].   THE PATIENT IS ENCOURAGED TO PRACTICE SOCIAL DISTANCING DUE TO THE COVID-19 PANDEMIC.

## 2021-08-26 NOTE — Chronic Care Management (AMB) (Signed)
Care Management    RN Visit Note  08/26/2021 Name: Kathryn [name] MRN: [id number] DOB: 07-01-1940  Subjective: Kathryn [name] is a 81 y.o. year old female who is a primary care patient of Kathryn Peng, MD. The care management team was consulted for assistance with disease management and care coordination needs.    Engaged with patient by telephone for follow up visit in response to provider referral for case management and/or care coordination services.   Consent to Services:   Kathryn [name] was given information about Care Management services today including:  Care Management services includes personalized support from designated clinical staff supervised by her physician, including individualized plan of care and coordination with other care providers 24/7 contact phone numbers for assistance for urgent and routine care needs. The patient may stop case management services at any time by phone call to the office staff.  Patient agreed to services and consent obtained.   Assessment: Review of patient past medical history, allergies, medications, health status, including review of consultants reports, laboratory and other test data, was performed as part of comprehensive evaluation and provision of chronic care management services.   SDOH (Social Determinants of Health) assessments and interventions performed:  Yes, no acute needs   Care Plan  Allergies  Allergen Reactions   Codeine Hypertension   Shellfish Allergy     Outpatient Encounter Medications as of 08/26/2021  Medication Sig   acetaminophen (TYLENOL) 325 MG tablet Take 325 mg by mouth every 6 (six) hours as needed for mild pain.   ALPRAZolam (XANAX) 0.25 MG tablet TAKE (1) TABLET BY MOUTH TWICE DAILY AS NEEDED FOR ANXIETY. (Patient not taking: Reported on 08/10/2021)   amLODipine (NORVASC) 10 MG tablet Take 1 tablet (10 mg total) by mouth daily.   Apoaequorin (PREVAGEN PO) Take 1 tablet by mouth daily.   Aspirin  81 MG CAPS    CALCIUM PO Take 1 tablet by mouth daily. 600 mg   carvedilol (COREG) 12.5 MG tablet TAKE (1) TABLET BY MOUTH TWICE DAILY.   cetirizine (ZYRTEC) 10 MG tablet Take 10 mg by mouth at bedtime.   diclofenac Sodium (VOLTAREN) 1 % GEL Apply 4 g topically 4 (four) times daily.   ezetimibe (ZETIA) 10 MG tablet TAKE 1 TABLET BY MOUTH ONCE A DAY.   famotidine (PEPCID) 20 MG tablet Take 1 tablet (20 mg total) by mouth daily.   furosemide (LASIX) 20 MG tablet TAKE 1 TABLET BY MOUTH DAILY   gabapentin (NEURONTIN) 300 MG capsule Take 300 mg by mouth at bedtime.   Magnesium 250 MG TABS Take 1 tablet by mouth daily.   Multiple Vitamin (MULTIVITAMIN WITH MINERALS) TABS Take 1 tablet by mouth daily.   nitroGLYCERIN (NITROSTAT) 0.4 MG SL tablet Place 1 tablet (0.4 mg total) under the tongue every 5 (five) minutes x 3 doses as needed for chest pain.   pravastatin (PRAVACHOL) 40 MG tablet Take 1 tablet (40 mg total) by mouth every evening.   pregabalin (LYRICA) 75 MG capsule TAKE 1 CAPSULE BY MOUTH THREE TIMES A DAY.   Probiotic Product (PROBIOTIC DAILY PO) Take 1 tablet by mouth daily at 12 noon.   sertraline (ZOLOFT) 25 MG tablet Take 1 tablet (25 mg total) by mouth daily.   triamcinolone cream (KENALOG) 0.1 % Apply 1 application topically 2 (two) times daily.   valACYclovir (VALTREX) 1000 MG tablet Take 1 tablet (1,000 mg total) by mouth 3 (three) times daily.   No facility-administered encounter medications on file  as of 08/26/2021.    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   NSTEMI (non-ST elevated myocardial infarction) ([place]) 01/14/2018   Essential hypertension 01/14/2018   Hyperlipidemia 01/14/2018   Carotid stenosis, asymptomatic, bilateral 09/06/2012    Conditions to be addressed/monitored:  CKD III, Hypertensive nephropathy, prediabetes, NSTEMI, Essential HTN, Spinal stenosis of lumbosacral region  Care Plan : RN Care Manager Plan of Care  Updates made by Lynne Logan, RN since 08/26/2021 12:00 AM  Completed 08/26/2021   Problem: No Plan of Care established for mangaement of chronic disease states (CKD III, Hypertensive nephropathy, prediabetes, NSTEMI, Essential HTN, Spinal stenosis of lumbosacral region) Resolved 08/26/2021  Priority: High     Long-Range Goal: Establishment of plan of care for management of chronic disease states (CKD III, Hypertensive nephropathy, prediabetes, NSTEMI, Essential HTN, Spinal stenosis of lumbosacral region) Completed 08/26/2021  Start Date: 04/14/2021  Expected End Date: 04/15/2022  Recent Progress: On track  Priority: High  Note:   Current Barriers:  Knowledge Deficits related to plan of care for management of CKD III, Hypertensive nephropathy, prediabetes, NSTEMI, Essential HTN, Spinal stenosis of lumbosacral region  Chronic Disease Management support and education needs related to CKD III, Hypertensive nephropathy, prediabetes, NSTEMI, Essential HTN, Spinal stenosis of lumbosacral region   RNCM Clinical Goal(s):  Patient will verbalize basic understanding of  CKD III, Hypertensive nephropathy, prediabetes, NSTEMI, Essential HTN, Spinal stenosis of lumbosacral region disease process and self health management plan as evidenced by patient will experience having no disease exacerbations related to her chronic disease states as listed above  take all medications exactly as prescribed and will call provider for medication related questions as evidenced by patient will  demonstrate improved understanding of prescribed medications and rationale for usage as evidenced by patient teach back demonstrate Improved health management independence as evidenced by patient will report 100% adherence to her prescribed treatment plan  continue to work with RN Care Manager to address care management and care coordination needs related to  CKD III, Hypertensive nephropathy, prediabetes, NSTEMI, Essential HTN, Spinal stenosis of lumbosacral region as evidenced by adherence to CM Team Scheduled appointments demonstrate ongoing self health care management ability   as evidenced by    through collaboration with RN Care manager, provider, and care team.   Interventions: 1:1 collaboration with primary care provider regarding development and update of comprehensive plan of care as evidenced by provider attestation and co-signature Inter-disciplinary care team collaboration (see longitudinal plan of care) Evaluation of current treatment plan related to  self management and patient's adherence to plan as established by provider  Cataract Surgery:  (Status:  Goal Met.)  Short Term Goal Evaluation of current treatment plan related to  Cataract Surgery , self-management and patient's adherence to plan as established by provider Completed successful outbound call with patient Determined patient underwent right Cataract surgery with good results Determined patient is adhering to her prescribed eye drops as directed with good results, reports no pain and feels visual acuity has improved Reviewed scheduled/upcoming provider appointments including: post  Cataract follow up  Discussed plans with patient for ongoing care management follow up and provided patient with direct contact information for care management team   Hypertension Interventions:  (Status:  Goal Met.) Long Term Goal Last practice recorded BP readings:  BP Readings from Last 3 Encounters:  08/10/21 120/72  06/02/21 138/62   06/02/21 122/60  Most recent eGFR/CrCl:  Lab Results  Component Value Date   EGFR 49 (L) 06/02/2021    No components found for: "CRCL" Evaluation of current treatment plan related to hypertension self management and patient's adherence to plan as established by provider Review of patient status, including review of consultant's reports, relevant laboratory and other test results Provided education on prescribed diet low Sodium     Chronic Kidney Disease Interventions:  (Status:  Goal Met.) Long Term Goal Assessed the Patient understanding of chronic kidney disease    Evaluation of current treatment plan related to chronic kidney disease self management and patient's adherence to plan as established by provider      Reviewed prescribed diet continue to increase water to 48-64 oz daily  Discussed complications of poorly controlled blood pressure such as heart disease, stroke, circulatory complications, vision complications, kidney impairment, sexual dysfunction    Provided education on kidney disease progression    Engage patient in early, proactive and ongoing discussion about goals of care and what matters most to them    Last practice recorded BP readings:  BP Readings from Last 3 Encounters:  06/02/21 138/62  06/02/21 122/60  05/19/21 130/70  Most recent eGFR/CrCl:  Lab Results  Component Value Date   EGFR 49 (L) 06/02/2021    No components found for: CRCL  Pain Interventions:  (Status:  Goal Met.) Long Term Goal Pain assessment performed Medications reviewed Reviewed provider established plan for pain management Discussed importance of adherence to all scheduled medical appointments Counseled on the importance of reporting any/all new or changed pain symptoms or management strategies to pain management provider Advised patient to report to care team affect of pain on daily activities Discussed use of relaxation techniques and/or diversional activities to assist with pain  reduction (distraction, imagery, relaxation, massage, acupressure, TENS, heat, and cold application Reviewed with patient prescribed pharmacological and nonpharmacological pain relief strategies Advised patient to discuss persistent or worsening symptoms with provider Assessed social determinant of health barriers Reviewed scheduled/upcoming provider appointments including: Ortho evaluation for PT scheduled for 04/27/21 $RemoveBef'@[id number]$ :37 PM  Mailed printed educational materials related to Chair Exercises  Hyperlipidemia Interventions:  (Status:  Goal Met.) Long Term Goal Medication review performed; medication list updated in electronic medical record.  Provider established cholesterol goals reviewed Counseled on importance of regular laboratory monitoring as prescribed Provided HLD educational materials Reviewed role and benefits of statin for ASCVD risk reduction Reviewed importance of limiting foods high in cholesterol Reviewed exercise goals and target of 150 minutes per week Mailed printed educational materials related to Cholesterol Management  Lipid Panel     Component Value Date/Time   CHOL 206 (H) 02/16/2021 1505   TRIG 86 02/16/2021 1505   HDL 65 02/16/2021 1505   CHOLHDL 3.0 02/13/2020 1602   LDLCALC 126 (H) 02/16/2021 1505   LABVLDL 15 02/16/2021 1505     Diabetes Interventions:  (Status:  Goal Met.) Long Term Goal Assessed patient's understanding of A1c goal:  <5.7 % Provided education to patient about basic DM disease process Review of patient status, including review of consultants reports, relevant laboratory and other test results, and medications completed  Educated patient on dietary and exercise recommendations Mailed printed educational materials related to Diabetes management  Lab Results  Component Value Date   HGBA1C 5.8 (H) 06/02/2021   Grief Interventions:  (Status:  Goal Met.)  Long Term Goal Evaluation of current treatment plan related to  Grief , self-management  and patient's adherence to plan as established by provider Determined patient is experiencing grief following the loss of 2 close family member's, her brother and her nephew who was like a son to her Determined PCP prescribed sertraline, 25mg  daily. Educated patient on how to take this medication, dose in the evenings, educated on possible SE. Reviewed PCP recommendation for F/u in 6-8 wks.  Provided active listening to patient and validated her feelings of grief and despair, educated patient on the benefits of grief counseling, discussed patient will consider reaching out to [facility] for grief counseling  Encouraged patient to keep PCP well informed of how she is feeling over the upcoming weeks after starting Sertraline Discussed plans with patient for ongoing care management follow up and provided patient with direct contact information for care management team  Patient Goals/Self-Care Activities: Take all medications as prescribed Attend all scheduled provider appointments Call pharmacy for medication refills 3-7 days in advance of running out of medications Perform all self care activities independently  Perform IADL's (shopping, preparing meals, housekeeping, managing finances) independently Call provider office for new concerns or questions  keep appointment with eye doctor check feet daily for cuts, sores or redness drink 6 to 8 glasses of water each day manage portion size check blood pressure 3 times per week call doctor for signs and symptoms of high blood pressure take medications for blood pressure exactly as prescribed begin an exercise program report new symptoms to your doctor       Barb Merino, RN, BSN, CCM Care Management Coordinator [facility]  Direct Phone: [phone]

## 2021-08-30 ENCOUNTER — Encounter: Payer: Self-pay | Admitting: Nurse Practitioner

## 2021-09-13 ENCOUNTER — Encounter: Payer: Self-pay | Admitting: Internal Medicine

## 2021-09-13 ENCOUNTER — Ambulatory Visit (INDEPENDENT_AMBULATORY_CARE_PROVIDER_SITE_OTHER): Payer: Medicare Other | Admitting: Internal Medicine

## 2021-09-13 ENCOUNTER — Other Ambulatory Visit: Payer: Self-pay | Admitting: Student

## 2021-09-13 VITALS — BP 124/80 | HR 72 | Temp 98.6°F | Ht 62.0 in | Wt 214.6 lb

## 2021-09-13 DIAGNOSIS — M4807 Spinal stenosis, lumbosacral region: Secondary | ICD-10-CM | POA: Diagnosis not present

## 2021-09-13 DIAGNOSIS — N1832 Chronic kidney disease, stage 3b: Secondary | ICD-10-CM

## 2021-09-13 DIAGNOSIS — R7303 Prediabetes: Secondary | ICD-10-CM

## 2021-09-13 DIAGNOSIS — Z6839 Body mass index (BMI) 39.0-39.9, adult: Secondary | ICD-10-CM

## 2021-09-13 DIAGNOSIS — I1310 Hypertensive heart and chronic kidney disease without heart failure, with stage 1 through stage 4 chronic kidney disease, or unspecified chronic kidney disease: Secondary | ICD-10-CM | POA: Diagnosis not present

## 2021-09-13 DIAGNOSIS — I25118 Atherosclerotic heart disease of native coronary artery with other forms of angina pectoris: Secondary | ICD-10-CM

## 2021-09-13 MED ORDER — TIZANIDINE HCL 4 MG PO TABS: 4.0000 mg | ORAL_TABLET | Freq: Every evening | ORAL | 1 refills | Status: DC | PRN

## 2021-09-13 NOTE — Progress Notes (Signed)
Rich Brave [place],acting as a Education administrator for [name], MD.,have documented all relevant documentation on the behalf of [name], MD,as directed by  [name], MD while in the presence of [name], MD.    Subjective:     Patient ID: Kathryn [name] , female    DOB: [birth year] , 81 y.o.   MRN: [id number]   Chief Complaint  Patient presents with   Hypertension    HPI  Patient presents today for a bp check. Patient reports compliance with her meds. She denies headaches, chest pain and shortness of breath.   She is having issues with her back. Has h/o spinal stenosis and sciatica. Previously seen by Dr. Mina Marble, has had steroid injections in her back previously. Denies having any LE weakness and paresthesias. Feels more stiff today upon awakening.   Hypertension This is a chronic problem. The current episode started more than 1 year ago. The problem has been gradually improving since onset. The problem is controlled. Pertinent negatives include no blurred vision, chest pain, palpitations or shortness of breath. Risk factors for coronary artery disease include dyslipidemia, obesity, post-menopausal state and sedentary lifestyle. The current treatment provides moderate improvement. Compliance problems include exercise.  Hypertensive end-organ damage includes kidney disease and CAD/MI.     Past Medical History:  Diagnosis Date   Anxiety    takes Xanax bid prn   Arthritis    Bruises easily    pt is on Plavix   Chronic back pain    buldging disc   Chronic neck pain    Coronary artery disease    1 stent    Dysphagia    Headache(784.0)    related to cervical issues   History of colonic polyps    Hyperlipidemia    takes Crestor daily   Hypertension    takes Coreg and Diovan daily   Myocardial infarction ([place]) 2019   Osteoporosis    was taking shot 2xyr;but not taking anymore   Peripheral vascular disease (HCC)    Seasonal allergies    takes Zyrtec nightly      Family History  Problem Relation Age of Onset   Heart disease Mother        Heart Dissease before age 95   Heart attack Mother    Cancer Father    Cancer Sister    Leukemia Sister    Cancer Brother    Heart disease Brother        Amputation   Heart attack Brother    Heart disease Brother    Heart attack Brother    Dementia Other    Anesthesia problems Neg Hx    Hypotension Neg Hx    Malignant hyperthermia Neg Hx    Pseudochol deficiency Neg Hx      Current Outpatient Medications:    acetaminophen (TYLENOL) 325 MG tablet, Take 325 mg by mouth every 6 (six) hours as needed for mild pain., Disp: , Rfl:    ALPRAZolam (XANAX) 0.25 MG tablet, TAKE (1) TABLET BY MOUTH TWICE DAILY AS NEEDED FOR ANXIETY., Disp: 30 tablet, Rfl: 0   amLODipine (NORVASC) 10 MG tablet, Take 1 tablet (10 mg total) by mouth daily., Disp: 90 tablet, Rfl: 2   Apoaequorin (PREVAGEN PO), Take 1 tablet by mouth daily., Disp: , Rfl:    Aspirin 81 MG CAPS, , Disp: , Rfl:    CALCIUM PO, Take 1 tablet by mouth daily. 600 mg, Disp: , Rfl:    carvedilol (  COREG) 12.5 MG tablet, TAKE (1) TABLET BY MOUTH TWICE DAILY., Disp: 180 tablet, Rfl: 0   cetirizine (ZYRTEC) 10 MG tablet, Take 10 mg by mouth at bedtime., Disp: , Rfl:    diclofenac Sodium (VOLTAREN) 1 % GEL, Apply 4 g topically 4 (four) times daily., Disp: 150 g, Rfl: 1   ezetimibe (ZETIA) 10 MG tablet, TAKE 1 TABLET BY MOUTH ONCE A DAY., Disp: 30 tablet, Rfl: 0   famotidine (PEPCID) 20 MG tablet, Take 1 tablet (20 mg total) by mouth daily., Disp: 30 tablet, Rfl: 1   furosemide (LASIX) 20 MG tablet, TAKE 1 TABLET BY MOUTH DAILY, Disp: 90 tablet, Rfl: 0   Magnesium 250 MG TABS, Take 1 tablet by mouth daily., Disp: , Rfl:    Multiple Vitamin (MULTIVITAMIN WITH MINERALS) TABS, Take 1 tablet by mouth daily., Disp: , Rfl:    nitroGLYCERIN (NITROSTAT) 0.4 MG SL tablet, Place 1 tablet (0.4 mg total) under the tongue every 5 (five) minutes x 3 doses as needed for chest  pain., Disp: 30 tablet, Rfl: 3   pravastatin (PRAVACHOL) 40 MG tablet, Take 1 tablet (40 mg total) by mouth every evening., Disp: 90 tablet, Rfl: 3   pregabalin (LYRICA) 75 MG capsule, TAKE 1 CAPSULE BY MOUTH THREE TIMES A DAY., Disp: 90 capsule, Rfl: 3   Probiotic Product (PROBIOTIC DAILY PO), Take 1 tablet by mouth daily at 12 noon., Disp: , Rfl:    sertraline (ZOLOFT) 25 MG tablet, Take 1 tablet (25 mg total) by mouth daily., Disp: 90 tablet, Rfl: 2   tiZANidine (ZANAFLEX) 4 MG tablet, Take 1 tablet (4 mg total) by mouth at bedtime as needed for muscle spasms., Disp: 30 tablet, Rfl: 1   triamcinolone cream (KENALOG) 0.1 %, Apply 1 Application topically 2 (two) times daily., Disp: 30 g, Rfl: 0   Allergies  Allergen Reactions   Codeine Hypertension   Shellfish Allergy      Review of Systems  Constitutional: Negative.   Eyes:  Negative for blurred vision.  Respiratory: Negative.  Negative for shortness of breath.   Cardiovascular: Negative.  Negative for chest pain and palpitations.  Gastrointestinal: Negative.   Musculoskeletal:  Positive for arthralgias and back pain.  Neurological: Negative.   Psychiatric/Behavioral: Negative.       Today's Vitals   09/13/21 1059  BP: 124/80  Pulse: 72  Temp: 98.6 F (37 C)  Weight: 214 lb 9.6 oz (97.3 kg)  Height: $Remove'5\' 2"'tCSILCp$  (1.575 m)  PainSc: 0-No pain   Body mass index is 39.25 kg/m.  Wt Readings from Last 3 Encounters:  09/13/21 214 lb 9.6 oz (97.3 kg)  08/26/21 212 lb 12.8 oz (96.5 kg)  08/10/21 219 lb 9.6 oz (99.6 kg)     Objective:  Physical Exam Vitals and nursing note reviewed.  Constitutional:      Appearance: Normal appearance. She is obese.  HENT:     Head: Normocephalic and atraumatic.  Cardiovascular:     Rate and Rhythm: Normal rate and regular rhythm.     Heart sounds: Normal heart sounds.  Pulmonary:     Effort: Pulmonary effort is normal.     Breath sounds: Normal breath sounds.  Musculoskeletal:     Cervical  back: Normal range of motion.  Skin:    General: Skin is warm.  Neurological:     General: No focal deficit present.     Mental Status: She is alert.  Psychiatric:        Mood and  Affect: Mood normal.        Behavior: Behavior normal.         Assessment And Plan:     1. Hypertensive heart and renal disease with renal failure, stage 1 through stage 4 or unspecified chronic kidney disease, without heart failure Comments: Chronic, well controlled. She is encouraged to follow low sodium diet.  She will c/w amlodipine, furosemide and carvedilol twice daily. - Amb Referral To Provider Referral Exercise Program (P.R.E.P)  2. Coronary artery disease of native artery of native heart with stable angina pectoris ([place]) Comments: Chronic, encouraged to follow heart healthy diet.  - Amb Referral To Provider Referral Exercise Program (P.R.E.P)  3. Stage 3b chronic kidney disease (HCC) Comments: Chronic, encouraged to avoid NSAIDs, stay hydrated and keep BP controlled to decrease risk of CKD progression. I will check Renal labs as below.  - CBC no Diff - CMP14+EGFR - PTH, intact and calcium - Phosphorus - Protein electrophoresis, serum  4. Spinal stenosis of lumbosacral region Comments: Chronic, she was given rx tizanidine to use nightly prn.   5. Prediabetes Comments: Her a1c/glucose has been elevated in the past. I will recheck an a1c today. She is encouraged to  limit her intake of sugary beverages and foods.  - Hemoglobin A1c  6. Class 2 severe obesity due to excess calories with serious comorbidity and body mass index (BMI) of 39.0 to 39.9 in adult [facility]) Comments: She is agreeable to referral to PREP program.  - Amb Referral To Provider Referral Exercise Program (P.R.E.P)   Patient was given opportunity to ask questions. Patient verbalized understanding of the plan and was able to repeat key elements of the plan. All questions were answered to their satisfaction.   I, [name], MD, have reviewed all documentation for this visit. The documentation on 09/18/21 for the exam, diagnosis, procedures, and orders are all accurate and complete.   IF YOU HAVE BEEN REFERRED TO A SPECIALIST, IT MAY TAKE 1-2 WEEKS TO SCHEDULE/PROCESS THE REFERRAL. IF YOU HAVE NOT HEARD FROM US/SPECIALIST IN TWO WEEKS, PLEASE GIVE [place] A CALL AT [phone] X [phone].   THE PATIENT IS ENCOURAGED TO PRACTICE SOCIAL DISTANCING DUE TO THE COVID-19 PANDEMIC.

## 2021-09-13 NOTE — Patient Instructions (Signed)
Hypertension, Adult ?Hypertension is another name for high blood pressure. High blood pressure forces your heart to work harder to pump blood. This can cause problems over time. ?There are two numbers in a blood pressure reading. There is a top number (systolic) over a bottom number (diastolic). It is best to have a blood pressure that is below 120/80. ?What are the causes? ?The cause of this condition is not known. Some other conditions can lead to high blood pressure. ?What increases the risk? ?Some lifestyle factors can make you more likely to develop high blood pressure: ?Smoking. ?Not getting enough exercise or physical activity. ?Being overweight. ?Having too much fat, sugar, calories, or salt (sodium) in your diet. ?Drinking too much alcohol. ?Other risk factors include: ?Having any of these conditions: ?Heart disease. ?Diabetes. ?High cholesterol. ?Kidney disease. ?Obstructive sleep apnea. ?Having a family history of high blood pressure and high cholesterol. ?Age. The risk increases with age. ?Stress. ?What are the signs or symptoms? ?High blood pressure may not cause symptoms. Very high blood pressure (hypertensive crisis) may cause: ?Headache. ?Fast or uneven heartbeats (palpitations). ?Shortness of breath. ?Nosebleed. ?Vomiting or feeling like you may vomit (nauseous). ?Changes in how you see. ?Very bad chest pain. ?Feeling dizzy. ?Seizures. ?How is this treated? ?This condition is treated by making healthy lifestyle changes, such as: ?Eating healthy foods. ?Exercising more. ?Drinking less alcohol. ?Your doctor may prescribe medicine if lifestyle changes do not help enough and if: ?Your top number is above 130. ?Your bottom number is above 80. ?Your personal target blood pressure may vary. ?Follow these instructions at home: ?Eating and drinking ? ?If told, follow the DASH eating plan. To follow this plan: ?Fill one half of your plate at each meal with fruits and vegetables. ?Fill one fourth of your plate  at each meal with whole grains. Whole grains include whole-wheat pasta, brown rice, and whole-grain bread. ?Eat or drink low-fat dairy products, such as skim milk or low-fat yogurt. ?Fill one fourth of your plate at each meal with low-fat (lean) proteins. Low-fat proteins include fish, chicken without skin, eggs, beans, and tofu. ?Avoid fatty meat, cured and processed meat, or chicken with skin. ?Avoid pre-made or processed food. ?Limit the amount of salt in your diet to less than 1,500 mg each day. ?Do not drink alcohol if: ?Your doctor tells you not to drink. ?You are pregnant, may be pregnant, or are planning to become pregnant. ?If you drink alcohol: ?Limit how much you have to: ?0-1 drink a day for women. ?0-2 drinks a day for men. ?Know how much alcohol is in your drink. In the [place] one drink equals one 12 oz bottle of beer (355 mL), one 5 oz glass of wine (148 mL), or one 1? oz glass of hard liquor (44 mL). ?Lifestyle ? ?Work with your doctor to stay at a healthy weight or to lose weight. Ask your doctor what the best weight is for you. ?Get at least 30 minutes of exercise that causes your heart to beat faster (aerobic exercise) most days of the week. This may include walking, swimming, or biking. ?Get at least 30 minutes of exercise that strengthens your muscles (resistance exercise) at least 3 days a week. This may include lifting weights or doing Pilates. ?Do not smoke or use any products that contain nicotine or tobacco. If you need help quitting, ask your doctor. ?Check your blood pressure at home as told by your doctor. ?Keep all follow-up visits. ?Medicines ?Take over-the-counter and prescription medicines   only as told by your doctor. Follow directions carefully. ?Do not skip doses of blood pressure medicine. The medicine does not work as well if you skip doses. Skipping doses also puts you at risk for problems. ?Ask your doctor about side effects or reactions to medicines that you should watch  for. ?Contact a doctor if: ?You think you are having a reaction to the medicine you are taking. ?You have headaches that keep coming back. ?You feel dizzy. ?You have swelling in your ankles. ?You have trouble with your vision. ?Get help right away if: ?You get a very bad headache. ?You start to feel mixed up (confused). ?You feel weak or numb. ?You feel faint. ?You have very bad pain in your: ?Chest. ?Belly (abdomen). ?You vomit more than once. ?You have trouble breathing. ?These symptoms may be an emergency. Get help right away. Call 911. ?Do not wait to see if the symptoms will go away. ?Do not drive yourself to the hospital. ?Summary ?Hypertension is another name for high blood pressure. ?High blood pressure forces your heart to work harder to pump blood. ?For most people, a normal blood pressure is less than 120/80. ?Making healthy choices can help lower blood pressure. If your blood pressure does not get lower with healthy choices, you may need to take medicine. ?This information is not intended to replace advice given to you by your health care provider. Make sure you discuss any questions you have with your health care provider. ?Document Revised: 11/19/2020 Document Reviewed: 11/19/2020 ?Elsevier Patient Education ? 2023 [employer] ? ?

## 2021-09-14 ENCOUNTER — Telehealth: Payer: Self-pay

## 2021-09-14 NOTE — Telephone Encounter (Signed)
She called me inquiring about PREP program, recommended by Dr. Baird Cancer; explained program, she would like to attend at [facility] [place], next class in August; will have Bev, RN [facility] contact her later this month when that class is scheduled.

## 2021-09-15 LAB — CMP14+EGFR
ALT: 11 IU/L (ref 0–32)
AST: 16 IU/L (ref 0–40)
Albumin/Globulin Ratio: 1.9 (ref 1.2–2.2)
Albumin: 4.2 g/dL (ref 3.7–4.7)
Alkaline Phosphatase: 140 IU/L — ABNORMAL HIGH (ref 44–121)
BUN/Creatinine Ratio: 15 (ref 12–28)
BUN: 17 mg/dL (ref 8–27)
Bilirubin Total: 0.3 mg/dL (ref 0.0–1.2)
CO2: 22 mmol/L (ref 20–29)
Calcium: 9.4 mg/dL (ref 8.7–10.3)
Chloride: 105 mmol/L (ref 96–106)
Creatinine, Ser: 1.13 mg/dL — ABNORMAL HIGH (ref 0.57–1.00)
Globulin, Total: 2.2 g/dL (ref 1.5–4.5)
Glucose: 93 mg/dL (ref 70–99)
Potassium: 5 mmol/L (ref 3.5–5.2)
Sodium: 143 mmol/L (ref 134–144)
Total Protein: 6.4 g/dL (ref 6.0–8.5)
eGFR: 49 mL/min/{1.73_m2} — ABNORMAL LOW (ref >59)

## 2021-09-15 LAB — CBC
Hematocrit: 38.9 % (ref 34.0–46.6)
Hemoglobin: 11.8 g/dL (ref 11.1–15.9)
MCH: 22.7 pg — ABNORMAL LOW (ref 26.6–33.0)
MCHC: 30.3 g/dL — ABNORMAL LOW (ref 31.5–35.7)
MCV: 75 fL — ABNORMAL LOW (ref 79–97)
Platelets: 428 10*3/uL (ref 150–450)
RBC: 5.2 x10E6/uL (ref 3.77–5.28)
RDW: 16 % — ABNORMAL HIGH (ref 11.7–15.4)
WBC: 8.9 10*3/uL (ref 3.4–10.8)

## 2021-09-15 LAB — PTH, INTACT AND CALCIUM: PTH: 37 pg/mL (ref 15–65)

## 2021-09-15 LAB — PHOSPHORUS: Phosphorus: 3.6 mg/dL (ref 3.0–4.3)

## 2021-09-15 LAB — PROTEIN ELECTROPHORESIS, SERUM
A/G Ratio: 1.2 (ref 0.7–1.7)
Albumin ELP: 3.5 g/dL (ref 2.9–4.4)
Alpha 1: 0.2 g/dL (ref 0.0–0.4)
Alpha 2: 0.9 g/dL (ref 0.4–1.0)
Beta: 0.9 g/dL (ref 0.7–1.3)
Gamma Globulin: 0.9 g/dL (ref 0.4–1.8)
Globulin, Total: 2.9 g/dL (ref 2.2–3.9)

## 2021-09-15 LAB — HEMOGLOBIN A1C
Est. average glucose Bld gHb Est-mCnc: 114 mg/dL
Hgb A1c MFr Bld: 5.6 % (ref 4.8–5.6)

## 2021-09-20 ENCOUNTER — Telehealth: Payer: Self-pay | Admitting: *Deleted

## 2021-09-20 ENCOUNTER — Other Ambulatory Visit: Payer: Self-pay | Admitting: Internal Medicine

## 2021-09-20 NOTE — Telephone Encounter (Signed)
Called to offer August PREP class at [facility]., left voice mail. Patient returned call and accepted Tues/Thurs. [phone] beginning 10/05/2021. Intake assessment scheduled 09/30/2021 at 1300.

## 2021-09-24 ENCOUNTER — Ambulatory Visit: Payer: Self-pay

## 2021-09-24 NOTE — Patient Outreach (Signed)
  Care Coordination   Follow Up Visit Note   09/24/2021 Name: Kathryn [name] MRN: [id number] DOB: 09/21/1940  Kathryn [name] is a 81 y.o. year old female who sees Glendale Chard, MD for primary care. I spoke with  Kathryn [name] by phone today  What matters to the patients health and wellness today?  Patient would like to discuss her leg cramps with her cardiologist at next scheduled visit.     Goals Addressed       Patient Stated     I am having severe muscle cramps that I need evaluated (pt-stated)        Care Coordination Interventions: Provider established cholesterol goals reviewed Reviewed role and benefits of statin for ASCVD risk reduction Discussed strategies to manage statin-induced myalgias Reviewed medications with patient and educated on potential SE related to Ezetimibe  Instructed patient to discuss her symptoms with Dr. Nadyne Coombes during upcoming Cardiology appointment scheduled for 10/11/21 '@1'$ :30 PM Educated patient regarding the lipid clinic Confirmed patient will participate in the PREP program in the upcoming weeks        COMPLETED: I have a rash on my arm that needs to get checked out (pt-stated)        Patient scheduled to see PCP provider Minette Brine FNP on 08/26/21 for evaluation/treatment of rash on right upper arm.    SDOH assessments and interventions completed:  Yes     Care Coordination Interventions Activated:  Yes  Care Coordination Interventions:  Yes, provided   Follow up plan: Follow up call scheduled for 10/25/21 '@09'$ :15 AM     Encounter Outcome:  Pt. Visit Completed

## 2021-09-24 NOTE — Progress Notes (Signed)
This encounter was created in error - please disregard.

## 2021-09-24 NOTE — Patient Instructions (Signed)
Visit Information  Thank you for taking time to visit with me today. Please don't hesitate to contact me if I can be of assistance to you.   Following are the goals we discussed today:   Goals Addressed       Patient Stated     I am having severe muscle cramps that I need evaluated (pt-stated)        Care Coordination Interventions: Provider established cholesterol goals reviewed Reviewed role and benefits of statin for ASCVD risk reduction Discussed strategies to manage statin-induced myalgias Reviewed medications with patient and educated on potential SE related to Ezetimibe  Instructed patient to discuss her symptoms with Dr. Nadyne Coombes during upcoming Cardiology appointment scheduled for 10/11/21 '@1'$ :30 PM Educated patient regarding the lipid clinic Confirmed patient will participate in the PREP program in the upcoming weeks        COMPLETED: I have a rash on my arm that needs to get checked out (pt-stated)        Patient scheduled to see PCP provider Minette Brine FNP on 08/26/21 for evaluation/treatment of rash on right upper arm.    Our next appointment is by telephone on 10/25/21 at 09:15 AM   Please call the care guide team at [phone] if you need to cancel or reschedule your appointment.   If you are experiencing a Mental Health or [place] or need someone to talk to, please call [phone]-TALK (toll free, 24 hour hotline)  The patient verbalized understanding of instructions, educational materials, and care plan provided today and agreed to receive a mailed copy of patient instructions, educational materials, and care plan.   Barb Merino, RN, BSN, CCM Care Management Coordinator [facility] Care Management Direct Phone: [phone]

## 2021-09-28 ENCOUNTER — Encounter: Payer: Self-pay | Admitting: Emergency Medicine

## 2021-09-28 ENCOUNTER — Ambulatory Visit
Admission: EM | Admit: 2021-09-28 | Discharge: 2021-09-28 | Disposition: A | Discharge status: Home / Self Care | Payer: Medicare Other | Attending: Nurse Practitioner | Admitting: Nurse Practitioner

## 2021-09-28 DIAGNOSIS — J0140 Acute pansinusitis, unspecified: Secondary | ICD-10-CM | POA: Diagnosis not present

## 2021-09-28 MED ORDER — GUAIFENESIN 100 MG/5ML PO LIQD: 5.0000 mL | Freq: Four times a day (QID) | ORAL | 0 refills | Status: AC | PRN

## 2021-09-28 MED ORDER — AMOXICILLIN-POT CLAVULANATE 875-125 MG PO TABS: 1.0000 | ORAL_TABLET | Freq: Two times a day (BID) | ORAL | 0 refills | Status: DC

## 2021-09-28 MED ORDER — FLUTICASONE PROPIONATE 50 MCG/ACT NA SUSP: 2.0000 | Freq: Every day | NASAL | 0 refills | Status: AC

## 2021-09-28 NOTE — Discharge Instructions (Addendum)
Take medication as directed. Increase fluids and get plenty of rest. May take over-the-counter iTylenol as needed for pain, fever, or general discomfort. Recommend normal saline nasal spray to help with nasal congestion throughout the day. For your cough, it may be helpful to use a humidifier at bedtime during sleep. If your symptoms fail to improve within the next 7 to 10 days, please follow-up with your primary care physician.

## 2021-09-28 NOTE — ED Provider Notes (Signed)
[facility]    CSN: [id number] Arrival date & time: 09/28/21  1411      History   Chief Complaint No chief complaint on file.   HPI Kathryn [name] is a 81 y.o. female.   The history is provided by the patient.   Patient presents for complaints of upper respiratory symptoms that been present for more than 1 week.  She states approximately 4 to 5 days ago, she developed pain on the left side of her face and in the left ear.  She also complains of cough that is productive of yellow sputum.  She states that she also has had nasal congestion, runny nose, and intermittent pain in her teeth on the left side of her face.  She denies fever, chills, headache, sore throat, wheezing, shortness of breath, or GI symptoms.  Patient states she has been taking Tylenol for her symptoms.  She states over the past several days, her symptoms have began to worsen.  Past Medical History:  Diagnosis Date   Anxiety    takes Xanax bid prn   Arthritis    Bruises easily    pt is on Plavix   Chronic back pain    buldging disc   Chronic neck pain    Coronary artery disease    1 stent    Dysphagia    Headache(784.0)    related to cervical issues   History of colonic polyps    Hyperlipidemia    takes Crestor daily   Hypertension    takes Coreg and Diovan daily   Myocardial infarction ([place]) 2019   Osteoporosis    was taking shot 2xyr;but not taking anymore   Peripheral vascular disease (HCC)    Seasonal allergies    takes Zyrtec nightly    Patient Active Problem List   Diagnosis Date Noted   Bilateral carotid bruits 08/10/2021   Stage 3b chronic kidney disease ([place]) 06/05/2021   Grief 06/05/2021   Prediabetes 06/05/2021   Spinal stenosis of lumbar region 06/05/2021   Coronary artery disease of native artery of native heart with stable angina pectoris ([place]) 06/07/2018   Anxiety 01/18/2018   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   Chronic renal disease, 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    Other abnormal glucose 01/15/2018   Neuralgia, 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   NSTEMI (non-ST elevated myocardial infarction) ([place]) 01/14/2018   Essential hypertension 01/14/2018   Hyperlipidemia 01/14/2018   Carotid stenosis, asymptomatic, bilateral 09/06/2012    Past Surgical History:  Procedure Laterality Date   ABDOMINAL HYSTERECTOMY  70's   BUNIONECTOMY     left foot   CARDIAC CATHETERIZATION  2009/2011   1 stent placed   CATARACT EXTRACTION Left 03/2021   CHOLECYSTECTOMY  2009   COLONOSCOPY     ESOPHAGOGASTRODUODENOSCOPY     LEFT HEART CATH AND CORONARY ANGIOGRAPHY N/A 01/16/2018   Procedure: LEFT HEART CATH AND CORONARY ANGIOGRAPHY;  Surgeon: Nigel Mormon, MD;  Location: [facility];  Service: Cardiovascular;  Laterality: N/A;   TONSILLECTOMY     at age 79    OB History   No obstetric history on file.      Home Medications    Prior to Admission medications   Medication Sig Start Date End Date Taking? Authorizing Provider  amoxicillin-clavulanate (AUGMENTIN) 875-125 MG tablet Take 1 tablet by mouth every 12 (twelve) hours. 09/28/21  Yes Abel Ra-Warren, Alda Lea, NP  fluticasone (FLONASE) 50 MCG/ACT nasal spray Place 2 sprays into both nostrils daily. 09/28/21  Yes Macyn Shropshire-Warren,  Alda Lea, NP  guaiFENesin (ROBITUSSIN) 100 MG/5ML liquid Take 5 mLs by mouth every 6 (six) hours as needed for up to 10 days for cough or to loosen phlegm. 09/28/21 10/08/21 Yes Arya Boxley-Warren, Alda Lea, NP  acetaminophen (TYLENOL) 325 MG tablet Take 325 mg by mouth every 6 (six) hours as needed for mild pain.    [provider]  ALPRAZolam (XANAX) 0.25 MG tablet TAKE (1) TABLET BY MOUTH TWICE DAILY AS NEEDED FOR ANXIETY. 05/21/21   Glendale Chard, MD  amLODipine (NORVASC) 10 MG tablet Take 1 tablet (10 mg total) by mouth daily. 06/02/21   Glendale Chard, MD  Apoaequorin (PREVAGEN PO) Take 1 tablet by mouth daily.    [provider]  Aspirin 81 MG CAPS     [provider]  CALCIUM PO Take 1 tablet by mouth daily. 600 mg    [provider]  carvedilol (COREG) 12.5 MG tablet TAKE (1) TABLET BY MOUTH TWICE DAILY. 07/13/21   Cantwell, Celeste C, PA-C  cetirizine (ZYRTEC) 10 MG tablet Take 10 mg by mouth at bedtime.    [provider]  diclofenac Sodium (VOLTAREN) 1 % GEL Apply 4 g topically 4 (four) times daily. 01/28/21   Landis Martins, DPM  ezetimibe (ZETIA) 10 MG tablet TAKE 1 TABLET BY MOUTH ONCE A DAY. 09/13/21   Adrian Prows, MD  famotidine (PEPCID) 20 MG tablet TAKE ONE TABLET BY MOUTH ONCE DAILY. 09/20/21   Glendale Chard, MD  furosemide (LASIX) 20 MG tablet TAKE 1 TABLET BY MOUTH DAILY 02/09/21   Cantwell, Celeste C, PA-C  Magnesium 250 MG TABS Take 1 tablet by mouth daily.    [provider]  Multiple Vitamin (MULTIVITAMIN WITH MINERALS) TABS Take 1 tablet by mouth daily.    [provider]  nitroGLYCERIN (NITROSTAT) 0.4 MG SL tablet Place 1 tablet (0.4 mg total) under the tongue every 5 (five) minutes x 3 doses as needed for chest pain. 03/18/20   Cantwell, Celeste C, PA-C  pravastatin (PRAVACHOL) 40 MG tablet Take 1 tablet (40 mg total) by mouth every evening. 04/15/21 09/13/21  Cantwell, Celeste C, PA-C  pregabalin (LYRICA) 75 MG capsule TAKE 1 CAPSULE BY MOUTH THREE TIMES A DAY. 06/02/21   Glendale Chard, MD  Probiotic Product (PROBIOTIC DAILY PO) Take 1 tablet by mouth daily at 12 noon.    [provider]  sertraline (ZOLOFT) 25 MG tablet Take 1 tablet (25 mg total) by mouth daily. 08/10/21 08/10/22  Glendale Chard, MD  tiZANidine (ZANAFLEX) 4 MG tablet Take 1 tablet (4 mg total) by mouth at bedtime as needed for muscle spasms. 09/13/21 09/13/22  Glendale Chard, MD  triamcinolone cream (KENALOG) 0.1 % Apply 1 Application topically 2 (two) times daily. 08/26/21   Minette Brine, FNP    Family History Family History  Problem Relation Age of Onset   Heart disease Mother        Heart Dissease before age 85    Heart attack Mother    Cancer Father    Cancer Sister    Leukemia Sister    Cancer Brother    Heart disease Brother        Amputation   Heart attack Brother    Heart disease Brother    Heart attack Brother    Dementia Other    Anesthesia problems Neg Hx    Hypotension Neg Hx    Malignant hyperthermia Neg Hx    Pseudochol deficiency Neg Hx     Social  History Social History   Tobacco Use   Smoking status: Former    Packs/day: 0.25    Years: 5.00    Total pack years: 1.25    Types: Cigarettes    Quit date: 11/27/1968    Years since quitting: 52.8   Smokeless tobacco: Never  Vaping Use   Vaping Use: Never used  Substance Use Topics   Alcohol use: No   Drug use: No     Allergies   Codeine and Shellfish allergy   Review of Systems Review of Systems Per HPI  Physical Exam Triage Vital Signs ED Triage Vitals  Enc Vitals Group     BP 09/28/21 1432 (!) 159/66     Pulse Rate 09/28/21 1432 66     Resp 09/28/21 1432 18     Temp 09/28/21 1432 98.2 F (36.8 C)     Temp Source 09/28/21 1432 Oral     SpO2 09/28/21 1432 93 %     Weight --      Height --      Head Circumference --      Peak Flow --      Pain Score 09/28/21 1433 7     Pain Loc --      Pain Edu? --      Excl. in [place]? --    No data found.  Updated Vital Signs BP (!) 159/66 (BP Location: Right Arm)   Pulse 66   Temp 98.2 F (36.8 C) (Oral)   Resp 18   SpO2 93%   Visual Acuity Right Eye Distance:   Left Eye Distance:   Bilateral Distance:    Right Eye Near:   Left Eye Near:    Bilateral Near:     Physical Exam Vitals and nursing note reviewed.  Constitutional:      General: She is not in acute distress.    Appearance: Normal appearance. She is well-developed.  HENT:     Head: Normocephalic and atraumatic.     Right Ear: Ear canal and external ear normal. A middle ear effusion is present.     Left Ear: Ear canal and external ear normal. A middle ear effusion is present.     Nose:  Congestion present. No rhinorrhea.     Right Turbinates: Enlarged and swollen.     Left Turbinates: Enlarged and swollen.     Left Sinus: Maxillary sinus tenderness and frontal sinus tenderness present.     Mouth/Throat:     Mouth: Mucous membranes are moist.     Pharynx: Posterior oropharyngeal erythema present.  Eyes:     Conjunctiva/sclera: Conjunctivae normal.     Pupils: Pupils are equal, round, and reactive to light.  Neck:     Thyroid: No thyromegaly.     Trachea: No tracheal deviation.  Cardiovascular:     Rate and Rhythm: Normal rate and regular rhythm.     Pulses: Normal pulses.     Heart sounds: Normal heart sounds.  Pulmonary:     Effort: Pulmonary effort is normal.     Breath sounds: Normal breath sounds.  Abdominal:     General: Bowel sounds are normal. There is no distension.     Palpations: Abdomen is soft.     Tenderness: There is no abdominal tenderness.  Musculoskeletal:     Cervical back: Normal range of motion and neck supple.  Skin:    General: Skin is warm and dry.  Neurological:     General: No focal deficit present.  Mental Status: She is alert and oriented to person, place, and time.  Psychiatric:        Mood and Affect: Mood normal.        Behavior: Behavior normal.        Thought Content: Thought content normal.        Judgment: Judgment normal.      UC Treatments / Results  Labs (all labs ordered are listed, but only abnormal results are displayed) Labs Reviewed - No data to display  EKG   Radiology No results found.  Procedures Procedures (including critical care time)  Medications Ordered in UC Medications - No data to display  Initial Impression / Assessment and Plan / UC Course  I have reviewed the triage vital signs and the nursing notes.  Pertinent labs & imaging results that were available during my care of the patient were reviewed by me and considered in my medical decision making (see chart for details).  Patient  presents for upper respiratory symptoms that been present for more than 1 week, but over the past 4 to 5 days, symptoms have started to worsen.  On exam, patient has left-sided maxillary and frontal sinus tenderness.  Her turbinates are also inflamed and swollen.  Given the duration of the patient's symptoms and her current presentation, symptoms are consistent with a bacterial sinusitis.  We will treat patient with Augmentin and fluticasone.  Supportive care recommendations were provided to the patient.  Patient advised to follow-up with her primary care physician if her symptoms fail to improve. Final Clinical Impressions(s) / UC Diagnoses   Final diagnoses:  Acute pansinusitis, recurrence not specified     Discharge Instructions      Take medication as directed. Increase fluids and get plenty of rest. May take over-the-counter iTylenol as needed for pain, fever, or general discomfort. Recommend normal saline nasal spray to help with nasal congestion throughout the day. For your cough, it may be helpful to use a humidifier at bedtime during sleep. If your symptoms fail to improve within the next 7 to 10 days, please follow-up with your primary care physician.     ED Prescriptions     Medication Sig Dispense Auth. Provider   amoxicillin-clavulanate (AUGMENTIN) 875-125 MG tablet Take 1 tablet by mouth every 12 (twelve) hours. 14 tablet Ashok Sawaya-Warren, Alda Lea, NP   fluticasone (FLONASE) 50 MCG/ACT nasal spray Place 2 sprays into both nostrils daily. 16 g Risa Auman-Warren, Alda Lea, NP   guaiFENesin (ROBITUSSIN) 100 MG/5ML liquid Take 5 mLs by mouth every 6 (six) hours as needed for up to 10 days for cough or to loosen phlegm. 200 mL Saki Legore-Warren, Alda Lea, NP      PDMP not reviewed this encounter.   Tish Men, NP 09/28/21 1503

## 2021-09-28 NOTE — ED Triage Notes (Signed)
Pain on left side of face and ear pain since Friday

## 2021-09-30 ENCOUNTER — Encounter: Payer: Self-pay | Admitting: *Deleted

## 2021-09-30 NOTE — Progress Notes (Signed)
[facility] PRE[facility] Evaluation  Patient Details  Name: Kathryn [name] MRN: [id number] Date of Birth: Jan 20, 1941 Age: 81 y.o. PCP: Kathryn Chard, MD  Vitals:   09/30/21 1544  BP: 128/68  Pulse: 65  SpO2: 98%  Weight: 215 lb 3.2 oz (97.6 kg)  Height: '5\' 5"'$  (1.651 m)     [facility] Eval - 09/30/21 1500       [facility] "PREP" Location   [facility] "PREP" Location [place]      Referral    Referring Provider Kathryn [name]    Reason for referral Obesitity/Overweight;Inactivity;Hypertension   CAD   Program Start Date 10/05/21      Measurement   Waist Circumference 52.5 inches    Hip Circumference 59.75 inches    Body fat 49.4 percent      Information for Trainer   Goals weight loss 10-12lbs in 12 weeks, establish an exercise routine, strength training    Current Exercise walking, stationary bike    Orthopedic Concerns 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 1    I have no trouble taking out the trash. 2    I do housework such as vacuuming and dusting on my own without difficulty. 2    I can easily lift a gallon of milk (8lbs). 4    I can easily walk a mile. 1    I have no trouble reaching into high cupboards or reaching down to pick up something from the floor. 2    I do not have trouble doing out-door work such as Armed forces logistics/support/administrative officer, raking leaves, or gardening. 1      Mobility and Daily Activities   I feel younger than my age. 2    I feel independent. 3    I feel energetic. 2    I live an active life.  3    I feel strong. 3    I feel healthy. 3    I feel active as other people my age. 3      How fit and strong are you.   Fit and Strong Total Score 32            Past Medical History:  Diagnosis Date   Anxiety    takes Xanax bid prn    Arthritis    Bruises easily    pt is on Plavix   Chronic back pain    buldging disc   Chronic neck pain    Coronary artery disease    1 stent    Dysphagia    Headache(784.0)    related to cervical issues   History of colonic polyps    Hyperlipidemia    takes Crestor daily   Hypertension    takes Coreg and Diovan daily   Myocardial infarction ([place]) 2019   Osteoporosis    was taking shot 2xyr;but not taking anymore   Peripheral vascular disease (HCC)    Seasonal allergies    takes Zyrtec nightly   Past Surgical History:  Procedure Laterality Date   ABDOMINAL HYSTERECTOMY  70's   BUNIONECTOMY     left foot   CARDIAC CATHETERIZATION  2009/2011   1 stent placed   CATARACT EXTRACTION Left 03/2021  CHOLECYSTECTOMY  2009   COLONOSCOPY     ESOPHAGOGASTRODUODENOSCOPY     LEFT HEART CATH AND CORONARY ANGIOGRAPHY N/A 01/16/2018   Procedure: LEFT HEART CATH AND CORONARY ANGIOGRAPHY;  Surgeon: Kathryn Mormon, MD;  Location: [facility];  Service: Cardiovascular;  Laterality: N/A;   TONSILLECTOMY     at age 78   Social History   Tobacco Use  Smoking Status Former   Packs/day: 0.25   Years: 5.00   Total pack years: 1.25   Types: Cigarettes   Quit date: 11/27/1968   Years since quitting: 52.8  Smokeless Tobacco Never    Kathryn [name] 09/30/2021, 3:55 PM

## 2021-10-11 ENCOUNTER — Ambulatory Visit: Payer: Medicare Other | Admitting: Student

## 2021-10-11 ENCOUNTER — Ambulatory Visit: Payer: Medicare Other | Admitting: Cardiology

## 2021-10-14 ENCOUNTER — Ambulatory Visit
Admission: EM | Admit: 2021-10-14 | Discharge: 2021-10-14 | Disposition: A | Discharge status: Home / Self Care | Payer: Medicare Other | Attending: Family Medicine | Admitting: Family Medicine

## 2021-10-14 DIAGNOSIS — R051 Acute cough: Secondary | ICD-10-CM | POA: Diagnosis not present

## 2021-10-14 DIAGNOSIS — J0100 Acute maxillary sinusitis, unspecified: Secondary | ICD-10-CM

## 2021-10-14 MED ORDER — AMOXICILLIN-POT CLAVULANATE 875-125 MG PO TABS: 1.0000 | ORAL_TABLET | Freq: Two times a day (BID) | ORAL | 0 refills | Status: DC

## 2021-10-14 NOTE — ED Provider Notes (Signed)
[facility]    CSN: [id number] Arrival date & time: 10/14/21  1435      History   Chief Complaint Chief Complaint  Patient presents with   Nausea   Fatigue    HPI Kathryn [name] is a 81 y.o. female.   Patient presenting today with several week history of fatigue, congestion, sinus pressure, postnasal drainage, hacking cough.  States symptoms have significantly worse in the past day or so with worsening facial pain and pressure, chills and now some left-sided mid back soreness worse with coughing.  Denies recent fever, body aches, chest pain, shortness of breath, abdominal pain, nausea vomiting or diarrhea.  Taking over-the-counter cough medication and Tylenol with minimal relief.    Past Medical History:  Diagnosis Date   Anxiety    takes Xanax bid prn   Arthritis    Bruises easily    pt is on Plavix   Chronic back pain    buldging disc   Chronic neck pain    Coronary artery disease    1 stent    Dysphagia    Headache(784.0)    related to cervical issues   History of colonic polyps    Hyperlipidemia    takes Crestor daily   Hypertension    takes Coreg and Diovan daily   Myocardial infarction ([place]) 2019   Osteoporosis    was taking shot 2xyr;but not taking anymore   Peripheral vascular disease (HCC)    Seasonal allergies    takes Zyrtec nightly    Patient Active Problem List   Diagnosis Date Noted   Bilateral carotid bruits 08/10/2021   Stage 3b chronic kidney disease ([place]) 06/05/2021   Grief 06/05/2021   Prediabetes 06/05/2021   Spinal stenosis of lumbar region 06/05/2021   Coronary artery disease of native artery of native heart with stable angina pectoris ([place]) 06/07/2018   Anxiety 01/18/2018   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   Chronic renal disease, 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   Other abnormal glucose 01/15/2018   Neuralgia, 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   NSTEMI (non-ST elevated myocardial infarction) ([place]) 01/14/2018   Essential  hypertension 01/14/2018   Hyperlipidemia 01/14/2018   Carotid stenosis, asymptomatic, bilateral 09/06/2012    Past Surgical History:  Procedure Laterality Date   ABDOMINAL HYSTERECTOMY  70's   BUNIONECTOMY     left foot   CARDIAC CATHETERIZATION  2009/2011   1 stent placed   CATARACT EXTRACTION Left 03/2021   CHOLECYSTECTOMY  2009   COLONOSCOPY     ESOPHAGOGASTRODUODENOSCOPY     LEFT HEART CATH AND CORONARY ANGIOGRAPHY N/A 01/16/2018   Procedure: LEFT HEART CATH AND CORONARY ANGIOGRAPHY;  Surgeon: Nigel Mormon, MD;  Location: [facility];  Service: Cardiovascular;  Laterality: N/A;   TONSILLECTOMY     at age 56    OB History   No obstetric history on file.      Home Medications    Prior to Admission medications   Medication Sig Start Date End Date Taking? Authorizing Provider  amoxicillin-clavulanate (AUGMENTIN) 875-125 MG tablet Take 1 tablet by mouth every 12 (twelve) hours. 10/14/21  Yes Volney American, PA-C  acetaminophen (TYLENOL) 325 MG tablet Take 325 mg by mouth every 6 (six) hours as needed for mild pain.    [provider]  ALPRAZolam (XANAX) 0.25 MG tablet TAKE (1) TABLET BY MOUTH TWICE DAILY AS NEEDED FOR ANXIETY. 05/21/21   Glendale Chard, MD  amLODipine (NORVASC) 10 MG tablet Take 1 tablet (10 mg total) by  mouth daily. 06/02/21   Glendale Chard, MD  amoxicillin-clavulanate (AUGMENTIN) 875-125 MG tablet Take 1 tablet by mouth every 12 (twelve) hours. 09/28/21   Leath-Warren, Alda Lea, NP  Apoaequorin (PREVAGEN PO) Take 1 tablet by mouth daily.    [provider]  Aspirin 81 MG CAPS     [provider]  CALCIUM PO Take 1 tablet by mouth daily. 600 mg    [provider]  carvedilol (COREG) 12.5 MG tablet TAKE (1) TABLET BY MOUTH TWICE DAILY. 07/13/21   Cantwell, Celeste C, PA-C  cetirizine (ZYRTEC) 10 MG tablet Take 10 mg by mouth at bedtime.    [provider]  diclofenac Sodium (VOLTAREN) 1 % GEL  Apply 4 g topically 4 (four) times daily. 01/28/21   Landis Martins, DPM  ezetimibe (ZETIA) 10 MG tablet TAKE 1 TABLET BY MOUTH ONCE A DAY. 09/13/21   Adrian Prows, MD  famotidine (PEPCID) 20 MG tablet TAKE ONE TABLET BY MOUTH ONCE DAILY. 09/20/21   Glendale Chard, MD  fluticasone [facility]) 50 MCG/ACT nasal spray Place 2 sprays into both nostrils daily. 09/28/21   Leath-Warren, Alda Lea, NP  furosemide (LASIX) 20 MG tablet TAKE 1 TABLET BY MOUTH DAILY 02/09/21   Cantwell, Celeste C, PA-C  Magnesium 250 MG TABS Take 1 tablet by mouth daily.    [provider]  Multiple Vitamin (MULTIVITAMIN WITH MINERALS) TABS Take 1 tablet by mouth daily.    [provider]  nitroGLYCERIN (NITROSTAT) 0.4 MG SL tablet Place 1 tablet (0.4 mg total) under the tongue every 5 (five) minutes x 3 doses as needed for chest pain. 03/18/20   Cantwell, Celeste C, PA-C  pravastatin (PRAVACHOL) 40 MG tablet Take 1 tablet (40 mg total) by mouth every evening. 04/15/21 09/13/21  Cantwell, Celeste C, PA-C  pregabalin (LYRICA) 75 MG capsule TAKE 1 CAPSULE BY MOUTH THREE TIMES A DAY. 06/02/21   Glendale Chard, MD  Probiotic Product (PROBIOTIC DAILY PO) Take 1 tablet by mouth daily at 12 noon.    [provider]  sertraline (ZOLOFT) 25 MG tablet Take 1 tablet (25 mg total) by mouth daily. 08/10/21 08/10/22  Glendale Chard, MD  tiZANidine (ZANAFLEX) 4 MG tablet Take 1 tablet (4 mg total) by mouth at bedtime as needed for muscle spasms. 09/13/21 09/13/22  Glendale Chard, MD  triamcinolone cream (KENALOG) 0.1 % Apply 1 Application topically 2 (two) times daily. 08/26/21   Minette Brine, FNP    Family History Family History  Problem Relation Age of Onset   Heart disease Mother        Heart Dissease before age 22   Heart attack Mother    Cancer Father    Cancer Sister    Leukemia Sister    Cancer Brother    Heart disease Brother        Amputation   Heart attack Brother    Heart disease Brother    Heart attack  Brother    Dementia Other    Anesthesia problems Neg Hx    Hypotension Neg Hx    Malignant hyperthermia Neg Hx    Pseudochol deficiency Neg Hx     Social History Social History   Tobacco Use   Smoking status: Former    Packs/day: 0.25    Years: 5.00    Total pack years: 1.25    Types: Cigarettes    Quit date: 11/27/1968    Years since quitting: 52.9   Smokeless tobacco: Never  Vaping Use  Vaping Use: Never used  Substance Use Topics   Alcohol use: No   Drug use: No     Allergies   Codeine and Shellfish allergy   Review of Systems Review of Systems Per HPI  Physical Exam Triage Vital Signs ED Triage Vitals [10/14/21 1523]  Enc Vitals Group     BP (!) 143/80     Pulse Rate 68     Resp 18     Temp 98 F (36.7 C)     Temp Source Oral     SpO2 94 %     Weight      Height      Head Circumference      Peak Flow      Pain Score 4     Pain Loc      Pain Edu?      Excl. in [place]?    No data found.  Updated Vital Signs BP (!) 143/80 (BP Location: Left Arm)   Pulse 68   Temp 98 F (36.7 C) (Oral)   Resp 18   SpO2 94%   Visual Acuity Right Eye Distance:   Left Eye Distance:   Bilateral Distance:    Right Eye Near:   Left Eye Near:    Bilateral Near:     Physical Exam Vitals and nursing note reviewed.  Constitutional:      Appearance: Normal appearance.  HENT:     Head: Atraumatic.     Right Ear: Tympanic membrane and external ear normal.     Left Ear: Tympanic membrane and external ear normal.     Nose: Congestion present.     Mouth/Throat:     Mouth: Mucous membranes are moist.     Pharynx: Posterior oropharyngeal erythema present.  Eyes:     Extraocular Movements: Extraocular movements intact.     Conjunctiva/sclera: Conjunctivae normal.  Cardiovascular:     Rate and Rhythm: Normal rate and regular rhythm.     Heart sounds: Normal heart sounds.  Pulmonary:     Effort: Pulmonary effort is normal.     Breath sounds: Normal breath sounds.  No wheezing or rales.  Musculoskeletal:        General: Normal range of motion.     Cervical back: Normal range of motion and neck supple.  Skin:    General: Skin is warm and dry.  Neurological:     Mental Status: She is alert and oriented to person, place, and time.  Psychiatric:        Mood and Affect: Mood normal.        Thought Content: Thought content normal.      UC Treatments / Results  Labs (all labs ordered are listed, but only abnormal results are displayed) Labs Reviewed - No data to display  EKG   Radiology No results found.  Procedures Procedures (including critical care time)  Medications Ordered in UC Medications - No data to display  Initial Impression / Assessment and Plan / UC Course  I have reviewed the triage vital signs and the nursing notes.  Pertinent labs & imaging results that were available during my care of the patient were reviewed by me and considered in my medical decision making (see chart for details).     Given duration and worsening course, treat with Augmentin, Mucinex, Flonase, sinus rinses, over-the-counter pain relievers.  Return for worsening symptoms.  Final Clinical Impressions(s) / UC Diagnoses   Final diagnoses:  Acute non-recurrent maxillary sinusitis  Acute  cough     Discharge Instructions      You may try plain Mucinex, Flonase nasal spray twice daily, sinus rinses, Tylenol for your symptoms.  Follow-up if your symptoms are worsening    ED Prescriptions     Medication Sig Dispense Auth. Provider   amoxicillin-clavulanate (AUGMENTIN) 875-125 MG tablet Take 1 tablet by mouth every 12 (twelve) hours. 14 tablet Volney American, [place]      PDMP not reviewed this encounter.   Volney American, [place] 10/14/21 1605

## 2021-10-14 NOTE — ED Triage Notes (Signed)
Pt present nausea with fatigue along with nasal  congestion and drainage. Symptoms started two weeks ago.

## 2021-10-14 NOTE — Discharge Instructions (Signed)
You may try plain Mucinex, Flonase nasal spray twice daily, sinus rinses, Tylenol for your symptoms.  Follow-up if your symptoms are worsening

## 2021-10-19 ENCOUNTER — Encounter: Payer: Self-pay | Admitting: *Deleted

## 2021-10-19 NOTE — Progress Notes (Signed)
[facility] PREP Weekly Session  Patient Details  Name: Kathryn [name] MRN: [id number] Date of Birth: 1940/08/29 Age: 81 y.o. PCP: Glendale Chard, MD  Vitals:   10/19/21 1628  Weight: 218 lb (98.9 kg)     [facility] Weekly seesion - 10/19/21 1600       [facility] "PREP" Location   [facility] "PREP" Location [facility]      Weekly Session   Topic Discussed Healthy eating tips   Sodium intake 1500-2300 mg/day, added sugars 24 gm/day for women 36gm/day for men, YUKA app.   Classes attended to date [place], [place] 10/19/2021, 4:30 PM

## 2021-10-22 ENCOUNTER — Other Ambulatory Visit: Payer: Self-pay | Admitting: Cardiology

## 2021-10-22 DIAGNOSIS — I25118 Atherosclerotic heart disease of native coronary artery with other forms of angina pectoris: Secondary | ICD-10-CM

## 2021-10-25 ENCOUNTER — Ambulatory Visit: Payer: Self-pay

## 2021-10-25 NOTE — Patient Outreach (Signed)
  Care Coordination   10/25/2021 Name: Kathryn [name] MRN: [id number] DOB: [birth year]   Care Coordination Outreach Attempts:  An unsuccessful telephone outreach was attempted for a scheduled appointment today.  Follow Up Plan:  Additional outreach attempts will be made to offer the patient care coordination information and services.   Encounter Outcome:  Pt. Request to Call Back  Care Coordination Interventions Activated:  No   Care Coordination Interventions:  No, not indicated    Barb Merino, RN, BSN, CCM Care Management Coordinator [facility]  Direct Phone: [phone]

## 2021-10-26 ENCOUNTER — Encounter: Payer: Self-pay | Admitting: *Deleted

## 2021-10-26 NOTE — Progress Notes (Signed)
[facility] PREP Weekly Session  Patient Details  Name: Kathryn [name] MRN: [id number] Date of Birth: July 31, 1940 Age: 81 y.o. PCP: Glendale Chard, MD  Vitals:   10/26/21 1627  Weight: 218 lb (98.9 kg)     [facility] Weekly seesion - 10/26/21 1600       [facility] "PREP" Location   [facility] "PREP" Location [place]      Weekly Session   Topic Discussed Health habits;Water   Water: 1/2 body weight in oz's per day or at least 64oz/day (if not on fluid restriction). Sugar demo.   Minutes exercised this week 65 minutes    Classes attended to date [place], [place] 10/26/2021, 4:30 PM

## 2021-11-02 ENCOUNTER — Ambulatory Visit: Payer: Medicare Other | Admitting: Internal Medicine

## 2021-11-02 ENCOUNTER — Encounter: Payer: Self-pay | Admitting: Internal Medicine

## 2021-11-02 ENCOUNTER — Encounter: Payer: Self-pay | Admitting: *Deleted

## 2021-11-02 VITALS — BP 128/52 | HR 71 | Temp 98.0°F | Resp 16 | Ht 65.0 in | Wt 221.0 lb

## 2021-11-02 DIAGNOSIS — I6523 Occlusion and stenosis of bilateral carotid arteries: Secondary | ICD-10-CM | POA: Diagnosis not present

## 2021-11-02 DIAGNOSIS — I10 Essential (primary) hypertension: Secondary | ICD-10-CM | POA: Diagnosis not present

## 2021-11-02 DIAGNOSIS — E782 Mixed hyperlipidemia: Secondary | ICD-10-CM | POA: Diagnosis not present

## 2021-11-02 DIAGNOSIS — I25118 Atherosclerotic heart disease of native coronary artery with other forms of angina pectoris: Secondary | ICD-10-CM | POA: Diagnosis not present

## 2021-11-02 NOTE — Progress Notes (Signed)
[facility] PREP Weekly Session  Patient Details  Name: Kathryn [name] MRN: [id number] Date of Birth: 1940-09-30 Age: 81 y.o. PCP: Glendale Chard, MD  Vitals:   11/02/21 1638  Weight: 219 lb (99.3 kg)     [facility] Weekly seesion - 11/02/21 1600       [facility] "PREP" Location   [facility] Location [facility]      Weekly Session   Topic Discussed Eating for the season;Restaurant Eating   Salt intake 1500-'2300mg'$ /day. Salt demo, label reading.   Minutes exercised this week 90 minutes    Classes attended to date [place], [place] 11/02/2021, 4:40 PM

## 2021-11-02 NOTE — Progress Notes (Signed)
Primary Physician/Referring:  Glendale Chard, MD  Patient ID: Kathryn [name], female    DOB: 1941-02-02, 81 y.o.   MRN: [id number]  Chief Complaint  Patient presents with   Coronary Artery Disease   Hyperlipidemia   Follow-up    6 month   HPI:    Kathryn [name]  is a 81 y.o. AAFwith CAD s/p PCI to LCx 2009, hypertension, statin intolerant hyperlipidemia, CKD stage 3 and mild bilateral asymptomatic carotid stenosis. Coronary Angiography on 01/16/18 that showed no obstructive CAD and patent stents from 2009. Patient is currently taking pravastatin and zetia, she is enrolled in esperion trial. Patient does have history of chronic left-sided post herpetic chest pain.  Patient presents for 2-monthfollow-up.  Last office visit patient was stable from a cardiovascular standpoint, therefore no changes were made. On recent carotid artery duplex right ICA stenosis greater than 70%, no longer demonstrated we will therefore plan to follow-up with annual surveillance.   Patient is feeling well overall without specific complaints today. Denies chest pain, worsening dyspnea.  Past Medical History:  Diagnosis Date   Anxiety    takes Xanax bid prn   Arthritis    Bruises easily    pt is on Plavix   Chronic back pain    buldging disc   Chronic neck pain    Coronary artery disease    1 stent    Dysphagia    Headache(784.0)    related to cervical issues   History of colonic polyps    Hyperlipidemia    takes Crestor daily   Hypertension    takes Coreg and Diovan daily   Myocardial infarction ([facility] 2019   Osteoporosis    was taking shot 2xyr;but not taking anymore   Peripheral vascular disease (HCC)    Seasonal allergies    takes Zyrtec nightly   Family History  Problem Relation Age of Onset   Heart disease Mother        Heart Dissease before age 82  Heart attack Mother    Cancer Father    Cancer Sister    Leukemia Sister    Cancer Brother    Heart disease Brother         Amputation   Heart attack Brother    Heart disease Brother    Heart attack Brother    Dementia Other    Anesthesia problems Neg Hx    Hypotension Neg Hx    Malignant hyperthermia Neg Hx    Pseudochol deficiency Neg Hx    Past Surgical History:  Procedure Laterality Date   ABDOMINAL HYSTERECTOMY  81's   BUNIONECTOMY     left foot   CARDIAC CATHETERIZATION  2009/2011   1 stent placed   CATARACT EXTRACTION Left 03/2021   CHOLECYSTECTOMY  2009   COLONOSCOPY     ESOPHAGOGASTRODUODENOSCOPY     LEFT HEART CATH AND CORONARY ANGIOGRAPHY N/A 01/16/2018   Procedure: LEFT HEART CATH AND CORONARY ANGIOGRAPHY;  Surgeon: PNigel Mormon MD;  [facility];  Service: Cardiovascular;  Laterality: N/A;   TONSILLECTOMY     at age 81  Social History   Tobacco Use   Smoking status: Former    Packs/day: 0.25    Years: 5.00    Total pack years: 1.25    Types: Cigarettes    Quit date: 11/27/1968    Years since quitting: 52.9   Smokeless tobacco: Never  Substance Use Topics   Alcohol use: No  Marital status: Married  ROS  Review of Systems  Cardiovascular:  Positive for dyspnea on exertion (stable, chronic). Negative for chest pain (No recurrence, history of postherpetic pain), claudication, leg swelling, near-syncope, orthopnea, palpitations, paroxysmal nocturnal dyspnea and syncope.  Respiratory:  Negative for shortness of breath.   Musculoskeletal:  Positive for arthritis.  All other systems reviewed and are negative.  Objective      11/02/2021   11:40 AM 10/26/2021    4:27 PM 10/19/2021    4:28 PM  Vitals with BMI  Height '5\' 5"'$     Weight 221 lbs 218 lbs 218 lbs  BMI 78.93  81.01  Systolic 751    Diastolic 52    Pulse 71        Physical Exam Vitals reviewed.  Constitutional:      Comments: Moderately built and moderately obese in no acute distress.  Neck:     Comments: Short neck Cardiovascular:     Rate and Rhythm: Normal rate and regular rhythm.      Pulses: Intact distal pulses.          Carotid pulses are  on the right side with bruit and  on the left side with bruit.    Heart sounds: Murmur heard.     Harsh midsystolic murmur is present with a grade of 2/6 at the upper right sternal border radiating to the neck.     No gallop.     Comments: No JVD Pulmonary:     Effort: Pulmonary effort is normal.     Breath sounds: Normal breath sounds.  Musculoskeletal:     Right lower leg: No edema.     Left lower leg: No edema.  Neurological:     Mental Status: She is alert.    Laboratory examination:    Recent Labs    03/16/21 1159 06/02/21 1204 09/13/21 1157  NA 140 148* 143  K 4.5 4.6 5.0  CL 103 108* 105  CO2 '23 25 22  '$ GLUCOSE 99 92 93  BUN '15 12 17  '$ CREATININE 1.29* 1.13* 1.13*  CALCIUM 9.2 9.9 9.4   CrCl cannot be calculated (Patient's most recent lab result is older than the maximum 21 days allowed.).     Latest Ref Rng & Units 09/13/2021   11:57 AM 06/02/2021   12:04 PM 03/16/2021   11:59 AM  CMP  Glucose 70 - 99 mg/dL 93  92  99   BUN 8 - 27 mg/dL '17  12  15   '$ Creatinine 0.57 - 1.00 mg/dL 1.13  1.13  1.29   Sodium 134 - 144 mmol/L 143  148  140   Potassium 3.5 - 5.2 mmol/L 5.0  4.6  4.5   Chloride 96 - 106 mmol/L 105  108  103   CO2 20 - 29 mmol/L '22  25  23   '$ Calcium 8.7 - 10.3 mg/dL 9.4  9.9  9.2   Total Protein 6.0 - 8.5 g/dL 6.4   6.6   Total Bilirubin 0.0 - 1.2 mg/dL 0.3   0.4   Alkaline Phos 44 - 121 IU/L 140   163   AST 0 - 40 IU/L 16   12   ALT 0 - 32 IU/L 11   9       Latest Ref Rng & Units 09/13/2021   11:57 AM 03/16/2021   11:59 AM 10/05/2020   12:15 PM  CBC  WBC 3.4 - 10.8 x10E3/uL 8.9  8.2  7.9  Hemoglobin 11.1 - 15.9 g/dL 11.8  12.2  11.8   Hematocrit 34.0 - 46.6 % 38.9  40.0  37.3   Platelets 150 - 450 x10E3/uL 428  408  393    Lipid Panel     Component Value Date/Time   CHOL 206 (H) 02/16/2021 1505   TRIG 86 02/16/2021 1505   HDL 65 02/16/2021 1505   CHOLHDL 3.0 02/13/2020 1602    LDLCALC 126 (H) 02/16/2021 1505   HEMOGLOBIN A1C Lab Results  Component Value Date   HGBA1C 5.6 09/13/2021   TSH No results for input(s): "TSH" in the last 8760 hours.  Allergies   Allergies  Allergen Reactions   Codeine Hypertension   Shellfish Allergy     Medications Prior to Visit:   Outpatient Medications Prior to Visit  Medication Sig Dispense Refill   acetaminophen (TYLENOL) 325 MG tablet Take 325 mg by mouth every 6 (six) hours as needed for mild pain.     ALPRAZolam (XANAX) 0.25 MG tablet TAKE (1) TABLET BY MOUTH TWICE DAILY AS NEEDED FOR ANXIETY. 30 tablet 0   amLODipine (NORVASC) 10 MG tablet Take 1 tablet (10 mg total) by mouth daily. 90 tablet 2   Apoaequorin (PREVAGEN PO) Take 1 tablet by mouth daily.     Aspirin 81 MG CAPS      CALCIUM PO Take 1 tablet by mouth daily. 600 mg     carvedilol (COREG) 12.5 MG tablet TAKE (1) TABLET BY MOUTH TWICE DAILY. 180 tablet 0   cetirizine (ZYRTEC) 10 MG tablet Take 10 mg by mouth at bedtime.     diclofenac Sodium (VOLTAREN) 1 % GEL Apply 4 g topically 4 (four) times daily. 150 g 1   ezetimibe (ZETIA) 10 MG tablet TAKE 1 TABLET BY MOUTH ONCE A DAY. 30 tablet 0   famotidine (PEPCID) 20 MG tablet TAKE ONE TABLET BY MOUTH ONCE DAILY. 30 tablet 0   fluticasone (FLONASE) 50 MCG/ACT nasal spray Place 2 sprays into both nostrils daily. 16 g 0   furosemide (LASIX) 20 MG tablet TAKE 1 TABLET BY MOUTH DAILY 90 tablet 0   Multiple Vitamin (MULTIVITAMIN WITH MINERALS) TABS Take 1 tablet by mouth daily.     nitroGLYCERIN (NITROSTAT) 0.4 MG SL tablet Place 1 tablet (0.4 mg total) under the tongue every 5 (five) minutes x 3 doses as needed for chest pain. 30 tablet 3   pravastatin (PRAVACHOL) 40 MG tablet Take 1 tablet (40 mg total) by mouth every evening. 90 tablet 3   pregabalin (LYRICA) 75 MG capsule TAKE 1 CAPSULE BY MOUTH THREE TIMES A DAY. 90 capsule 3   Probiotic Product (PROBIOTIC DAILY PO) Take 1 tablet by mouth daily at 12 noon.      sertraline (ZOLOFT) 25 MG tablet Take 1 tablet (25 mg total) by mouth daily. 90 tablet 2   tiZANidine (ZANAFLEX) 4 MG tablet Take 1 tablet (4 mg total) by mouth at bedtime as needed for muscle spasms. 30 tablet 1   triamcinolone cream (KENALOG) 0.1 % Apply 1 Application topically 2 (two) times daily. 30 g 0   amoxicillin-clavulanate (AUGMENTIN) 875-125 MG tablet Take 1 tablet by mouth every 12 (twelve) hours. 14 tablet 0   amoxicillin-clavulanate (AUGMENTIN) 875-125 MG tablet Take 1 tablet by mouth every 12 (twelve) hours. 14 tablet 0   Magnesium 250 MG TABS Take 1 tablet by mouth daily.     No facility-administered medications prior to visit.   Final Medications at End of Visit  Current Meds  Medication Sig   acetaminophen (TYLENOL) 325 MG tablet Take 325 mg by mouth every 6 (six) hours as needed for mild pain.   ALPRAZolam (XANAX) 0.25 MG tablet TAKE (1) TABLET BY MOUTH TWICE DAILY AS NEEDED FOR ANXIETY.   amLODipine (NORVASC) 10 MG tablet Take 1 tablet (10 mg total) by mouth daily.   Apoaequorin (PREVAGEN PO) Take 1 tablet by mouth daily.   Aspirin 81 MG CAPS    CALCIUM PO Take 1 tablet by mouth daily. 600 mg   carvedilol (COREG) 12.5 MG tablet TAKE (1) TABLET BY MOUTH TWICE DAILY.   cetirizine (ZYRTEC) 10 MG tablet Take 10 mg by mouth at bedtime.   diclofenac Sodium (VOLTAREN) 1 % GEL Apply 4 g topically 4 (four) times daily.   ezetimibe (ZETIA) 10 MG tablet TAKE 1 TABLET BY MOUTH ONCE A DAY.   famotidine (PEPCID) 20 MG tablet TAKE ONE TABLET BY MOUTH ONCE DAILY.   fluticasone (FLONASE) 50 MCG/ACT nasal spray Place 2 sprays into both nostrils daily.   furosemide (LASIX) 20 MG tablet TAKE 1 TABLET BY MOUTH DAILY   Multiple Vitamin (MULTIVITAMIN WITH MINERALS) TABS Take 1 tablet by mouth daily.   nitroGLYCERIN (NITROSTAT) 0.4 MG SL tablet Place 1 tablet (0.4 mg total) under the tongue every 5 (five) minutes x 3 doses as needed for chest pain.   pravastatin (PRAVACHOL) 40 MG tablet  Take 1 tablet (40 mg total) by mouth every evening.   pregabalin (LYRICA) 75 MG capsule TAKE 1 CAPSULE BY MOUTH THREE TIMES A DAY.   Probiotic Product (PROBIOTIC DAILY PO) Take 1 tablet by mouth daily at 12 noon.   sertraline (ZOLOFT) 25 MG tablet Take 1 tablet (25 mg total) by mouth daily.   tiZANidine (ZANAFLEX) 4 MG tablet Take 1 tablet (4 mg total) by mouth at bedtime as needed for muscle spasms.   Radiology:  No results found.   Cardiac Studies:   Sleep Study  [2011]: Negative for sleep apnea  Coronary Angiogram   01/16/18: Patent Sypher stent in Cx, 2.75 x 15 mm DES placed on 01/22/08. Normal LVEF. NO other significant CAD.  Echocardiogram 08/28/2020:  Left ventricle cavity is normal in size. Severe concentric hypertrophy of  the left ventricle. Normal global wall motion. Normal LV systolic function  with visual EF 55-60%. Doppler evidence of grade I (impaired) diastolic  dysfunction, normal LAP.  Left atrial cavity is mildly dilated.  Trileaflet aortic valve with mild calcification.  Mild aortic stenosis.  Vmax 2.1 m/sec, mean PG 10 mmHg, AVA 1.6 cm2 by continuity equation.  Moderate (Grade II) aortic regurgitation.  Moderate (Grade II) mitral regurgitation. Mildly restricted mitral valve  leaflets.  Moderate tricuspid regurgitation. Estimated pulmonary artery systolic  pressure 39 mmHg.  No significant change compared to previous study in 2020.   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 Duplex suggests stenosis in the right external carotid artery (>50%). Duplex suggests stenosis in the left internal carotid artery (16-49%). Duplex suggests stenosis in the left external carotid artery (<50%). Antegrade right vertebral artery flow. Antegrade left vertebral artery flow. Compared to study done on 05/12/2020, right ICA stenosis of >70% no longer demonstrated.  Otherwise no significant change.  Suspect external carotid  artery stenosis contributed to previous high velocity. Follow up in one year is appropriate if clinically indicated.  EKG  11/02/21 Sinus rhythm at a rate of 65 bpm.  Normal axis. Left atrial enlargement.  No  evidence of ischemia or underlying injury pattern.  Compared to previous EKG, no significant change.   Assessment     ICD-10-CM   1. Coronary artery disease of native artery of native heart with stable angina pectoris ([place])  I25.118 EKG 12-Lead    2. Essential hypertension  I10     3. Carotid stenosis, asymptomatic, bilateral  I65.23       No orders of the defined types were placed in this encounter.   Medications Discontinued During This Encounter  Medication Reason   Magnesium 250 MG TABS    amoxicillin-clavulanate (AUGMENTIN) 875-125 MG tablet    amoxicillin-clavulanate (AUGMENTIN) 875-125 MG tablet     Recommendations:    Kathryn [name]  is a 81 y.o. 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 Coronary Angiography on 01/16/18 that showed no obstructive CAD and patent stents from 2009. Patient is not on statin due to intolerance, she is enrolled in esperion trial.   Patient presents for 28-monthfollow-up. She is stable from a cardiovascular standpoint, therefore no changes were made. On recent carotid artery duplex right ICA stenosis greater than 70%, no longer demonstrated we will therefore plan to follow-up with annual surveillance. Patient remains essentially asymptomatic without recurrence of chest pain. She does continue to have mild dyspnea on exertion which is chronic and stable.  Blood pressure is well controlled. Reviewed external labs, lipids are uncontrolled.  Continue Pravastatin and Zetia. Will order lipid panel.   Physical exam and EKG remain unchanged.  Continue amlodipine, carvedilol, aspirin, Lasix.  Follow-up in 1 year, sooner if needed.    BErnst Spell NP 11/02/2021, 12:00 PM Office: [phone]

## 2021-11-03 ENCOUNTER — Ambulatory Visit: Payer: Self-pay

## 2021-11-03 LAB — LIPID PANEL WITH LDL/HDL RATIO
Cholesterol, Total: 151 mg/dL (ref 100–199)
HDL: 58 mg/dL (ref >39)
LDL Chol Calc (NIH): 69 mg/dL (ref 0–99)
LDL/HDL Ratio: 1.2 ratio (ref 0.0–3.2)
Triglycerides: 139 mg/dL (ref 0–149)
VLDL Cholesterol Cal: 24 mg/dL (ref 5–40)

## 2021-11-03 NOTE — Patient Instructions (Addendum)
Visit Information  Thank you for taking time to visit with me today. Please don't hesitate to contact me if I can be of assistance to you.   Following are the goals we discussed today:   Goals Addressed               This Visit's Progress     Patient Stated     I am having severe muscle cramps that I need evaluated (pt-stated)        Care Coordination Interventions: Provider established cholesterol goals reviewed Review of patient status, including review of consultant's reports, relevant laboratory and other test results Discussed strategies to manage statin-induced myalgias Reviewed exercise goals and target of 150 minutes per week Determined patient started the provider exercise program x 4 weeks ago with good results so far Collaborated with Dr. Baird Cancer regarding patient reports persistent feet and leg cramps Instructed patient to start Magnesium Glycinate 150 mg - 400 mg nightly per Dr. Baird Cancer to help with muscle support and sleep, advised patient may purchase from [place] or health food store, patient verbalizes understanding and recorded the information          Our next appointment is by telephone on 02/02/22 at 12:30 PM   Please call the care guide team at [phone] if you need to cancel or reschedule your appointment.   If you are experiencing a Mental Health or [place] or need someone to talk to, please call [phone]-TALK (toll free, 24 hour hotline)  The patient verbalized understanding of instructions, educational materials, and care plan provided today and agreed to receive a mailed copy of patient instructions, educational materials, and care plan.   Barb Merino, RN, BSN, CCM Care Management Coordinator [facility] Direct Phone: [phone]

## 2021-11-03 NOTE — Patient Outreach (Addendum)
  Care Coordination   Follow Up Visit Note   11/03/2021 Name: Kathryn [name] MRN: [id number] DOB: 10-20-1940  Kathryn [name] is a 81 y.o. year old female who sees Glendale Chard, MD for primary care. I spoke with  Kathryn [name] by phone today.  What matters to the patients health and wellness today?  Patient is happy with her Cholesterol results, she would like to continue her exercise regimen with the provider referral exercise program at the [facility].     Goals Addressed               This Visit's Progress     Patient Stated     I am having severe muscle cramps that I need evaluated (pt-stated)        Care Coordination Interventions: Provider established cholesterol goals reviewed Review of patient status, including review of consultant's reports, relevant laboratory and other test results Discussed strategies to manage statin-induced myalgias Reviewed exercise goals and target of 150 minutes per week Determined patient started the provider exercise program x 4 weeks ago with good results so far Collaborated with Dr. Baird Cancer regarding patient reports persistent feet and leg cramps Instructed patient to start Magnesium Glycinate 150 mg - 400 mg nightly per Dr. Baird Cancer to help with muscle support and sleep, advised patient may purchase from [place] or health food store, patient verbalizes understanding and recorded the information          SDOH assessments and interventions completed:  No     Care Coordination Interventions Activated:  Yes  Care Coordination Interventions:  Yes, provided   Follow up plan: Follow up call scheduled for 02/02/22 '@12'$ :30 PM     Encounter Outcome:  Pt. Visit Completed

## 2021-11-09 ENCOUNTER — Encounter: Payer: Self-pay | Admitting: *Deleted

## 2021-11-09 NOTE — Progress Notes (Signed)
[facility] PREP Weekly Session  Patient Details  Name: Kathryn [name] MRN: [id number] Date of Birth: 12-05-1940 Age: 81 y.o. PCP: Glendale Chard, MD  Vitals:   11/09/21 1605  Weight: 220 lb (99.8 kg)     [facility] Weekly seesion - 11/09/21 1600       [facility] "PREP" Location   [facility] "PREP" Location [facility]      Weekly Session   Topic Discussed Stress management and problem solving   Importance of sleep, introduction to breathing exercise/meditation.   Minutes exercised this week 80 minutes    Classes attended to date [place], [place] 11/09/2021, 4:06 PM

## 2021-11-15 ENCOUNTER — Other Ambulatory Visit: Payer: Self-pay | Admitting: Internal Medicine

## 2021-11-15 ENCOUNTER — Other Ambulatory Visit: Payer: Self-pay | Admitting: Cardiology

## 2021-11-15 DIAGNOSIS — I25118 Atherosclerotic heart disease of native coronary artery with other forms of angina pectoris: Secondary | ICD-10-CM

## 2021-11-16 ENCOUNTER — Encounter: Payer: Self-pay | Admitting: *Deleted

## 2021-11-16 NOTE — Progress Notes (Signed)
[facility] PREP Weekly Session  Patient Details  Name: Kathryn [name] MRN: [id number] Date of Birth: 1940-04-02 Age: 81 y.o. PCP: Glendale Chard, MD  Vitals:   11/16/21 1612  Weight: 220 lb (99.8 kg)     [facility] Weekly seesion - 11/16/21 1600       [facility] "PREP" Location   [facility] "PREP" Location [facility]      Weekly Session   Topic Discussed Expectations and non-scale victories   Half way through program Restate/review goals, discuss barriers,. identify non-scale victories,commonalities of [place]   Minutes exercised this week 90 minutes    Classes attended to date [place] 11/16/2021, 4:14 PM

## 2021-11-23 ENCOUNTER — Encounter: Payer: Self-pay | Admitting: *Deleted

## 2021-11-23 NOTE — Progress Notes (Signed)
[facility] PREP Weekly Session  Patient Details  Name: AUDREE SCHRECENGOST MRN: [id number] Date of Birth: 01/11/1941 Age: 81 y.o. PCP: Glendale Chard, MD  Vitals:   11/23/21 1622  Weight: 220 lb (99.8 kg)     [facility] Weekly seesion - 11/23/21 1600       [facility] "PREP" Location   [facility] "PREP" Location [facility]      Weekly Session   Topic Discussed Other   Portion control, deceptive food labeling, portion size demo.   Minutes exercised this week 50 minutes    Classes attended to date [place], [place] 11/23/2021, 4:24 PM

## 2021-11-30 ENCOUNTER — Encounter: Payer: Self-pay | Admitting: *Deleted

## 2021-11-30 NOTE — Progress Notes (Signed)
[facility] PREP Weekly Session  Patient Details  Name: Kathryn [name] MRN: [id number] Date of Birth: January 27, 1941 Age: 81 y.o. PCP: Glendale Chard, MD  Vitals:   11/30/21 1840  Weight: 218 lb (98.9 kg)     [facility] Weekly seesion - 11/30/21 1600       [facility] "PREP" Location   [facility] "PREP" Location [place]      Weekly Session   Topic Discussed Finding support   Accountability partners, positive support systems, self accountability tips   Minutes exercised this week 90 minutes    Classes attended to date [place], [place] 11/30/2021, 6:42 PM

## 2021-12-07 ENCOUNTER — Encounter: Payer: Self-pay | Admitting: *Deleted

## 2021-12-07 NOTE — Progress Notes (Signed)
[facility] PREP Weekly Session  Patient Details  Name: Kathryn [name] MRN: [id number] Date of Birth: [birth year] Age: 81 y.o. PCP: Glendale Chard, MD  Vitals:   12/07/21 1851  Weight: 219 lb (99.3 kg)     [facility] Weekly seesion - 12/07/21 1600       [facility] "PREP" Location   [facility] "PREP" Location [facility]      Weekly Session   Topic Discussed Calorie breakdown   Review of macronutrients, not all calories are equal. Healthy eating and food preparation at home.   Minutes exercised this week 60 minutes    Classes attended to date [place], [place] 12/07/2021, 6:57 PM

## 2021-12-09 ENCOUNTER — Other Ambulatory Visit: Payer: Self-pay | Admitting: Internal Medicine

## 2021-12-09 DIAGNOSIS — M48061 Spinal stenosis, lumbar region without neurogenic claudication: Secondary | ICD-10-CM

## 2021-12-14 ENCOUNTER — Encounter: Payer: Self-pay | Admitting: *Deleted

## 2021-12-14 NOTE — Progress Notes (Signed)
[facility] PREP Weekly Session  Patient Details  Name: Kathryn [name] MRN: [id number] Date of Birth: 1940-12-11 Age: 81 y.o. PCP: Glendale Chard, MD  Vitals:   12/14/21 1554  Weight: 217 lb (98.4 kg)     [facility] Weekly seesion - 12/14/21 1500       [facility] "PREP" Location   [facility] "PREP" Location [facility]      Weekly Session   Topic Discussed Hitting roadblocks   Ways to stay motivated, accountability tips, some solutions to common roadblocks.   Minutes exercised this week 72 minutes    Classes attended to date [place], [place] 12/14/2021, 4:02 PM

## 2021-12-16 ENCOUNTER — Other Ambulatory Visit: Payer: Self-pay | Admitting: Internal Medicine

## 2021-12-17 ENCOUNTER — Other Ambulatory Visit: Payer: Self-pay | Admitting: Internal Medicine

## 2021-12-17 ENCOUNTER — Other Ambulatory Visit: Payer: Self-pay | Admitting: Cardiology

## 2021-12-21 ENCOUNTER — Other Ambulatory Visit: Payer: Self-pay | Admitting: Cardiology

## 2021-12-23 ENCOUNTER — Other Ambulatory Visit (HOSPITAL_COMMUNITY): Payer: Self-pay | Admitting: Internal Medicine

## 2021-12-23 DIAGNOSIS — Z1231 Encounter for screening mammogram for malignant neoplasm of breast: Secondary | ICD-10-CM

## 2021-12-28 ENCOUNTER — Encounter: Payer: Self-pay | Admitting: *Deleted

## 2021-12-28 NOTE — Progress Notes (Signed)
[facility] PREP Weekly Session  Patient Details  Name: Kathryn [name] MRN: [id number] Date of Birth: Jan 17, 1941 Age: 81 y.o. PCP: Glendale Chard, MD  There were no vitals filed for this visit.   [facility] Weekly seesion - 12/28/21 1600       [facility] "PREP" Location   [facility] "PREP" Location [facility]      Weekly Session   Topic Discussed Other   [facility] membership talk by Kathreen Cornfield and strong survey, Final FIT testing, Goals and Activity Plan.   Classes attended to date [place], [place] 12/28/2021, 4:16 PM

## 2021-12-30 ENCOUNTER — Encounter: Payer: Self-pay | Admitting: *Deleted

## 2021-12-30 DIAGNOSIS — W19XXXA Unspecified fall, initial encounter: Secondary | ICD-10-CM | POA: Diagnosis not present

## 2021-12-30 DIAGNOSIS — M549 Dorsalgia, unspecified: Secondary | ICD-10-CM | POA: Diagnosis not present

## 2021-12-30 NOTE — Progress Notes (Signed)
[facility] PREP Evaluation  Patient Details  Name: Kathryn [name] MRN: [id number] Date of Birth: January 19, 1941 Age: 81 y.o. PCP: Glendale Chard, MD  Vitals:   12/30/21 1616  BP: 138/64  Pulse: 82  Resp: (!) 22  SpO2: 98%  Weight: 221 lb 6.4 oz (100.4 kg)     [facility] Eval - 12/30/21 1600       [facility] "PREP" Location   [facility] "PREP" Location [place]      Referral    Referring Provider [place]    Program Start Date 10/05/21      Measurement   Waist Circumference 53.5 inches    Hip Circumference 54.5 inches    Body fat 43.8 percent      Mobility and Daily Activities   I find it easy to walk up or down two or more flights of stairs. 3    I have no trouble taking out the trash. 4    I do housework such as vacuuming and dusting on my own without difficulty. 3    I can easily lift a gallon of milk (8lbs). 4    I can easily walk a mile. 2    I have no trouble reaching into high cupboards or reaching down to pick up something from the floor. 3    I do not have trouble doing out-door work such as Armed forces logistics/support/administrative officer, raking leaves, or gardening. 1      Mobility and Daily Activities   I feel younger than my age. 4    I feel independent. 4    I feel energetic. 3    I live an active life.  4    I feel strong. 2    I feel healthy. 3    I feel active as other people my age. 4      How fit and strong are you.   Fit and Strong Total Score 44            Past Medical History:  Diagnosis Date   Anxiety    takes Xanax bid prn   Arthritis    Bruises easily    pt is on Plavix   Chronic back pain    buldging disc   Chronic neck pain    Coronary artery disease    1 stent    Dysphagia    Headache(784.0)    related to cervical issues   History of colonic polyps    Hyperlipidemia    takes Crestor daily   Hypertension    takes Coreg and Diovan daily   Myocardial infarction ([place]) 2019   Osteoporosis    was taking shot 2xyr;but not taking anymore   Peripheral vascular  disease (HCC)    Seasonal allergies    takes Zyrtec nightly   Past Surgical History:  Procedure Laterality Date   ABDOMINAL HYSTERECTOMY  70's   BUNIONECTOMY     left foot   CARDIAC CATHETERIZATION  2009/2011   1 stent placed   CATARACT EXTRACTION Left 03/2021   CHOLECYSTECTOMY  2009   COLONOSCOPY     ESOPHAGOGASTRODUODENOSCOPY     LEFT HEART CATH AND CORONARY ANGIOGRAPHY N/A 01/16/2018   Procedure: LEFT HEART CATH AND CORONARY ANGIOGRAPHY;  Surgeon: Nigel Mormon, MD;  Location: [facility];  Service: Cardiovascular;  Laterality: N/A;   TONSILLECTOMY     at age 31   Social History   Tobacco Use  Smoking Status Former   Packs/day: 0.25   Years:  5.00   Total pack years: 1.25   Types: Cigarettes   Quit date: 11/27/1968   Years since quitting: 53.1  Smokeless Tobacco Never    [name] 12/30/2021, 4:22 PM  PREP Complete. Attended 10 of 12 education classes and 9 of 10 exercise classes. Improved Fit and Strong survey by 12 points! Enthusiastic and motivated Patent examiner. States she feels stronger and has improved her stamina. Patient has not accomplished weight loss goals but she claims she feels better prepared to make healthier food choices. Patient reported tripping over her dog and falling on 12/14/21, She was mobile but having discomfort right lower back and hip and left lower leg in class on 12/28/21. Today she is not feeling better and she has not gone to a medical practice to have this evaluated. I strongly ecourgaed her to go to the urgent care for follow up this afternoon.

## 2022-01-05 ENCOUNTER — Ambulatory Visit (HOSPITAL_COMMUNITY): Payer: Medicare Other

## 2022-01-13 ENCOUNTER — Other Ambulatory Visit: Payer: Self-pay | Admitting: Internal Medicine

## 2022-01-19 ENCOUNTER — Ambulatory Visit (HOSPITAL_COMMUNITY)
Admission: RE | Admit: 2022-01-19 | Discharge: 2022-01-19 | Disposition: A | Discharge status: Home / Self Care | Payer: Medicare Other | Source: Ambulatory Visit | Attending: Internal Medicine | Admitting: Internal Medicine

## 2022-01-19 DIAGNOSIS — Z1231 Encounter for screening mammogram for malignant neoplasm of breast: Secondary | ICD-10-CM | POA: Insufficient documentation

## 2022-01-20 ENCOUNTER — Other Ambulatory Visit: Payer: Self-pay | Admitting: Internal Medicine

## 2022-01-26 ENCOUNTER — Telehealth: Payer: Self-pay | Admitting: *Deleted

## 2022-01-26 DIAGNOSIS — I5032 Chronic diastolic (congestive) heart failure: Secondary | ICD-10-CM | POA: Diagnosis not present

## 2022-01-26 DIAGNOSIS — I129 Hypertensive chronic kidney disease with stage 1 through stage 4 chronic kidney disease, or unspecified chronic kidney disease: Secondary | ICD-10-CM | POA: Diagnosis not present

## 2022-01-26 DIAGNOSIS — M4800 Spinal stenosis, site unspecified: Secondary | ICD-10-CM | POA: Diagnosis not present

## 2022-01-26 DIAGNOSIS — M810 Age-related osteoporosis without current pathological fracture: Secondary | ICD-10-CM | POA: Diagnosis not present

## 2022-01-26 DIAGNOSIS — N1831 Chronic kidney disease, stage 3a: Secondary | ICD-10-CM | POA: Diagnosis not present

## 2022-01-26 DIAGNOSIS — R7303 Prediabetes: Secondary | ICD-10-CM | POA: Diagnosis not present

## 2022-01-26 NOTE — Progress Notes (Signed)
  Care Coordination Note  01/26/2022 Name: JANEA SCHWENN MRN: [id number] DOB: 1940-10-12  Kathryn [name] is a 81 y.o. year old female who is a primary care patient of Glendale Chard, MD and is actively engaged with the care management team. I reached out to Kathryn [name] by phone today to assist with re-scheduling a follow up visit with the RN Case Manager  Follow up plan: Unsuccessful telephone outreach attempt made.   [place]  Direct Dial: [phone]

## 2022-02-15 ENCOUNTER — Other Ambulatory Visit: Payer: Self-pay | Admitting: Internal Medicine

## 2022-02-15 ENCOUNTER — Other Ambulatory Visit: Payer: Self-pay | Admitting: Cardiology

## 2022-02-15 NOTE — Progress Notes (Signed)
  Care Coordination Note  02/15/2022 Name: Kathryn [name] MRN: [id number] DOB: 07/08/1940  Kathryn [name] is a 82 y.o. year old female who is a primary care patient of Glendale Chard, MD and is actively engaged with the care management team. I reached out to Kathryn [name] by phone today to assist with re-scheduling a follow up visit with the RN Case Manager  Follow up plan: Telephone appointment with care management team member scheduled for:02/25/22  [place]: [phone]

## 2022-02-21 ENCOUNTER — Other Ambulatory Visit: Payer: Self-pay | Admitting: Internal Medicine

## 2022-02-25 ENCOUNTER — Ambulatory Visit: Payer: Self-pay

## 2022-02-25 NOTE — Patient Outreach (Signed)
  Care Coordination   02/25/2022 Name: Kathryn [name] MRN: [id number] DOB: Nov 01, 1940   Care Coordination Outreach Attempts:  An unsuccessful telephone outreach was attempted for a scheduled appointment today.  Follow Up Plan:  No further outreach attempts will be made at this time. We have been unable to contact the patient to offer or enroll patient in care coordination services  Encounter Outcome:  Pt. Request to Call Back   Care Coordination Interventions:  No, not indicated    Barb Merino, RN, BSN, CCM Care Management Coordinator [place] [facility]  Direct Phone: [phone]

## 2022-03-01 ENCOUNTER — Telehealth: Payer: Self-pay | Admitting: *Deleted

## 2022-03-01 NOTE — Progress Notes (Signed)
  Care Coordination Note  03/01/2022 Name: [name] MRN: [id number] DOB: Jan 01, 1941  Kathryn [name] is a 82 y.o. year old female who is a primary care patient of Glendale Chard, MD and is actively engaged with the care management team. I reached out to Kathryn [name] by phone today to assist with re-scheduling a follow up visit with the RN Case Manager  Follow up plan: Unsuccessful telephone outreach attempt made. A HIPAA compliant phone message was left for the patient providing contact information and requesting a return call.   [place]  Direct Dial: [phone]

## 2022-03-04 NOTE — Progress Notes (Signed)
  Care Coordination Note  03/04/2022 Name: [name] MRN: [id number] DOB: 09/22/1940  [name] is a 82 y.o. year old female who is a primary care patient of Glendale Chard, MD and is actively engaged with the care management team. I reached out to [name] by phone today to assist with re-scheduling a follow up visit with the RN Case Manager  Follow up plan: Telephone appointment with care management team member scheduled for:03/22/22  [place]: [phone]

## 2022-03-22 ENCOUNTER — Ambulatory Visit: Payer: Self-pay

## 2022-03-22 NOTE — Patient Outreach (Signed)
  Care Coordination   Follow Up Visit Note   03/22/2022 Name: Kathryn [name] MRN: [id number] DOB: [birth year]  Kathryn [name] is a 82 y.o. year old female who sees Glendale Chard, MD for primary care. I spoke with  Kathryn [name] by phone today.  What matters to the patients health and wellness today?  Patient will continue to take Magnesium as directed by her PCP. Patient may consider grief counseling.     Goals Addressed               This Visit's Progress     Patient Stated     I am having severe muscle cramps that I need evaluated (pt-stated)        Care Coordination Interventions: Determined patient started Magnesium Glycinate as directed by PCP for severe muscle cramps with very good effectiveness as evidence by patient reports the muscle cramps are less severe and less frequent Encouraged patient to continue to take this medication as directed and keep her PCP informed of her symptoms      Other     To consider grief counseling        Care Coordination Interventions: Evaluation of current treatment plan related to grief and patient's adherence to plan as established by provider Determined patient recently lost her sister suddenly after she received a new CA dx 2 weeks ago Discussed patient is also still grieving over the loss of her nephew from a few months ago Active listening / Reflection utilized  Engineer, petroleum Provided Educated patient regarding the availability of [facility] LCS[facility] for grief counseling Educated patient about grief services offered by [facility] Determined patient will discuss her grief with her PCP and consider starting grief counseling per PCP recommendations Reviewed scheduled/upcoming provider appointment including: next PCP follow up appointment scheduled with Dr. Baird Cancer on 03/23/22 '@11'$ :00 AM         SDOH assessments and interventions completed:  No     Care Coordination Interventions:  Yes, provided   Follow up  plan: Follow up call scheduled for 04/19/22 '@12'$  PM    Encounter Outcome:  Pt. Visit Completed

## 2022-03-22 NOTE — Patient Instructions (Signed)
Visit Information  Thank you for taking time to visit with me today. Please don't hesitate to contact me if I can be of assistance to you.   Following are the goals we discussed today:   Goals Addressed               This Visit's Progress     Patient Stated     I am having severe muscle cramps that I need evaluated (pt-stated)        Care Coordination Interventions: Determined patient started Magnesium Glycinate as directed by PCP for severe muscle cramps with very good effectiveness as evidence by patient reports the muscle cramps are less severe and less frequent Encouraged patient to continue to take this medication as directed and keep her PCP informed of her symptoms      Other     To consider grief counseling        Care Coordination Interventions: Evaluation of current treatment plan related to grief and patient's adherence to plan as established by provider Determined patient recently lost her sister suddenly after she received a new CA dx 2 weeks ago Discussed patient is also still grieving over the loss of her nephew from a few months ago Active listening / Reflection utilized  Engineer, petroleum Provided Educated patient regarding the availability of [facility] LCS[facility] for grief counseling Educated patient about grief services offered by [facility] Determined patient will discuss her grief with her PCP and consider starting grief counseling per PCP recommendations Reviewed scheduled/upcoming provider appointment including: next PCP follow up appointment scheduled with Dr. Baird Cancer on 03/23/22 '@11'$ :00 AM         Our next appointment is by telephone on 04/19/22 at 12 PM  Please call the care guide team at [phone] if you need to cancel or reschedule your appointment.   If you are experiencing a Mental Health or [place] or need someone to talk to, please call 1-800-273-TALK (toll free, 24 hour hotline) go to [facility] [place] [phone])  The patient verbalized understanding of instructions, educational materials, and care plan provided today and DECLINED offer to receive copy of patient instructions, educational materials, and care plan.   Barb Merino, RN, BSN, CCM Care Management Coordinator [facility] Care Management  Direct Phone: [phone]

## 2022-03-23 ENCOUNTER — Encounter: Payer: Self-pay | Admitting: Internal Medicine

## 2022-03-23 ENCOUNTER — Ambulatory Visit (INDEPENDENT_AMBULATORY_CARE_PROVIDER_SITE_OTHER): Payer: Medicare Other | Admitting: Internal Medicine

## 2022-03-23 VITALS — BP 136/62 | HR 78 | Temp 98.0°F | Ht 65.0 in | Wt 219.4 lb

## 2022-03-23 DIAGNOSIS — I25118 Atherosclerotic heart disease of native coronary artery with other forms of angina pectoris: Secondary | ICD-10-CM

## 2022-03-23 DIAGNOSIS — E2839 Other primary ovarian failure: Secondary | ICD-10-CM | POA: Diagnosis not present

## 2022-03-23 DIAGNOSIS — Z0001 Encounter for general adult medical examination with abnormal findings: Secondary | ICD-10-CM

## 2022-03-23 DIAGNOSIS — Z0000 Encounter for general adult medical examination without abnormal findings: Secondary | ICD-10-CM

## 2022-03-23 DIAGNOSIS — R7303 Prediabetes: Secondary | ICD-10-CM | POA: Diagnosis not present

## 2022-03-23 DIAGNOSIS — F4320 Adjustment disorder, unspecified: Secondary | ICD-10-CM

## 2022-03-23 DIAGNOSIS — I1310 Hypertensive heart and chronic kidney disease without heart failure, with stage 1 through stage 4 chronic kidney disease, or unspecified chronic kidney disease: Secondary | ICD-10-CM | POA: Diagnosis not present

## 2022-03-23 DIAGNOSIS — Z23 Encounter for immunization: Secondary | ICD-10-CM

## 2022-03-23 DIAGNOSIS — N1832 Chronic kidney disease, stage 3b: Secondary | ICD-10-CM | POA: Diagnosis not present

## 2022-03-23 DIAGNOSIS — Z79899 Other long term (current) drug therapy: Secondary | ICD-10-CM | POA: Diagnosis not present

## 2022-03-23 DIAGNOSIS — F4321 Adjustment disorder with depressed mood: Secondary | ICD-10-CM

## 2022-03-23 LAB — POCT URINALYSIS DIPSTICK
Bilirubin, UA: NEGATIVE
Blood, UA: NEGATIVE
Glucose, UA: NEGATIVE
Ketones, UA: NEGATIVE
Leukocytes, UA: NEGATIVE
Nitrite, UA: NEGATIVE
Protein, UA: POSITIVE — AB
Spec Grav, UA: >=1.03 — AB (ref 1.010–1.025)
Urobilinogen, UA: 0.2 E.U./dL
pH, UA: 6 (ref 5.0–8.0)

## 2022-03-23 MED ORDER — ALPRAZOLAM 0.25 MG PO TABS: ORAL_TABLET | ORAL | 0 refills | Status: DC

## 2022-03-23 NOTE — Patient Instructions (Signed)

## 2022-03-23 NOTE — Progress Notes (Signed)
I,[name],acting as a scribe for [name], MD.,have documented all relevant documentation on the behalf of [name], MD,as directed by  [name], MD while in the presence of [name], MD.   Subjective:     Patient ID: Kathryn [name] , female    DOB: 1940/09/08 , 83 y.o.   MRN: [id number]   Chief Complaint  Patient presents with   Annual Exam   Hypertension    HPI  Patient is here today for her physical exam.. She is no longer followed by Gyn. She reports compliance with meds. She denies headaches, chest pain and shortness of breath. She adds, she recently lost her sister. Her funeral will be held on Saturday.   Hypertension This is a chronic problem. The current episode started more than 1 year ago. The problem has been gradually improving since onset. The problem is controlled. Pertinent negatives include no blurred vision, chest pain, headaches, palpitations or shortness of breath. Risk factors for coronary artery disease include dyslipidemia, obesity, post-menopausal state and sedentary lifestyle. The current treatment provides moderate improvement. Compliance problems include exercise.  Hypertensive end-organ damage includes kidney disease and CAD/MI.     Past Medical History:  Diagnosis Date   Anxiety    takes Xanax bid prn   Arthritis    Bruises easily    pt is on Plavix   Chronic back pain    buldging disc   Chronic neck pain    Coronary artery disease    1 stent    Dysphagia    Headache(784.0)    related to cervical issues   History of colonic polyps    Hyperlipidemia    takes Crestor daily   Hypertension    takes Coreg and Diovan daily   Myocardial infarction ([place]) 2019   Osteoporosis    was taking shot 2xyr;but not taking anymore   Peripheral vascular disease (HCC)    Seasonal allergies    takes Zyrtec nightly     Family History  Problem Relation Age of Onset   Heart disease Mother        Heart Dissease before  age 58   Heart attack Mother    Cancer Father    Cancer Sister    Leukemia Sister    Cancer Brother    Heart disease Brother        Amputation   Heart attack Brother    Heart disease Brother    Heart attack Brother    Dementia Other    Anesthesia problems Neg Hx    Hypotension Neg Hx    Malignant hyperthermia Neg Hx    Pseudochol deficiency Neg Hx      Current Outpatient Medications:    acetaminophen (TYLENOL) 325 MG tablet, Take 325 mg by mouth every 6 (six) hours as needed for mild pain., Disp: , Rfl:    amLODipine (NORVASC) 10 MG tablet, Take 1 tablet (10 mg total) by mouth daily., Disp: 90 tablet, Rfl: 2   Apoaequorin (PREVAGEN PO), Take 1 tablet by mouth daily., Disp: , Rfl:    Aspirin 81 MG CAPS, , Disp: , Rfl:    CALCIUM PO, Take 1 tablet by mouth daily. 600 mg, Disp: , Rfl:    carvedilol (COREG) 12.5 MG tablet, TAKE (1) TABLET BY MOUTH TWICE DAILY., Disp: 180 tablet, Rfl: 0   cetirizine (ZYRTEC) 10 MG tablet, Take 10 mg by mouth at bedtime., Disp: , Rfl:    diclofenac Sodium (VOLTAREN)  1 % GEL, Apply 4 g topically 4 (four) times daily., Disp: 150 g, Rfl: 1   fluticasone (FLONASE) 50 MCG/ACT nasal spray, Place 2 sprays into both nostrils daily., Disp: 16 g, Rfl: 0   furosemide (LASIX) 20 MG tablet, TAKE 1 TABLET BY MOUTH DAILY, Disp: 90 tablet, Rfl: 0   losartan (COZAAR) 25 MG tablet, Take 1 tablet (25 mg total) by mouth daily., Disp: 90 tablet, Rfl: 2   Multiple Vitamin (MULTIVITAMIN WITH MINERALS) TABS, Take 1 tablet by mouth daily., Disp: , Rfl:    nitroGLYCERIN (NITROSTAT) 0.4 MG SL tablet, Place 1 tablet (0.4 mg total) under the tongue every 5 (five) minutes x 3 doses as needed for chest pain., Disp: 30 tablet, Rfl: 3   pregabalin (LYRICA) 75 MG capsule, TAKE 1 CAPSULE BY MOUTH THREE TIMES A DAY., Disp: 90 capsule, Rfl: 3   Probiotic Product (PROBIOTIC DAILY PO), Take 1 tablet by mouth daily at 12 noon., Disp: , Rfl:    sertraline (ZOLOFT) 25 MG tablet, Take 1 tablet  (25 mg total) by mouth daily., Disp: 90 tablet, Rfl: 2   triamcinolone cream (KENALOG) 0.1 %, Apply 1 Application topically 2 (two) times daily., Disp: 30 g, Rfl: 0   ALPRAZolam (XANAX) 0.25 MG tablet, One tab po qpm prn, Disp: 20 tablet, Rfl: 0   ezetimibe (ZETIA) 10 MG tablet, TAKE 1 TABLET BY MOUTH ONCE A DAY., Disp: 30 tablet, Rfl: 0   famotidine (PEPCID) 20 MG tablet, TAKE ONE TABLET BY MOUTH ONCE DAILY., Disp: 30 tablet, Rfl: 0   pravastatin (PRAVACHOL) 40 MG tablet, Take 1 tablet (40 mg total) by mouth every evening., Disp: 90 tablet, Rfl: 3   tiZANidine (ZANAFLEX) 4 MG tablet, TAKE 1 TABLET BY MOUTH ONCE AT BEDTIME AS NEEDED FOR MUSCLE SPASMS., Disp: 30 tablet, Rfl: 0   Allergies  Allergen Reactions   Codeine Hypertension   Shellfish Allergy       The patient states she uses post menopausal status for birth control. Last LMP was No LMP recorded. Patient has had a hysterectomy.. Negative for Dysmenorrhea. Negative for: breast discharge, breast lump(s), breast pain and breast self exam. Associated symptoms include abnormal vaginal bleeding. Pertinent negatives include abnormal bleeding (hematology), anxiety, decreased libido, depression, difficulty falling sleep, dyspareunia, history of infertility, nocturia, sexual dysfunction, sleep disturbances, urinary incontinence, urinary urgency, vaginal discharge and vaginal itching. Diet regular.The patient states her exercise level is  intermittent.   . The patient's tobacco use is:  Social History   Tobacco Use  Smoking Status Former   Packs/day: 0.25   Years: 5.00   Total pack years: 1.25   Types: Cigarettes   Quit date: 11/27/1968   Years since quitting: 53.3  Smokeless Tobacco Never  . She has been exposed to passive smoke. The patient's alcohol use is:  Social History   Substance and Sexual Activity  Alcohol Use No    Review of Systems  Constitutional: Negative.   HENT: Negative.    Eyes: Negative.  Negative for blurred  vision.  Respiratory: Negative.  Negative for shortness of breath.   Cardiovascular: Negative.  Negative for chest pain and palpitations.  Gastrointestinal: Negative.   Endocrine: Negative.   Genitourinary: Negative.   Musculoskeletal: Negative.   Skin: Negative.   Allergic/Immunologic: Negative.   Neurological: Negative.  Negative for headaches.  Hematological: Negative.   Psychiatric/Behavioral:  Positive for dysphoric mood.      Today's Vitals   03/23/22 1059  BP: 136/62  Pulse: 78  Temp: 98 F (36.7 C)  SpO2: 98%  Weight: 219 lb 6.4 oz (99.5 kg)  Height: '5\' 5"'$  (1.651 m)   Body mass index is 36.51 kg/m.  Wt Readings from Last 3 Encounters:  03/23/22 219 lb 6.4 oz (99.5 kg)  12/30/21 221 lb 6.4 oz (100.4 kg)  12/14/21 217 lb (98.4 kg)    Objective:  Physical Exam Constitutional:      General: She is not in acute distress.    Appearance: Normal appearance. She is well-developed. She is obese.  HENT:     Head: Normocephalic and atraumatic.     Right Ear: Hearing, tympanic membrane, ear canal and external ear normal. There is no impacted cerumen.     Left Ear: Hearing, tympanic membrane, ear canal and external ear normal. There is no impacted cerumen.     Nose:     Comments: Deferred - masked    Mouth/Throat:     Comments: Deferred - masked Eyes:     General: Lids are normal.     Extraocular Movements: Extraocular movements intact.     Conjunctiva/sclera: Conjunctivae normal.     Pupils: Pupils are equal, round, and reactive to light.     Funduscopic exam:    Right eye: No papilledema.        Left eye: No papilledema.  Neck:     Thyroid: No thyroid mass.     Vascular: No carotid bruit.  Cardiovascular:     Rate and Rhythm: Normal rate and regular rhythm.     Pulses:          Dorsalis pedis pulses are 1+ on the right side and 1+ on the left side.     Heart sounds: Normal heart sounds. No murmur heard. Pulmonary:     Effort: Pulmonary effort is normal.      Breath sounds: Normal breath sounds.  Chest:     Chest wall: No mass.  Breasts:    Tanner Score is 5.     Right: Normal. No mass or tenderness.     Left: Normal. No mass or tenderness.  Abdominal:     General: Bowel sounds are normal. There is no distension.     Palpations: Abdomen is soft.     Tenderness: There is no abdominal tenderness.     Comments: Obese, soft  Genitourinary:    Rectum: Guaiac result negative.  Musculoskeletal:        General: No swelling. Normal range of motion.     Cervical back: Full passive range of motion without pain, normal range of motion and neck supple.     Right lower leg: No edema.     Left lower leg: No edema.  Feet:     Right foot:     Skin integrity: Dry skin present.     Left foot:     Skin integrity: Dry skin present.  Lymphadenopathy:     Upper Body:     Right upper body: No supraclavicular, axillary or pectoral adenopathy.     Left upper body: No supraclavicular, axillary or pectoral adenopathy.  Skin:    General: Skin is warm and dry.     Capillary Refill: Capillary refill takes less than 2 seconds.  Neurological:     General: No focal deficit present.     Mental Status: She is alert and oriented to person, place, and time.     Cranial Nerves: No cranial nerve deficit.     Sensory: No sensory deficit.  Psychiatric:  Mood and Affect: Mood normal.        Behavior: Behavior normal.        Thought Content: Thought content normal.        Judgment: Judgment normal.       Assessment And Plan:     1. Encounter for general adult medical examination w/o abnormal findings Comments: A full exam was performed. Importance of monthly self breast exams was discussed with the patient.  PATIENT IS ADVISED TO GET 30-45 MINUTES REGULAR EXERCISE NO LESS THAN FOUR TO FIVE DAYS PER WEEK - BOTH WEIGHTBEARING EXERCISES AND AEROBIC ARE RECOMMENDED.  PATIENT IS ADVISED TO FOLLOW A HEALTHY DIET WITH AT LEAST SIX FRUITS/VEGGIES PER DAY, DECREASE INTAKE  OF RED MEAT, AND TO INCREASE FISH INTAKE TO TWO DAYS PER WEEK.  MEATS/FISH SHOULD NOT BE FRIED, BAKED OR BROILED IS PREFERABLE.  IT IS ALSO IMPORTANT TO CUT BACK ON YOUR SUGAR INTAKE. PLEASE AVOID ANYTHING WITH ADDED SUGAR, CORN SYRUP OR OTHER SWEETENERS. IF YOU MUST USE A SWEETENER, YOU CAN TRY STEVIA. IT IS ALSO IMPORTANT TO AVOID ARTIFICIALLY SWEETENERS AND DIET BEVERAGES. LASTLY, I SUGGEST WEARING SPF 50 SUNSCREEN ON EXPOSED PARTS AND ESPECIALLY WHEN IN THE DIRECT SUNLIGHT FOR AN EXTENDED PERIOD OF TIME.  PLEASE AVOID FAST FOOD RESTAURANTS AND INCREASE YOUR WATER INTAKE.  2. Hypertensive heart and renal disease with renal failure, stage 1 through stage 4 or unspecified chronic kidney disease, without heart failure Comments: Chronic, fair control. Goal BP<130/80. No med changes today. EKG: NSR w/o acute changes. She will c/w losartan '25mg'$ , furosemide '20mg'$ , carvedilol 12.'5mg'$  bid and amlodipine '10mg'$  daily. She is encouraged to aim for at least 150 minutes of exercise/week. She will f/u in 4-6 months for re-evaluation.  - POCT Urinalysis Dipstick (81002) - Microalbumin / creatinine urine ratio - EKG 12-Lead - CMP14+EGFR - CBC  3. Coronary artery disease of native artery of native heart with stable angina pectoris ([employer]) Comments: Chronic, LDL goal < 70. She is on ASA and Bblocker. Unfortunately, she has not been compliant with statin therapy. She is currently in research study;therefore, I will defer management to Cardiology.   4. Stage 3b chronic kidney disease ([place]) Comments: Chronic, reminded to avoid NSAIDS, stay well hydrated and keep BP well controlled. to decrease risk of CKD progression.  5. Estrogen deficiency Comments: She agrees to bone density, encouraged to engage in weight-bearing exercises at least two to three days per week. - DG Bone Density; Future  6. Prediabetes Comments: Review of previous labs show elevated a1c. I will check an a1c today, encouraged to decrease her intake of  sugary beverages/foods and processed meats. - CMP14+EGFR - CBC - Hemoglobin A1c  7. Grief reaction Comments: She declines grief counselng at this time. - ALPRAZolam (XANAX) 0.25 MG tablet; One tab po qpm prn  Dispense: 20 tablet; Refill: 0  8. Drug therapy - Vitamin B12  9. Immunization due - Flu Vaccine QUAD High Dose(Fluad)  Patient was given opportunity to ask questions. Patient verbalized understanding of the plan and was able to repeat key elements of the plan. All questions were answered to their satisfaction.   I, [name], MD, have reviewed all documentation for this visit. The documentation on 03/23/22 for the exam, diagnosis, procedures, and orders are all accurate and complete.   THE PATIENT IS ENCOURAGED TO PRACTICE SOCIAL DISTANCING DUE TO THE COVID-19 PANDEMIC.

## 2022-03-24 ENCOUNTER — Other Ambulatory Visit: Payer: Self-pay | Admitting: Internal Medicine

## 2022-03-24 ENCOUNTER — Other Ambulatory Visit: Payer: Self-pay | Admitting: Cardiology

## 2022-03-24 LAB — CMP14+EGFR
ALT: 6 IU/L (ref 0–32)
AST: 11 IU/L (ref 0–40)
Albumin/Globulin Ratio: 1.9 (ref 1.2–2.2)
Albumin: 4.4 g/dL (ref 3.7–4.7)
Alkaline Phosphatase: 164 IU/L — ABNORMAL HIGH (ref 44–121)
BUN/Creatinine Ratio: 11 — ABNORMAL LOW (ref 12–28)
BUN: 13 mg/dL (ref 8–27)
Bilirubin Total: 0.3 mg/dL (ref 0.0–1.2)
CO2: 22 mmol/L (ref 20–29)
Calcium: 9.3 mg/dL (ref 8.7–10.3)
Chloride: 104 mmol/L (ref 96–106)
Creatinine, Ser: 1.22 mg/dL — ABNORMAL HIGH (ref 0.57–1.00)
Globulin, Total: 2.3 g/dL (ref 1.5–4.5)
Glucose: 94 mg/dL (ref 70–99)
Potassium: 4.6 mmol/L (ref 3.5–5.2)
Sodium: 142 mmol/L (ref 134–144)
Total Protein: 6.7 g/dL (ref 6.0–8.5)
eGFR: 44 mL/min/{1.73_m2} — ABNORMAL LOW (ref >59)

## 2022-03-24 LAB — CBC
Hematocrit: 39.3 % (ref 34.0–46.6)
Hemoglobin: 11.9 g/dL (ref 11.1–15.9)
MCH: 22.8 pg — ABNORMAL LOW (ref 26.6–33.0)
MCHC: 30.3 g/dL — ABNORMAL LOW (ref 31.5–35.7)
MCV: 75 fL — ABNORMAL LOW (ref 79–97)
Platelets: 445 10*3/uL (ref 150–450)
RBC: 5.21 x10E6/uL (ref 3.77–5.28)
RDW: 15.3 % (ref 11.7–15.4)
WBC: 9.4 10*3/uL (ref 3.4–10.8)

## 2022-03-24 LAB — HEMOGLOBIN A1C
Est. average glucose Bld gHb Est-mCnc: 117 mg/dL
Hgb A1c MFr Bld: 5.7 % — ABNORMAL HIGH (ref 4.8–5.6)

## 2022-03-24 LAB — VITAMIN B12: Vitamin B-12: 514 pg/mL (ref 232–1245)

## 2022-03-25 LAB — MICROALBUMIN / CREATININE URINE RATIO
Creatinine, Urine: 271.3 mg/dL
Microalb/Creat Ratio: 73 mg/g creat — ABNORMAL HIGH (ref 0–29)
Microalbumin, Urine: 198.7 ug/mL

## 2022-03-28 ENCOUNTER — Other Ambulatory Visit: Payer: Self-pay | Admitting: Internal Medicine

## 2022-03-28 DIAGNOSIS — Z79899 Other long term (current) drug therapy: Secondary | ICD-10-CM

## 2022-03-28 MED ORDER — LOSARTAN POTASSIUM 25 MG PO TABS: 25.0000 mg | ORAL_TABLET | Freq: Every day | ORAL | 2 refills | Status: DC

## 2022-04-02 DIAGNOSIS — B9689 Other specified bacterial agents as the cause of diseases classified elsewhere: Secondary | ICD-10-CM | POA: Diagnosis not present

## 2022-04-02 DIAGNOSIS — R509 Fever, unspecified: Secondary | ICD-10-CM | POA: Diagnosis not present

## 2022-04-02 DIAGNOSIS — J208 Acute bronchitis due to other specified organisms: Secondary | ICD-10-CM | POA: Diagnosis not present

## 2022-04-02 DIAGNOSIS — R059 Cough, unspecified: Secondary | ICD-10-CM | POA: Diagnosis not present

## 2022-04-13 ENCOUNTER — Ambulatory Visit: Payer: Medicare Other

## 2022-04-13 DIAGNOSIS — I6523 Occlusion and stenosis of bilateral carotid arteries: Secondary | ICD-10-CM | POA: Diagnosis not present

## 2022-04-19 ENCOUNTER — Ambulatory Visit: Payer: Medicare Other

## 2022-04-19 ENCOUNTER — Ambulatory Visit: Payer: Self-pay

## 2022-04-19 VITALS — BP 134/70 | HR 89 | Temp 98.5°F | Ht 65.0 in | Wt 219.0 lb

## 2022-04-19 DIAGNOSIS — Z79899 Other long term (current) drug therapy: Secondary | ICD-10-CM

## 2022-04-19 DIAGNOSIS — I10 Essential (primary) hypertension: Secondary | ICD-10-CM

## 2022-04-19 NOTE — Patient Outreach (Signed)
  Care Coordination   04/19/2022 Name: Kathryn [name] MRN: [id number] DOB: [birth year]   Care Coordination Outreach Attempts:  An unsuccessful telephone outreach was attempted for a scheduled appointment today.  Follow Up Plan:  Additional outreach attempts will be made to offer the patient care coordination information and services.   Encounter Outcome:  No Answer   Care Coordination Interventions:  No, not indicated    Barb Merino, RN, BSN, CCM Care Management Coordinator [facility] Care Management Direct Phone: [phone]

## 2022-04-19 NOTE — Progress Notes (Signed)
Patient presents today for bp check and labs. Patient currently taking amlodipine '10mg'$  PM and losartan '25mg'$ . BP Readings from Last 3 Encounters:  04/19/22 (!) 142/70  03/23/22 136/62  12/30/21 138/64  Per provider- continue with current medications and work out.

## 2022-04-20 ENCOUNTER — Other Ambulatory Visit: Payer: Self-pay | Admitting: Internal Medicine

## 2022-04-20 ENCOUNTER — Other Ambulatory Visit: Payer: Self-pay | Admitting: Cardiology

## 2022-04-20 LAB — BMP8+EGFR
BUN/Creatinine Ratio: 11 — ABNORMAL LOW (ref 12–28)
BUN: 11 mg/dL (ref 8–27)
CO2: 23 mmol/L (ref 20–29)
Calcium: 9 mg/dL (ref 8.7–10.3)
Chloride: 107 mmol/L — ABNORMAL HIGH (ref 96–106)
Creatinine, Ser: 1.03 mg/dL — ABNORMAL HIGH (ref 0.57–1.00)
Glucose: 102 mg/dL — ABNORMAL HIGH (ref 70–99)
Potassium: 4.2 mmol/L (ref 3.5–5.2)
Sodium: 145 mmol/L — ABNORMAL HIGH (ref 134–144)
eGFR: 54 mL/min/{1.73_m2} — ABNORMAL LOW (ref >59)

## 2022-04-21 DIAGNOSIS — Z800 Family history of malignant neoplasm of digestive organs: Secondary | ICD-10-CM | POA: Insufficient documentation

## 2022-04-21 DIAGNOSIS — R1033 Periumbilical pain: Secondary | ICD-10-CM | POA: Insufficient documentation

## 2022-04-21 DIAGNOSIS — Z860100 Personal history of colon polyps, unspecified: Secondary | ICD-10-CM | POA: Insufficient documentation

## 2022-04-21 DIAGNOSIS — M609 Myositis, unspecified: Secondary | ICD-10-CM | POA: Insufficient documentation

## 2022-04-21 DIAGNOSIS — R1310 Dysphagia, unspecified: Secondary | ICD-10-CM | POA: Insufficient documentation

## 2022-04-21 DIAGNOSIS — K5730 Diverticulosis of large intestine without perforation or abscess without bleeding: Secondary | ICD-10-CM | POA: Insufficient documentation

## 2022-04-21 DIAGNOSIS — R194 Change in bowel habit: Secondary | ICD-10-CM | POA: Insufficient documentation

## 2022-04-28 ENCOUNTER — Ambulatory Visit: Payer: Self-pay

## 2022-04-28 ENCOUNTER — Other Ambulatory Visit: Payer: Self-pay

## 2022-04-28 DIAGNOSIS — I25118 Atherosclerotic heart disease of native coronary artery with other forms of angina pectoris: Secondary | ICD-10-CM

## 2022-04-28 DIAGNOSIS — H04123 Dry eye syndrome of bilateral lacrimal glands: Secondary | ICD-10-CM | POA: Diagnosis not present

## 2022-04-28 DIAGNOSIS — H01002 Unspecified blepharitis right lower eyelid: Secondary | ICD-10-CM | POA: Diagnosis not present

## 2022-04-28 DIAGNOSIS — H01001 Unspecified blepharitis right upper eyelid: Secondary | ICD-10-CM | POA: Diagnosis not present

## 2022-04-28 DIAGNOSIS — H16223 Keratoconjunctivitis sicca, not specified as Sjogren's, bilateral: Secondary | ICD-10-CM | POA: Diagnosis not present

## 2022-04-28 NOTE — Telephone Encounter (Signed)
Angel from [employer] called to report patient having mild chest pain right side close to center of chest. Patient needs nitro called into pharmacy.

## 2022-04-28 NOTE — Patient Outreach (Addendum)
  Care Coordination   Follow Up Visit Note   04/28/2022 Name: Kathryn [name] MRN: [id number] DOB: 06/20/1940  Kathryn [name] is a 82 y.o. year old female who sees Glendale Chard, MD for primary care. I spoke with  Kathryn [name] by phone today.  What matters to the patients health and wellness today?  Patient will contact her Cardiologist or call 911 if her chest pain persist or worsens.     Goals Addressed             This Visit's Progress    To consider grief counseling       Care Coordination Interventions: Evaluation of current treatment plan related to grief and patient's adherence to plan as established by provider Active listening / Reflection utilized  Emotional Support Provided Discussed with patient although she continues to be grieving over the loss of her sister and several loved ones, she feels she is effectively coping and declines needing to seek professional help at this time Reiterated the availability of LCSW through [facility] as well as outside help through [facility] patient to keep her doctor well informed on new symptoms or concerns     To evaluate chest pain       Care Coordination Interventions: Determined patient has been experiencing dull intermittent chest pain near the center of but more on the right side of her chest that start 2 days ago Assessed for other symptoms, patient denies  Provided education on importance of blood pressure control in management of CAD Counseled on importance of regular laboratory monitoring as prescribed Reviewed Importance of taking all medications as prescribed; determined patient's Nitroglycerin is expired Advised patient this RN will contact her Cardiologist to report her symptoms and request a new Rx for NTG Instructed patient to call 911 if symptoms persist or worsen Reviewed Importance of attending all scheduled provider appointments Placed outbound call to [facility]  [facility], left a detailed voice message for Dr. Wende Crease nurse advising of patient's reported symptoms and request to follow up with patient, advised patient's Nitroglycerine is expired and requested a new Rx be sent       Interventions Today    Flowsheet Row Most Recent Value  Chronic Disease   Chronic disease during today's visit Congestive Heart Failure (CHF), Hypertension (HTN), Other  [Angina]  General Interventions   General Interventions Discussed/Reviewed General Interventions Discussed, General Interventions Reviewed, Doctor Visits  Doctor Visits Discussed/Reviewed Specialist  [[facility]]  PCP/Specialist Visits Compliance with follow-up visit  Education Interventions   Education Provided Provided Education  Provided Verbal Education On Medication, When to see the doctor  Pharmacy Interventions   Pharmacy Dicussed/Reviewed Medications and their functions          SDOH assessments and interventions completed:  No     Care Coordination Interventions:  Yes, provided   Follow up plan: Follow up call scheduled for 05/04/22 @12 :30 PM    Encounter Outcome:  Pt. Visit Completed

## 2022-04-28 NOTE — Patient Instructions (Signed)
Visit Information  Thank you for taking time to visit with me today. Please don't hesitate to contact me if I can be of assistance to you.   Following are the goals we discussed today:   Goals Addressed             This Visit's Progress    To consider grief counseling       Care Coordination Interventions: Evaluation of current treatment plan related to grief and patient's adherence to plan as established by provider Active listening / Reflection utilized  Emotional Support Provided Discussed with patient although she continues to be grieving over the loss of her sister and several loved ones, she feels she is effectively coping and declines needing to seek professional help at this time Reiterated the availability of LCSW through [facility] as well as outside help through [facility] patient to keep her doctor well informed on new symptoms or concerns     To evaluate chest pain       Care Coordination Interventions: Determined patient has been experiencing dull intermittent chest pain near the center of but more on the right side of her chest that start 2 days ago Assessed for other symptoms, patient denies  Provided education on importance of blood pressure control in management of CAD Counseled on importance of regular laboratory monitoring as prescribed Reviewed Importance of taking all medications as prescribed; determined patient's Nitroglycerin is expired Advised patient this RN will contact her Cardiologist to report her symptoms and request a new Rx for NTG Instructed patient to call 911 if symptoms persist or worsen Reviewed Importance of attending all scheduled provider appointments Placed outbound call to [facility], left a detailed voice message for Dr. Wende Crease nurse advising of patient's reported symptoms and request to follow up with patient, advised patient's Nitroglycerine is expired and requested a new Rx be sent            Our next appointment is by telephone on 05/04/22 at 12:30 PM  Please call the care guide team at [phone] if you need to cancel or reschedule your appointment.   If you are experiencing a Mental Health or [place] or need someone to talk to, please call 1-800-273-TALK (toll free, 24 hour hotline) go to [facility] [place] [phone])  The patient verbalized understanding of instructions, educational materials, and care plan provided today and DECLINED offer to receive copy of patient instructions, educational materials, and care plan.   Barb Merino, RN, BSN, CCM Care Management Coordinator [facility] Management  Direct Phone: [phone]

## 2022-04-29 MED ORDER — NITROGLYCERIN 0.4 MG SL SUBL: 0.4000 mg | SUBLINGUAL_TABLET | SUBLINGUAL | 3 refills | Status: AC | PRN

## 2022-05-04 ENCOUNTER — Ambulatory Visit: Payer: Self-pay

## 2022-05-04 NOTE — Patient Instructions (Signed)
Visit Information  Thank you for taking time to visit with me today. Please don't hesitate to contact me if I can be of assistance to you.   Following are the goals we discussed today:   Goals Addressed             This Visit's Progress    COMPLETED: To evaluate chest pain       Care Coordination Interventions: Evaluation of current treatment plan related to angina  and patient's adherence to plan as established by provider Confirmed patient's Cardiologist refilled her NTG and patient has a new Rx on hand at home Determined patient has not needed to use NTG due to she states her chest pain has subsided, she feels it was stress related due to recent grief  Instructed patient to contact her doctor for new or worsening symptoms         To have arthritis pain and back pain with Sciatica evaluated by orthopedics       Care Coordination Interventions: Reviewed provider established plan for pain management Discussed importance of adherence to all scheduled medical appointments Counseled on the importance of reporting any/all new or changed pain symptoms or management strategies to pain management provider Advised patient to report to care team affect of pain on daily activities Reviewed with patient prescribed pharmacological and nonpharmacological pain relief strategies Advised patient to discuss referral for outpatient PT with provider           Our next appointment is by telephone on 06/20/22 at 09:30 AM  Please call the care guide team at [phone] if you need to cancel or reschedule your appointment.   If you are experiencing a Mental Health or [place] or need someone to talk to, please call 1-800-273-TALK (toll free, 24 hour hotline) go to [facility] [place] [phone])  The patient verbalized understanding of instructions, educational materials, and care plan provided today and DECLINED offer to  receive copy of patient instructions, educational materials, and care plan.   Barb Merino, RN, BSN, CCM Care Management Coordinator [facility] Care Management Direct Phone: [phone]

## 2022-05-04 NOTE — Patient Outreach (Signed)
  Care Coordination   Follow Up Visit Note   05/04/2022 Name: Kathryn [name] MRN: [id number] DOB: Jun 09, 1940  Kathryn [name] is a 82 y.o. year old female who sees Glendale Chard, MD for primary care. I spoke with  Kathryn [name] by phone today.  What matters to the patients health and wellness today?  Patient will schedule a follow up with Orthopedics to evaluate her back pain with sciatica.     Goals Addressed             This Visit's Progress    COMPLETED: To evaluate chest pain       Care Coordination Interventions: Evaluation of current treatment plan related to angina  and patient's adherence to plan as established by provider Confirmed patient's Cardiologist refilled her NTG and patient has a new Rx on hand at home Determined patient has not needed to use NTG due to she states her chest pain has subsided, she feels it was stress related due to recent grief  Instructed patient to contact her doctor for new or worsening symptoms         To have arthritis pain and back pain with Sciatica evaluated by orthopedics       Care Coordination Interventions: Reviewed provider established plan for pain management Discussed importance of adherence to all scheduled medical appointments Counseled on the importance of reporting any/all new or changed pain symptoms or management strategies to pain management provider Advised patient to report to care team affect of pain on daily activities Reviewed with patient prescribed pharmacological and nonpharmacological pain relief strategies Advised patient to discuss referral for outpatient PT with provider       Interventions Today    Flowsheet Row Most Recent Value  Chronic Disease   Chronic disease during today's visit Congestive Heart Failure (CHF), Other  [Angina]  General Interventions   General Interventions Discussed/Reviewed General Interventions Discussed, General Interventions Reviewed  Doctor Visits  Discussed/Reviewed Specialist  Education Interventions   Education Provided Provided Education  Provided Verbal Education On Medication, When to see the doctor  Pharmacy Interventions   Pharmacy Dicussed/Reviewed Pharmacy Topics Discussed, Medications and their functions          SDOH assessments and interventions completed:  No     Care Coordination Interventions:  Yes, provided   Follow up plan: Follow up call scheduled for 06/20/22 @09 :30 AM    Encounter Outcome:  Pt. Visit Completed

## 2022-05-05 ENCOUNTER — Other Ambulatory Visit: Payer: Self-pay | Admitting: Internal Medicine

## 2022-05-05 DIAGNOSIS — I25118 Atherosclerotic heart disease of native coronary artery with other forms of angina pectoris: Secondary | ICD-10-CM

## 2022-05-17 DIAGNOSIS — H01001 Unspecified blepharitis right upper eyelid: Secondary | ICD-10-CM | POA: Diagnosis not present

## 2022-05-17 DIAGNOSIS — H04123 Dry eye syndrome of bilateral lacrimal glands: Secondary | ICD-10-CM | POA: Diagnosis not present

## 2022-05-17 DIAGNOSIS — H01002 Unspecified blepharitis right lower eyelid: Secondary | ICD-10-CM | POA: Diagnosis not present

## 2022-05-17 DIAGNOSIS — H16223 Keratoconjunctivitis sicca, not specified as Sjogren's, bilateral: Secondary | ICD-10-CM | POA: Diagnosis not present

## 2022-05-20 ENCOUNTER — Ambulatory Visit (HOSPITAL_COMMUNITY)
Admission: RE | Admit: 2022-05-20 | Discharge: 2022-05-20 | Disposition: A | Discharge status: Home / Self Care | Payer: Medicare Other | Source: Ambulatory Visit | Attending: Internal Medicine | Admitting: Internal Medicine

## 2022-05-20 DIAGNOSIS — M810 Age-related osteoporosis without current pathological fracture: Secondary | ICD-10-CM | POA: Diagnosis not present

## 2022-05-20 DIAGNOSIS — E2839 Other primary ovarian failure: Secondary | ICD-10-CM | POA: Diagnosis not present

## 2022-05-21 ENCOUNTER — Other Ambulatory Visit: Payer: Self-pay | Admitting: Internal Medicine

## 2022-05-21 DIAGNOSIS — M816 Localized osteoporosis [Lequesne]: Secondary | ICD-10-CM

## 2022-05-23 ENCOUNTER — Other Ambulatory Visit: Payer: Self-pay | Admitting: Internal Medicine

## 2022-05-23 DIAGNOSIS — M7071 Other bursitis of hip, right hip: Secondary | ICD-10-CM | POA: Diagnosis not present

## 2022-05-23 DIAGNOSIS — I1310 Hypertensive heart and chronic kidney disease without heart failure, with stage 1 through stage 4 chronic kidney disease, or unspecified chronic kidney disease: Secondary | ICD-10-CM

## 2022-06-01 DIAGNOSIS — M7071 Other bursitis of hip, right hip: Secondary | ICD-10-CM | POA: Diagnosis not present

## 2022-06-06 ENCOUNTER — Other Ambulatory Visit: Payer: Self-pay | Admitting: Internal Medicine

## 2022-06-09 ENCOUNTER — Ambulatory Visit (INDEPENDENT_AMBULATORY_CARE_PROVIDER_SITE_OTHER): Payer: Medicare Other | Admitting: Internal Medicine

## 2022-06-09 ENCOUNTER — Ambulatory Visit (INDEPENDENT_AMBULATORY_CARE_PROVIDER_SITE_OTHER): Payer: Medicare Other

## 2022-06-09 ENCOUNTER — Other Ambulatory Visit: Payer: Self-pay | Admitting: Internal Medicine

## 2022-06-09 ENCOUNTER — Encounter: Payer: Self-pay | Admitting: Internal Medicine

## 2022-06-09 VITALS — BP 126/60 | HR 72 | Temp 98.2°F | Ht 61.0 in | Wt 219.0 lb

## 2022-06-09 VITALS — BP 126/60 | HR 72 | Temp 98.3°F | Ht 61.8 in | Wt 219.2 lb

## 2022-06-09 DIAGNOSIS — N1832 Chronic kidney disease, stage 3b: Secondary | ICD-10-CM

## 2022-06-09 DIAGNOSIS — I6523 Occlusion and stenosis of bilateral carotid arteries: Secondary | ICD-10-CM

## 2022-06-09 DIAGNOSIS — N1831 Chronic kidney disease, stage 3a: Secondary | ICD-10-CM | POA: Diagnosis not present

## 2022-06-09 DIAGNOSIS — Z0000 Encounter for general adult medical examination without abnormal findings: Secondary | ICD-10-CM

## 2022-06-09 DIAGNOSIS — I1310 Hypertensive heart and chronic kidney disease without heart failure, with stage 1 through stage 4 chronic kidney disease, or unspecified chronic kidney disease: Secondary | ICD-10-CM

## 2022-06-09 DIAGNOSIS — I25118 Atherosclerotic heart disease of native coronary artery with other forms of angina pectoris: Secondary | ICD-10-CM

## 2022-06-09 DIAGNOSIS — M4807 Spinal stenosis, lumbosacral region: Secondary | ICD-10-CM | POA: Diagnosis not present

## 2022-06-09 DIAGNOSIS — Z6841 Body Mass Index (BMI) 40.0 and over, adult: Secondary | ICD-10-CM

## 2022-06-09 DIAGNOSIS — R7303 Prediabetes: Secondary | ICD-10-CM

## 2022-06-09 MED ORDER — HYDROCODONE-ACETAMINOPHEN 5-325 MG PO TABS: 1.0000 | ORAL_TABLET | Freq: Four times a day (QID) | ORAL | 0 refills | Status: DC | PRN

## 2022-06-09 NOTE — Patient Instructions (Signed)
Kathryn [name] , Thank you for taking time to come for your Medicare Wellness Visit. I appreciate your ongoing commitment to your health goals. Please review the following plan we discussed and let me know if I can assist you in the future.   These are the goals we discussed:  Goals       I am having severe muscle cramps that I need evaluated (pt-stated)      Care Coordination Interventions: Determined patient started Magnesium Glycinate as directed by PCP for severe muscle cramps with very good effectiveness as evidence by patient reports the muscle cramps are less severe and less frequent Encouraged patient to continue to take this medication as directed and keep her PCP informed of her symptoms      Patient Stated      05/28/2020, wants to weigh 160 pounds      Patient Stated      06/02/2021, wants to get right leg stronger      Patient Stated      06/09/2022, wants to lose weight      To consider grief counseling      Care Coordination Interventions: Evaluation of current treatment plan related to grief and patient's adherence to plan as established by provider Active listening / Reflection utilized  Emotional Support Provided Discussed with patient although she continues to be grieving over the loss of her sister and several loved ones, she feels she is effectively coping and declines needing to seek professional help at this time Reiterated the availability of LCSW through [facility] as well as outside help through [facility] Instructed patient to keep her doctor well informed on new symptoms or concerns      To have arthritis pain and back pain with Sciatica evaluated by orthopedics      Care Coordination Interventions: Reviewed provider established plan for pain management Discussed importance of adherence to all scheduled medical appointments Counseled on the importance of reporting any/all new or changed pain symptoms or management strategies to pain  management provider Advised patient to report to care team affect of pain on daily activities Reviewed with patient prescribed pharmacological and nonpharmacological pain relief strategies Advised patient to discuss referral for outpatient PT with provider         Weight (lb) < 200 lb (90.7 kg)      11/15/2018, wants to get below 200 pounds      Weight (lb) < 200 lb (90.7 kg)      05/23/2019, wants to weigh 175-185        This is a list of the screening recommended for you and due dates:  Health Maintenance  Topic Date Due   COVID-19 Vaccine (7 - 2023-24 season) 01/28/2022   Flu Shot  09/15/2022   DTaP/Tdap/Td vaccine (2 - Tdap) 10/26/2022   Medicare Annual Wellness Visit  06/09/2023   Pneumonia Vaccine  Completed   DEXA scan (bone density measurement)  Completed   Zoster (Shingles) Vaccine  Completed   HPV Vaccine  Aged Out    Advanced directives: Please bring a copy of your POA (Power of [place]) and/or Living Will to your next appointment.   Conditions/risks identified: none  Next appointment: Follow up in one year for your annual wellness visit    Preventive Care 65 Years and Older, Female Preventive care refers to lifestyle choices and visits with your health care provider that can promote health and wellness. What does preventive care include? A yearly physical exam. This  is also called an annual well check. Dental exams once or twice a year. Routine eye exams. Ask your health care provider how often you should have your eyes checked. Personal lifestyle choices, including: Daily care of your teeth and gums. Regular physical activity. Eating a healthy diet. Avoiding tobacco and drug use. Limiting alcohol use. Practicing safe sex. Taking low-dose aspirin every day. Taking vitamin and mineral supplements as recommended by your health care provider. What happens during an annual well check? The services and screenings done by your health care provider during your  annual well check will depend on your age, overall health, lifestyle risk factors, and family history of disease. Counseling  Your health care provider may ask you questions about your: Alcohol use. Tobacco use. Drug use. Emotional well-being. Home and relationship well-being. Sexual activity. Eating habits. History of falls. Memory and ability to understand (cognition). Work and work Astronomer. Reproductive health. Screening  You may have the following tests or measurements: Height, weight, and BMI. Blood pressure. Lipid and cholesterol levels. These may be checked every 5 years, or more frequently if you are over 25 years old. Skin check. Lung cancer screening. You may have this screening every year starting at age 37 if you have a 30-pack-year history of smoking and currently smoke or have quit within the past 15 years. Fecal occult blood test (FOBT) of the stool. You may have this test every year starting at age 34. Flexible sigmoidoscopy or colonoscopy. You may have a sigmoidoscopy every 5 years or a colonoscopy every 10 years starting at age 80. Hepatitis C blood test. Hepatitis B blood test. Sexually transmitted disease (STD) testing. Diabetes screening. This is done by checking your blood sugar (glucose) after you have not eaten for a while (fasting). You may have this done every 1-3 years. Bone density scan. This is done to screen for osteoporosis. You may have this done starting at age 68. Mammogram. This may be done every 1-2 years. Talk to your health care provider about how often you should have regular mammograms. Talk with your health care provider about your test results, treatment options, and if necessary, the need for more tests. Vaccines  Your health care provider may recommend certain vaccines, such as: Influenza vaccine. This is recommended every year. Tetanus, diphtheria, and acellular pertussis (Tdap, Td) vaccine. You may need a Td booster every 10  years. Zoster vaccine. You may need this after age 48. Pneumococcal 13-valent conjugate (PCV13) vaccine. One dose is recommended after age 57. Pneumococcal polysaccharide (PPSV23) vaccine. One dose is recommended after age 5. Talk to your health care provider about which screenings and vaccines you need and how often you need them. This information is not intended to replace advice given to you by your health care provider. Make sure you discuss any questions you have with your health care provider. Document Released: 02/27/2015 Document Revised: 10/21/2015 Document Reviewed: 12/02/2014 Elsevier Interactive Patient Education  2017 [employer].  Fall Prevention in the Home Falls can cause injuries. They can happen to people of all ages. There are many things you can do to make your home safe and to help prevent falls. What can I do on the outside of my home? Regularly fix the edges of walkways and driveways and fix any cracks. Remove anything that might make you trip as you walk through a door, such as a raised step or threshold. Trim any bushes or trees on the path to your home. Use bright outdoor lighting. Clear any  walking paths of anything that might make someone trip, such as rocks or tools. Regularly check to see if handrails are loose or broken. Make sure that both sides of any steps have handrails. Any raised decks and porches should have guardrails on the edges. Have any leaves, snow, or ice cleared regularly. Use sand or salt on walking paths during winter. Clean up any spills in your garage right away. This includes oil or grease spills. What can I do in the bathroom? Use night lights. Install grab bars by the toilet and in the tub and shower. Do not use towel bars as grab bars. Use non-skid mats or decals in the tub or shower. If you need to sit down in the shower, use a plastic, non-slip stool. Keep the floor dry. Clean up any water that spills on the floor as soon as it  happens. Remove soap buildup in the tub or shower regularly. Attach bath mats securely with double-sided non-slip rug tape. Do not have throw rugs and other things on the floor that can make you trip. What can I do in the bedroom? Use night lights. Make sure that you have a light by your bed that is easy to reach. Do not use any sheets or blankets that are too big for your bed. They should not hang down onto the floor. Have a firm chair that has side arms. You can use this for support while you get dressed. Do not have throw rugs and other things on the floor that can make you trip. What can I do in the kitchen? Clean up any spills right away. Avoid walking on wet floors. Keep items that you use a lot in easy-to-reach places. If you need to reach something above you, use a strong step stool that has a grab bar. Keep electrical cords out of the way. Do not use floor polish or wax that makes floors slippery. If you must use wax, use non-skid floor wax. Do not have throw rugs and other things on the floor that can make you trip. What can I do with my stairs? Do not leave any items on the stairs. Make sure that there are handrails on both sides of the stairs and use them. Fix handrails that are broken or loose. Make sure that handrails are as long as the stairways. Check any carpeting to make sure that it is firmly attached to the stairs. Fix any carpet that is loose or worn. Avoid having throw rugs at the top or bottom of the stairs. If you do have throw rugs, attach them to the floor with carpet tape. Make sure that you have a light switch at the top of the stairs and the bottom of the stairs. If you do not have them, ask someone to add them for you. What else can I do to help prevent falls? Wear shoes that: Do not have high heels. Have rubber bottoms. Are comfortable and fit you well. Are closed at the toe. Do not wear sandals. If you use a stepladder: Make sure that it is fully opened.  Do not climb a closed stepladder. Make sure that both sides of the stepladder are locked into place. Ask someone to hold it for you, if possible. Clearly mark and make sure that you can see: Any grab bars or handrails. First and last steps. Where the edge of each step is. Use tools that help you move around (mobility aids) if they are needed. These include: Canes. Walkers. Scooters.  Crutches. Turn on the lights when you go into a dark area. Replace any light bulbs as soon as they burn out. Set up your furniture so you have a clear path. Avoid moving your furniture around. If any of your floors are uneven, fix them. If there are any pets around you, be aware of where they are. Review your medicines with your doctor. Some medicines can make you feel dizzy. This can increase your chance of falling. Ask your doctor what other things that you can do to help prevent falls. This information is not intended to replace advice given to you by your health care provider. Make sure you discuss any questions you have with your health care provider. Document Released: 11/27/2008 Document Revised: 07/09/2015 Document Reviewed: 03/07/2014 Elsevier Interactive Patient Education  2017 [employer].

## 2022-06-09 NOTE — Progress Notes (Signed)
Subjective:   Kathryn [name] is a 82 y.o. female who presents for Medicare Annual (Subsequent) preventive examination.  Review of Systems     Cardiac Risk Factors include: advanced age (>69men, >67 women);dyslipidemia;hypertension;obesity (BMI >30kg/m2)     Objective:    Today's Vitals   06/09/22 1115 06/09/22 1125  BP: 126/60   Pulse: 72   Temp: 98.3 F (36.8 C)   TempSrc: Oral   SpO2: 96%   Weight: 219 lb 3.2 oz (99.4 kg)   Height: 5' 1.8" (1.57 m)   PainSc:  10-Worst pain ever   Body mass index is 40.35 kg/m.     06/09/2022   11:31 AM 06/02/2021   11:04 AM 04/27/2021    1:51 PM 04/16/2021   10:13 AM 04/12/2021    9:34 AM 04/12/2021    9:29 AM 03/26/2021   10:17 AM  Advanced Directives  Does Patient Have a Medical Advance Directive? Yes Yes Yes Yes Yes Yes Yes  Type of Estate agent of [place];Living will Healthcare Power of [place];Living will Living will;Healthcare Power of Attorney Living will;Healthcare Power of Attorney     Does patient want to make changes to medical advance directive?     No - Patient declined  No - Patient declined  Copy of Healthcare Power of Attorney in Chart? No - copy requested No - copy requested No - copy requested No - copy requested     Would patient like information on creating a medical advance directive?       No - Patient declined    Current Medications (verified) Outpatient Encounter Medications as of 06/09/2022  Medication Sig   acetaminophen (TYLENOL) 325 MG tablet Take 325 mg by mouth every 6 (six) hours as needed for mild pain.   amLODipine (NORVASC) 10 MG tablet TAKE ONE TABLET BY MOUTH ONCE DAILY.   Apoaequorin (PREVAGEN PO) Take 1 tablet by mouth daily.   Aspirin 81 MG CAPS    CALCIUM PO Take 1 tablet by mouth daily. 600 mg   carvedilol (COREG) 12.5 MG tablet TAKE (1) TABLET BY MOUTH TWICE DAILY.   cetirizine (ZYRTEC) 10 MG tablet Take 10 mg by mouth at bedtime.   diclofenac Sodium (VOLTAREN) 1 %  GEL Apply 4 g topically 4 (four) times daily.   ezetimibe (ZETIA) 10 MG tablet TAKE 1 TABLET BY MOUTH ONCE A DAY.   famotidine (PEPCID) 20 MG tablet TAKE ONE TABLET BY MOUTH ONCE DAILY.   fluticasone (FLONASE) 50 MCG/ACT nasal spray Place 2 sprays into both nostrils daily.   furosemide (LASIX) 20 MG tablet TAKE 1 TABLET BY MOUTH DAILY   losartan (COZAAR) 25 MG tablet Take 1 tablet (25 mg total) by mouth daily.   Multiple Vitamin (MULTIVITAMIN WITH MINERALS) TABS Take 1 tablet by mouth daily.   nitroGLYCERIN (NITROSTAT) 0.4 MG SL tablet Place 1 tablet (0.4 mg total) under the tongue every 5 (five) minutes x 3 doses as needed for chest pain.   NOREL AD 4-10-325 MG TABS Take 1 tablet by mouth 4 (four) times daily.   pregabalin (LYRICA) 75 MG capsule TAKE 1 CAPSULE BY MOUTH THREE TIMES A DAY.   Probiotic Product (PROBIOTIC DAILY PO) Take 1 tablet by mouth daily at 12 noon.   sertraline (ZOLOFT) 25 MG tablet Take 1 tablet (25 mg total) by mouth daily.   tiZANidine (ZANAFLEX) 4 MG tablet TAKE 1 TABLET BY MOUTH ONCE AT BEDTIME AS NEEDED FOR MUSCLE SPASMS.   triamcinolone cream (KENALOG) 0.1 %  Apply 1 Application topically 2 (two) times daily.   ALPRAZolam (XANAX) 0.25 MG tablet One tab po qpm prn (Patient not taking: Reported on 06/09/2022)   pravastatin (PRAVACHOL) 40 MG tablet Take 1 tablet (40 mg total) by mouth every evening.   No facility-administered encounter medications on file as of 06/09/2022.    Allergies (verified) Codeine and Shellfish allergy   History: Past Medical History:  Diagnosis Date   Anxiety    takes Xanax bid prn   Arthritis    Bruises easily    pt is on Plavix   Chronic back pain    buldging disc   Chronic neck pain    Coronary artery disease    1 stent    Dysphagia    Headache(784.0)    related to cervical issues   History of colonic polyps    Hyperlipidemia    takes Crestor daily   Hypertension    takes Coreg and Diovan daily   Myocardial infarction 2019    Osteoporosis    was taking shot 2xyr;but not taking anymore   Peripheral vascular disease    Seasonal allergies    takes Zyrtec nightly   Past Surgical History:  Procedure Laterality Date   ABDOMINAL HYSTERECTOMY  70's   BUNIONECTOMY     left foot   CARDIAC CATHETERIZATION  2009/2011   1 stent placed   CATARACT EXTRACTION Left 03/2021   CHOLECYSTECTOMY  2009   COLONOSCOPY     ESOPHAGOGASTRODUODENOSCOPY     LEFT HEART CATH AND CORONARY ANGIOGRAPHY N/A 01/16/2018   Procedure: LEFT HEART CATH AND CORONARY ANGIOGRAPHY;  Surgeon: Elder Negus, MD;  Location: [facility];  Service: Cardiovascular;  Laterality: N/A;   TONSILLECTOMY     at age 48   Family History  Problem Relation Age of Onset   Heart disease Mother        Heart Dissease before age 73   Heart attack Mother    Cancer Father    Cancer Sister    Leukemia Sister    Cancer Brother    Heart disease Brother        Amputation   Heart attack Brother    Heart disease Brother    Heart attack Brother    Dementia Other    Anesthesia problems Neg Hx    Hypotension Neg Hx    Malignant hyperthermia Neg Hx    Pseudochol deficiency Neg Hx    Social History   Socioeconomic History   Marital status: Married    Spouse name: Not on file   Number of children: 4   Years of education: Not on file   Highest education level: Not on file  Occupational History   Occupation: retired  Tobacco Use   Smoking status: Former    Packs/day: 0.25    Years: 5.00    Additional pack years: 0.00    Total pack years: 1.25    Types: Cigarettes    Quit date: 11/27/1968    Years since quitting: 53.5   Smokeless tobacco: Never  Vaping Use   Vaping Use: Never used  Substance and Sexual Activity   Alcohol use: No   Drug use: No   Sexual activity: Not Currently  Other Topics Concern   Not on file  Social History Narrative   Not on file   Social Determinants of Health   Financial Resource Strain: Low Risk   (06/09/2022)   Overall Financial Resource Strain (CARDIA)    Difficulty of  Paying Living Expenses: Not hard at all  Food Insecurity: No Food Insecurity (06/09/2022)   Hunger Vital Sign    Worried About Running Out of Food in the Last Year: Never true    Ran Out of Food in the Last Year: Never true  Transportation Needs: No Transportation Needs (06/09/2022)   PRAPARE - Administrator, Civil Service (Medical): No    Lack of Transportation (Non-Medical): No  Physical Activity: Inactive (06/09/2022)   Exercise Vital Sign    Days of Exercise per Week: 0 days    Minutes of Exercise per Session: 0 min  Stress: No Stress Concern Present (06/09/2022)   [employer] of Occupational Health - Occupational Stress Questionnaire    Feeling of Stress : Only a little  Social Connections: Socially Integrated (05/24/2018)   Social Connection and Isolation Panel [NHANES]    Frequency of Communication with Friends and Family: More than three times a week    Frequency of Social Gatherings with Friends and Family: More than three times a week    Attends Religious Services: More than 4 times per year    Active Member of [employer] or Organizations: Yes    Attends Engineer, structural: More than 4 times per year    Marital Status: Married    Tobacco Counseling Counseling given: Not Answered   Clinical Intake:  Pre-visit preparation completed: Yes  Pain : 0-10 Pain Score: 10-Worst pain ever Pain Type: Chronic pain Pain Location: Hip Pain Orientation: Right Pain Descriptors / Indicators: Aching Pain Onset: More than a month ago Pain Frequency: Constant Pain Relieving Factors: APAP helps a little  Pain Relieving Factors: APAP helps a little  Nutritional Status: BMI > 30  Obese Nutritional Risks: Nausea/ vomitting/ diarrhea (nausea comes with pain sometimes) Diabetes: No  How often do you need to have someone help you when you read instructions, pamphlets, or other written  materials from your doctor or pharmacy?: 1 - Never  Diabetic? no  Interpreter Needed?: No  Information entered by :: NAllen LPN   Activities of Daily Living    06/09/2022   11:32 AM  In your present state of health, do you have any difficulty performing the following activities:  Hearing? 0  Vision? 0  Difficulty concentrating or making decisions? 0  Walking or climbing stairs? 1  Dressing or bathing? 0  Doing errands, shopping? 0  Preparing Food and eating ? N  Using the Toilet? N  In the past six months, have you accidently leaked urine? N  Do you have problems with loss of bowel control? N  Managing your Medications? N  Managing your Finances? N  Housekeeping or managing your Housekeeping? Y    Patient Care Team: [name], MD as PCP - General (Internal Medicine) Yates Decamp, MD as Attending Physician (Cardiology) Clarene Duke, Karma Lew, RN as [facility] Care Management  Indicate any recent Medical Services you may have received from other than [facility] providers in the past year (date may be approximate).     Assessment:   This is a routine wellness examination for Mark[place].  Hearing/Vision screen Vision Screening - Comments:: Regular eye exams, [facility]  Dietary issues and exercise activities discussed: Current Exercise Habits: The patient does not participate in regular exercise at present   Goals Addressed             This Visit's Progress    Patient Stated       06/09/2022, wants to lose  weight       Depression Screen    06/09/2022   11:32 AM 03/23/2022   11:03 AM 08/26/2021    2:34 PM 06/02/2021   11:05 AM 05/28/2020   10:48 AM 05/23/2019    2:22 PM 12/18/2018   11:07 AM  PHQ 2/9 Scores  PHQ - 2 Score 0 0 0 0 0 0 0    Fall Risk    06/09/2022   11:31 AM 03/23/2022   11:03 AM 08/26/2021    2:33 PM 06/02/2021   11:05 AM 05/28/2020   10:48 AM  Fall Risk   Falls in the past year? 1 1 0 0 1  Comment tripped    tripped  Number falls in  past yr: 0 0 0 0 0  Injury with Fall? 0 0 0 0 0  Risk for fall due to : Impaired mobility;Impaired balance/gait;Medication side effect History of fall(s)  Medication side effect Medication side effect  Follow up Falls prevention discussed;Education provided;Falls evaluation completed Falls evaluation completed Falls evaluation completed Falls evaluation completed;Education provided;Falls prevention discussed Falls evaluation completed;Education provided;Falls prevention discussed    FALL RISK PREVENTION PERTAINING TO THE HOME:  Any stairs in or around the home? Yes  If so, are there any without handrails? No  Home free of loose throw rugs in walkways, pet beds, electrical cords, etc? Yes  Adequate lighting in your home to reduce risk of falls? Yes   ASSISTIVE DEVICES UTILIZED TO PREVENT FALLS:  Life alert? No  Use of a cane, walker or w/c? Yes  Grab bars in the bathroom? Yes  Shower chair or bench in shower? Yes  Elevated toilet seat or a handicapped toilet? Yes   TIMED UP AND GO:  Was the test performed? Yes .  Length of time to ambulate 10 feet: 8 sec.   Gait slow and steady with assistive device  Cognitive Function:        06/09/2022   11:33 AM 06/02/2021   11:08 AM 05/28/2020   10:49 AM 05/23/2019    2:23 PM 11/15/2018   11:32 AM  6CIT Screen  What Year? 0 points 0 points 0 points 0 points 0 points  What month? 0 points 0 points 0 points 3 points 0 points  What time? 3 points 0 points 0 points 0 points 0 points  Count back from 20 2 points 0 points 0 points 0 points 0 points  Months in reverse 0 points 0 points 2 points 0 points 0 points  Repeat phrase 4 points 4 points 10 points 2 points 4 points  Total Score 9 points 4 points 12 points 5 points 4 points    Immunizations Immunization History  Administered Date(s) Administered   Covid-19, Mrna,Vaccine(Spikevax)54yrs and older 12/03/2021   DTaP 10/25/2012   Fluad Quad(high Dose 65+) 12/03/2019, 11/19/2020, 03/23/2022    Influenza, High Dose Seasonal PF 12/21/2017, 11/15/2018   Moderna Covid-19 Vaccine Bivalent Booster 65yrs & up 11/27/2020   PFIZER(Purple Top)SARS-COV-2 Vaccination 03/01/2019, 03/22/2019, 11/13/2019, 06/09/2020   Pneumococcal Conjugate-13 07/15/2013   Pneumococcal Polysaccharide-23 03/12/2012   Zoster Recombinat (Shingrix) 11/01/2018, 02/06/2019    TDAP status: Up to date  Flu Vaccine status: Up to date  Pneumococcal vaccine status: Up to date  Covid-19 vaccine status: Completed vaccines  Qualifies for Shingles Vaccine? Yes   Zostavax completed Yes   Shingrix Completed?: Yes  Screening Tests Health Maintenance  Topic Date Due   COVID-19 Vaccine (7 - 2023-24 season) 01/28/2022   Medicare Annual Wellness (  AWV)  06/03/2022   INFLUENZA VACCINE  09/15/2022   DTaP/Tdap/Td (2 - Tdap) 10/26/2022   Pneumonia Vaccine 28+ Years old  Completed   DEXA SCAN  Completed   Zoster Vaccines- Shingrix  Completed   HPV VACCINES  Aged Out    Health Maintenance  Health Maintenance Due  Topic Date Due   COVID-19 Vaccine (7 - 2023-24 season) 01/28/2022   Medicare Annual Wellness (AWV)  06/03/2022    Colorectal cancer screening: No longer required.   Mammogram status: Completed 01/19/2022. Repeat every year  Bone Density status: Completed 05/20/2022  Lung Cancer Screening: (Low Dose CT Chest recommended if Age 2-80 years, 30 pack-year currently smoking OR have quit w/in 15years.) does not qualify.   Lung Cancer Screening Referral: no  Additional Screening:  Hepatitis C Screening: does not qualify;   Vision Screening: Recommended annual ophthalmology exams for early detection of glaucoma and other disorders of the eye. Is the patient up to date with their annual eye exam?  Yes  Who is the provider or what is the name of the office in which the patient attends annual eye exams? [facility] If pt is not established with a provider, would they like to be referred to a provider to  establish care? No .   Dental Screening: Recommended annual dental exams for proper oral hygiene  Community Resource Referral / Chronic Care Management: CRR required this visit?  No   CCM required this visit?  No      Plan:     I have personally reviewed and noted the following in the patient's chart:   Medical and social history Use of alcohol, tobacco or illicit drugs  Current medications and supplements including opioid prescriptions. Patient is not currently taking opioid prescriptions. Functional ability and status Nutritional status Physical activity Advanced directives List of other physicians Hospitalizations, surgeries, and ER visits in previous 12 months Vitals Screenings to include cognitive, depression, and falls Referrals and appointments  In addition, I have reviewed and discussed with patient certain preventive protocols, quality metrics, and best practice recommendations. A written personalized care plan for preventive services as well as general preventive health recommendations were provided to patient.     Barb Merino, LPN   09/24/9145   Nurse Notes: none

## 2022-06-09 NOTE — Progress Notes (Addendum)
Hershal Coria Martin,acting as a Neurosurgeon for [name], MD.,have documented all relevant documentation on the behalf of [name], MD,as directed by  [name], MD while in the presence of [name], MD.    Subjective:     Patient ID: Kathryn [name] , female    DOB: 1940-04-10 , 82 y.o.   MRN: [id number]   Chief Complaint  Patient presents with   Hypertension    HPI  Patient presents today for bp check. Patient reports compliance with her meds. She denies headaches, chest pain and shortness of breath. Patient states she is having a lot of pain, she reports getting a shot for her pain in her left leg that only lasted 2 days, then the pain comes back. She is followed by Dr. Modesto Charon.   She also has AWV scheduled with [facility].   BP Readings from Last 3 Encounters: 06/09/22 : 126/60 06/09/22 : 126/60 04/19/22 : 134/70    Hypertension This is a chronic problem. The current episode started more than 1 year ago. The problem has been gradually improving since onset. The problem is controlled. Pertinent negatives include no blurred vision, chest pain, palpitations or shortness of breath. Risk factors for coronary artery disease include dyslipidemia, obesity, post-menopausal state and sedentary lifestyle. The current treatment provides moderate improvement. Compliance problems include exercise.  Hypertensive end-organ damage includes kidney disease and CAD/MI.     Past Medical History:  Diagnosis Date   Anxiety    takes Xanax bid prn   Arthritis    Bruises easily    pt is on Plavix   Chronic back pain    buldging disc   Chronic neck pain    Coronary artery disease    1 stent    Dysphagia    Headache(784.0)    related to cervical issues   History of colonic polyps    Hyperlipidemia    takes Crestor daily   Hypertension    takes Coreg and Diovan daily   Myocardial infarction (HCC) 2019   Osteoporosis    was taking shot 2xyr;but not taking anymore    Peripheral vascular disease (HCC)    Seasonal allergies    takes Zyrtec nightly     Family History  Problem Relation Age of Onset   Heart disease Mother        Heart Dissease before age 7   Heart attack Mother    Cancer Father    Cancer Sister    Leukemia Sister    Cancer Brother    Heart disease Brother        Amputation   Heart attack Brother    Heart disease Brother    Heart attack Brother    Dementia Other    Anesthesia problems Neg Hx    Hypotension Neg Hx    Malignant hyperthermia Neg Hx    Pseudochol deficiency Neg Hx      Current Outpatient Medications:    HYDROcodone-acetaminophen (NORCO/VICODIN) 5-325 MG tablet, Take 1 tablet by mouth every 6 (six) hours as needed for moderate pain., Disp: 20 tablet, Rfl: 0   acetaminophen (TYLENOL) 325 MG tablet, Take 325 mg by mouth every 6 (six) hours as needed for mild pain., Disp: , Rfl:    ALPRAZolam (XANAX) 0.25 MG tablet, One tab po qpm prn (Patient not taking: Reported on 06/09/2022), Disp: 20 tablet, Rfl: 0   amLODipine (NORVASC) 10 MG tablet, TAKE ONE TABLET BY MOUTH ONCE DAILY., Disp: 90 tablet,  Rfl: 0   Apoaequorin (PREVAGEN PO), Take 1 tablet by mouth daily., Disp: , Rfl:    Aspirin 81 MG CAPS, , Disp: , Rfl:    CALCIUM PO, Take 1 tablet by mouth daily. 600 mg, Disp: , Rfl:    carvedilol (COREG) 12.5 MG tablet, TAKE (1) TABLET BY MOUTH TWICE DAILY., Disp: 180 tablet, Rfl: 0   cetirizine (ZYRTEC) 10 MG tablet, Take 10 mg by mouth at bedtime., Disp: , Rfl:    diclofenac Sodium (VOLTAREN) 1 % GEL, Apply 4 g topically 4 (four) times daily., Disp: 150 g, Rfl: 1   ezetimibe (ZETIA) 10 MG tablet, TAKE 1 TABLET BY MOUTH ONCE A DAY., Disp: 90 tablet, Rfl: 3   famotidine (PEPCID) 20 MG tablet, TAKE ONE TABLET BY MOUTH ONCE DAILY., Disp: 30 tablet, Rfl: 2   fluticasone (FLONASE) 50 MCG/ACT nasal spray, Place 2 sprays into both nostrils daily., Disp: 16 g, Rfl: 0   furosemide (LASIX) 20 MG tablet, TAKE 1 TABLET BY MOUTH DAILY,  Disp: 90 tablet, Rfl: 0   losartan (COZAAR) 25 MG tablet, Take 1 tablet (25 mg total) by mouth daily., Disp: 90 tablet, Rfl: 2   Multiple Vitamin (MULTIVITAMIN WITH MINERALS) TABS, Take 1 tablet by mouth daily., Disp: , Rfl:    nitroGLYCERIN (NITROSTAT) 0.4 MG SL tablet, Place 1 tablet (0.4 mg total) under the tongue every 5 (five) minutes x 3 doses as needed for chest pain., Disp: 30 tablet, Rfl: 3   NOREL AD 4-10-325 MG TABS, Take 1 tablet by mouth 4 (four) times daily., Disp: , Rfl:    pravastatin (PRAVACHOL) 40 MG tablet, Take 1 tablet (40 mg total) by mouth every evening., Disp: 90 tablet, Rfl: 3   pregabalin (LYRICA) 75 MG capsule, TAKE 1 CAPSULE BY MOUTH THREE TIMES A DAY., Disp: 90 capsule, Rfl: 3   Probiotic Product (PROBIOTIC DAILY PO), Take 1 tablet by mouth daily at 12 noon., Disp: , Rfl:    sertraline (ZOLOFT) 25 MG tablet, Take 1 tablet (25 mg total) by mouth daily., Disp: 90 tablet, Rfl: 2   tiZANidine (ZANAFLEX) 4 MG tablet, TAKE 1 TABLET BY MOUTH ONCE AT BEDTIME AS NEEDED FOR MUSCLE SPASMS., Disp: 30 tablet, Rfl: 0   triamcinolone cream (KENALOG) 0.1 %, Apply 1 Application topically 2 (two) times daily., Disp: 30 g, Rfl: 0   Allergies  Allergen Reactions   Codeine Hypertension   Shellfish Allergy      Review of Systems  Constitutional: Negative.   Eyes:  Negative for blurred vision.  Respiratory: Negative.  Negative for shortness of breath.   Cardiovascular: Negative.  Negative for chest pain and palpitations.  Gastrointestinal: Negative.   Musculoskeletal:  Positive for arthralgias and back pain.  Skin: Negative.   Neurological: Negative.   Psychiatric/Behavioral: Negative.       Today's Vitals   06/09/22 1144  BP: 126/60  Pulse: 72  Temp: 98.2 F (36.8 C)  TempSrc: Oral  Weight: 219 lb (99.3 kg)  Height: 5\' 1"  (1.549 m)  PainSc: 10-Worst pain ever  PainLoc: Leg   Body mass index is 41.38 kg/m.  Wt Readings from Last 3 Encounters:  06/09/22 219 lb (99.3  kg)  06/09/22 219 lb 3.2 oz (99.4 kg)  04/19/22 219 lb (99.3 kg)   BP Readings from Last 3 Encounters:  06/09/22 126/60  06/09/22 126/60  04/19/22 134/70    Objective:  Physical Exam Vitals and nursing note reviewed.  Constitutional:      Appearance: Normal  appearance. She is obese.  HENT:     Head: Normocephalic and atraumatic.  Eyes:     Extraocular Movements: Extraocular movements intact.  Cardiovascular:     Rate and Rhythm: Normal rate and regular rhythm.     Heart sounds: Normal heart sounds.  Pulmonary:     Effort: Pulmonary effort is normal.     Breath sounds: Normal breath sounds.  Musculoskeletal:     Cervical back: Normal range of motion.     Comments: Ambulatory with cane  Skin:    General: Skin is warm.  Neurological:     General: No focal deficit present.     Mental Status: She is alert.  Psychiatric:        Mood and Affect: Mood normal.        Behavior: Behavior normal.     Assessment And Plan:     1. 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 She will c/w carvedilol 12.5mg  bid, amlodipine 10mg , and losartan 25mg  daily.  She is encouraged to follow low sodium diet.  2. Carotid stenosis, asymptomatic, bilateral Comments: Chronic, importance of compliance with ASA and statin therapy was stressed to the patient.  3. Coronary artery disease of native artery of native heart with stable angina pectoris (HCC) Comments: Chronic, LDL goal < 70. Cardiology input appreciated, recent notes reviewed. She will c/w statin, ASA and Bblocker therapy.  4. Spinal stenosis of lumbosacral region Comments: Chronic, I will refer her to Pain clinic for further evaluation. - Ambulatory referral to Pain Clinic  5. Class 3 severe obesity due to excess calories with serious comorbidity and body mass index (BMI) of 40.0 to 44.9 in adult [facility]) Comments: BMI 41. She is encouraged to  perform chair exercises while watching TV, aiming for 30 minutes five days per week.   Patient was given opportunity to ask questions. Patient verbalized understanding of the plan and was able to repeat key elements of the plan. All questions were answered to their satisfaction.   I, [name], MD, have reviewed all documentation for this visit. The documentation on 06/12/22 for the exam, diagnosis, procedures, and orders are all accurate and complete.   IF YOU HAVE BEEN REFERRED TO A SPECIALIST, IT MAY TAKE 1-2 WEEKS TO SCHEDULE/PROCESS THE REFERRAL. IF YOU HAVE NOT HEARD FROM US/SPECIALIST IN TWO WEEKS, PLEASE GIVE [place] A CALL AT [phone].   THE PATIENT IS ENCOURAGED TO PRACTICE SOCIAL DISTANCING DUE TO THE COVID-19 PANDEMIC.

## 2022-06-09 NOTE — Patient Instructions (Signed)
Hypertension, Adult ?Hypertension is another name for high blood pressure. High blood pressure forces your heart to work harder to pump blood. This can cause problems over time. ?There are two numbers in a blood pressure reading. There is a top number (systolic) over a bottom number (diastolic). It is best to have a blood pressure that is below 120/80. ?What are the causes? ?The cause of this condition is not known. Some other conditions can lead to high blood pressure. ?What increases the risk? ?Some lifestyle factors can make you more likely to develop high blood pressure: ?Smoking. ?Not getting enough exercise or physical activity. ?Being overweight. ?Having too much fat, sugar, calories, or salt (sodium) in your diet. ?Drinking too much alcohol. ?Other risk factors include: ?Having any of these conditions: ?Heart disease. ?Diabetes. ?High cholesterol. ?Kidney disease. ?Obstructive sleep apnea. ?Having a family history of high blood pressure and high cholesterol. ?Age. The risk increases with age. ?Stress. ?What are the signs or symptoms? ?High blood pressure may not cause symptoms. Very high blood pressure (hypertensive crisis) may cause: ?Headache. ?Fast or uneven heartbeats (palpitations). ?Shortness of breath. ?Nosebleed. ?Vomiting or feeling like you may vomit (nauseous). ?Changes in how you see. ?Very bad chest pain. ?Feeling dizzy. ?Seizures. ?How is this treated? ?This condition is treated by making healthy lifestyle changes, such as: ?Eating healthy foods. ?Exercising more. ?Drinking less alcohol. ?Your doctor may prescribe medicine if lifestyle changes do not help enough and if: ?Your top number is above 130. ?Your bottom number is above 80. ?Your personal target blood pressure may vary. ?Follow these instructions at home: ?Eating and drinking ? ?If told, follow the DASH eating plan. To follow this plan: ?Fill one half of your plate at each meal with fruits and vegetables. ?Fill one fourth of your plate  at each meal with whole grains. Whole grains include whole-wheat pasta, brown rice, and whole-grain bread. ?Eat or drink low-fat dairy products, such as skim milk or low-fat yogurt. ?Fill one fourth of your plate at each meal with low-fat (lean) proteins. Low-fat proteins include fish, chicken without skin, eggs, beans, and tofu. ?Avoid fatty meat, cured and processed meat, or chicken with skin. ?Avoid pre-made or processed food. ?Limit the amount of salt in your diet to less than 1,500 mg each day. ?Do not drink alcohol if: ?Your doctor tells you not to drink. ?You are pregnant, may be pregnant, or are planning to become pregnant. ?If you drink alcohol: ?Limit how much you have to: ?0-1 drink a day for women. ?0-2 drinks a day for men. ?Know how much alcohol is in your drink. In the [place] one drink equals one 12 oz bottle of beer (355 mL), one 5 oz glass of wine (148 mL), or one 1? oz glass of hard liquor (44 mL). ?Lifestyle ? ?Work with your doctor to stay at a healthy weight or to lose weight. Ask your doctor what the best weight is for you. ?Get at least 30 minutes of exercise that causes your heart to beat faster (aerobic exercise) most days of the week. This may include walking, swimming, or biking. ?Get at least 30 minutes of exercise that strengthens your muscles (resistance exercise) at least 3 days a week. This may include lifting weights or doing Pilates. ?Do not smoke or use any products that contain nicotine or tobacco. If you need help quitting, ask your doctor. ?Check your blood pressure at home as told by your doctor. ?Keep all follow-up visits. ?Medicines ?Take over-the-counter and prescription medicines   only as told by your doctor. Follow directions carefully. ?Do not skip doses of blood pressure medicine. The medicine does not work as well if you skip doses. Skipping doses also puts you at risk for problems. ?Ask your doctor about side effects or reactions to medicines that you should watch  for. ?Contact a doctor if: ?You think you are having a reaction to the medicine you are taking. ?You have headaches that keep coming back. ?You feel dizzy. ?You have swelling in your ankles. ?You have trouble with your vision. ?Get help right away if: ?You get a very bad headache. ?You start to feel mixed up (confused). ?You feel weak or numb. ?You feel faint. ?You have very bad pain in your: ?Chest. ?Belly (abdomen). ?You vomit more than once. ?You have trouble breathing. ?These symptoms may be an emergency. Get help right away. Call 911. ?Do not wait to see if the symptoms will go away. ?Do not drive yourself to the hospital. ?Summary ?Hypertension is another name for high blood pressure. ?High blood pressure forces your heart to work harder to pump blood. ?For most people, a normal blood pressure is less than 120/80. ?Making healthy choices can help lower blood pressure. If your blood pressure does not get lower with healthy choices, you may need to take medicine. ?This information is not intended to replace advice given to you by your health care provider. Make sure you discuss any questions you have with your health care provider. ?Document Revised: 11/19/2020 Document Reviewed: 11/19/2020 ?Elsevier Patient Education ? 2023 [employer] ? ?

## 2022-06-15 NOTE — Progress Notes (Signed)
Office Visit Note  Patient: Kathryn [name]             Date of Birth: [birth year]           MRN: [id number]             PCP: [name], MD Referring: [name], MD Visit Date: 06/29/2022 Occupation: @GUAROCC @  Subjective:  Discuss osteoporosis treatment options   History of Present Illness: [name] is a 82 y.o. female who presents today for a new patient consultation as requested by Dr. Allyne Gee.  Patient has been diagnosed with osteoporosis and has established care to discuss treatment options.  Patient states that she is nave to osteoporosis treatment.  She has not had a bone density in several years.  She denies any upcoming dental work.  She denies any personal history of breast cancer.  She denies any known family history of osteoporosis.  She denies any recurrent falls or history of fractures.  She uses either a cane or walker to assist with ambulation.  She denies any long-term prednisone use.  She has been taking vitamin D 1000 units daily and calcium 600 mg daily. She is under the care of Dr. Regino Schultze for management of chronic lower back pain and right sided sciatica.    Activities of Daily Living:  Patient reports morning stiffness for all day. Patient Reports nocturnal pain.  Difficulty dressing/grooming: Denies Difficulty climbing stairs: Reports Difficulty getting out of chair: Reports Difficulty using hands for taps, buttons, cutlery, and/or writing: Denies  Review of Systems  Constitutional:  Positive for fatigue.  HENT:  Negative for mouth sores and mouth dryness.   Eyes:  Positive for dryness.  Respiratory:  Positive for shortness of breath.   Cardiovascular:  Positive for chest pain and palpitations.  Gastrointestinal:  Negative for blood in stool, constipation and diarrhea.  Endocrine: Negative for increased urination.  Genitourinary:  Negative for involuntary urination.  Musculoskeletal:  Positive for joint pain, gait problem, joint pain,  myalgias, morning stiffness, muscle tenderness and myalgias. Negative for joint swelling and muscle weakness.  Skin:  Positive for hair loss and sensitivity to sunlight. Negative for color change and rash.  Allergic/Immunologic: Negative for susceptible to infections.  Neurological:  Negative for dizziness and headaches.  Hematological:  Negative for swollen glands.  Psychiatric/Behavioral:  Positive for sleep disturbance. Negative for depressed mood. The patient is not nervous/anxious.     PMFS History:  Patient Active Problem List   Diagnosis Date Noted   Change in bowel habit 04/21/2022   Diverticulosis of colon 04/21/2022   Dysphagia 04/21/2022   Family history of malignant neoplasm of digestive organs 04/21/2022   Class 3 severe obesity due to excess calories with serious comorbidity and body mass index (BMI) of 45.0 to 49.9 in adult [facility]) 04/21/2022   Myositis 04/21/2022   Periumbilical pain 04/21/2022   Personal history of colonic polyps 04/21/2022   Bilateral carotid bruits 08/10/2021   Stage 3b chronic kidney disease (HCC) 06/05/2021   Grief 06/05/2021   Prediabetes 06/05/2021   Spinal stenosis of lumbosacral region 06/05/2021   Coronary artery disease of native artery of native heart with stable angina pectoris (HCC) 06/07/2018   Anxiety 01/18/2018   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   Chronic renal disease, 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   Other abnormal glucose 01/15/2018   Neuralgia, 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   NSTEMI (non-ST elevated myocardial infarction) (HCC) 01/14/2018   Essential hypertension 01/14/2018   Hyperlipidemia 01/14/2018   Carotid stenosis, asymptomatic, bilateral  09/06/2012    Past Medical History:  Diagnosis Date   Anxiety    takes Xanax bid prn   Arthritis    Bruises easily    pt is on Plavix   Chronic back pain    buldging disc   Chronic neck pain    Coronary artery disease    1 stent    Dysphagia    Headache(784.0)    related to cervical issues    History of colonic polyps    Hyperlipidemia    takes Crestor daily   Hypertension    takes Coreg and Diovan daily   Myocardial infarction (HCC) 2019   Osteoporosis    was taking shot 2xyr;but not taking anymore   Peripheral vascular disease (HCC)    Seasonal allergies    takes Zyrtec nightly    Family History  Problem Relation Age of Onset   Heart disease Mother        Heart Dissease before age 66   Heart attack Mother    Cancer Father    Leukemia Sister    Cancer Sister    Lupus Sister    Cancer Brother    Heart disease Brother        Amputation   Heart attack Brother    Heart disease Brother    Heart attack Brother    Dementia Other    Healthy Son    Healthy Daughter    Healthy Daughter    Healthy Daughter    Anesthesia problems Neg Hx    Hypotension Neg Hx    Malignant hyperthermia Neg Hx    Pseudochol deficiency Neg Hx    Past Surgical History:  Procedure Laterality Date   ABDOMINAL HYSTERECTOMY  70's   BUNIONECTOMY     left foot   CARDIAC CATHETERIZATION  2009/2011   1 stent placed   CATARACT EXTRACTION Bilateral    CHOLECYSTECTOMY  2009   COLONOSCOPY     ESOPHAGOGASTRODUODENOSCOPY     LEFT HEART CATH AND CORONARY ANGIOGRAPHY N/A 01/16/2018   Procedure: LEFT HEART CATH AND CORONARY ANGIOGRAPHY;  Surgeon: Elder Negus, MD;  Location: [facility];  Service: Cardiovascular;  Laterality: N/A;   TONSILLECTOMY     at age 82   Social History   Social History Narrative   Not on file   Immunization History  Administered Date(s) Administered   Covid-19, Mrna,Vaccine(Spikevax)22yrs and older 12/03/2021   DTaP 10/25/2012   Fluad Quad(high Dose 65+) 12/03/2019, 11/19/2020, 03/23/2022   Influenza, High Dose Seasonal PF 12/21/2017, 11/15/2018   Moderna Covid-19 Vaccine Bivalent Booster 69yrs & up 11/27/2020   PFIZER(Purple Top)SARS-COV-2 Vaccination 03/01/2019, 03/22/2019, 11/13/2019, 06/09/2020   Pneumococcal Conjugate-13 07/15/2013    Pneumococcal Polysaccharide-23 03/12/2012   Zoster Recombinat (Shingrix) 11/01/2018, 02/06/2019     Objective: Vital Signs: BP (!) 173/65 (BP Location: Right Wrist, Patient Position: Sitting, Cuff Size: Normal)   Pulse 72   Resp 14   Ht 5\' 3"  (1.6 m)   Wt 224 lb 9.6 oz (101.9 kg)   BMI 39.79 kg/m    Physical Exam Vitals and nursing note reviewed.  Constitutional:      Appearance: She is well-developed.  HENT:     Head: Normocephalic and atraumatic.  Eyes:     Conjunctiva/sclera: Conjunctivae normal.  Cardiovascular:     Rate and Rhythm: Normal rate and regular rhythm.     Heart sounds: Normal heart sounds.  Pulmonary:     Effort: Pulmonary effort is normal.  Breath sounds: Normal breath sounds.  Abdominal:     General: Bowel sounds are normal.     Palpations: Abdomen is soft.  Musculoskeletal:     Cervical back: Normal range of motion.  Lymphadenopathy:     Cervical: No cervical adenopathy.  Skin:    General: Skin is warm and dry.     Capillary Refill: Capillary refill takes less than 2 seconds.  Neurological:     Mental Status: She is alert and oriented to person, place, and time.  Psychiatric:        Behavior: Behavior normal.      Musculoskeletal Exam: C-spine has good ROM.  Discomfort with lumbar ROM.  Shoulder joints have slightly limited range of motion with discomfort bilaterally.  Elbow joints, wrist joints, MCPs, PIPs, DIPs have good range of motion with no synovitis.  Complete fist formation bilaterally.  Hip joints have good range of motion with no groin pain.  Knee joints have good range of motion with no warmth or effusion.  Ankle joints have good range of motion with no joint tenderness.  CDAI Exam: CDAI Score: -- Patient Global: --; Provider Global: -- Swollen: --; Tender: -- Joint Exam 06/29/2022   No joint exam has been documented for this visit   There is currently no information documented on the homunculus. Go to the Rheumatology activity  and complete the homunculus joint exam.  Investigation: No additional findings.  Imaging: No results found.  Recent Labs: Lab Results  Component Value Date   WBC 9.4 03/23/2022   HGB 11.9 03/23/2022   PLT 445 03/23/2022   NA 145 (H) 04/19/2022   K 4.2 04/19/2022   CL 107 (H) 04/19/2022   CO2 23 04/19/2022   GLUCOSE 102 (H) 04/19/2022   BUN 11 04/19/2022   CREATININE 1.03 (H) 04/19/2022   BILITOT 0.3 03/23/2022   ALKPHOS 164 (H) 03/23/2022   AST 11 03/23/2022   ALT 6 03/23/2022   PROT 6.7 03/23/2022   ALBUMIN 4.4 03/23/2022   CALCIUM 9.0 04/19/2022   GFRAA 46 (L) 12/30/2019    Speciality Comments: No specialty comments available.  Procedures:  No procedures performed Allergies: Codeine    Assessment / Plan:     Visit Diagnoses: Localized osteoporosis without current pathological fracture - DEXA 05/20/22: The BMD measured at Femur Neck Right is 0.639 g/cm2 with a T-score of -2.9.  Reviewed DEXA results with the patient today in detail.  Discussed the diagnosis of osteoporosis.  Patient is nave to osteoporosis treatment. She has not had any recurrent falls.  No history of fractures.  No known family history of osteoporosis. She has been taking calcium 600 mg daily and vitamin D 1000 units daily.   Different treatment options were discussed today in detail.  Plan to avoid the use of bisphosphonates given history of GERD and chronic kidney disease.  Plan to avoid the use of Evenity given cardiovascular history including MI and coronary artery disease.  Patient has declined the use of Forteo/Tymlos due to being hesitant to proceed with a daily injections.  Indications, contraindications, and potential side effects of Prolia were discussed today in detail.  All questions were addressed.  Plan to apply for Prolia through her insurance and once approved the dosing will be 60 mg subcutaneous injections every 6 months.  She has no upcoming dental work scheduled.  Plan to update CBC,  CMP, and vitamin D level today prior to initiating Prolia.  She will follow up in 6 weeks or sooner  if needed.  - Plan: COMPLETE METABOLIC PANEL WITH GFR  Medication monitoring encounter -Plan to apply for Prolia 60 mg sq injections every 6 months.   Lab work from 09/13/2021 was reviewed today in the office: SPEP: No monoclonal proteins detected.  PTH within normal limits, and phosphorus within normal limits. CBC, CMP, and vitamin D will be checked today. CBC and CMP updated today along with vitamin D. Plan: COMPLETE METABOLIC PANEL WITH GFR, CBC with Differential/Platelet, VITAMIN D 25 Hydroxy (Vit-D Deficiency, Fractures)  Vitamin D deficiency -She has been taking vitamin D 1000 units daily.  Vitamin D level be rechecked today.  Plan: VITAMIN D 25 Hydroxy (Vit-D Deficiency, Fractures)  History of gastroesophageal reflux (GERD): Plan to avoid oral bisphosphonates.  Stage 3b chronic kidney disease [facility]): Plan to avoid bisphosphonates.    Spinal stenosis of lumbosacral region: Under the care of Dr. Regino Schultze.    Other medical conditions are listed as follows:   Carotid stenosis, asymptomatic, bilateral  Coronary artery disease of native artery of native heart with stable angina pectoris (HCC)  Essential hypertension: BP was elevated today in the office and was rechecked prior to leaving.  NSTEMI (non-ST elevated myocardial infarction) (HCC)  Diverticulosis of colon  Neuralgia, postherpetic  Hypertensive nephropathy  Bilateral carotid bruits  Class 3 severe obesity due to excess calories with serious comorbidity and body mass index (BMI) of 45.0 to 49.9 in adult [facility])  Pure hypercholesterolemia  Personal history of colonic polyps  Prediabetes    Orders: Orders Placed This Encounter  Procedures   COMPLETE METABOLIC PANEL WITH GFR   CBC with Differential/Platelet   VITAMIN D 25 Hydroxy (Vit-D Deficiency, Fractures)   No orders of the defined types were placed in this  encounter.   Follow-Up Instructions: Return in about 6 weeks (around 08/10/2022) for Osteoporosis.   Gearldine Bienenstock, PA-C  Note - This record has been created using Dragon software.  Chart creation errors have been sought, but may not always  have been located. Such creation errors do not reflect on  the standard of medical care.

## 2022-06-20 ENCOUNTER — Other Ambulatory Visit: Payer: Self-pay | Admitting: Internal Medicine

## 2022-06-20 ENCOUNTER — Ambulatory Visit: Payer: Self-pay

## 2022-06-20 DIAGNOSIS — F419 Anxiety disorder, unspecified: Secondary | ICD-10-CM

## 2022-06-20 NOTE — Patient Instructions (Addendum)
Visit Information  Thank you for taking time to visit with me today. Please don't hesitate to contact me if I can be of assistance to you.   Following are the goals we discussed today:   Goals Addressed             This Visit's Progress    To have arthritis pain and back pain with Sciatica evaluated by orthopedics       Care Coordination Interventions: Reviewed provider established plan for pain management Reviewed and discussed with patient PCP referrals to Pain Management and Rheumatology for further evaluation of patient's c/o chronic muscle and joint pain  Determined patient completed a f/u with Dr. Regino Schultze, pain management Reviewed medications with patient and discussed importance of medication adherence Determined patient experienced severe nausea the morning after taking Hydrocodone Educated patient about the indication, usage and frequency of this medication, educated patient regarding ways to help avoid GI upset by taking this medication with a meal  Instructed patient to notify her pain management doctor if she is unable to tolerate this medication and or if this medication is ineffective for her pain Reviewed upcoming scheduled appointment with Sherron Ales PA with [facility] scheduled for 06/29/22 @10 :40 AM Educated patient regarding auto-immune arthritis verses Osteoarthritis and what to expect with her initial Rheumatology check up Provided patient with resources to assist with DME rental of a scooter while traveling with her family on vacation          Our next appointment is by telephone on 07/18/22 at 11:30 AM  Please call the care guide team at [phone] if you need to cancel or reschedule your appointment.   If you are experiencing a Mental Health or Behavioral Health Crisis or need someone to talk to, please call 1-800-273-TALK (toll free, 24 hour hotline) go to [facility] [address] [place]  [phone])  The patient verbalized understanding of instructions, educational materials, and care plan provided today and DECLINED offer to receive copy of patient instructions, educational materials, and care plan.   Delsa Sale, RN, BSN, CCM Care Management Coordinator [facility] Care Management Direct Phone: [phone]

## 2022-06-20 NOTE — Patient Outreach (Signed)
  Care Coordination   Follow Up Visit Note   06/20/2022 Name: KELECHI TRULY MRN: [id number] DOB: October 20, 1940  [name] is a 82 y.o. year old female who sees [name], MD for primary care. I spoke with  [name] by phone today.  What matters to the patients health and wellness today?  Patient experienced severe nausea following her initial dose of Hydrocodone.     Goals Addressed             This Visit's Progress    To have arthritis pain and back pain with Sciatica evaluated by orthopedics       Care Coordination Interventions: Reviewed provider established plan for pain management Reviewed and discussed with patient PCP referrals to Pain Management and Rheumatology for further evaluation of patient's c/o chronic muscle and joint pain  Determined patient completed a f/u with Dr. Regino Schultze, pain management Reviewed medications with patient and discussed importance of medication adherence Determined patient experienced severe nausea the morning after taking Hydrocodone Educated patient about the indication, usage and frequency of this medication, educated patient regarding ways to help avoid GI upset by taking this medication with a meal  Instructed patient to notify her pain management doctor if she is unable to tolerate this medication and or if this medication is ineffective for her pain Reviewed upcoming scheduled appointment with Sherron Ales PA with [facility] scheduled for 06/29/22 @10 :40 AM Educated patient regarding auto-immune arthritis verses Osteoarthritis and what to expect with her initial Rheumatology check up Provided patient with resources to assist with DME rental of a scooter while traveling with her family on vacation      Interventions Today    Flowsheet Row Most Recent Value  Chronic Disease   Chronic disease during today's visit Other  [chronic joint and muscle pain]  General Interventions   General Interventions  Discussed/Reviewed General Interventions Discussed, General Interventions Reviewed, Doctor Visits, Labs  Doctor Visits Discussed/Reviewed Doctor Visits Discussed, Doctor Visits Reviewed, PCP, Specialist  Exercise Interventions   Exercise Discussed/Reviewed Physical Activity  Physical Activity Discussed/Reviewed Physical Activity Discussed, Physical Activity Reviewed  Education Interventions   Education Provided Provided Education  Provided Verbal Education On Labs, Medication, When to see the doctor  Nutrition Interventions   Nutrition Discussed/Reviewed Decreasing salt  Pharmacy Interventions   Pharmacy Dicussed/Reviewed Pharmacy Topics Discussed, Pharmacy Topics Reviewed, Medications and their functions  Margot Chimes SE with narcotic use]  Safety Interventions   Safety Discussed/Reviewed Home Safety, Fall Risk  Home Engineer, water, Refer for community resources  [provided DME rental info for scooter rental while traveling with family]          SDOH assessments and interventions completed:  No     Care Coordination Interventions:  Yes, provided   Follow up plan: Follow up call scheduled for 07/18/22 @11 :30 AM    Encounter Outcome:  Pt. Visit Completed

## 2022-06-28 DIAGNOSIS — M48062 Spinal stenosis, lumbar region with neurogenic claudication: Secondary | ICD-10-CM | POA: Diagnosis not present

## 2022-06-28 DIAGNOSIS — M533 Sacrococcygeal disorders, not elsewhere classified: Secondary | ICD-10-CM | POA: Diagnosis not present

## 2022-06-29 ENCOUNTER — Encounter: Payer: Self-pay | Admitting: Physician Assistant

## 2022-06-29 ENCOUNTER — Telehealth: Payer: Self-pay | Admitting: Pharmacist

## 2022-06-29 ENCOUNTER — Ambulatory Visit: Payer: Medicare Other | Attending: Physician Assistant | Admitting: Physician Assistant

## 2022-06-29 ENCOUNTER — Other Ambulatory Visit (HOSPITAL_COMMUNITY): Payer: Self-pay

## 2022-06-29 VITALS — BP 173/65 | HR 72 | Resp 14 | Ht 63.0 in | Wt 224.6 lb

## 2022-06-29 DIAGNOSIS — I214 Non-ST elevation (NSTEMI) myocardial infarction: Secondary | ICD-10-CM

## 2022-06-29 DIAGNOSIS — R7303 Prediabetes: Secondary | ICD-10-CM

## 2022-06-29 DIAGNOSIS — E559 Vitamin D deficiency, unspecified: Secondary | ICD-10-CM

## 2022-06-29 DIAGNOSIS — Z86010 Personal history of colonic polyps: Secondary | ICD-10-CM

## 2022-06-29 DIAGNOSIS — K5730 Diverticulosis of large intestine without perforation or abscess without bleeding: Secondary | ICD-10-CM | POA: Diagnosis not present

## 2022-06-29 DIAGNOSIS — I129 Hypertensive chronic kidney disease with stage 1 through stage 4 chronic kidney disease, or unspecified chronic kidney disease: Secondary | ICD-10-CM | POA: Diagnosis not present

## 2022-06-29 DIAGNOSIS — Z5181 Encounter for therapeutic drug level monitoring: Secondary | ICD-10-CM

## 2022-06-29 DIAGNOSIS — I25118 Atherosclerotic heart disease of native coronary artery with other forms of angina pectoris: Secondary | ICD-10-CM | POA: Diagnosis not present

## 2022-06-29 DIAGNOSIS — M816 Localized osteoporosis [Lequesne]: Secondary | ICD-10-CM

## 2022-06-29 DIAGNOSIS — M4807 Spinal stenosis, lumbosacral region: Secondary | ICD-10-CM | POA: Diagnosis not present

## 2022-06-29 DIAGNOSIS — Z8719 Personal history of other diseases of the digestive system: Secondary | ICD-10-CM

## 2022-06-29 DIAGNOSIS — I10 Essential (primary) hypertension: Secondary | ICD-10-CM

## 2022-06-29 DIAGNOSIS — I6523 Occlusion and stenosis of bilateral carotid arteries: Secondary | ICD-10-CM

## 2022-06-29 DIAGNOSIS — R0989 Other specified symptoms and signs involving the circulatory and respiratory systems: Secondary | ICD-10-CM | POA: Diagnosis not present

## 2022-06-29 DIAGNOSIS — N1832 Chronic kidney disease, stage 3b: Secondary | ICD-10-CM

## 2022-06-29 DIAGNOSIS — B0229 Other postherpetic nervous system involvement: Secondary | ICD-10-CM | POA: Diagnosis not present

## 2022-06-29 DIAGNOSIS — Z79899 Other long term (current) drug therapy: Secondary | ICD-10-CM

## 2022-06-29 DIAGNOSIS — Z6841 Body Mass Index (BMI) 40.0 and over, adult: Secondary | ICD-10-CM

## 2022-06-29 DIAGNOSIS — E7800 Pure hypercholesterolemia, unspecified: Secondary | ICD-10-CM

## 2022-06-29 LAB — CBC WITH DIFFERENTIAL/PLATELET
Basophils Relative: 0.5 %
Eosinophils Relative: 2.6 %
Lymphs Abs: 2102 cells/uL (ref 850–3900)
MCH: 22.8 pg — ABNORMAL LOW (ref 27.0–33.0)
MCHC: 30 g/dL — ABNORMAL LOW (ref 32.0–36.0)
MPV: 11 fL (ref 7.5–12.5)
Monocytes Relative: 10 %

## 2022-06-29 NOTE — Telephone Encounter (Addendum)
Please start Prolia BIV through medical benefit and pharmacy benefit  Prolia - [id number], [phone]  Dose: 60mg  SQ every 6 months  Dx: Age-related osteoporosis (M80.0)  She is naive to osteoporosis treatment  Therapies patient unable to try: Bisphosphonate - history of GERD and CKD Evenity - history of NSTEMI and CAD  Chesley Mires, PharmD, MPH, BCPS, CPP Clinical Pharmacist (Rheumatology and Pulmonology)

## 2022-06-29 NOTE — Progress Notes (Signed)
Pharmacy Note  Subjective:  Patient presents today to [facility] for follow up office visit.   Patient was seen by the pharmacist for counseling on Prolia. She is naive to osteoporosis treatment  Has history of GERD and CKD  Objective: CMP     Component Value Date/Time   NA 145 (H) 04/19/2022 1218   K 4.2 04/19/2022 1218   CL 107 (H) 04/19/2022 1218   CO2 23 04/19/2022 1218   GLUCOSE 102 (H) 04/19/2022 1218   GLUCOSE 109 (H) 01/16/2018 0409   BUN 11 04/19/2022 1218   CREATININE 1.03 (H) 04/19/2022 1218   CALCIUM 9.0 04/19/2022 1218   PROT 6.7 03/23/2022 1220   ALBUMIN 4.4 03/23/2022 1220   AST 11 03/23/2022 1220   ALT 6 03/23/2022 1220   ALKPHOS 164 (H) 03/23/2022 1220   BILITOT 0.3 03/23/2022 1220   GFRNONAA 40 (L) 12/30/2019 1235   GFRAA 46 (L) 12/30/2019 1235    Vitamin D No results found for: "VD25OH"  DEXA 05/20/22: The BMD measured at Femur Neck Right is 0.639 g/cm2 with a T-score of -2.9   Assessment/Plan:  Counseled patient on purpose, proper use, and adverse effects of Prolia.  Counseled patient that Prolia is a medication that must be injected every 6 months by a healthcare professional.  Advised patient to take calcium 1200 mg daily and vitamin D 800 units daily.  Reviewed the most common adverse effects of Prolia including risk of infection, osteonecrosis of the jaw, rash, and muscle/bone pain.  Patient confirms she does not have any major dental work planned at this time. Next f/u with dentist is in August 2024. Reviewed with patient the signs/symptoms of low calcium and advised patient to alert [place] if she experiences these symptoms.  Provided patient with medication education material and answered all questions. Will apply for Prolia through patient's insurance.   Chesley Mires, PharmD, MPH, BCPS, CPP Clinical Pharmacist (Rheumatology and Pulmonology)

## 2022-06-29 NOTE — Telephone Encounter (Addendum)
Test claim revealed that insurance covers $1,446.07, leaving patient with a copay of $300. No authorization is required through Pharmacy Benefits at this time.  Contacted [facility] to investigate [phone] through patient's medical benefits. Per rep, provider is In-Network and a Pre-Authorization will be required for this J-Code through [employer]. The patient will be responsible for a 20% coinsurance charge until any Deductible and/or Maximum Out of Pocket requirements have been met for the current calendar year. Pt also appears to be subject to a $20 copayment for Specialist visits.  At this time the patient has no deductible requirement and has met $329.51 out of their $3,600 Maximum Out of Pocket requirement.  Call Ref#: [id number] Phone#: [phone]

## 2022-06-30 LAB — COMPLETE METABOLIC PANEL WITH GFR
AG Ratio: 1.6 (calc) (ref 1.0–2.5)
ALT: 6 U/L (ref 6–29)
AST: 12 U/L (ref 10–35)
Albumin: 4.1 g/dL (ref 3.6–5.1)
Alkaline phosphatase (APISO): 137 U/L (ref 37–153)
BUN/Creatinine Ratio: 12 (calc) (ref 6–22)
BUN: 13 mg/dL (ref 7–25)
CO2: 29 mmol/L (ref 20–32)
Calcium: 9.6 mg/dL (ref 8.6–10.4)
Chloride: 108 mmol/L (ref 98–110)
Creat: 1.06 mg/dL — ABNORMAL HIGH (ref 0.60–0.95)
Globulin: 2.6 g/dL (calc) (ref 1.9–3.7)
Glucose, Bld: 98 mg/dL (ref 65–99)
Potassium: 4.9 mmol/L (ref 3.5–5.3)
Sodium: 145 mmol/L (ref 135–146)
Total Bilirubin: 0.4 mg/dL (ref 0.2–1.2)
Total Protein: 6.7 g/dL (ref 6.1–8.1)
eGFR: 52 mL/min/{1.73_m2} — ABNORMAL LOW (ref >=60)

## 2022-06-30 LAB — CBC WITH DIFFERENTIAL/PLATELET
Absolute Monocytes: 910 cells/uL (ref 200–950)
Basophils Absolute: 46 cells/uL (ref 0–200)
Eosinophils Absolute: 237 cells/uL (ref 15–500)
HCT: 39 % (ref 35.0–45.0)
Hemoglobin: 11.7 g/dL (ref 11.7–15.5)
MCV: 76 fL — ABNORMAL LOW (ref 80.0–100.0)
Neutro Abs: 5806 cells/uL (ref 1500–7800)
Neutrophils Relative %: 63.8 %
Platelets: 449 10*3/uL — ABNORMAL HIGH (ref 140–400)
RBC: 5.13 10*6/uL — ABNORMAL HIGH (ref 3.80–5.10)
RDW: 15.4 % — ABNORMAL HIGH (ref 11.0–15.0)
Total Lymphocyte: 23.1 %
WBC: 9.1 10*3/uL (ref 3.8–10.8)

## 2022-06-30 LAB — VITAMIN D 25 HYDROXY (VIT D DEFICIENCY, FRACTURES): Vit D, 25-Hydroxy: 42 ng/mL (ref 30–100)

## 2022-06-30 NOTE — Progress Notes (Signed)
Creatinine is slightly elevated-1.06 and GFR is low 52.  Rest of CMP WNL Calcium WNL Platelet count is slightly elevated. Rest of CBC stable.  Vitamin D WNL-continue maintenance dose of vitamin D.

## 2022-07-01 ENCOUNTER — Other Ambulatory Visit: Payer: Self-pay | Admitting: Internal Medicine

## 2022-07-01 DIAGNOSIS — M48061 Spinal stenosis, lumbar region without neurogenic claudication: Secondary | ICD-10-CM

## 2022-07-05 NOTE — Telephone Encounter (Signed)
I spoke with patient regarding Prolia benefits through pharmacy and medical benefits. She would like more time to think about this. She requested call back on Thursday   Chesley Mires, PharmD, MPH, BCPS, CPP Clinical Pharmacist (Rheumatology and Pulmonology)

## 2022-07-14 ENCOUNTER — Other Ambulatory Visit: Payer: Self-pay | Admitting: Internal Medicine

## 2022-07-14 DIAGNOSIS — M48061 Spinal stenosis, lumbar region without neurogenic claudication: Secondary | ICD-10-CM

## 2022-07-14 MED ORDER — PREGABALIN 75 MG PO CAPS
ORAL_CAPSULE | ORAL | 1 refills | Status: DC
Start: 2022-07-14 — End: 2022-10-19

## 2022-07-18 ENCOUNTER — Ambulatory Visit: Payer: Self-pay

## 2022-07-18 NOTE — Patient Outreach (Signed)
  Care Coordination   07/18/2022 Name: Kathryn [name] MRN: [id number] DOB: 1940-05-05   Care Coordination Outreach Attempts:  An unsuccessful telephone outreach was attempted for a scheduled appointment today.  Follow Up Plan:  Additional outreach attempts will be made to offer the patient care coordination information and services.   Encounter Outcome:  No Answer   Care Coordination Interventions:  No, not indicated    Delsa Sale, RN, BSN, CCM Care Management Coordinator [facility] Care Management  Direct Phone: [phone]

## 2022-07-19 DIAGNOSIS — H04123 Dry eye syndrome of bilateral lacrimal glands: Secondary | ICD-10-CM | POA: Diagnosis not present

## 2022-07-19 DIAGNOSIS — H16223 Keratoconjunctivitis sicca, not specified as Sjogren's, bilateral: Secondary | ICD-10-CM | POA: Diagnosis not present

## 2022-07-20 DIAGNOSIS — M533 Sacrococcygeal disorders, not elsewhere classified: Secondary | ICD-10-CM | POA: Diagnosis not present

## 2022-07-20 NOTE — Telephone Encounter (Signed)
ATC patient about Prolia new start, left voicemail to call back.

## 2022-07-22 ENCOUNTER — Telehealth: Payer: Self-pay | Admitting: Rheumatology

## 2022-07-22 NOTE — Telephone Encounter (Unsigned)
Patient left a voicemail stating she was returning Devki's call regarding the Prolia injection.

## 2022-07-25 ENCOUNTER — Other Ambulatory Visit (HOSPITAL_COMMUNITY): Payer: Self-pay

## 2022-07-25 ENCOUNTER — Other Ambulatory Visit: Payer: Self-pay

## 2022-07-25 MED ORDER — DENOSUMAB 60 MG/ML ~~LOC~~ SOSY
60.0000 mg | PREFILLED_SYRINGE | SUBCUTANEOUS | 0 refills | Status: AC
Start: 2022-07-25 — End: ?
  Filled 2022-07-25 (×2): qty 1, 180d supply, fill #0

## 2022-07-25 NOTE — Telephone Encounter (Signed)
Reached out to patient regarding Prolia. Patient has agreed to start, scheduled appointment for 08/03/22 at 11:00 am. Patient agreed to come to clinic for injection and is aware of paying $300 copay upfront.   Told patient that Mills Koller will reach out for shipment coordination.

## 2022-07-25 NOTE — Telephone Encounter (Signed)
Addressed in previous Prolia BIV encounter  Kathryn [name], PharmD, MPH, BCPS, CPP Clinical Pharmacist (Rheumatology and Pulmonology)

## 2022-07-26 ENCOUNTER — Other Ambulatory Visit (HOSPITAL_COMMUNITY): Payer: Self-pay

## 2022-07-27 ENCOUNTER — Other Ambulatory Visit (HOSPITAL_COMMUNITY): Payer: Self-pay

## 2022-07-28 NOTE — Telephone Encounter (Signed)
Prolia received from [facility]. Placed in fridge 

## 2022-08-03 ENCOUNTER — Ambulatory Visit: Payer: Medicare Other | Admitting: Pharmacist

## 2022-08-08 ENCOUNTER — Other Ambulatory Visit: Payer: Self-pay | Admitting: Internal Medicine

## 2022-08-08 DIAGNOSIS — M533 Sacrococcygeal disorders, not elsewhere classified: Secondary | ICD-10-CM | POA: Diagnosis not present

## 2022-08-12 ENCOUNTER — Ambulatory Visit: Payer: Medicare Other | Admitting: Physician Assistant

## 2022-08-12 NOTE — Progress Notes (Deleted)
Office Visit Note  Patient: Kathryn [name]             Date of Birth: Jun 25, 1940           MRN: [id number]             PCP: [name], MD Referring: [name], MD Visit Date: 08/26/2022 Occupation: @GUAROCC @  Subjective:  No chief complaint on file.   History of Present Illness: Kathryn [name] is a 82 y.o. female ***  DEXA 05/20/22: The BMD measured at Femur Neck Right is 0.639 g/cm2 with a T-score of -2.9.  Reviewed DEXA results with the patient today in detail.  Discussed the diagnosis of osteoporosis.  Patient is nave to osteoporosis treatment. She has not had any recurrent falls.  No history of fractures.  No known family history of osteoporosis. She has been taking calcium 600 mg daily and vitamin D 1000 units daily.   Different treatment options were discussed today in detail.  Plan to avoid the use of bisphosphonates given history of GERD and chronic kidney disease.  Plan to avoid the use of Evenity given cardiovascular history including MI and coronary artery disease.  Patient has declined the use of Forteo/Tymlos due to being hesitant to proceed with a daily injections.  Indications, contraindications, and potential side effects of Prolia were discussed today in detail.  All questions were addressed.  Plan to apply for Prolia through her insurance and once approved the dosing will be 60 mg subcutaneous injections every 6 months.  She has no upcoming dental work scheduled.   Activities of Daily Living:  Patient reports morning stiffness for *** {minute/hour:19697}.   Patient {ACTIONS;DENIES/REPORTS:21021675::"Denies"} nocturnal pain.  Difficulty dressing/grooming: {ACTIONS;DENIES/REPORTS:21021675::"Denies"} Difficulty climbing stairs: {ACTIONS;DENIES/REPORTS:21021675::"Denies"} Difficulty getting out of chair: {ACTIONS;DENIES/REPORTS:21021675::"Denies"} Difficulty using hands for taps, buttons, cutlery, and/or writing:  {ACTIONS;DENIES/REPORTS:21021675::"Denies"}  No Rheumatology ROS completed.   PMFS History:  Patient Active Problem List   Diagnosis Date Noted   Change in bowel habit 04/21/2022   Diverticulosis of colon 04/21/2022   Dysphagia 04/21/2022   Family history of malignant neoplasm of digestive organs 04/21/2022   Class 3 severe obesity due to excess calories with serious comorbidity and body mass index (BMI) of 45.0 to 49.9 in adult [facility]) 04/21/2022   Myositis 04/21/2022   Periumbilical pain 04/21/2022   Personal history of colonic polyps 04/21/2022   Bilateral carotid bruits 08/10/2021   Stage 3b chronic kidney disease (HCC) 06/05/2021   Grief 06/05/2021   Prediabetes 06/05/2021   Spinal stenosis of lumbosacral region 06/05/2021   Coronary artery disease of native artery of native heart with stable angina pectoris (HCC) 06/07/2018   Anxiety 01/18/2018   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   Chronic renal disease, 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   Other abnormal glucose 01/15/2018   Neuralgia, 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   NSTEMI (non-ST elevated myocardial infarction) (HCC) 01/14/2018   Essential hypertension 01/14/2018   Hyperlipidemia 01/14/2018   Carotid stenosis, asymptomatic, bilateral 09/06/2012    Past Medical History:  Diagnosis Date   Anxiety    takes Xanax bid prn   Arthritis    Bruises easily    pt is on Plavix   Chronic back pain    buldging disc   Chronic neck pain    Coronary artery disease    1 stent    Dysphagia    Headache(784.0)    related to cervical issues   History of colonic polyps    Hyperlipidemia    takes Crestor daily   Hypertension  takes Coreg and Diovan daily   Myocardial infarction (HCC) 2019   Osteoporosis    was taking shot 2xyr;but not taking anymore   Peripheral vascular disease (HCC)    Seasonal allergies    takes Zyrtec nightly    Family History  Problem Relation Age of Onset   Heart disease Mother        Heart Dissease before age 18    Heart attack Mother    Cancer Father    Leukemia Sister    Cancer Sister    Lupus Sister    Cancer Brother    Heart disease Brother        Amputation   Heart attack Brother    Heart disease Brother    Heart attack Brother    Dementia Other    Healthy Son    Healthy Daughter    Healthy Daughter    Healthy Daughter    Anesthesia problems Neg Hx    Hypotension Neg Hx    Malignant hyperthermia Neg Hx    Pseudochol deficiency Neg Hx    Past Surgical History:  Procedure Laterality Date   ABDOMINAL HYSTERECTOMY  70's   BUNIONECTOMY     left foot   CARDIAC CATHETERIZATION  2009/2011   1 stent placed   CATARACT EXTRACTION Bilateral    CHOLECYSTECTOMY  2009   COLONOSCOPY     ESOPHAGOGASTRODUODENOSCOPY     LEFT HEART CATH AND CORONARY ANGIOGRAPHY N/A 01/16/2018   Procedure: LEFT HEART CATH AND CORONARY ANGIOGRAPHY;  Surgeon: Elder Negus, MD;  [facility];  Service: Cardiovascular;  Laterality: N/A;   TONSILLECTOMY     at age 82   Social History   Social History Narrative   Not on file   Immunization History  Administered Date(s) Administered   Covid-19, Mrna,Vaccine(Spikevax)63yrs and older 12/03/2021   DTaP 10/25/2012   Fluad Quad(high Dose 65+) 12/03/2019, 11/19/2020, 03/23/2022   Influenza, High Dose Seasonal PF 12/21/2017, 11/15/2018   Moderna Covid-19 Vaccine Bivalent Booster 22yrs & up 11/27/2020   PFIZER(Purple Top)SARS-COV-2 Vaccination 03/01/2019, 03/22/2019, 11/13/2019, 06/09/2020   Pneumococcal Conjugate-13 07/15/2013   Pneumococcal Polysaccharide-23 03/12/2012   Zoster Recombinat (Shingrix) 11/01/2018, 02/06/2019     Objective: Vital Signs: There were no vitals taken for this visit.   Physical Exam   Musculoskeletal Exam: ***  CDAI Exam: CDAI Score: -- Patient Global: --; Provider Global: -- Swollen: --; Tender: -- Joint Exam 08/26/2022   No joint exam has been documented for this visit   There is currently no  information documented on the homunculus. Go to the Rheumatology activity and complete the homunculus joint exam.  Investigation: No additional findings.  Imaging: No results found.  Recent Labs: Lab Results  Component Value Date   WBC 9.1 06/29/2022   HGB 11.7 06/29/2022   PLT 449 (H) 06/29/2022   NA 145 06/29/2022   K 4.9 06/29/2022   CL 108 06/29/2022   CO2 29 06/29/2022   GLUCOSE 98 06/29/2022   BUN 13 06/29/2022   CREATININE 1.06 (H) 06/29/2022   BILITOT 0.4 06/29/2022   ALKPHOS 164 (H) 03/23/2022   AST 12 06/29/2022   ALT 6 06/29/2022   PROT 6.7 06/29/2022   ALBUMIN 4.4 03/23/2022   CALCIUM 9.6 06/29/2022   GFRAA 46 (L) 12/30/2019    Speciality Comments: No specialty comments available.  Procedures:  No procedures performed Allergies: Codeine   Assessment / Plan:     Visit Diagnoses: Localized osteoporosis without current pathological fracture  Medication monitoring encounter  Vitamin D deficiency  Stage 3b chronic kidney disease (HCC)  History of gastroesophageal reflux (GERD)  Spinal stenosis of lumbosacral region  Carotid stenosis, asymptomatic, bilateral  Coronary artery disease of native artery of native heart with stable angina pectoris (HCC)  Essential hypertension  NSTEMI (non-ST elevated myocardial infarction) (HCC)  Diverticulosis of colon  Neuralgia, postherpetic  Hypertensive nephropathy  Bilateral carotid bruits  Class 3 severe obesity due to excess calories with serious comorbidity and body mass index (BMI) of 45.0 to 49.9 in adult [facility])  Pure hypercholesterolemia  Personal history of colonic polyps  Prediabetes  Orders: No orders of the defined types were placed in this encounter.  No orders of the defined types were placed in this encounter.   Face-to-face time spent with patient was *** minutes. Greater than 50% of time was spent in counseling and coordination of care.  Follow-Up Instructions: No follow-ups on  file.   Gearldine Bienenstock, PA-C  Note - This record has been created using Dragon software.  Chart creation errors have been sought, but may not always  have been located. Such creation errors do not reflect on  the standard of medical care.

## 2022-08-21 ENCOUNTER — Ambulatory Visit (HOSPITAL_COMMUNITY)
Admission: EM | Admit: 2022-08-21 | Discharge: 2022-08-21 | Disposition: A | Discharge status: Home / Self Care | Payer: Medicare HMO | Attending: Emergency Medicine | Admitting: Emergency Medicine

## 2022-08-21 ENCOUNTER — Encounter (HOSPITAL_COMMUNITY): Payer: Self-pay | Admitting: Emergency Medicine

## 2022-08-21 DIAGNOSIS — Z87891 Personal history of nicotine dependence: Secondary | ICD-10-CM | POA: Insufficient documentation

## 2022-08-21 DIAGNOSIS — U071 COVID-19: Secondary | ICD-10-CM | POA: Diagnosis not present

## 2022-08-21 DIAGNOSIS — N1832 Chronic kidney disease, stage 3b: Secondary | ICD-10-CM | POA: Diagnosis not present

## 2022-08-21 DIAGNOSIS — J069 Acute upper respiratory infection, unspecified: Secondary | ICD-10-CM | POA: Insufficient documentation

## 2022-08-21 MED ORDER — PREDNISONE 20 MG PO TABS: 40.0000 mg | ORAL_TABLET | Freq: Every day | ORAL | 0 refills | Status: DC

## 2022-08-21 MED ORDER — ALBUTEROL SULFATE HFA 108 (90 BASE) MCG/ACT IN AERS: 2.0000 | INHALATION_SPRAY | RESPIRATORY_TRACT | 0 refills | Status: DC | PRN

## 2022-08-21 NOTE — ED Provider Notes (Signed)
[facility]    CSN: [id number] Arrival date & time: 08/21/22  1718      History   Chief Complaint Chief Complaint  Patient presents with   Headache   Cough    HPI Kathryn [name] is a 82 y.o. female.   Patient presents for evaluation of chills, nasal congestion, rhinorrhea, productive cough, wheezing and intermittent generalized headaches beginning 1 day ago.  Returned from a cruise today, several other traveling members are also sick with similar symptoms.  Has attempted use of Alka-Seltzer.  History of asthma.  Denies shortness of breath, ear pain, sore throat or lower abdominal symptoms.  Tolerating food and liquids but decreased appetite.     Past Medical History:  Diagnosis Date   Anxiety    takes Xanax bid prn   Arthritis    Bruises easily    pt is on Plavix   Chronic back pain    buldging disc   Chronic neck pain    Coronary artery disease    1 stent    Dysphagia    Headache(784.0)    related to cervical issues   History of colonic polyps    Hyperlipidemia    takes Crestor daily   Hypertension    takes Coreg and Diovan daily   Myocardial infarction (HCC) 2019   Osteoporosis    was taking shot 2xyr;but not taking anymore   Peripheral vascular disease (HCC)    Seasonal allergies    takes Zyrtec nightly    Patient Active Problem List   Diagnosis Date Noted   Change in bowel habit 04/21/2022   Diverticulosis of colon 04/21/2022   Dysphagia 04/21/2022   Family history of malignant neoplasm of digestive organs 04/21/2022   Class 3 severe obesity due to excess calories with serious comorbidity and body mass index (BMI) of 45.0 to 49.9 in adult [facility]) 04/21/2022   Myositis 04/21/2022   Periumbilical pain 04/21/2022   Personal history of colonic polyps 04/21/2022   Bilateral carotid bruits 08/10/2021   Stage 3b chronic kidney disease (HCC) 06/05/2021   Grief 06/05/2021   Prediabetes 06/05/2021   Spinal stenosis of lumbosacral region  06/05/2021   Coronary artery disease of native artery of native heart with stable angina pectoris (HCC) 06/07/2018   Anxiety 01/18/2018   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   Chronic renal disease, 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   Other abnormal glucose 01/15/2018   Neuralgia, 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   NSTEMI (non-ST elevated myocardial infarction) (HCC) 01/14/2018   Essential hypertension 01/14/2018   Hyperlipidemia 01/14/2018   Carotid stenosis, asymptomatic, bilateral 09/06/2012    Past Surgical History:  Procedure Laterality Date   ABDOMINAL HYSTERECTOMY  70's   BUNIONECTOMY     left foot   CARDIAC CATHETERIZATION  2009/2011   1 stent placed   CATARACT EXTRACTION Bilateral    CHOLECYSTECTOMY  2009   COLONOSCOPY     ESOPHAGOGASTRODUODENOSCOPY     LEFT HEART CATH AND CORONARY ANGIOGRAPHY N/A 01/16/2018   Procedure: LEFT HEART CATH AND CORONARY ANGIOGRAPHY;  Surgeon: Elder Negus, MD;  [facility];  Service: Cardiovascular;  Laterality: N/A;   TONSILLECTOMY     at age 3    OB History   No obstetric history on file.      Home Medications    Prior to Admission medications   Medication Sig Start Date End Date Taking? Authorizing Provider  acetaminophen (TYLENOL) 325 MG tablet Take 325 mg by mouth every 6 (six) hours as needed  for mild pain.    [provider]  ALPRAZolam Prudy Feeler) 0.25 MG tablet One tab po qpm prn Patient not taking: Reported on 06/09/2022 03/23/22   [name], MD  amLODipine (NORVASC) 10 MG tablet TAKE ONE TABLET BY MOUTH ONCE DAILY. 05/23/22   [name], MD  Apoaequorin (PREVAGEN PO) Take 1 tablet by mouth daily.    [provider]  Aspirin 81 MG CAPS     [provider]  CALCIUM PO Take 1 tablet by mouth daily. 600 mg    [provider]  carvedilol (COREG) 12.5 MG tablet TAKE (1) TABLET BY MOUTH TWICE DAILY. 05/05/22   Custovic, Rozell Searing, DO  cetirizine (ZYRTEC) 10 MG tablet Take 10 mg by mouth at  bedtime.    [provider]  denosumab (PROLIA) 60 MG/ML SOSY injection Inject 60 mg into the skin every 6 (six) months. 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[address], [address], [place] [address].Appt on 08/03/22 07/25/22   Gearldine Bienenstock, PA-C  diclofenac Sodium (VOLTAREN) 1 % GEL Apply 4 g topically 4 (four) times daily. 01/28/21   Asencion Islam, DPM  ezetimibe (ZETIA) 10 MG tablet TAKE 1 TABLET BY MOUTH ONCE A DAY. 04/21/22   Custovic, Rozell Searing, DO  famotidine (PEPCID) 20 MG tablet TAKE ONE TABLET BY MOUTH ONCE DAILY. 08/08/22   [name], MD  fluticasone [facility]) 50 MCG/ACT nasal spray Place 2 sprays into both nostrils daily. Patient not taking: Reported on 06/29/2022 09/28/21   Leath-Warren, Sadie Haber, NP  furosemide (LASIX) 20 MG tablet TAKE 1 TABLET BY MOUTH DAILY Patient not taking: Reported on 06/29/2022 02/09/21   Cantwell, Renne Musca, PA-C  HYDROcodone-acetaminophen (NORCO/VICODIN) 5-325 MG tablet Take 1 tablet by mouth every 6 (six) hours as needed for moderate pain. Patient not taking: Reported on 06/29/2022 06/09/22   [name], MD  losartan (COZAAR) 25 MG tablet Take 1 tablet (25 mg total) by mouth daily. 03/28/22 03/28/23  [name], MD  Multiple Vitamin (MULTIVITAMIN WITH MINERALS) TABS Take 1 tablet by mouth daily.    [provider]  nitroGLYCERIN (NITROSTAT) 0.4 MG SL tablet Place 1 tablet (0.4 mg total) under the tongue every 5 (five) minutes x 3 doses as needed for chest pain. 04/29/22   Yates Decamp, MD  NOREL AD 4-10-325 MG TABS Take 1 tablet by mouth 4 (four) times daily. Patient not taking: Reported on 06/29/2022 04/02/22   [provider]  pravastatin (PRAVACHOL) 40 MG tablet Take 1 tablet (40 mg total) by mouth every evening. 04/15/21 04/19/22  Cantwell, Celeste C, PA-C  pregabalin (LYRICA) 75 MG capsule TAKE 1 CAPSULE BY MOUTH THREE TIMES A DAY. 07/14/22   [name], MD  Probiotic Product (PROBIOTIC DAILY PO) Take 1 tablet by mouth daily at 12 noon.     [provider]  sertraline (ZOLOFT) 25 MG tablet TAKE 1 TABLET BY MOUTH DAILY. 06/20/22   [name], MD  tiZANidine (ZANAFLEX) 4 MG tablet TAKE 1 TABLET BY MOUTH ONCE AT BEDTIME AS NEEDED FOR MUSCLE SPASMS. Patient not taking: Reported on 06/29/2022 06/06/22   [name], MD  triamcinolone cream (KENALOG) 0.1 % Apply 1 Application topically 2 (two) times daily. Patient not taking: Reported on 06/29/2022 08/26/21   Arnette Felts, FNP    Family History Family History  Problem Relation Age of Onset   Heart disease Mother        Heart Dissease before age 82   Heart attack Mother    Cancer Father  Leukemia Sister    Cancer Sister    Lupus Sister    Cancer Brother    Heart disease Brother        Amputation   Heart attack Brother    Heart disease Brother    Heart attack Brother    Dementia Other    Healthy Son    Healthy Daughter    Healthy Daughter    Healthy Daughter    Anesthesia problems Neg Hx    Hypotension Neg Hx    Malignant hyperthermia Neg Hx    Pseudochol deficiency Neg Hx     Social History Social History   Tobacco Use   Smoking status: Former    Packs/day: 0.25    Years: 5.00    Additional pack years: 0.00    Total pack years: 1.25    Types: Cigarettes    Quit date: 11/27/1968    Years since quitting: 53.7    Passive exposure: Never   Smokeless tobacco: Never  Vaping Use   Vaping Use: Never used  Substance Use Topics   Alcohol use: No   Drug use: No     Allergies   Codeine and Ibuprofen   Review of Systems Review of Systems  Constitutional:  Positive for chills. Negative for activity change, appetite change, diaphoresis, fatigue, fever and unexpected weight change.  HENT:  Positive for congestion and rhinorrhea. Negative for dental problem, drooling, ear discharge, ear pain, facial swelling, hearing loss, mouth sores, postnasal drip, sinus pressure, sinus pain, sneezing, sore throat, tinnitus, trouble swallowing and voice change.    Respiratory:  Positive for cough and wheezing. Negative for apnea, choking, chest tightness, shortness of breath and stridor.   Cardiovascular: Negative.   Gastrointestinal: Negative.   Skin: Negative.   Neurological:  Positive for headaches. Negative for dizziness, tremors, seizures, syncope, facial asymmetry, speech difficulty, weakness, light-headedness and numbness.     Physical Exam Triage Vital Signs ED Triage Vitals  Enc Vitals Group     BP 08/21/22 1736 (!) 157/69     Pulse Rate 08/21/22 1736 (!) 107     Resp 08/21/22 1736 20     Temp 08/21/22 1736 (!) 101.8 F (38.8 C)     Temp Source 08/21/22 1736 Oral     SpO2 08/21/22 1736 94 %     Weight --      Height --      Head Circumference --      Peak Flow --      Pain Score 08/21/22 1734 8     Pain Loc --      Pain Edu? --      Excl. in GC? --    No data found.  Updated Vital Signs BP (!) 157/69 (BP Location: Right Arm)   Pulse (!) 107   Temp (!) 101.8 F (38.8 C) (Oral)   Resp 20   SpO2 94%   Visual Acuity Right Eye Distance:   Left Eye Distance:   Bilateral Distance:    Right Eye Near:   Left Eye Near:    Bilateral Near:     Physical Exam Constitutional:      Appearance: Normal appearance.  HENT:     Head: Normocephalic.     Right Ear: Tympanic membrane, ear canal and external ear normal.     Left Ear: Tympanic membrane, ear canal and external ear normal.     Nose: Congestion present. No rhinorrhea.     Mouth/Throat:     Mouth: Mucous  membranes are moist.     Pharynx: No posterior oropharyngeal erythema.  Eyes:     Extraocular Movements: Extraocular movements intact.  Cardiovascular:     Rate and Rhythm: Normal rate and regular rhythm.     Pulses: Normal pulses.     Heart sounds: Normal heart sounds.  Pulmonary:     Effort: Pulmonary effort is normal.     Breath sounds: Normal breath sounds.  Skin:    General: Skin is warm and dry.  Neurological:     General: No focal deficit present.      Mental Status: She is alert and oriented to person, place, and time. Mental status is at baseline.      UC Treatments / Results  Labs (all labs ordered are listed, but only abnormal results are displayed) Labs Reviewed  SARS CORONAVIRUS 2 (TAT 6-24 HRS)    EKG   Radiology No results found.  Procedures Procedures (including critical care time)  Medications Ordered in UC Medications - No data to display  Initial Impression / Assessment and Plan / UC Course  I have reviewed the triage vital signs and the nursing notes.  Pertinent labs & imaging results that were available during my care of the patient were reviewed by me and considered in my medical decision making (see chart for details).  Viral URI with cough  Fever of 101.8 with associated tachycardia noted in triage, while ill-appearing patient is in no signs of distress nor toxic, during visit begins to endorse a discomfort to the center of the chest, EKG completed, shows normal sinus rhythm, lungs are clear to auscultation and O2 saturation 94% on room air, stable for outpatient management, discussed this with patient and daughter, prescribed albuterol inhaler and prednisone, COVID test pending, discussed use of antiviral if positive and current quarantine guidelines per CDC, may continue use of over-the-counter medications for additional supportive care, given strict precautions for any worsening breathing to return for reevaluation, verbalized understanding  Final Clinical Impressions(s) / UC Diagnoses   Final diagnoses:  None   Discharge Instructions   None    ED Prescriptions   None    PDMP not reviewed this encounter.   Valinda Hoar, [place] 08/24/22 [phone]

## 2022-08-21 NOTE — Discharge Instructions (Signed)
Your symptoms today are most likely being caused by a virus and should steadily improve in time it can take up to 7 to 10 days before you truly start to see a turnaround however things will get better  COVID test is pending up to 24 hours, you will be notified of positive test results only, if positive you will receive antiviral medicine which will help to minimize symptoms and timeline that you are sick, does not fully take away illness, current quarantine guidelines are you need to quarantine until 24 hours without fever, once fever has resolved you may return to activity wearing masks if still having symptoms  EKG shows the heart is beating in a regular pace and rhythm  On exam your lungs are clear and you are getting enough air without assistance while viral illness can be flaring your asthma I do not believe that you have pneumonia as symptoms started 1 day ago  Begin prednisone every morning with food for 5 days to open and relax your airway  You may use albuterol inhaler taking 2 puffs every 4-6 hours as needed for wheezing and shortness of breath    Take Tylenol every 6 hours consistently to help keep fevers minimized, this will help keep you comfortable  For cough: honey 1/2 to 1 teaspoon (you can dilute the honey in water or another fluid).  You can also use guaifenesin and dextromethorphan for cough. You can use a humidifier for chest congestion and cough.  If you don't have a humidifier, you can sit in the bathroom with the hot shower running.      For sore throat: try warm salt water gargles, cepacol lozenges, throat spray, warm tea or water with lemon/honey, popsicles or ice, or OTC cold relief medicine for throat discomfort.   For congestion: take a daily anti-histamine like Zyrtec, Claritin, and a oral decongestant, such as pseudoephedrine.  You can also use Flonase 1-2 sprays in each nostril daily.   It is important to stay hydrated: drink plenty of fluids (water,  gatorade/powerade/pedialyte, juices, or teas) to keep your throat moisturized and help further relieve irritation/discomfort.

## 2022-08-21 NOTE — ED Triage Notes (Signed)
Pt c/o cough, congestion, headache, chills since yesterday. Just got back from a cruise. Alka seltzer cold taken for symptoms

## 2022-08-22 ENCOUNTER — Telehealth (INDEPENDENT_AMBULATORY_CARE_PROVIDER_SITE_OTHER): Payer: Medicare HMO | Admitting: Internal Medicine

## 2022-08-22 ENCOUNTER — Telehealth (HOSPITAL_COMMUNITY): Payer: Self-pay | Admitting: Emergency Medicine

## 2022-08-22 ENCOUNTER — Encounter: Payer: Self-pay | Admitting: Internal Medicine

## 2022-08-22 DIAGNOSIS — N1831 Chronic kidney disease, stage 3a: Secondary | ICD-10-CM

## 2022-08-22 DIAGNOSIS — U071 COVID-19: Secondary | ICD-10-CM

## 2022-08-22 LAB — SARS CORONAVIRUS 2 (TAT 6-24 HRS): SARS Coronavirus 2: POSITIVE — AB

## 2022-08-22 MED ORDER — NIRMATRELVIR/RITONAVIR (PAXLOVID) TABLET (RENAL DOSING): 2.0000 | ORAL_TABLET | Freq: Two times a day (BID) | ORAL | 0 refills | Status: AC

## 2022-08-22 NOTE — Patient Instructions (Signed)

## 2022-08-22 NOTE — Progress Notes (Signed)
Virtual Visit via Video   This visit type was conducted due to national recommendations for restrictions regarding the COVID-19 Pandemic (e.g. social distancing) in an effort to limit this patient's exposure and mitigate transmission in our community.  Due to her co-morbid illnesses, this patient is at least at moderate risk for complications without adequate follow up.  This format is felt to be most appropriate for this patient at this time.  All issues noted in this document were discussed and addressed.  A limited physical exam was performed with this format.    This visit type was conducted due to national recommendations for restrictions regarding the COVID-19 Pandemic (e.g. social distancing) in an effort to limit this patient's exposure and mitigate transmission in our community.  Patients identity confirmed using two different identifiers.  This format is felt to be most appropriate for this patient at this time.  All issues noted in this document were discussed and addressed.  No physical exam was performed (except for noted visual exam findings with Video Visits).    Date:  09/05/2022   ID:  Kathryn [name], DOB May 09, 1940, MRN [id number]  Patient Location:  Home, accompanied by her daughter who is facilitating telehealth visit  Provider location:   Office    Chief Complaint:  "I have COVID"  History of Present Illness:    Kathryn [name] is a 82 y.o. female who presents via video conferencing for a telehealth visit today.    The patient does have symptoms concerning for COVID-19 infection (fever, chills, cough, or new shortness of breath).   Patient presents today for treatment for COVID. She presents today for virtual visit. Her daughter is present to help facilitate the call. She prefers this method of contact due to COVID-19 pandemic.  She returned from a 7 day cruise yesterday. Yesterday, she developed chills and a bad headache. She went to [facility] yesterday  for evaluation of her symptoms. They did send off a COVID test, she found out today she has COVID. She states they did not offer treatment.         Past Medical History:  Diagnosis Date   Anxiety    takes Xanax bid prn   Arthritis    Bruises easily    pt is on Plavix   Chronic back pain    buldging disc   Chronic neck pain    Coronary artery disease    1 stent    Dysphagia    Headache(784.0)    related to cervical issues   History of colonic polyps    Hyperlipidemia    takes Crestor daily   Hypertension    takes Coreg and Diovan daily   Myocardial infarction (HCC) 2019   Osteoporosis    was taking shot 2xyr;but not taking anymore   Peripheral vascular disease (HCC)    Seasonal allergies    takes Zyrtec nightly   Past Surgical History:  Procedure Laterality Date   ABDOMINAL HYSTERECTOMY  70's   BUNIONECTOMY     left foot   CARDIAC CATHETERIZATION  2009/2011   1 stent placed   CATARACT EXTRACTION Bilateral    CHOLECYSTECTOMY  2009   COLONOSCOPY     ESOPHAGOGASTRODUODENOSCOPY     LEFT HEART CATH AND CORONARY ANGIOGRAPHY N/A 01/16/2018   Procedure: LEFT HEART CATH AND CORONARY ANGIOGRAPHY;  Surgeon: Elder Negus, MD;  Location: [facility];  Service: Cardiovascular;  Laterality: N/A;   TONSILLECTOMY     at  age 39     Current Meds  Medication Sig   acetaminophen (TYLENOL) 325 MG tablet Take 325 mg by mouth every 6 (six) hours as needed for mild pain.   albuterol (VENTOLIN HFA) 108 (90 Base) MCG/ACT inhaler Inhale 2 puffs into the lungs every 4 (four) hours as needed for wheezing or shortness of breath.   Apoaequorin (PREVAGEN PO) Take 1 tablet by mouth daily.   Aspirin 81 MG CAPS    CALCIUM PO Take 1 tablet by mouth daily. 600 mg   cetirizine (ZYRTEC) 10 MG tablet Take 10 mg by mouth at bedtime.   denosumab (PROLIA) 60 MG/ML SOSY injection Inject 60 mg into the skin every 6 (six) months. 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[address], [place]  [address].Appt on 08/03/22   diclofenac Sodium (VOLTAREN) 1 % GEL Apply 4 g topically 4 (four) times daily.   ezetimibe (ZETIA) 10 MG tablet TAKE 1 TABLET BY MOUTH ONCE A DAY.   famotidine (PEPCID) 20 MG tablet TAKE ONE TABLET BY MOUTH ONCE DAILY.   fluticasone (FLONASE) 50 MCG/ACT nasal spray Place 2 sprays into both nostrils daily.   furosemide (LASIX) 20 MG tablet TAKE 1 TABLET BY MOUTH DAILY   HYDROcodone-acetaminophen (NORCO/VICODIN) 5-325 MG tablet Take 1 tablet by mouth every 6 (six) hours as needed for moderate pain.   losartan (COZAAR) 25 MG tablet Take 1 tablet (25 mg total) by mouth daily.   Multiple Vitamin (MULTIVITAMIN WITH MINERALS) TABS Take 1 tablet by mouth daily.   [EXPIRED] nirmatrelvir/ritonavir, renal dosing, (PAXLOVID) 10 x 150 MG & 10 x 100MG  TABS Take 2 tablets by mouth 2 (two) times daily for 5 days. Patient GFR is 58. Take nirmatrelvir (150 mg) one tablet twice daily for 5 days and ritonavir (100 mg) one tablet twice daily for 5 days.   nitroGLYCERIN (NITROSTAT) 0.4 MG SL tablet Place 1 tablet (0.4 mg total) under the tongue every 5 (five) minutes x 3 doses as needed for chest pain.   NOREL AD 4-10-325 MG TABS Take 1 tablet by mouth 4 (four) times daily.   predniSONE (DELTASONE) 20 MG tablet Take 2 tablets (40 mg total) by mouth daily.   pregabalin (LYRICA) 75 MG capsule TAKE 1 CAPSULE BY MOUTH THREE TIMES A DAY.   Probiotic Product (PROBIOTIC DAILY PO) Take 1 tablet by mouth daily at 12 noon.   sertraline (ZOLOFT) 25 MG tablet TAKE 1 TABLET BY MOUTH DAILY.   tiZANidine (ZANAFLEX) 4 MG tablet TAKE 1 TABLET BY MOUTH ONCE AT BEDTIME AS NEEDED FOR MUSCLE SPASMS.   triamcinolone cream (KENALOG) 0.1 % Apply 1 Application topically 2 (two) times daily.   [DISCONTINUED] amLODipine (NORVASC) 10 MG tablet TAKE ONE TABLET BY MOUTH ONCE DAILY.   [DISCONTINUED] carvedilol (COREG) 12.5 MG tablet TAKE (1) TABLET BY MOUTH TWICE DAILY.     Allergies:   Codeine and Ibuprofen   Social  History   Tobacco Use   Smoking status: Former    Current packs/day: 0.00    Average packs/day: 0.3 packs/day for 5.0 years (1.3 ttl pk-yrs)    Types: Cigarettes    Start date: 11/28/1963    Quit date: 11/27/1968    Years since quitting: 53.8    Passive exposure: Never   Smokeless tobacco: Never  Vaping Use   Vaping status: Never Used  Substance Use Topics   Alcohol use: No   Drug use: No     Family Hx: The patient's family history includes Cancer in her  brother, father, and sister; Dementia in an other family member; Healthy in her daughter, daughter, daughter, and son; Heart attack in her brother, brother, and mother; Heart disease in her brother, brother, and mother; Leukemia in her sister; Lupus in her sister. There is no history of Anesthesia problems, Hypotension, Malignant hyperthermia, or Pseudochol deficiency.  ROS:   Please see the history of present illness.    Review of Systems  Constitutional:  Positive for chills and malaise/fatigue.  HENT:  Positive for congestion. Negative for nosebleeds.   Respiratory:  Positive for cough.   Cardiovascular: Negative.   Gastrointestinal: Negative.   Musculoskeletal:  Positive for myalgias.  Neurological: Negative.   Endo/Heme/Allergies: Negative.   Psychiatric/Behavioral: Negative.      All other systems reviewed and are negative.   Labs/Other Tests and Data Reviewed:    Recent Labs: 06/29/2022: ALT 6; BUN 13; Creat 1.06; Hemoglobin 11.7; Platelets 449; Potassium 4.9; Sodium 145   Recent Lipid Panel Lab Results  Component Value Date/Time   CHOL 151 11/02/2021 04:24 PM   TRIG 139 11/02/2021 04:24 PM   HDL 58 11/02/2021 04:24 PM   CHOLHDL 3.0 02/13/2020 04:02 PM   LDLCALC 69 11/02/2021 04:24 PM    Wt Readings from Last 3 Encounters:  06/29/22 224 lb 9.6 oz (101.9 kg)  06/09/22 219 lb (99.3 kg)  06/09/22 219 lb 3.2 oz (99.4 kg)     Exam:    Vital Signs:  There were no vitals taken for this visit.  Pt did not  have equipment to check vitals at home.   Physical Exam Vitals and nursing note reviewed.  Constitutional:      Appearance: She is ill-appearing.  HENT:     Head: Normocephalic and atraumatic.  Eyes:     Extraocular Movements: Extraocular movements intact.  Pulmonary:     Effort: Pulmonary effort is normal.  Neurological:     Mental Status: She is alert and oriented to person, place, and time.  Psychiatric:        Mood and Affect: Affect normal.     ASSESSMENT & PLAN:    COVID-19 Assessment & Plan: Advised patient to take Vitamin C, D, Zinc.  Keep yourself hydrated with a lot of water and rest. Take Delsym for cough and Mucinex as needed. Take Tylenol every 4-6 hours as needed for pain/fever/body ache. If you have elevated blood pressure, you can take OTC Coricidin. You can also take OTC Oscillococcinum, a homeopathic remedy,  to help with your symptoms.  Educated patient that if symptoms get worse or if he/she experiences any SOB, chest pain or pain in their legs to seek immediate emergency care. She did want antiviral treatment. I planned to send reduced dose Paxlovid; however, upon review of UC notes, rx for renal dose of paxlovid was sent earlier today. She is encouraged to take full course of medication. Advised to walk around twice daily and drink hot beverages daily.  Call office ASAP if you have any questions. Quarantine for 5 days.    Stage 3a chronic kidney disease (HCC) Assessment & Plan: Due to renal insufficiency, she will require renal dosing of Paxlovid. She is encouraged to stay well hydrated during treatment.        . The patient's tobacco use is:  Social History   Tobacco Use  Smoking Status Former   Current packs/day: 0.00   Average packs/day: 0.3 packs/day for 5.0 years (1.3 ttl pk-yrs)   Types: Cigarettes   Start date: 11/28/1963  Quit date: 11/27/1968   Years since quitting: 53.8   Passive exposure: Never  Smokeless Tobacco Never  . She has been  exposed to passive smoke. The patient's alcohol use is:  Social History   Substance and Sexual Activity  Alcohol Use No    COVID-19 Education: The signs and symptoms of COVID-19 were discussed with the patient and how to seek care for testing (follow up with PCP or arrange E-visit).  The importance of social distancing was discussed today.  Patient Risk:   After full review of this patients clinical status, I feel that they are at least moderate risk at this time.  Time:   Today, I have spent 12 minutes/ seconds with the patient with telehealth technology discussing above diagnoses.     Medication Adjustments/Labs and Tests Ordered: Current medicines are reviewed at length with the patient today.  Concerns regarding medicines are outlined above.   Tests Ordered: No orders of the defined types were placed in this encounter.   Medication Changes: No orders of the defined types were placed in this encounter.   Disposition:  Follow up prn  Signed, [name], MD

## 2022-08-23 ENCOUNTER — Ambulatory Visit: Payer: Medicare Other | Admitting: Pharmacist

## 2022-08-26 ENCOUNTER — Other Ambulatory Visit: Payer: Self-pay | Admitting: Internal Medicine

## 2022-08-26 ENCOUNTER — Ambulatory Visit: Payer: Medicare Other | Admitting: Physician Assistant

## 2022-08-26 DIAGNOSIS — R0989 Other specified symptoms and signs involving the circulatory and respiratory systems: Secondary | ICD-10-CM

## 2022-08-26 DIAGNOSIS — I25118 Atherosclerotic heart disease of native coronary artery with other forms of angina pectoris: Secondary | ICD-10-CM

## 2022-08-26 DIAGNOSIS — Z86010 Personal history of colonic polyps: Secondary | ICD-10-CM

## 2022-08-26 DIAGNOSIS — B0229 Other postherpetic nervous system involvement: Secondary | ICD-10-CM

## 2022-08-26 DIAGNOSIS — I214 Non-ST elevation (NSTEMI) myocardial infarction: Secondary | ICD-10-CM

## 2022-08-26 DIAGNOSIS — I10 Essential (primary) hypertension: Secondary | ICD-10-CM

## 2022-08-26 DIAGNOSIS — Z5181 Encounter for therapeutic drug level monitoring: Secondary | ICD-10-CM

## 2022-08-26 DIAGNOSIS — E7800 Pure hypercholesterolemia, unspecified: Secondary | ICD-10-CM

## 2022-08-26 DIAGNOSIS — I129 Hypertensive chronic kidney disease with stage 1 through stage 4 chronic kidney disease, or unspecified chronic kidney disease: Secondary | ICD-10-CM

## 2022-08-26 DIAGNOSIS — I1310 Hypertensive heart and chronic kidney disease without heart failure, with stage 1 through stage 4 chronic kidney disease, or unspecified chronic kidney disease: Secondary | ICD-10-CM

## 2022-08-26 DIAGNOSIS — K5730 Diverticulosis of large intestine without perforation or abscess without bleeding: Secondary | ICD-10-CM

## 2022-08-26 DIAGNOSIS — M816 Localized osteoporosis [Lequesne]: Secondary | ICD-10-CM

## 2022-08-26 DIAGNOSIS — E559 Vitamin D deficiency, unspecified: Secondary | ICD-10-CM

## 2022-08-26 DIAGNOSIS — I6523 Occlusion and stenosis of bilateral carotid arteries: Secondary | ICD-10-CM

## 2022-08-26 DIAGNOSIS — R7303 Prediabetes: Secondary | ICD-10-CM

## 2022-08-26 DIAGNOSIS — Z8719 Personal history of other diseases of the digestive system: Secondary | ICD-10-CM

## 2022-08-26 DIAGNOSIS — M4807 Spinal stenosis, lumbosacral region: Secondary | ICD-10-CM

## 2022-08-26 DIAGNOSIS — N1832 Chronic kidney disease, stage 3b: Secondary | ICD-10-CM

## 2022-09-05 DIAGNOSIS — U071 COVID-19: Secondary | ICD-10-CM | POA: Insufficient documentation

## 2022-09-05 NOTE — Assessment & Plan Note (Signed)
Advised patient to take Vitamin C, D, Zinc.  Keep yourself hydrated with a lot of water and rest. Take Delsym for cough and Mucinex as needed. Take Tylenol every 4-6 hours as needed for pain/fever/body ache. If you have elevated blood pressure, you can take OTC Coricidin. You can also take OTC Oscillococcinum, a homeopathic remedy,  to help with your symptoms.  Educated patient that if symptoms get worse or if he/she experiences any SOB, chest pain or pain in their legs to seek immediate emergency care. She did want antiviral treatment. I planned to send reduced dose Paxlovid; however, upon review of UC notes, rx for renal dose of paxlovid was sent earlier today. She is encouraged to take full course of medication. Advised to walk around twice daily and drink hot beverages daily.  Call office ASAP if you have any questions. Quarantine for 5 days.

## 2022-09-05 NOTE — Assessment & Plan Note (Signed)
Due to renal insufficiency, she will require renal dosing of Paxlovid. She is encouraged to stay well hydrated during treatment.

## 2022-09-07 NOTE — Progress Notes (Deleted)
Office Visit Note  Patient: Kathryn [name]             Date of Birth: 12/09/1940           MRN: [id number]             PCP: [name], MD Referring: [name], MD Visit Date: 09/08/2022 Occupation: @GUAROCC @  Subjective:    History of Present Illness: Kathryn [name] is a 82 y.o. female with history of osteoporosis.    Prolia?     Activities of Daily Living:  Patient reports morning stiffness for *** {minute/hour:19697}.   Patient {ACTIONS;DENIES/REPORTS:21021675::"Denies"} nocturnal pain.  Difficulty dressing/grooming: {ACTIONS;DENIES/REPORTS:21021675::"Denies"} Difficulty climbing stairs: {ACTIONS;DENIES/REPORTS:21021675::"Denies"} Difficulty getting out of chair: {ACTIONS;DENIES/REPORTS:21021675::"Denies"} Difficulty using hands for taps, buttons, cutlery, and/or writing: {ACTIONS;DENIES/REPORTS:21021675::"Denies"}  No Rheumatology ROS completed.   PMFS History:  Patient Active Problem List   Diagnosis Date Noted   COVID-19 09/05/2022   Change in bowel habit 04/21/2022   Diverticulosis of colon 04/21/2022   Dysphagia 04/21/2022   Family history of malignant neoplasm of digestive organs 04/21/2022   Class 3 severe obesity due to excess calories with serious comorbidity and body mass index (BMI) of 45.0 to 49.9 in adult [facility]) 04/21/2022   Myositis 04/21/2022   Periumbilical pain 04/21/2022   Personal history of colonic polyps 04/21/2022   Bilateral carotid bruits 08/10/2021   Stage 3a chronic kidney disease (HCC) 06/05/2021   Grief 06/05/2021   Prediabetes 06/05/2021   Spinal stenosis of lumbosacral region 06/05/2021   Coronary artery disease of native artery of native heart with stable angina pectoris (HCC) 06/07/2018   Anxiety 01/18/2018   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   Chronic renal disease, 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   Other abnormal glucose 01/15/2018   Neuralgia, 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   NSTEMI (non-ST elevated myocardial infarction)  (HCC) 01/14/2018   Essential hypertension 01/14/2018   Hyperlipidemia 01/14/2018   Carotid stenosis, asymptomatic, bilateral 09/06/2012    Past Medical History:  Diagnosis Date   Anxiety    takes Xanax bid prn   Arthritis    Bruises easily    pt is on Plavix   Chronic back pain    buldging disc   Chronic neck pain    Coronary artery disease    1 stent    Dysphagia    Headache(784.0)    related to cervical issues   History of colonic polyps    Hyperlipidemia    takes Crestor daily   Hypertension    takes Coreg and Diovan daily   Myocardial infarction (HCC) 2019   Osteoporosis    was taking shot 2xyr;but not taking anymore   Peripheral vascular disease (HCC)    Seasonal allergies    takes Zyrtec nightly    Family History  Problem Relation Age of Onset   Heart disease Mother        Heart Dissease before age 42   Heart attack Mother    Cancer Father    Leukemia Sister    Cancer Sister    Lupus Sister    Cancer Brother    Heart disease Brother        Amputation   Heart attack Brother    Heart disease Brother    Heart attack Brother    Dementia Other    Healthy Son    Healthy Daughter    Healthy Daughter    Healthy Daughter    Anesthesia problems Neg Hx    Hypotension Neg Hx    Malignant hyperthermia Neg  Hx    Pseudochol deficiency Neg Hx    Past Surgical History:  Procedure Laterality Date   ABDOMINAL HYSTERECTOMY  70's   BUNIONECTOMY     left foot   CARDIAC CATHETERIZATION  2009/2011   1 stent placed   CATARACT EXTRACTION Bilateral    CHOLECYSTECTOMY  2009   COLONOSCOPY     ESOPHAGOGASTRODUODENOSCOPY     LEFT HEART CATH AND CORONARY ANGIOGRAPHY N/A 01/16/2018   Procedure: LEFT HEART CATH AND CORONARY ANGIOGRAPHY;  Surgeon: Elder Negus, MD;  [facility];  Service: Cardiovascular;  Laterality: N/A;   TONSILLECTOMY     at age 77   Social History   Social History Narrative   Not on file   Immunization History   Administered Date(s) Administered   Covid-19, Mrna,Vaccine(Spikevax)37yrs and older 12/03/2021   DTaP 10/25/2012   Fluad Quad(high Dose 65+) 12/03/2019, 11/19/2020, 03/23/2022   Influenza, High Dose Seasonal PF 12/21/2017, 11/15/2018   Moderna Covid-19 Vaccine Bivalent Booster 64yrs & up 11/27/2020   PFIZER(Purple Top)SARS-COV-2 Vaccination 03/01/2019, 03/22/2019, 11/13/2019, 06/09/2020   Pneumococcal Conjugate-13 07/15/2013   Pneumococcal Polysaccharide-23 03/12/2012   Zoster Recombinant(Shingrix) 11/01/2018, 02/06/2019     Objective: Vital Signs: There were no vitals taken for this visit.   Physical Exam Vitals and nursing note reviewed.  Constitutional:      Appearance: She is well-developed.  HENT:     Head: Normocephalic and atraumatic.  Eyes:     Conjunctiva/sclera: Conjunctivae normal.  Cardiovascular:     Rate and Rhythm: Normal rate and regular rhythm.     Heart sounds: Normal heart sounds.  Pulmonary:     Effort: Pulmonary effort is normal.     Breath sounds: Normal breath sounds.  Abdominal:     General: Bowel sounds are normal.     Palpations: Abdomen is soft.  Musculoskeletal:     Cervical back: Normal range of motion.  Lymphadenopathy:     Cervical: No cervical adenopathy.  Skin:    General: Skin is warm and dry.     Capillary Refill: Capillary refill takes less than 2 seconds.  Neurological:     Mental Status: She is alert and oriented to person, place, and time.  Psychiatric:        Behavior: Behavior normal.      Musculoskeletal Exam: ***  CDAI Exam: CDAI Score: -- Patient Global: --; Provider Global: -- Swollen: --; Tender: -- Joint Exam 09/08/2022   No joint exam has been documented for this visit   There is currently no information documented on the homunculus. Go to the Rheumatology activity and complete the homunculus joint exam.  Investigation: No additional findings.  Imaging: No results found.  Recent Labs: Lab Results   Component Value Date   WBC 9.1 06/29/2022   HGB 11.7 06/29/2022   PLT 449 (H) 06/29/2022   NA 145 06/29/2022   K 4.9 06/29/2022   CL 108 06/29/2022   CO2 29 06/29/2022   GLUCOSE 98 06/29/2022   BUN 13 06/29/2022   CREATININE 1.06 (H) 06/29/2022   BILITOT 0.4 06/29/2022   ALKPHOS 164 (H) 03/23/2022   AST 12 06/29/2022   ALT 6 06/29/2022   PROT 6.7 06/29/2022   ALBUMIN 4.4 03/23/2022   CALCIUM 9.6 06/29/2022   GFRAA 46 (L) 12/30/2019    Speciality Comments: No specialty comments available.  Procedures:  No procedures performed Allergies: Codeine and Ibuprofen   Assessment / Plan:     Visit Diagnoses: Localized osteoporosis without current  pathological fracture  Medication monitoring encounter  Vitamin D deficiency  Stage 3b chronic kidney disease (HCC)  History of gastroesophageal reflux (GERD)  Spinal stenosis of lumbosacral region  Carotid stenosis, asymptomatic, bilateral  Coronary artery disease of native artery of native heart with stable angina pectoris (HCC)  Essential hypertension  NSTEMI (non-ST elevated myocardial infarction) (HCC)  Diverticulosis of colon  Neuralgia, postherpetic  Hypertensive nephropathy  Bilateral carotid bruits  Class 3 severe obesity due to excess calories with serious comorbidity and body mass index (BMI) of 45.0 to 49.9 in adult [facility])  Pure hypercholesterolemia  Personal history of colonic polyps  Prediabetes  Orders: No orders of the defined types were placed in this encounter.  No orders of the defined types were placed in this encounter.     Follow-Up Instructions: No follow-ups on file.   Gearldine Bienenstock, PA-C  Note - This record has been created using Dragon software.  Chart creation errors have been sought, but may not always  have been located. Such creation errors do not reflect on  the standard of medical care.

## 2022-09-08 ENCOUNTER — Ambulatory Visit: Payer: Self-pay | Admitting: Physician Assistant

## 2022-09-08 ENCOUNTER — Other Ambulatory Visit: Payer: Self-pay | Admitting: Internal Medicine

## 2022-09-08 DIAGNOSIS — R0989 Other specified symptoms and signs involving the circulatory and respiratory systems: Secondary | ICD-10-CM

## 2022-09-08 DIAGNOSIS — I25118 Atherosclerotic heart disease of native coronary artery with other forms of angina pectoris: Secondary | ICD-10-CM

## 2022-09-08 DIAGNOSIS — I129 Hypertensive chronic kidney disease with stage 1 through stage 4 chronic kidney disease, or unspecified chronic kidney disease: Secondary | ICD-10-CM

## 2022-09-08 DIAGNOSIS — B0229 Other postherpetic nervous system involvement: Secondary | ICD-10-CM

## 2022-09-08 DIAGNOSIS — R7303 Prediabetes: Secondary | ICD-10-CM

## 2022-09-08 DIAGNOSIS — E7800 Pure hypercholesterolemia, unspecified: Secondary | ICD-10-CM

## 2022-09-08 DIAGNOSIS — I214 Non-ST elevation (NSTEMI) myocardial infarction: Secondary | ICD-10-CM

## 2022-09-08 DIAGNOSIS — K5730 Diverticulosis of large intestine without perforation or abscess without bleeding: Secondary | ICD-10-CM

## 2022-09-08 DIAGNOSIS — I6523 Occlusion and stenosis of bilateral carotid arteries: Secondary | ICD-10-CM

## 2022-09-08 DIAGNOSIS — Z8719 Personal history of other diseases of the digestive system: Secondary | ICD-10-CM

## 2022-09-08 DIAGNOSIS — I10 Essential (primary) hypertension: Secondary | ICD-10-CM

## 2022-09-08 DIAGNOSIS — Z86010 Personal history of colonic polyps: Secondary | ICD-10-CM

## 2022-09-08 DIAGNOSIS — E559 Vitamin D deficiency, unspecified: Secondary | ICD-10-CM

## 2022-09-08 DIAGNOSIS — Z5181 Encounter for therapeutic drug level monitoring: Secondary | ICD-10-CM

## 2022-09-08 DIAGNOSIS — N1832 Chronic kidney disease, stage 3b: Secondary | ICD-10-CM

## 2022-09-08 DIAGNOSIS — M816 Localized osteoporosis [Lequesne]: Secondary | ICD-10-CM

## 2022-09-08 DIAGNOSIS — M4807 Spinal stenosis, lumbosacral region: Secondary | ICD-10-CM

## 2022-09-08 NOTE — Telephone Encounter (Signed)
Spoke with patient - Prolia labs will be repeated today at [facility] in [place] (CBC and CMP released today).  Will complete Prolia on 09/13/2022. Patient is feeling better.F/u with Sherron Ales, PA-C scheduled for 09/27/2022. Will need CMET repeat at that visit.  Chesley Mires, PharmD, MPH, BCPS, CPP Clinical Pharmacist (Rheumatology and Pulmonology)

## 2022-09-09 DIAGNOSIS — Z5181 Encounter for therapeutic drug level monitoring: Secondary | ICD-10-CM | POA: Diagnosis not present

## 2022-09-09 DIAGNOSIS — Z79899 Other long term (current) drug therapy: Secondary | ICD-10-CM | POA: Diagnosis not present

## 2022-09-09 DIAGNOSIS — M816 Localized osteoporosis [Lequesne]: Secondary | ICD-10-CM | POA: Diagnosis not present

## 2022-09-11 ENCOUNTER — Other Ambulatory Visit: Payer: Self-pay | Admitting: Internal Medicine

## 2022-09-11 NOTE — Progress Notes (Signed)
CBC stable.  Creatinine remains elevated and GFR is low-47.  Calcium WNL Alk phos is slightly elevated but stable.  LFTs WNL   Ok to proceed with prolia

## 2022-09-12 NOTE — Progress Notes (Signed)
Prolia appt is already scheduled for 09/13/2022. Med in fridge  Chesley Mires, PharmD, MPH, BCPS, CPP Clinical Pharmacist (Rheumatology and Pulmonology)

## 2022-09-13 ENCOUNTER — Ambulatory Visit: Payer: Medicare HMO | Attending: Rheumatology | Admitting: Pharmacist

## 2022-09-13 DIAGNOSIS — M816 Localized osteoporosis [Lequesne]: Secondary | ICD-10-CM

## 2022-09-13 DIAGNOSIS — Z7689 Persons encountering health services in other specified circumstances: Secondary | ICD-10-CM

## 2022-09-13 DIAGNOSIS — Z5181 Encounter for therapeutic drug level monitoring: Secondary | ICD-10-CM

## 2022-09-13 MED ORDER — DENOSUMAB 60 MG/ML ~~LOC~~ SOSY
60.0000 mg | PREFILLED_SYRINGE | Freq: Once | SUBCUTANEOUS | Status: AC
  Administered 2022-09-13: 60 mg via SUBCUTANEOUS

## 2022-09-13 NOTE — Progress Notes (Unsigned)
Office Visit Note  Patient: Kathryn [name]             Date of Birth: Jun 30, 1940           MRN: [id number]             PCP: [name], MD Referring: [name], MD Visit Date: 09/27/2022 Occupation: @GUAROCC @  Subjective:  Follow up-prolia   History of Present Illness: HERO PREM is a 82 y.o. female with history of osteoporosis. Patient was started on prolia on 09/13/22.  She tolerated Prolia without any side effects or injection site reactions.  She is taking a calcium supplement daily.  She denies any falls or fractures since initiating therapy. Patient continues to have chronic pain and stiffness in her neck and lower back.  She has had injections in her lower back performed by Dr. Regino Schultze in the past which provided temporary relief.  She has been taking Lyrica and tizanidine as prescribed.  She takes Tylenol for pain relief.  She uses Voltaren gel as needed.  Patient experiences generalized aching but denies any joint swelling.  Most of her discomfort is in her neck and trapezius muscles bilaterally.  She has intermittent symptoms of right sided sciatica.    Activities of Daily Living:  Patient reports morning stiffness for 1 hour.   Patient Reports nocturnal pain.  Difficulty dressing/grooming: Denies Difficulty climbing stairs: Denies Difficulty getting out of chair: Denies Difficulty using hands for taps, buttons, cutlery, and/or writing: Denies  Review of Systems  Constitutional:  Positive for fatigue.  HENT:  Negative for mouth sores and mouth dryness.   Eyes:  Negative for dryness.  Respiratory:  Negative for shortness of breath.   Cardiovascular:  Negative for chest pain and palpitations.  Gastrointestinal:  Negative for blood in stool, constipation and diarrhea.  Endocrine: Negative for increased urination.  Genitourinary:  Negative for involuntary urination.  Musculoskeletal:  Positive for joint pain, gait problem, joint pain, myalgias, morning  stiffness and myalgias. Negative for joint swelling, muscle weakness and muscle tenderness.  Skin:  Positive for rash. Negative for color change, hair loss and sensitivity to sunlight.  Allergic/Immunologic: Negative for susceptible to infections.  Neurological:  Negative for dizziness and headaches.  Hematological:  Negative for swollen glands.  Psychiatric/Behavioral:  Positive for sleep disturbance. Negative for depressed mood. The patient is not nervous/anxious.     PMFS History:  Patient Active Problem List   Diagnosis Date Noted   COVID-19 09/05/2022   Change in bowel habit 04/21/2022   Diverticulosis of colon 04/21/2022   Dysphagia 04/21/2022   Family history of malignant neoplasm of digestive organs 04/21/2022   Class 3 severe obesity due to excess calories with serious comorbidity and body mass index (BMI) of 45.0 to 49.9 in adult [facility]) 04/21/2022   Myositis 04/21/2022   Periumbilical pain 04/21/2022   Personal history of colonic polyps 04/21/2022   Bilateral carotid bruits 08/10/2021   Stage 3a chronic kidney disease (HCC) 06/05/2021   Grief 06/05/2021   Prediabetes 06/05/2021   Spinal stenosis of lumbosacral region 06/05/2021   Coronary artery disease of native artery of native heart with stable angina pectoris (HCC) 06/07/2018   Anxiety 01/18/2018   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   Chronic renal disease, 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   Other abnormal glucose 01/15/2018   Neuralgia, 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   NSTEMI (non-ST elevated myocardial infarction) (HCC) 01/14/2018   Essential hypertension 01/14/2018   Hyperlipidemia 01/14/2018   Carotid stenosis, asymptomatic, bilateral 09/06/2012  Past Medical History:  Diagnosis Date   Anxiety    takes Xanax bid prn   Arthritis    Bruises easily    pt is on Plavix   Chronic back pain    buldging disc   Chronic neck pain    Coronary artery disease    1 stent    Dysphagia    Headache(784.0)    related to cervical  issues   History of colonic polyps    Hyperlipidemia    takes Crestor daily   Hypertension    takes Coreg and Diovan daily   Myocardial infarction (HCC) 2019   Osteoporosis    was taking shot 2xyr;but not taking anymore   Peripheral vascular disease (HCC)    Seasonal allergies    takes Zyrtec nightly    Family History  Problem Relation Age of Onset   Heart disease Mother        Heart Dissease before age 86   Heart attack Mother    Cancer Father    Leukemia Sister    Cancer Sister    Lupus Sister    Cancer Brother    Heart disease Brother        Amputation   Heart attack Brother    Heart disease Brother    Heart attack Brother    Dementia Other    Healthy Son    Healthy Daughter    Healthy Daughter    Healthy Daughter    Anesthesia problems Neg Hx    Hypotension Neg Hx    Malignant hyperthermia Neg Hx    Pseudochol deficiency Neg Hx    Past Surgical History:  Procedure Laterality Date   ABDOMINAL HYSTERECTOMY  70's   BUNIONECTOMY     left foot   CARDIAC CATHETERIZATION  2009/2011   1 stent placed   CATARACT EXTRACTION Bilateral    CHOLECYSTECTOMY  2009   COLONOSCOPY     ESOPHAGOGASTRODUODENOSCOPY     LEFT HEART CATH AND CORONARY ANGIOGRAPHY N/A 01/16/2018   Procedure: LEFT HEART CATH AND CORONARY ANGIOGRAPHY;  Surgeon: Elder Negus, MD;  Location: [facility];  Service: Cardiovascular;  Laterality: N/A;   TONSILLECTOMY     at age 61   Social History   Social History Narrative   Not on file   Immunization History  Administered Date(s) Administered   Covid-19, Mrna,Vaccine(Spikevax)93yrs and older 12/03/2021   DTaP 10/25/2012   Fluad Quad(high Dose 65+) 12/03/2019, 11/19/2020, 03/23/2022   Influenza, High Dose Seasonal PF 12/21/2017, 11/15/2018   Moderna Covid-19 Vaccine Bivalent Booster 23yrs & up 11/27/2020   PFIZER(Purple Top)SARS-COV-2 Vaccination 03/01/2019, 03/22/2019, 11/13/2019, 06/09/2020   Pneumococcal Conjugate-13 07/15/2013    Pneumococcal Polysaccharide-23 03/12/2012   Zoster Recombinant(Shingrix) 11/01/2018, 02/06/2019     Objective: Vital Signs: BP (!) 141/73 (BP Location: Right Arm, Patient Position: Sitting, Cuff Size: Normal)   Pulse 65   Resp 15   Ht 5\' 3"  (1.6 m)   Wt 223 lb 9.6 oz (101.4 kg)   BMI 39.61 kg/m    Physical Exam Vitals and nursing note reviewed.  Constitutional:      Appearance: She is well-developed.  HENT:     Head: Normocephalic and atraumatic.  Eyes:     Conjunctiva/sclera: Conjunctivae normal.  Cardiovascular:     Rate and Rhythm: Normal rate and regular rhythm.     Heart sounds: Normal heart sounds.  Pulmonary:     Effort: Pulmonary effort is normal.     Breath sounds: Normal breath  sounds.  Abdominal:     General: Bowel sounds are normal.     Palpations: Abdomen is soft.  Musculoskeletal:     Cervical back: Normal range of motion.  Lymphadenopathy:     Cervical: No cervical adenopathy.  Skin:    General: Skin is warm and dry.     Capillary Refill: Capillary refill takes less than 2 seconds.  Neurological:     Mental Status: She is alert and oriented to person, place, and time.  Psychiatric:        Behavior: Behavior normal.      Musculoskeletal Exam: C-spine has limited range of motion without rotation.  Trapezius muscle tension tenderness bilaterally.  Some midline spinal tenderness in the lumbar region.  Some tenderness over the left SI joint.  Shoulder joints, elbow joints, wrist joints, MCPs, PIPs, DIPs have good range of motion with no synovitis.  Complete fist formation bilaterally.  DIP prominence noted.  Hip joints have good range of motion with no groin pain.  Knee joints have good range of motion with no warmth or effusion.  Ankle joints have good range of motion with no tenderness or joint swelling.  CDAI Exam: CDAI Score: -- Patient Global: --; Provider Global: -- Swollen: --; Tender: -- Joint Exam 09/27/2022   No joint exam has been documented  for this visit   There is currently no information documented on the homunculus. Go to the Rheumatology activity and complete the homunculus joint exam.  Investigation: No additional findings.  Imaging: No results found.  Recent Labs: Lab Results  Component Value Date   WBC 9.2 09/09/2022   HGB 11.4 09/09/2022   PLT 391 09/09/2022   NA 145 (H) 09/09/2022   K 5.2 09/09/2022   CL 106 09/09/2022   CO2 26 09/09/2022   GLUCOSE 103 (H) 09/09/2022   BUN 21 09/09/2022   CREATININE 1.16 (H) 09/09/2022   BILITOT <0.2 09/09/2022   ALKPHOS 144 (H) 09/09/2022   AST 15 09/09/2022   ALT 12 09/09/2022   PROT 6.3 09/09/2022   ALBUMIN 4.1 09/09/2022   CALCIUM 9.5 09/09/2022   GFRAA 46 (L) 12/30/2019    Speciality Comments: No specialty comments available.  Procedures:  No procedures performed Allergies: Codeine and Ibuprofen   Assessment / Plan:     Visit Diagnoses: Localized osteoporosis without current pathological fracture:  DEXA 05/20/22: The BMD measured at Femur Neck Right is 0.639 g/cm2 with a T-score of -2.9.  Patient was nave to osteoporosis treatment. She has not had any recurrent falls.  No history of fractures.  No known family history of osteoporosis. She has been taking calcium 600 mg daily and vitamin D 1000 units daily.   Different treatment options were discussed in detail at initial office visit.   Avoided the use of bisphosphonates given history of GERD and chronic kidney disease.  Avoided the use of Evenity given cardiovascular history including MI and coronary artery disease. Patient declined the use of Forteo/Tymlos due to being hesitant to proceed with a daily injections. First dose of prolia administered on 09/13/22--tolerated Prolia without any side effects or injection site reactions.  Medication monitoring encounter - Prolia administered on 09/13/2022.  CMP with GFR updated today.  Plan: COMPLETE METABOLIC PANEL WITH GFR  Vitamin D deficiency: Vitamin D was 42  within normal limits on 06/29/2022.  She has been taking Vitamin D 1000 units daily.  History of gastroesophageal reflux (GERD): Plan to avoid the use of bisphosphonates.  Stage 3b chronic kidney  disease [facility]): CMP with GFR updated today.  Spinal stenosis of lumbosacral region: Previously under the care of Dr. Jacklynn Barnacle has been experiencing right-sided sciatica.  Patient was advised to follow back up with Dr. Regino Schultze.  She plans on remaining on Lyrica and tizanidine as prescribed.  She is been taking Tylenol for pain relief.  Discussed possibly switching from Zoloft to Cymbalta to see if her pain level will be better controlled.  Patient plans on further discussing with Dr. Allyne Gee.  Other medical conditions are listed as follows:  Carotid stenosis, asymptomatic, bilateral  Coronary artery disease of native artery of native heart with stable angina pectoris (HCC)  Essential hypertension: Blood pressure was 143/77 today in the office.  Her blood pressure was rechecked prior to leaving.  NSTEMI (non-ST elevated myocardial infarction) (HCC)  Diverticulosis of colon  Neuralgia, postherpetic  Hypertensive nephropathy  Bilateral carotid bruits  Class 3 severe obesity due to excess calories with serious comorbidity and body mass index (BMI) of 45.0 to 49.9 in adult [facility])  Pure hypercholesterolemia  Personal history of colonic polyps  Prediabetes  Orders: Orders Placed This Encounter  Procedures   COMPLETE METABOLIC PANEL WITH GFR   No orders of the defined types were placed in this encounter.  Follow-Up Instructions: Return in about 6 months (around 03/30/2023) for Osteoporosis.   Gearldine Bienenstock, PA-C  Note - This record has been created using Dragon software.  Chart creation errors have been sought, but may not always  have been located. Such creation errors do not reflect on  the standard of medical care.

## 2022-09-13 NOTE — Progress Notes (Signed)
Pharmacy Note  Subjective:   Patient presents to clinic today to receive her first dose of Prolia. She had COVID infection about 2 weeks ago but is feeling better now.  Patient running a fever or have signs/symptoms of infection? No Patient currently on antibiotics for the treatment of infection? No Patient had fall in the last 6 months?  No  Patient taking calcium 1200 mg daily through diet or supplement and at least 800 units vitamin D? Yes  Objective: CMP     Component Value Date/Time   NA 145 (H) 09/09/2022 1639   K 5.2 09/09/2022 1639   CL 106 09/09/2022 1639   CO2 26 09/09/2022 1639   GLUCOSE 103 (H) 09/09/2022 1639   GLUCOSE 98 06/29/2022 1133   BUN 21 09/09/2022 1639   CREATININE 1.16 (H) 09/09/2022 1639   CREATININE 1.06 (H) 06/29/2022 1133   CALCIUM 9.5 09/09/2022 1639   PROT 6.3 09/09/2022 1639   ALBUMIN 4.1 09/09/2022 1639   AST 15 09/09/2022 1639   ALT 12 09/09/2022 1639   ALKPHOS 144 (H) 09/09/2022 1639   BILITOT <0.2 09/09/2022 1639   GFRNONAA 40 (L) 12/30/2019 1235   GFRAA 46 (L) 12/30/2019 1235    CBC    Component Value Date/Time   WBC 9.2 09/09/2022 1639   WBC 9.1 06/29/2022 1133   RBC 4.78 09/09/2022 1639   RBC 5.13 (H) 06/29/2022 1133   HGB 11.4 09/09/2022 1639   HCT 37.0 09/09/2022 1639   PLT 391 09/09/2022 1639   MCV 77 (L) 09/09/2022 1639   MCH 23.8 (L) 09/09/2022 1639   MCH 22.8 (L) 06/29/2022 1133   MCHC 30.8 (L) 09/09/2022 1639   MCHC 30.0 (L) 06/29/2022 1133   RDW 15.7 (H) 09/09/2022 1639   LYMPHSABS 2.5 09/09/2022 1639   MONOABS 1.4 (H) 08/01/2017 1725   EOSABS 0.3 09/09/2022 1639   BASOSABS 0.1 09/09/2022 1639    Lab Results  Component Value Date   VD25OH 42 06/29/2022    T-score: DEXA 05/20/22: The BMD measured at Femur Neck Right is 0.639 g/cm2 with a T-score of -2.9.   Assessment/Plan:  Patient tolerated injection without issue.   Administrations This Visit     denosumab (PROLIA) injection 60 mg     Admin  Date 09/13/2022 Action Given Dose 60 mg Route Subcutaneous Documented By Murrell Redden, RPH-CPP           Prolia Rx received from [facility] Rx# [id number] NDC: 40981-191-47 Lot: [id number] Exp: 14 Oct 2024  Patient is to return in 10-14 days for labs to monitor for hypocalcemia.  Future orders placed for CMET. Patient has f/u appt scheduled   All questions encouraged and answered.  Instructed patient to call with any further questions or concerns.  Rickey Primus, PharmD Candidate [employer] Class of 2025  Chesley Mires, PharmD, MPH, BCPS, CPP Clinical Pharmacist (Rheumatology and Pulmonology)

## 2022-09-13 NOTE — Patient Instructions (Signed)
Hypocalcemia, Adult Hypocalcemia is when the level of calcium in a person's blood is below normal. Calcium is a mineral that is used by the body in many ways. Not having enough blood calcium can affect the nervous system. This can lead to problems with the muscles, the heart, and the brain. What are the causes? This condition may be caused by: A deficiency of vitamin D or magnesium or both. Decreased levels of parathyroid hormone (hypoparathyroidism). Kidney function problems. Low levels of a body protein called albumin. Inflammation of the pancreas (pancreatitis). Not taking in enough vitamins and minerals in the diet or having intestinal problems that interfere with absorption of nutrients. Certain medicines. What are the signs or symptoms? Symptoms of this condition may include: Numbness and tingling in the fingers, toes, or around the mouth. Muscle twitching, aches, or cramps, especially in the legs, feet, and back. Spasm of the voice box. This may make it difficult to breathe or speak. Fast heartbeats (palpitations) and abnormal heart rhythms (dysrhythmias). Shaking uncontrollably (seizures). Memory problems, confusion, or difficulty thinking. Depression, anxiety, irritability, or changes in personality. Long-term (chronic) symptoms of this condition may include: Coarse, brittle hair and nails. Dry skin or lasting skin diseases. These include psoriasis, eczema, or dermatitis. Dental cavities. Clouding of the eye lens (cataracts). Some people may not have any symptoms, especially if they have chronic hypocalcemia. How is this diagnosed?  This condition is usually diagnosed with a blood test. You may also have other tests to help determine the underlying cause of the condition. This may include more blood tests and imaging tests. How is this treated? This condition may be treated with: Calcium given by mouth or through an IV. The method used for giving calcium will depend on the  severity of the condition. If your condition is severe, you may need to be closely monitored in the hospital. Giving other minerals (electrolytes), such as magnesium. Other treatment will depend on the cause of the condition. Follow these instructions at home: Follow diet instructions from your health care provider or dietitian. Take supplements only as told by your health care provider. Do not take an iron supplement within 2 hours of taking a calcium supplement. Keep all follow-up visits. This is important. Contact a health care provider if: You have increased muscle twitching or cramps. You have new swelling in the feet, ankles, or legs. You develop changes in mood, memory, or personality. Get help right away if: You have chest pain. You have persistent rapid or irregular heartbeats. You have trouble breathing. You faint. You start to have seizures. You have confusion. These symptoms may represent a serious problem that is an emergency. Do not wait to see if the symptoms will go away. Get medical help right away. Call your local emergency services (911 in the [place] Do not drive yourself to the hospital. Summary Hypocalcemia is when the level of calcium in a person's blood is below normal. Not having enough blood calcium can affect the nervous system. This condition may be treated with calcium given by mouth or through an IV, or by receiving other minerals. Other treatment depends on the cause of the condition. Take supplements only as told by your health care provider. Contact a health care provider if you have new or worsening symptoms. Keep all follow-up visits. This is important. This information is not intended to replace advice given to you by your health care provider. Make sure you discuss any questions you have with your health care provider. Document Revised:  07/08/2020 Document Reviewed: 07/08/2020 Elsevier Patient Education  2024 [employer].

## 2022-09-14 ENCOUNTER — Ambulatory Visit: Payer: Self-pay

## 2022-09-14 DIAGNOSIS — H26492 Other secondary cataract, left eye: Secondary | ICD-10-CM | POA: Diagnosis not present

## 2022-09-14 NOTE — Patient Outreach (Signed)
  Care Coordination   09/14/2022 Name: Kathryn [name] MRN: [id number] DOB: November 24, 1940   Care Coordination Outreach Attempts:  An unsuccessful telephone outreach was attempted for a scheduled appointment today.  Follow Up Plan:  Additional outreach attempts will be made to offer the patient care coordination information and services.   Encounter Outcome:  No Answer   Care Coordination Interventions:  No, not indicated    Delsa Sale, RN, BSN, CCM Care Management Coordinator [facility] Care Management  Direct Phone: [phone]

## 2022-09-22 ENCOUNTER — Other Ambulatory Visit: Payer: Self-pay | Admitting: Internal Medicine

## 2022-09-26 ENCOUNTER — Other Ambulatory Visit: Payer: Self-pay | Admitting: Internal Medicine

## 2022-09-26 DIAGNOSIS — F419 Anxiety disorder, unspecified: Secondary | ICD-10-CM

## 2022-09-27 ENCOUNTER — Encounter: Payer: Self-pay | Admitting: Physician Assistant

## 2022-09-27 ENCOUNTER — Ambulatory Visit: Payer: Medicare HMO | Attending: Physician Assistant | Admitting: Physician Assistant

## 2022-09-27 VITALS — BP 141/73 | HR 65 | Resp 15 | Ht 63.0 in | Wt 223.6 lb

## 2022-09-27 DIAGNOSIS — N1832 Chronic kidney disease, stage 3b: Secondary | ICD-10-CM

## 2022-09-27 DIAGNOSIS — I129 Hypertensive chronic kidney disease with stage 1 through stage 4 chronic kidney disease, or unspecified chronic kidney disease: Secondary | ICD-10-CM

## 2022-09-27 DIAGNOSIS — E559 Vitamin D deficiency, unspecified: Secondary | ICD-10-CM | POA: Diagnosis not present

## 2022-09-27 DIAGNOSIS — R7303 Prediabetes: Secondary | ICD-10-CM

## 2022-09-27 DIAGNOSIS — I6523 Occlusion and stenosis of bilateral carotid arteries: Secondary | ICD-10-CM

## 2022-09-27 DIAGNOSIS — M4807 Spinal stenosis, lumbosacral region: Secondary | ICD-10-CM | POA: Diagnosis not present

## 2022-09-27 DIAGNOSIS — I25118 Atherosclerotic heart disease of native coronary artery with other forms of angina pectoris: Secondary | ICD-10-CM

## 2022-09-27 DIAGNOSIS — R0989 Other specified symptoms and signs involving the circulatory and respiratory systems: Secondary | ICD-10-CM

## 2022-09-27 DIAGNOSIS — M816 Localized osteoporosis [Lequesne]: Secondary | ICD-10-CM | POA: Diagnosis not present

## 2022-09-27 DIAGNOSIS — I10 Essential (primary) hypertension: Secondary | ICD-10-CM

## 2022-09-27 DIAGNOSIS — Z8719 Personal history of other diseases of the digestive system: Secondary | ICD-10-CM | POA: Diagnosis not present

## 2022-09-27 DIAGNOSIS — Z6841 Body Mass Index (BMI) 40.0 and over, adult: Secondary | ICD-10-CM

## 2022-09-27 DIAGNOSIS — K5730 Diverticulosis of large intestine without perforation or abscess without bleeding: Secondary | ICD-10-CM

## 2022-09-27 DIAGNOSIS — Z5181 Encounter for therapeutic drug level monitoring: Secondary | ICD-10-CM

## 2022-09-27 DIAGNOSIS — E7800 Pure hypercholesterolemia, unspecified: Secondary | ICD-10-CM

## 2022-09-27 DIAGNOSIS — B0229 Other postherpetic nervous system involvement: Secondary | ICD-10-CM

## 2022-09-27 DIAGNOSIS — I214 Non-ST elevation (NSTEMI) myocardial infarction: Secondary | ICD-10-CM

## 2022-09-27 DIAGNOSIS — Z86010 Personal history of colonic polyps: Secondary | ICD-10-CM

## 2022-09-28 ENCOUNTER — Other Ambulatory Visit (HOSPITAL_COMMUNITY): Payer: Self-pay

## 2022-09-28 NOTE — Progress Notes (Signed)
Creatinine is borderline elevated but has improved-1.02. GFR is low but improved-55. Rest of CMP WNL

## 2022-10-14 ENCOUNTER — Other Ambulatory Visit: Payer: Self-pay | Admitting: Internal Medicine

## 2022-10-14 DIAGNOSIS — M48061 Spinal stenosis, lumbar region without neurogenic claudication: Secondary | ICD-10-CM

## 2022-10-20 ENCOUNTER — Other Ambulatory Visit: Payer: Self-pay | Admitting: Internal Medicine

## 2022-10-20 DIAGNOSIS — M48061 Spinal stenosis, lumbar region without neurogenic claudication: Secondary | ICD-10-CM

## 2022-10-20 MED ORDER — PREGABALIN 75 MG PO CAPS
ORAL_CAPSULE | ORAL | 0 refills | Status: DC
Start: 2022-10-20 — End: 2023-09-08

## 2022-10-25 DIAGNOSIS — M533 Sacrococcygeal disorders, not elsewhere classified: Secondary | ICD-10-CM | POA: Diagnosis not present

## 2022-11-02 ENCOUNTER — Ambulatory Visit: Payer: Medicare HMO | Admitting: Cardiology

## 2022-11-02 ENCOUNTER — Encounter: Payer: Self-pay | Admitting: Cardiology

## 2022-11-02 VITALS — BP 106/66 | HR 78 | Resp 16 | Ht 63.0 in | Wt 208.4 lb

## 2022-11-02 DIAGNOSIS — I25118 Atherosclerotic heart disease of native coronary artery with other forms of angina pectoris: Secondary | ICD-10-CM

## 2022-11-02 DIAGNOSIS — E782 Mixed hyperlipidemia: Secondary | ICD-10-CM

## 2022-11-02 DIAGNOSIS — I6523 Occlusion and stenosis of bilateral carotid arteries: Secondary | ICD-10-CM | POA: Diagnosis not present

## 2022-11-02 DIAGNOSIS — I10 Essential (primary) hypertension: Secondary | ICD-10-CM | POA: Diagnosis not present

## 2022-11-02 NOTE — Progress Notes (Signed)
Primary Physician/Referring:  [name], MD  Patient ID: Kathryn [name], female    DOB: 1940/11/10, 82 y.o.   MRN: [id number]  Chief Complaint  Patient presents with   Coronary artery disease of native artery of native heart wi   Hyperlipidemia   HPI:    NASHAI VANDENBROEK  is a 82 y.o. 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 Coronary Angiography on 01/16/18 that showed no obstructive CAD and patent stents from 2009. Patient is currently taking pravastatin and zetia   Patient presents for 68-month follow-up.   Patient is feeling well overall without specific complaints except for severe back pain and sciatica right leg. Denies chest pain, worsening dyspnea.  Past Medical History:  Diagnosis Date   Anxiety    takes Xanax bid prn   Arthritis    Bruises easily    pt is on Plavix   Chronic back pain    buldging disc   Chronic neck pain    Coronary artery disease    1 stent    Dysphagia    Headache(784.0)    related to cervical issues   History of colonic polyps    Hyperlipidemia    takes Crestor daily   Hypertension    takes Coreg and Diovan daily   Myocardial infarction (HCC) 2019   Osteoporosis    was taking shot 2xyr;but not taking anymore   Peripheral vascular disease (HCC)    Seasonal allergies    takes Zyrtec nightly    Past Surgical History:  Procedure Laterality Date   ABDOMINAL HYSTERECTOMY  70's   BUNIONECTOMY     left foot   CARDIAC CATHETERIZATION  2009/2011   1 stent placed   CATARACT EXTRACTION Bilateral    CHOLECYSTECTOMY  2009   COLONOSCOPY     ESOPHAGOGASTRODUODENOSCOPY     LEFT HEART CATH AND CORONARY ANGIOGRAPHY N/A 01/16/2018   Procedure: LEFT HEART CATH AND CORONARY ANGIOGRAPHY;  Surgeon: Elder Negus, MD;  Location: [facility];  Service: Cardiovascular;  Laterality: N/A;   TONSILLECTOMY     at age 66   Social  History   Tobacco Use   Smoking status: Former    Current packs/day: 0.00    Average packs/day: 0.3 packs/day for 5.0 years (1.3 ttl pk-yrs)    Types: Cigarettes    Start date: 11/28/1963    Quit date: 11/27/1968    Years since quitting: 53.9    Passive exposure: Never   Smokeless tobacco: Never  Substance Use Topics   Alcohol use: No   Marital status: Married  ROS  Review of Systems  Cardiovascular:  Positive for dyspnea on exertion (mild and chronic). Negative for chest pain and leg swelling.   Objective      11/02/2022    1:01 PM 09/27/2022    2:36 PM 09/27/2022    1:34 PM  Vitals with BMI  Height 5\' 3"   5\' 3"   Weight 208 lbs 6 oz  223 lbs 10 oz  BMI 36.93  39.62  Systolic 106 141 409  Diastolic 66 73 77  Pulse 78 65 72      Physical Exam Vitals reviewed.  Constitutional:      Appearance: She is obese.  Neck:     Vascular: Carotid bruit (bilateral) present. No JVD.  Cardiovascular:     Rate and Rhythm: Normal rate and regular rhythm.     Pulses:  Intact distal pulses.          Carotid pulses are  on the right side with bruit and  on the left side with bruit.    Heart sounds: Murmur heard.     Harsh midsystolic murmur is present with a grade of 2/6 at the upper right sternal border radiating to the neck.     No gallop.  Pulmonary:     Effort: Pulmonary effort is normal.     Breath sounds: Normal breath sounds.  Abdominal:     General: Bowel sounds are normal.     Palpations: Abdomen is soft.  Musculoskeletal:     Right lower leg: No edema.     Left lower leg: No edema.    Laboratory examination:   Lab Results  Component Value Date   NA 143 09/27/2022   K 5.1 09/27/2022   CO2 27 09/27/2022   GLUCOSE 75 09/27/2022   BUN 19 09/27/2022   CREATININE 1.02 (H) 09/27/2022   CALCIUM 9.3 09/27/2022   EGFR 55 (L) 09/27/2022   GFRNONAA 40 (L) 12/30/2019     Recent Labs    06/29/22 1133 09/09/22 1639 09/27/22 1455  NA 145 145* 143  K 4.9 5.2 5.1  CL  108 106 108  CO2 29 26 27   GLUCOSE 98 103* 75  BUN 13 21 19   CREATININE 1.06* 1.16* 1.02*  CALCIUM 9.6 9.5 9.3   CrCl cannot be calculated (Patient's most recent lab result is older than the maximum 21 days allowed.).     Latest Ref Rng & Units 09/27/2022    2:55 PM 09/09/2022    4:39 PM 06/29/2022   11:33 AM  CMP  Glucose 65 - 99 mg/dL 75  161  98   BUN 7 - 25 mg/dL 19  21  13    Creatinine 0.60 - 0.95 mg/dL 0.96  0.45  4.09   Sodium 135 - 146 mmol/L 143  145  145   Potassium 3.5 - 5.3 mmol/L 5.1  5.2  4.9   Chloride 98 - 110 mmol/L 108  106  108   CO2 20 - 32 mmol/L 27  26  29    Calcium 8.6 - 10.4 mg/dL 9.3  9.5  9.6   Total Protein 6.1 - 8.1 g/dL 6.5  6.3  6.7   Total Bilirubin 0.2 - 1.2 mg/dL 0.3  <8.1  0.4   Alkaline Phos 44 - 121 IU/L  144    AST 10 - 35 U/L 14  15  12    ALT 6 - 29 U/L 8  12  6        Latest Ref Rng & Units 09/09/2022    4:39 PM 06/29/2022   11:33 AM 03/23/2022   12:20 PM  CBC  WBC 3.4 - 10.8 x10E3/uL 9.2  9.1  9.4   Hemoglobin 11.1 - 15.9 g/dL 19.1  47.8  29.5   Hematocrit 34.0 - 46.6 % 37.0  39.0  39.3   Platelets 150 - 450 x10E3/uL 391  449  445    Lipid Panel     Component Value Date/Time   CHOL 151 11/02/2021 1624   TRIG 139 11/02/2021 1624   HDL 58 11/02/2021 1624   CHOLHDL 3.0 02/13/2020 1602   LDLCALC 69 11/02/2021 1624   HEMOGLOBIN A1C Lab Results  Component Value Date   HGBA1C 5.7 (H) 03/23/2022   TSH Lab Results  Component Value Date   TSH 1.720 06/20/2018    Allergies & Medications  Allergies  Allergen Reactions   Codeine Hypertension   Ibuprofen Other (See Comments)    Makes sick on stomach    Medications    Current Outpatient Medications:    acetaminophen (TYLENOL) 325 MG tablet, Take 325 mg by mouth every 6 (six) hours as needed for mild pain., Disp: , Rfl:    albuterol (VENTOLIN HFA) 108 (90 Base) MCG/ACT inhaler, Inhale 2 puffs into the lungs every 4 (four) hours as needed for wheezing or shortness of breath., Disp:  8 g, Rfl: 0   ALPRAZolam (XANAX) 0.25 MG tablet, One tab po qpm prn, Disp: 20 tablet, Rfl: 0   amLODipine (NORVASC) 10 MG tablet, TAKE ONE TABLET BY MOUTH ONCE DAILY., Disp: 90 tablet, Rfl: 0   Apoaequorin (PREVAGEN PO), Take 1 tablet by mouth daily., Disp: , Rfl:    Aspirin 81 MG CAPS, , Disp: , Rfl:    CALCIUM PO, Take 1 tablet by mouth daily. 600 mg, Disp: , Rfl:    carvedilol (COREG) 12.5 MG tablet, TAKE (1) TABLET BY MOUTH TWICE DAILY., Disp: 180 tablet, Rfl: 0   cetirizine (ZYRTEC) 10 MG tablet, Take 10 mg by mouth at bedtime., Disp: , Rfl:    denosumab (PROLIA) 60 MG/ML SOSY injection, Inject 60 mg into the skin every 6 (six) months. 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[address], [address], [place] [address].Appt on 08/03/22, Disp: 1 mL, Rfl: 0   diclofenac Sodium (VOLTAREN) 1 % GEL, Apply 4 g topically 4 (four) times daily., Disp: 150 g, Rfl: 1   ezetimibe (ZETIA) 10 MG tablet, TAKE 1 TABLET BY MOUTH ONCE A DAY., Disp: 90 tablet, Rfl: 3   famotidine (PEPCID) 20 MG tablet, TAKE ONE TABLET BY MOUTH ONCE DAILY., Disp: 30 tablet, Rfl: 0   fluticasone (FLONASE) 50 MCG/ACT nasal spray, Place 2 sprays into both nostrils daily., Disp: 16 g, Rfl: 0   furosemide (LASIX) 20 MG tablet, TAKE 1 TABLET BY MOUTH DAILY, Disp: 90 tablet, Rfl: 0   HYDROcodone-acetaminophen (NORCO/VICODIN) 5-325 MG tablet, Take 1 tablet by mouth every 6 (six) hours as needed for moderate pain., Disp: 20 tablet, Rfl: 0   losartan (COZAAR) 25 MG tablet, Take 1 tablet (25 mg total) by mouth daily., Disp: 90 tablet, Rfl: 2   Multiple Vitamin (MULTIVITAMIN WITH MINERALS) TABS, Take 1 tablet by mouth daily., Disp: , Rfl:    nitroGLYCERIN (NITROSTAT) 0.4 MG SL tablet, Place 1 tablet (0.4 mg total) under the tongue every 5 (five) minutes x 3 doses as needed for chest pain., Disp: 30 tablet, Rfl: 3   NOREL AD 4-10-325 MG TABS, Take 1 tablet by mouth 4 (four) times daily., Disp: , Rfl:    predniSONE (DELTASONE) 20 MG tablet, Take 2 tablets (40  mg total) by mouth daily., Disp: 10 tablet, Rfl: 0   pregabalin (LYRICA) 75 MG capsule, TAKE 1 CAPSULE BY MOUTH THREE TIMES A DAY., Disp: 270 capsule, Rfl: 0   Probiotic Product (PROBIOTIC DAILY PO), Take 1 tablet by mouth daily at 12 noon., Disp: , Rfl:    sertraline (ZOLOFT) 25 MG tablet, TAKE 1 TABLET BY MOUTH DAILY., Disp: 90 tablet, Rfl: 2   tiZANidine (ZANAFLEX) 4 MG tablet, TAKE 1 TABLET BY MOUTH ONCE AT BEDTIME AS NEEDED FOR MUSCLE SPASMS., Disp: 30 tablet, Rfl: 0   triamcinolone cream (KENALOG) 0.1 %, Apply 1 Application topically 2 (two) times daily. (Patient taking differently: Apply 1 Application topically as needed.), Disp: 30 g, Rfl: 0   pravastatin (PRAVACHOL) 40 MG  tablet, Take 1 tablet (40 mg total) by mouth every evening., Disp: 90 tablet, Rfl: 3   Radiology:  No results found.   Cardiac Studies:   Sleep Study  [2011]: Negative for sleep apnea  Coronary Angiogram   01/16/18: Patent Sypher stent in Cx, 2.75 x 15 mm DES placed on 01/22/08. Normal LVEF. NO other significant CAD.  Echocardiogram 08/28/2020: Left ventricle cavity is normal in size. Severe concentric hypertrophy of the left ventricle. Normal global wall motion. Normal LV systolic function with visual EF 55-60%. Doppler evidence of grade I (impaired) diastolic dysfunction, normal LAP. Left atrial cavity is mildly dilated. Trileaflet aortic valve with mild calcification.  Mild aortic stenosis. Vmax 2.1 m/sec, mean PG 10 mmHg, AVA 1.6 cm2 by continuity equation. Moderate (Grade II) aortic regurgitation. Moderate (Grade II) mitral regurgitation. Mildly restricted mitral valve leaflets. Moderate tricuspid regurgitation. Estimated pulmonary artery systolic pressure 39 mmHg. No significant change compared to previous study in 2020.  Carotid artery duplex 04/13/2022: Duplex suggests stenosis in the right internal carotid artery (16-49%). <50% stenosis in the right external carotid artery. Duplex suggests stenosis in the  left internal carotid artery (1-15%). <50% stenosis in the left common carotid artery and left external carotid artery. Antegrade right vertebral artery flow.  Antegrade left vertebral artery flow.  Compared to the study done on 03/30/2021, mild titration of left ICA stenosis.  Otherwise no significant change. Follow up in one year is appropriate if clinically indicated.  EKG   EKG 11/02/2022: Normal sinus rhythm at the rate of 66 bpm, LAE, otherwise normal EKG.  No significant change from 03/23/2022.  Assessment     ICD-10-CM   1. Coronary artery disease of native artery of native heart with stable angina pectoris (HCC)  I25.118 EKG 12-Lead    2. Essential hypertension  I10     3. Carotid stenosis, asymptomatic, bilateral  I65.23 PCV CAROTID DUPLEX (BILATERAL)    4. Mixed hyperlipidemia  E78.2       No orders of the defined types were placed in this encounter.   There are no discontinued medications.   Recommendations:    DOREATHER VALLADAREZ  is a 82 y.o. 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 Coronary Angiography on 01/16/18 that showed no obstructive CAD and patent stents from 2009.   1. Coronary artery disease of native artery of native heart with stable angina pectoris [facility]) She is currently doing well and has not had any recurrence of angina pectoris.  Her main concern is back pain and sciatica.  - EKG 12-Lead  2. Essential hypertension Blood pressure is well-controlled, continue losartan and amlodipine.  Continue the same.  3. Carotid stenosis, asymptomatic, bilateral Reviewed her carotid artery duplex, or time with control of her lipids, carotid stenosis is improved she now has very mild stenosis in the right ICA.  Will recheck in a year. - PCV CAROTID DUPLEX (BILATERAL); Future  4. Mixed hyperlipidemia Reviewed her external labs, tolerating pravastatin and Zetia,  she did not tolerate Crestor or Lipitor.  Lipids are at goal.  I will see her back in a year or sooner if problems.   Yates Decamp, MD, [facility] 11/05/2022, 9:30 PM Office: [phone] Fax: [phone] Pager: [phone]

## 2022-11-03 ENCOUNTER — Ambulatory Visit: Payer: Medicare HMO | Admitting: Internal Medicine

## 2022-11-03 ENCOUNTER — Ambulatory Visit: Payer: Medicare HMO | Admitting: Cardiology

## 2022-11-07 ENCOUNTER — Ambulatory Visit: Payer: Self-pay

## 2022-11-07 NOTE — Patient Outreach (Signed)
Care Coordination   11/07/2022 Name: Kathryn [name] MRN: [id number] DOB: 05-01-1940   Care Coordination Outreach Attempts:  An unsuccessful telephone outreach was attempted for a scheduled appointment today.  Follow Up Plan:  Additional outreach attempts will be made to offer the patient care coordination information and services.   Encounter Outcome:  No Answer   Care Coordination Interventions:  No, not indicated    Delsa Sale RN BSN CCM [place]  [facility] Nurse Care Coordinator  Direct Dial: [phone] Website: [email or web address]

## 2022-11-23 DIAGNOSIS — H524 Presbyopia: Secondary | ICD-10-CM | POA: Diagnosis not present

## 2022-11-23 DIAGNOSIS — H16223 Keratoconjunctivitis sicca, not specified as Sjogren's, bilateral: Secondary | ICD-10-CM | POA: Diagnosis not present

## 2022-11-23 DIAGNOSIS — H01001 Unspecified blepharitis right upper eyelid: Secondary | ICD-10-CM | POA: Diagnosis not present

## 2022-11-23 DIAGNOSIS — H01002 Unspecified blepharitis right lower eyelid: Secondary | ICD-10-CM | POA: Diagnosis not present

## 2022-11-29 ENCOUNTER — Ambulatory Visit: Payer: Self-pay

## 2022-11-30 NOTE — Patient Instructions (Signed)
Visit Information  Thank you for taking time to visit with me today. Please don't hesitate to contact me if I can be of assistance to you.   Following are the goals we discussed today:   Goals Addressed             This Visit's Progress    To have arthritis pain and back pain with Sciatica evaluated by orthopedics   On track    Care Coordination Interventions: Reviewed provider established plan for pain management Determined patient continues to follow up with her Orthopedic MD and Rheumatologist for management of her chronic low back pain with sciatica and 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  Reviewed medications with patient and discussed importance of medication adherence Determined patient is using her walker for indoors, she has a scooter for outdoor use when needed Assessed for falls since last nurse contact, patient denies Educated patient on the importance of adhering to her HEP for ongoing symptom management  Instructed patient to keep her doctor well informed of new symptoms or concerns and any/all falls that may occur           Our next appointment is by telephone on 01/30/23 at 11:30 AM  Please call the care guide team at [phone] if you need to cancel or reschedule your appointment.   If you are experiencing a Mental Health or Behavioral Health Crisis or need someone to talk to, please call 1-800-273-TALK (toll free, 24 hour hotline)  The patient verbalized understanding of instructions, educational materials, and care plan provided today and DECLINED offer to receive copy of patient instructions, educational materials, and care plan.

## 2022-11-30 NOTE — Patient Outreach (Signed)
Care Coordination   Follow Up Visit Note   11/30/2022 Name: LOGANNE VERES MRN: [id number] DOB: April 05, 1940  [name] is a 82 y.o. year old female who sees [name], MD for primary care. I spoke with  [name] by phone today.  What matters to the patients health and wellness today?  Patient would like to continue to manage her sciatica pain in order to stay mobile and independent.     Goals Addressed             This Visit's Progress    To have arthritis pain and back pain with Sciatica evaluated by orthopedics   On track    Care Coordination Interventions: Reviewed provider established plan for pain management Determined patient continues to follow up with her Orthopedic MD and Rheumatologist for management of her chronic low back pain with sciatica and 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  Reviewed medications with patient and discussed importance of medication adherence Determined patient is using her walker for indoors, she has a scooter for outdoor use when needed Assessed for falls since last nurse contact, patient denies Educated patient on the importance of adhering to her HEP for ongoing symptom management  Instructed patient to keep her doctor well informed of new symptoms or concerns and any/all falls that may occur       Interventions Today    Flowsheet Row Most Recent Value  Chronic Disease   Chronic disease during today's visit Other, Hypertension (HTN)  [Sciatica and Osteoarthritis]  General Interventions   General Interventions Discussed/Reviewed General Interventions Discussed, General Interventions Reviewed, Doctor Visits, Durable Medical Equipment (DME), Labs  Labs Kidney Function  Doctor Visits Discussed/Reviewed Doctor Visits Discussed, Doctor Visits Reviewed, PCP, Specialist  Durable Medical Equipment (DME) Other, Wheelchair  [Cane]  Wheelchair Motorized  Exercise Interventions   Exercise Discussed/Reviewed Physical  Activity, Exercise Reviewed, Exercise Discussed  Physical Activity Discussed/Reviewed Physical Activity Discussed, Physical Activity Reviewed, Types of exercise, Home Exercise Program (HEP)  Education Interventions   Education Provided Provided Education  Provided Verbal Education On When to see the doctor, Exercise, Nutrition  Nutrition Interventions   Nutrition Discussed/Reviewed Nutrition Discussed, Nutrition Reviewed, Fluid intake  Pharmacy Interventions   Pharmacy Dicussed/Reviewed Pharmacy Topics Discussed, Pharmacy Topics Reviewed, Medications and their functions  Safety Interventions   Safety Discussed/Reviewed Fall Risk, Home Safety, Safety Reviewed, Safety Discussed  Home Safety Assistive Devices          SDOH assessments and interventions completed:  No     Care Coordination Interventions:  Yes, provided   Follow up plan: Follow up call scheduled for 01/30/23 @11 :30 AM    Encounter Outcome:  Patient Visit Completed

## 2022-12-08 ENCOUNTER — Other Ambulatory Visit: Payer: Self-pay | Admitting: Internal Medicine

## 2022-12-08 DIAGNOSIS — I1310 Hypertensive heart and chronic kidney disease without heart failure, with stage 1 through stage 4 chronic kidney disease, or unspecified chronic kidney disease: Secondary | ICD-10-CM

## 2022-12-12 ENCOUNTER — Encounter: Payer: Self-pay | Admitting: Internal Medicine

## 2022-12-12 ENCOUNTER — Ambulatory Visit (INDEPENDENT_AMBULATORY_CARE_PROVIDER_SITE_OTHER): Payer: Medicare HMO | Admitting: Internal Medicine

## 2022-12-12 VITALS — BP 120/80 | HR 79 | Temp 98.7°F | Ht 63.0 in | Wt 199.0 lb

## 2022-12-12 DIAGNOSIS — I1310 Hypertensive heart and chronic kidney disease without heart failure, with stage 1 through stage 4 chronic kidney disease, or unspecified chronic kidney disease: Secondary | ICD-10-CM

## 2022-12-12 DIAGNOSIS — E6609 Other obesity due to excess calories: Secondary | ICD-10-CM | POA: Insufficient documentation

## 2022-12-12 DIAGNOSIS — J309 Allergic rhinitis, unspecified: Secondary | ICD-10-CM | POA: Diagnosis not present

## 2022-12-12 DIAGNOSIS — Z23 Encounter for immunization: Secondary | ICD-10-CM | POA: Diagnosis not present

## 2022-12-12 DIAGNOSIS — Z6835 Body mass index (BMI) 35.0-35.9, adult: Secondary | ICD-10-CM | POA: Diagnosis not present

## 2022-12-12 DIAGNOSIS — R0982 Postnasal drip: Secondary | ICD-10-CM | POA: Diagnosis not present

## 2022-12-12 DIAGNOSIS — R7303 Prediabetes: Secondary | ICD-10-CM

## 2022-12-12 DIAGNOSIS — E66811 Obesity, class 1: Secondary | ICD-10-CM | POA: Insufficient documentation

## 2022-12-12 DIAGNOSIS — N1831 Chronic kidney disease, stage 3a: Secondary | ICD-10-CM | POA: Diagnosis not present

## 2022-12-12 DIAGNOSIS — J209 Acute bronchitis, unspecified: Secondary | ICD-10-CM | POA: Insufficient documentation

## 2022-12-12 DIAGNOSIS — E66812 Obesity, class 2: Secondary | ICD-10-CM | POA: Insufficient documentation

## 2022-12-12 NOTE — Assessment & Plan Note (Signed)
 Chronic, she is encouraged to stay well hydrated, avoid NSAIDs and keep BP controlled to prevent progression of CKD.

## 2022-12-12 NOTE — Assessment & Plan Note (Addendum)
She was congratulated on her weight loss.  She states she is now following a low carb diet. She is following an Firefighter on social media - she states she has been exercising as well.  She is encouraged to strive for BMI less than 30 to decrease cardiac risk. Advised to aim for at least 150 minutes of exercise per week.

## 2022-12-12 NOTE — Assessment & Plan Note (Signed)
Previous labs reviewed, her A1c has been elevated in the past. I will check an A1c today. Reminded to avoid refined sugars including sugary drinks/foods and processed meats including bacon, sausages and deli meats.  

## 2022-12-12 NOTE — Assessment & Plan Note (Signed)
She was given Tdap, approved by [employer] .

## 2022-12-12 NOTE — Progress Notes (Signed)
I,Jameka J Llittleton, CMA,acting as a Neurosurgeon for [name], MD.,have documented all relevant documentation on the behalf of [name], MD,as directed by  [name], MD while in the presence of [name], MD.  Subjective:  Patient ID: Kathryn [name] , female    DOB: 01-16-1941 , 82 y.o.   MRN: [id number]  Chief Complaint  Patient presents with   Hypertension    HPI  Patient presents today for bp and prediabetes check. Patient reports compliance with her meds. She denies headaches, chest pain and shortness of breath. Patient complains of a productive cough she reports having a little bit sinus pain and pressure. She reports she has been coughing so much that her upper back hurts. She reported the cough started last week.    Hypertension This is a chronic problem. The current episode started more than 1 year ago. The problem has been gradually improving since onset. The problem is controlled. Pertinent negatives include no blurred vision, chest pain, palpitations or shortness of breath. Risk factors for coronary artery disease include dyslipidemia, obesity, post-menopausal state and sedentary lifestyle. The current treatment provides moderate improvement. Compliance problems include exercise.  Hypertensive end-organ damage includes kidney disease and CAD/MI.     Past Medical History:  Diagnosis Date   Anxiety    takes Xanax bid prn   Arthritis    Bruises easily    pt is on Plavix   Chronic back pain    buldging disc   Chronic neck pain    Coronary artery disease    1 stent    Dysphagia    Headache(784.0)    related to cervical issues   History of colonic polyps    Hyperlipidemia    takes Crestor daily   Hypertension    takes Coreg and Diovan daily   Myocardial infarction (HCC) 2019   Osteoporosis    was taking shot 2xyr;but not taking anymore   Peripheral vascular disease (HCC)    Seasonal allergies    takes Zyrtec nightly     Family History   Problem Relation Age of Onset   Heart disease Mother        Heart Dissease before age 107   Heart attack Mother    Cancer Father    Leukemia Sister    Cancer Sister    Lupus Sister    Cancer Brother    Heart disease Brother        Amputation   Heart attack Brother    Heart disease Brother    Heart attack Brother    Dementia Other    Healthy Son    Healthy Daughter    Healthy Daughter    Healthy Daughter    Anesthesia problems Neg Hx    Hypotension Neg Hx    Malignant hyperthermia Neg Hx    Pseudochol deficiency Neg Hx      Current Outpatient Medications:    acetaminophen (TYLENOL) 325 MG tablet, Take 325 mg by mouth every 6 (six) hours as needed for mild pain., Disp: , Rfl:    amLODipine (NORVASC) 10 MG tablet, TAKE ONE TABLET BY MOUTH ONCE DAILY., Disp: 90 tablet, Rfl: 0   Apoaequorin (PREVAGEN PO), Take 1 tablet by mouth daily., Disp: , Rfl:    Aspirin 81 MG CAPS, , Disp: , Rfl:    CALCIUM PO, Take 1 tablet by mouth daily. 600 mg, Disp: , Rfl:    carvedilol (COREG) 12.5 MG tablet, TAKE (1) TABLET BY  MOUTH TWICE DAILY., Disp: 180 tablet, Rfl: 0   cetirizine (ZYRTEC) 10 MG tablet, Take 10 mg by mouth at bedtime., Disp: , Rfl:    denosumab (PROLIA) 60 MG/ML SOSY injection, Inject 60 mg into the skin every 6 (six) months. 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, [place] [address].Appt on 08/03/22, Disp: 1 mL, Rfl: 0   diclofenac Sodium (VOLTAREN) 1 % GEL, Apply 4 g topically 4 (four) times daily., Disp: 150 g, Rfl: 1   ezetimibe (ZETIA) 10 MG tablet, TAKE 1 TABLET BY MOUTH ONCE A DAY., Disp: 90 tablet, Rfl: 3   famotidine (PEPCID) 20 MG tablet, TAKE ONE TABLET BY MOUTH ONCE DAILY., Disp: 30 tablet, Rfl: 0   losartan (COZAAR) 25 MG tablet, Take 1 tablet (25 mg total) by mouth daily., Disp: 90 tablet, Rfl: 2   Multiple Vitamin (MULTIVITAMIN WITH MINERALS) TABS, Take 1 tablet by mouth daily., Disp: , Rfl:    nitroGLYCERIN (NITROSTAT) 0.4 MG SL tablet, Place 1 tablet (0.4 mg  total) under the tongue every 5 (five) minutes x 3 doses as needed for chest pain., Disp: 30 tablet, Rfl: 3   pravastatin (PRAVACHOL) 40 MG tablet, Take 1 tablet (40 mg total) by mouth every evening., Disp: 90 tablet, Rfl: 3   pregabalin (LYRICA) 75 MG capsule, TAKE 1 CAPSULE BY MOUTH THREE TIMES A DAY., Disp: 270 capsule, Rfl: 0   Probiotic Product (PROBIOTIC DAILY PO), Take 1 tablet by mouth daily at 12 noon., Disp: , Rfl:    sertraline (ZOLOFT) 25 MG tablet, TAKE 1 TABLET BY MOUTH DAILY., Disp: 90 tablet, Rfl: 2   tiZANidine (ZANAFLEX) 4 MG tablet, TAKE 1 TABLET BY MOUTH ONCE AT BEDTIME AS NEEDED FOR MUSCLE SPASMS., Disp: 30 tablet, Rfl: 0   triamcinolone cream (KENALOG) 0.1 %, Apply 1 Application topically 2 (two) times daily. (Patient taking differently: Apply 1 Application topically as needed.), Disp: 30 g, Rfl: 0   albuterol (VENTOLIN HFA) 108 (90 Base) MCG/ACT inhaler, Inhale 2 puffs into the lungs every 4 (four) hours as needed for wheezing or shortness of breath. (Patient not taking: Reported on 12/12/2022), Disp: 8 g, Rfl: 0   ALPRAZolam (XANAX) 0.25 MG tablet, One tab po qpm prn (Patient not taking: Reported on 12/12/2022), Disp: 20 tablet, Rfl: 0   fluticasone (FLONASE) 50 MCG/ACT nasal spray, Place 2 sprays into both nostrils daily. (Patient not taking: Reported on 12/12/2022), Disp: 16 g, Rfl: 0   furosemide (LASIX) 20 MG tablet, TAKE 1 TABLET BY MOUTH DAILY (Patient not taking: Reported on 12/12/2022), Disp: 90 tablet, Rfl: 0   Allergies  Allergen Reactions   Codeine Hypertension   Ibuprofen Other (See Comments)    Makes sick on stomach     Review of Systems  Constitutional: Negative.   HENT: Negative.    Eyes: Negative.  Negative for blurred vision.  Respiratory:  Positive for cough. Negative for shortness of breath.   Cardiovascular: Negative.  Negative for chest pain and palpitations.  Gastrointestinal: Negative.   Skin: Negative.   Psychiatric/Behavioral: Negative.        Today's Vitals   12/12/22 1222  BP: 120/80  Pulse: 79  Temp: 98.7 F (37.1 C)  Weight: 199 lb (90.3 kg)  Height: 5\' 3"  (1.6 m)  PainSc: 0-No pain   Body mass index is 35.25 kg/m.  Wt Readings from Last 3 Encounters:  12/12/22 199 lb (90.3 kg)  11/02/22 208 lb 6.4 oz (94.5 kg)  09/27/22 223 lb 9.6 oz (101.4  kg)    The ASCVD Risk score (Arnett DK, et al., 2019) failed to calculate for the following reasons:   The 2019 ASCVD risk score is only valid for ages 26 to 53   The patient has a prior MI or stroke diagnosis  Objective:  Physical Exam Vitals and nursing note reviewed.  Constitutional:      Appearance: Normal appearance.  HENT:     Head: Normocephalic and atraumatic.  Eyes:     Extraocular Movements: Extraocular movements intact.  Cardiovascular:     Rate and Rhythm: Normal rate and regular rhythm.     Heart sounds: Murmur heard.  Pulmonary:     Effort: Pulmonary effort is normal.     Breath sounds: Normal breath sounds.  Musculoskeletal:     Cervical back: Normal range of motion.     Comments: Ambulatory with cane  Skin:    General: Skin is warm.  Neurological:     General: No focal deficit present.     Mental Status: She is alert.  Psychiatric:        Mood and Affect: Mood normal.        Behavior: Behavior normal.         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 NO med changes. Chronic, she is encouraged to stay well hydrated, avoid NSAIDs and keep BP controlled to prevent progression of CKD.  She will f/u in four months for re-evaluation.   Orders: -     Lipid panel -     TSH  Allergic rhinitis with postnasal drip Assessment & Plan: Chronic, she is having an acute flare. She will continue with Zyrtec nightly. She was given samples of Coricidin to take once daily in the mornings. She agrees to call me in 48-72 hours  to let me know how she is doing.    Prediabetes Assessment & Plan: Previous labs reviewed, her A1c has been elevated in the past. I will check an A1c today. Reminded to avoid refined sugars including sugary drinks/foods and processed meats including bacon, sausages and deli meats.    Orders: -     Lipid panel -     Hemoglobin A1c -     TSH  Class 2 severe obesity due to excess calories with serious comorbidity and body mass index (BMI) of 35.0 to 35.9 in adult [facility]) Assessment & Plan: She was congratulated on her weight loss.  She states she is now following a low carb diet. She is following an Firefighter on social media - she states she has been exercising as well.  She is encouraged to strive for BMI less than 30 to decrease cardiac risk. Advised to aim for at least 150 minutes of exercise per week.    Immunization due Assessment & Plan: She was given Tdap, approved by [employer] .  Orders: -     Tdap vaccine greater than or equal to 7yo IM    Return in 2 weeks (on 12/26/2022), or on a Friday-flu/covid, for nurse visit.  Patient was given opportunity to ask questions. Patient verbalized understanding of the plan and was able to repeat key elements of the plan. All questions were answered to their satisfaction.    I, [name], MD, have reviewed all documentation for this visit. The documentation on 12/12/22 for the exam, diagnosis, procedures, and orders are all accurate and complete.  IF YOU HAVE BEEN REFERRED TO A SPECIALIST, IT MAY TAKE 1-2 WEEKS TO SCHEDULE/PROCESS THE REFERRAL. IF YOU HAVE NOT HEARD FROM US/SPECIALIST IN TWO WEEKS, PLEASE GIVE [place] A CALL AT [phone].

## 2022-12-12 NOTE — Assessment & Plan Note (Addendum)
Chronic, she is having an acute flare. She will continue with Zyrtec nightly. She was given samples of Coricidin to take once daily in the mornings. She agrees to call me in 48-72 hours to let me know how she is doing.

## 2022-12-12 NOTE — Assessment & Plan Note (Addendum)
Chronic, well controlled. NO med changes. Chronic, she is encouraged to stay well hydrated, avoid NSAIDs and keep BP controlled to prevent progression of CKD.  She will f/u in four months for re-evaluation.

## 2022-12-13 LAB — LIPID PANEL
Chol/HDL Ratio: 3.8 ratio (ref 0.0–4.4)
Cholesterol, Total: 172 mg/dL (ref 100–199)
HDL: 45 mg/dL (ref >39)
LDL Chol Calc (NIH): 108 mg/dL — ABNORMAL HIGH (ref 0–99)
Triglycerides: 103 mg/dL (ref 0–149)
VLDL Cholesterol Cal: 19 mg/dL (ref 5–40)

## 2022-12-13 LAB — HEMOGLOBIN A1C
Est. average glucose Bld gHb Est-mCnc: 123 mg/dL
Hgb A1c MFr Bld: 5.9 % — ABNORMAL HIGH (ref 4.8–5.6)

## 2022-12-13 LAB — TSH: TSH: 1.19 u[IU]/mL (ref 0.450–4.500)

## 2022-12-14 ENCOUNTER — Other Ambulatory Visit: Payer: Self-pay | Admitting: Internal Medicine

## 2022-12-14 MED ORDER — DOXYCYCLINE HYCLATE 100 MG PO TABS: 100.0000 mg | ORAL_TABLET | Freq: Two times a day (BID) | ORAL | 0 refills | Status: DC

## 2022-12-22 ENCOUNTER — Other Ambulatory Visit: Payer: Self-pay | Admitting: Internal Medicine

## 2022-12-22 DIAGNOSIS — F4321 Adjustment disorder with depressed mood: Secondary | ICD-10-CM

## 2022-12-22 MED ORDER — ALPRAZOLAM 0.25 MG PO TABS
ORAL_TABLET | ORAL | 0 refills | Status: DC
Start: 2022-12-22 — End: 2023-01-25

## 2022-12-27 ENCOUNTER — Ambulatory Visit: Payer: Medicare HMO

## 2022-12-28 ENCOUNTER — Encounter: Payer: Self-pay | Admitting: Internal Medicine

## 2022-12-28 ENCOUNTER — Ambulatory Visit: Payer: Medicare HMO | Admitting: Internal Medicine

## 2022-12-28 VITALS — BP 130/74 | HR 80 | Temp 98.3°F | Ht 63.0 in | Wt 200.8 lb

## 2022-12-28 DIAGNOSIS — R21 Rash and other nonspecific skin eruption: Secondary | ICD-10-CM | POA: Insufficient documentation

## 2022-12-28 DIAGNOSIS — B029 Zoster without complications: Secondary | ICD-10-CM | POA: Insufficient documentation

## 2022-12-28 DIAGNOSIS — F4321 Adjustment disorder with depressed mood: Secondary | ICD-10-CM

## 2022-12-28 NOTE — Patient Instructions (Signed)

## 2022-12-28 NOTE — Progress Notes (Signed)
I,Kathryn [name], CMA,acting as a Neurosurgeon for Kathryn Aliment, MD.,have documented all relevant documentation on the behalf of Kathryn Aliment, MD,as directed by  Kathryn Aliment, MD while in the presence of Kathryn Aliment, MD.  Subjective:  Patient ID: Kathryn [name] , female    DOB: Sep 13, 1940 , 82 y.o.   MRN: [id number]  Chief Complaint  Patient presents with   Rash    HPI  Patient presents today for evaluation of possible shingles outbreak. She was brought by her daughter who is concerned about the rash. Her sx started a week ago Tuesday. She does admit to being stressed, she recently  lost her husband.    She states the upper part of her back is where the outbreak happened.  Today, she is experiencing soreness.        Past Medical History:  Diagnosis Date   Anxiety    takes Xanax bid prn   Arthritis    Bruises easily    pt is on Plavix   Chronic back pain    buldging disc   Chronic neck pain    Coronary artery disease    1 stent    Dysphagia    Headache(784.0)    related to cervical issues   History of colonic polyps    Hyperlipidemia    takes Crestor daily   Hypertension    takes Coreg and Diovan daily   Myocardial infarction (HCC) 2019   Osteoporosis    was taking shot 2xyr;but not taking anymore   Peripheral vascular disease (HCC)    Seasonal allergies    takes Zyrtec nightly     Family History  Problem Relation Age of Onset   Heart disease Mother        Heart Dissease before age 23   Heart attack Mother    Cancer Father    Leukemia Sister    Cancer Sister    Lupus Sister    Cancer Brother    Heart disease Brother        Amputation   Heart attack Brother    Heart disease Brother    Heart attack Brother    Dementia Other    Healthy Son    Healthy Daughter    Healthy Daughter    Healthy Daughter    Anesthesia problems Neg Hx    Hypotension Neg Hx    Malignant hyperthermia Neg Hx    Pseudochol deficiency Neg Hx      Current  Outpatient Medications:    acetaminophen (TYLENOL) 325 MG tablet, Take 325 mg by mouth every 6 (six) hours as needed for mild pain., Disp: , Rfl:    ALPRAZolam (XANAX) 0.25 MG tablet, One tab po qpm prn, Disp: 20 tablet, Rfl: 0   amLODipine (NORVASC) 10 MG tablet, TAKE ONE TABLET BY MOUTH ONCE DAILY., Disp: 90 tablet, Rfl: 0   Apoaequorin (PREVAGEN PO), Take 1 tablet by mouth daily., Disp: , Rfl:    Aspirin 81 MG CAPS, , Disp: , Rfl:    CALCIUM PO, Take 1 tablet by mouth daily. 600 mg, Disp: , Rfl:    carvedilol (COREG) 12.5 MG tablet, TAKE (1) TABLET BY MOUTH TWICE DAILY., Disp: 180 tablet, Rfl: 0   cetirizine (ZYRTEC) 10 MG tablet, Take 10 mg by mouth at bedtime., Disp: , Rfl:    denosumab (PROLIA) 60 MG/ML SOSY injection, Inject 60 mg into the skin every 6 (six) months. 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[address], [address], [address]  [place] [address].Appt on 08/03/22, Disp: 1 mL, Rfl: 0   diclofenac Sodium (VOLTAREN) 1 % GEL, Apply 4 g topically 4 (four) times daily., Disp: 150 g, Rfl: 1   doxycycline (VIBRA-TABS) 100 MG tablet, Take 1 tablet (100 mg total) by mouth 2 (two) times daily., Disp: 14 tablet, Rfl: 0   ezetimibe (ZETIA) 10 MG tablet, TAKE 1 TABLET BY MOUTH ONCE A DAY., Disp: 90 tablet, Rfl: 3   famotidine (PEPCID) 20 MG tablet, TAKE ONE TABLET BY MOUTH ONCE DAILY., Disp: 30 tablet, Rfl: 0   losartan (COZAAR) 25 MG tablet, Take 1 tablet (25 mg total) by mouth daily., Disp: 90 tablet, Rfl: 2   Multiple Vitamin (MULTIVITAMIN WITH MINERALS) TABS, Take 1 tablet by mouth daily., Disp: , Rfl:    nitroGLYCERIN (NITROSTAT) 0.4 MG SL tablet, Place 1 tablet (0.4 mg total) under the tongue every 5 (five) minutes x 3 doses as needed for chest pain., Disp: 30 tablet, Rfl: 3   pregabalin (LYRICA) 75 MG capsule, TAKE 1 CAPSULE BY MOUTH THREE TIMES A DAY., Disp: 270 capsule, Rfl: 0   Probiotic Product (PROBIOTIC DAILY PO), Take 1 tablet by mouth daily at 12 noon., Disp: , Rfl:    sertraline (ZOLOFT) 25 MG tablet,  TAKE 1 TABLET BY MOUTH DAILY., Disp: 90 tablet, Rfl: 2   tiZANidine (ZANAFLEX) 4 MG tablet, TAKE 1 TABLET BY MOUTH ONCE AT BEDTIME AS NEEDED FOR MUSCLE SPASMS., Disp: 30 tablet, Rfl: 0   triamcinolone cream (KENALOG) 0.1 %, Apply 1 Application topically 2 (two) times daily. (Patient taking differently: Apply 1 Application topically as needed.), Disp: 30 g, Rfl: 0   albuterol (VENTOLIN HFA) 108 (90 Base) MCG/ACT inhaler, Inhale 2 puffs into the lungs every 4 (four) hours as needed for wheezing or shortness of breath. (Patient not taking: Reported on 12/12/2022), Disp: 8 g, Rfl: 0   fluticasone (FLONASE) 50 MCG/ACT nasal spray, Place 2 sprays into both nostrils daily. (Patient not taking: Reported on 12/12/2022), Disp: 16 g, Rfl: 0   furosemide (LASIX) 20 MG tablet, TAKE 1 TABLET BY MOUTH DAILY (Patient not taking: Reported on 12/12/2022), Disp: 90 tablet, Rfl: 0   pravastatin (PRAVACHOL) 40 MG tablet, Take 1 tablet (40 mg total) by mouth every evening., Disp: 90 tablet, Rfl: 3   Allergies  Allergen Reactions   Codeine Hypertension   Ibuprofen Other (See Comments)    Makes sick on stomach     Review of Systems  Constitutional: Negative.   Respiratory: Negative.    Cardiovascular: Negative.   Skin:  Positive for rash.  Neurological: Negative.   Psychiatric/Behavioral: Negative.       Today's Vitals   12/28/22 1424  BP: 130/74  Pulse: 80  Temp: 98.3 F (36.8 C)  SpO2: 98%  Weight: 200 lb 12.8 oz (91.1 kg)  Height: 5\' 3"  (1.6 m)   Body mass index is 35.57 kg/m.  Wt Readings from Last 3 Encounters:  12/28/22 200 lb 12.8 oz (91.1 kg)  12/12/22 199 lb (90.3 kg)  11/02/22 208 lb 6.4 oz (94.5 kg)     Objective:  Physical Exam Vitals and nursing note reviewed.  Constitutional:      Appearance: Normal appearance.  HENT:     Head: Normocephalic and atraumatic.  Eyes:     Extraocular Movements: Extraocular movements intact.  Cardiovascular:     Rate and Rhythm: Normal rate and  regular rhythm.     Heart sounds: Normal heart sounds.  Pulmonary:     Effort:  Pulmonary effort is normal.     Breath sounds: Normal breath sounds.  Skin:    General: Skin is warm.     Findings: Rash present.     Comments: Flesh colored macules on right upr back, no vesicular lesions noted.   Neurological:     General: No focal deficit present.     Mental Status: She is alert.  Psychiatric:        Mood and Affect: Mood normal.        Behavior: Behavior normal.         Assessment And Plan:  Rash Assessment & Plan: The rash has resolved, no vesicular lesions noted.  If this was shingles, it has since resolved. No treatment is needed.    Grief Assessment & Plan: She does not wish to seek grief counseling at this time. She will let me know if she changes her mind.    She is encouraged to strive for BMI less than 30 to decrease cardiac risk. Advised to aim for at least 150 minutes of exercise per week.    Return if symptoms worsen or fail to improve.  Patient was given opportunity to ask questions. Patient verbalized understanding of the plan and was able to repeat key elements of the plan. All questions were answered to their satisfaction.    I, Kathryn Aliment, MD, have reviewed all documentation for this visit. The documentation on 01/01/23 for the exam, diagnosis, procedures, and orders are all accurate and complete.   IF YOU HAVE BEEN REFERRED TO A SPECIALIST, IT MAY TAKE 1-2 WEEKS TO SCHEDULE/PROCESS THE REFERRAL. IF YOU HAVE NOT HEARD FROM US/SPECIALIST IN TWO WEEKS, PLEASE GIVE [place] A CALL AT [phone].   THE PATIENT IS ENCOURAGED TO PRACTICE SOCIAL DISTANCING DUE TO THE COVID-19 PANDEMIC.

## 2023-01-01 NOTE — Assessment & Plan Note (Signed)
She does not wish to seek grief counseling at this time. She will let me know if she changes her mind.

## 2023-01-01 NOTE — Assessment & Plan Note (Signed)
The rash has resolved, no vesicular lesions noted.  If this was shingles, it has since resolved. No treatment is needed.

## 2023-01-05 ENCOUNTER — Other Ambulatory Visit: Payer: Self-pay

## 2023-01-05 DIAGNOSIS — E7800 Pure hypercholesterolemia, unspecified: Secondary | ICD-10-CM

## 2023-01-05 MED ORDER — PRAVASTATIN SODIUM 40 MG PO TABS: 40.0000 mg | ORAL_TABLET | Freq: Every evening | ORAL | 3 refills | Status: DC

## 2023-01-25 ENCOUNTER — Other Ambulatory Visit: Payer: Self-pay | Admitting: Internal Medicine

## 2023-01-25 DIAGNOSIS — F4321 Adjustment disorder with depressed mood: Secondary | ICD-10-CM

## 2023-01-25 MED ORDER — ALPRAZOLAM 0.25 MG PO TABS
ORAL_TABLET | ORAL | 0 refills | Status: DC
Start: 2023-01-25 — End: 2023-02-06

## 2023-01-30 ENCOUNTER — Ambulatory Visit: Payer: Self-pay

## 2023-01-30 NOTE — Patient Outreach (Signed)
Care Coordination   Follow Up Visit Note   01/30/2023 Name: Kathryn [name] MRN: [id number] DOB: October 25, 1940  Kathryn [name] is a 82 y.o. year old female who sees Kathryn Peng, MD for primary care. I spoke with  Kathryn [name] by phone today.  What matters to the patients health and wellness today?  Patient would like to lower her LDL Cholesterol by taking her medication as prescribed. Patient would like to ask her Rheumatologist about ongoing arthritis pain.     Goals Addressed             This Visit's Progress    To consider grief counseling   On track    Care Coordination Interventions: Evaluation of current treatment plan related to grief and patient's adherence to plan as established by provider Discussed with patient the recent loss of her husband of 59 years Active listening / Reflection utilized  Emotional Support Provided Reiterated the availability of LCSW through [facility] as well as outside help through [facility] Instructed patient to keep her doctor well informed on new symptoms or concerns      To have arthritis pain and back pain with Sciatica evaluated by orthopedics   On track    Care Coordination Interventions: Reviewed provider established plan for pain management Determined patient continues to follow up with her Orthopedic MD and Rheumatologist for management of her chronic low back pain with sciatica and 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  Educated patient on the importance of adhering to her HEP for ongoing symptom management  Counseled on the importance of reporting any/all new or changed pain symptoms or management strategies to pain management provider Advised patient to report to care team affect of pain on daily activities Reviewed with patient prescribed pharmacological and nonpharmacological pain relief strategies      To reduce LDL Cholesterol level to provider established goal       Care Coordination  Interventions: Provider established cholesterol goals reviewed Counseled on importance of regular laboratory monitoring as prescribed Reviewed role and benefits of statin for ASCVD risk reduction Reviewed importance of limiting foods high in cholesterol Reviewed exercise goals and target of 150 minutes per week Lipid Panel     Component Value Date/Time   CHOL 172 12/12/2022 1303   TRIG 103 12/12/2022 1303   HDL 45 12/12/2022 1303   CHOLHDL 3.8 12/12/2022 1303   LDLCALC 108 (H) 12/12/2022 1303   LABVLDL 19 12/12/2022 1303       Interventions Today    Flowsheet Row Most Recent Value  Chronic Disease   Chronic disease during today's visit Diabetes, Other  [Hyperlipidemia,  sciatica pain,  thoracic pain]  General Interventions   General Interventions Discussed/Reviewed General Interventions Discussed, General Interventions Reviewed, Doctor Visits  Doctor Visits Discussed/Reviewed Doctor Visits Discussed, Doctor Visits Reviewed, PCP, Specialist  Exercise Interventions   Exercise Discussed/Reviewed Physical Activity, Exercise Reviewed, Exercise Discussed  Physical Activity Discussed/Reviewed Physical Activity Discussed, Physical Activity Reviewed, Types of exercise  Education Interventions   Education Provided Provided Education  Provided Verbal Education On Nutrition, When to see the doctor, Exercise  Mental Health Interventions   Mental Health Discussed/Reviewed Mental Health Discussed, Mental Health Reviewed, Grief and Loss  [patient declines LCSW]  Nutrition Interventions   Nutrition Discussed/Reviewed Nutrition Discussed, Fluid intake, Nutrition Reviewed  Pharmacy Interventions   Pharmacy Dicussed/Reviewed Pharmacy Topics Discussed, Pharmacy Topics Reviewed, Medications and their functions  Safety Interventions   Safety Discussed/Reviewed Home Safety, Fall Risk, Safety Reviewed,  Safety Discussed  Home Safety Assistive Devices          SDOH assessments and interventions  completed:  Yes  SDOH Interventions Today    Flowsheet Row Most Recent Value  SDOH Interventions   Food Insecurity Interventions Intervention Not Indicated  Housing Interventions Intervention Not Indicated  Transportation Interventions Intervention Not Indicated  Utilities Interventions Intervention Not Indicated        Care Coordination Interventions:  Yes, provided   Follow up plan: Follow up call scheduled for 03/15/23 @11 :00 AM    Encounter Outcome:  Patient Visit Completed

## 2023-01-30 NOTE — Patient Instructions (Signed)
Visit Information  Thank you for taking time to visit with me today. Please don't hesitate to contact me if I can be of assistance to you.   Following are the goals we discussed today:   Goals Addressed             This Visit's Progress    To consider grief counseling   On track    Care Coordination Interventions: Evaluation of current treatment plan related to grief and patient's adherence to plan as established by provider Discussed with patient the recent loss of her husband of 59 years Active listening / Reflection utilized  Emotional Support Provided Reiterated the availability of LCSW through [facility] as well as outside help through [facility] Instructed patient to keep her doctor well informed on new symptoms or concerns     To have arthritis pain and back pain with Sciatica evaluated by orthopedics   On track    Care Coordination Interventions: Reviewed provider established plan for pain management Determined patient continues to follow up with her Orthopedic MD and Rheumatologist for management of her chronic low back pain with sciatica and 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  Educated patient on the importance of adhering to her HEP for ongoing symptom management  Counseled on the importance of reporting any/all new or changed pain symptoms or management strategies to pain management provider Advised patient to report to care team affect of pain on daily activities Reviewed with patient prescribed pharmacological and nonpharmacological pain relief strategies          To reduce LDL Cholesterol level to provider established goal       Care Coordination Interventions: Provider established cholesterol goals reviewed Counseled on importance of regular laboratory monitoring as prescribed Reviewed role and benefits of statin for ASCVD risk reduction Reviewed importance of limiting foods high in cholesterol Reviewed exercise goals and target of 150  minutes per week Lipid Panel     Component Value Date/Time   CHOL 172 12/12/2022 1303   TRIG 103 12/12/2022 1303   HDL 45 12/12/2022 1303   CHOLHDL 3.8 12/12/2022 1303   LDLCALC 108 (H) 12/12/2022 1303   LABVLDL 19 12/12/2022 1303              Our next appointment is by telephone on 03/15/23 at 11:00 AM  Please call the care guide team at [phone] if you need to cancel or reschedule your appointment.   If you are experiencing a Mental Health or Behavioral Health Crisis or need someone to talk to, please call 1-800-273-TALK (toll free, 24 hour hotline)  The patient verbalized understanding of instructions, educational materials, and care plan provided today and DECLINED offer to receive copy of patient instructions, educational materials, and care plan.   Delsa Sale RN BSN CCM [place]  [facility], [facility] Nurse Care Coordinator  Direct Dial: [phone] Website: [email or web address]

## 2023-01-31 ENCOUNTER — Other Ambulatory Visit: Payer: Self-pay

## 2023-01-31 MED ORDER — LOSARTAN POTASSIUM 25 MG PO TABS: 25.0000 mg | ORAL_TABLET | Freq: Every day | ORAL | 2 refills | Status: DC

## 2023-02-06 ENCOUNTER — Other Ambulatory Visit: Payer: Self-pay | Admitting: Internal Medicine

## 2023-02-06 DIAGNOSIS — F4321 Adjustment disorder with depressed mood: Secondary | ICD-10-CM

## 2023-02-18 ENCOUNTER — Emergency Department (HOSPITAL_BASED_OUTPATIENT_CLINIC_OR_DEPARTMENT_OTHER): Payer: Medicare HMO

## 2023-02-18 ENCOUNTER — Inpatient Hospital Stay (HOSPITAL_BASED_OUTPATIENT_CLINIC_OR_DEPARTMENT_OTHER)
Admission: EM | Admit: 2023-02-18 | Discharge: 2023-02-24 | DRG: 520 | Disposition: A | Discharge status: Skilled Nursing Facility | Payer: Medicare HMO | Attending: Internal Medicine | Admitting: Internal Medicine

## 2023-02-18 ENCOUNTER — Encounter (HOSPITAL_BASED_OUTPATIENT_CLINIC_OR_DEPARTMENT_OTHER): Payer: Self-pay

## 2023-02-18 DIAGNOSIS — R531 Weakness: Secondary | ICD-10-CM | POA: Diagnosis not present

## 2023-02-18 DIAGNOSIS — R278 Other lack of coordination: Secondary | ICD-10-CM | POA: Diagnosis not present

## 2023-02-18 DIAGNOSIS — I739 Peripheral vascular disease, unspecified: Secondary | ICD-10-CM | POA: Diagnosis present

## 2023-02-18 DIAGNOSIS — I10 Essential (primary) hypertension: Secondary | ICD-10-CM | POA: Diagnosis present

## 2023-02-18 DIAGNOSIS — J45909 Unspecified asthma, uncomplicated: Secondary | ICD-10-CM | POA: Insufficient documentation

## 2023-02-18 DIAGNOSIS — M5430 Sciatica, unspecified side: Secondary | ICD-10-CM

## 2023-02-18 DIAGNOSIS — N182 Chronic kidney disease, stage 2 (mild): Secondary | ICD-10-CM | POA: Diagnosis present

## 2023-02-18 DIAGNOSIS — Z7982 Long term (current) use of aspirin: Secondary | ICD-10-CM | POA: Diagnosis not present

## 2023-02-18 DIAGNOSIS — Z8679 Personal history of other diseases of the circulatory system: Secondary | ICD-10-CM | POA: Diagnosis not present

## 2023-02-18 DIAGNOSIS — M48061 Spinal stenosis, lumbar region without neurogenic claudication: Secondary | ICD-10-CM | POA: Diagnosis present

## 2023-02-18 DIAGNOSIS — I2510 Atherosclerotic heart disease of native coronary artery without angina pectoris: Secondary | ICD-10-CM | POA: Diagnosis present

## 2023-02-18 DIAGNOSIS — M5441 Lumbago with sciatica, right side: Secondary | ICD-10-CM | POA: Diagnosis present

## 2023-02-18 DIAGNOSIS — Z87891 Personal history of nicotine dependence: Secondary | ICD-10-CM

## 2023-02-18 DIAGNOSIS — S33140A Subluxation of L4/L5 lumbar vertebra, initial encounter: Secondary | ICD-10-CM | POA: Diagnosis not present

## 2023-02-18 DIAGNOSIS — Z8269 Family history of other diseases of the musculoskeletal system and connective tissue: Secondary | ICD-10-CM

## 2023-02-18 DIAGNOSIS — I252 Old myocardial infarction: Secondary | ICD-10-CM | POA: Diagnosis not present

## 2023-02-18 DIAGNOSIS — M4807 Spinal stenosis, lumbosacral region: Secondary | ICD-10-CM | POA: Diagnosis not present

## 2023-02-18 DIAGNOSIS — F411 Generalized anxiety disorder: Secondary | ICD-10-CM | POA: Insufficient documentation

## 2023-02-18 DIAGNOSIS — M5135 Other intervertebral disc degeneration, thoracolumbar region: Secondary | ICD-10-CM | POA: Diagnosis not present

## 2023-02-18 DIAGNOSIS — Z6832 Body mass index (BMI) 32.0-32.9, adult: Secondary | ICD-10-CM

## 2023-02-18 DIAGNOSIS — F4321 Adjustment disorder with depressed mood: Secondary | ICD-10-CM

## 2023-02-18 DIAGNOSIS — I160 Hypertensive urgency: Secondary | ICD-10-CM | POA: Diagnosis present

## 2023-02-18 DIAGNOSIS — M549 Dorsalgia, unspecified: Secondary | ICD-10-CM

## 2023-02-18 DIAGNOSIS — Z7902 Long term (current) use of antithrombotics/antiplatelets: Secondary | ICD-10-CM | POA: Diagnosis not present

## 2023-02-18 DIAGNOSIS — M4316 Spondylolisthesis, lumbar region: Secondary | ICD-10-CM | POA: Diagnosis not present

## 2023-02-18 DIAGNOSIS — M6281 Muscle weakness (generalized): Secondary | ICD-10-CM | POA: Diagnosis not present

## 2023-02-18 DIAGNOSIS — M810 Age-related osteoporosis without current pathological fracture: Secondary | ICD-10-CM | POA: Diagnosis present

## 2023-02-18 DIAGNOSIS — I6523 Occlusion and stenosis of bilateral carotid arteries: Secondary | ICD-10-CM | POA: Diagnosis present

## 2023-02-18 DIAGNOSIS — I129 Hypertensive chronic kidney disease with stage 1 through stage 4 chronic kidney disease, or unspecified chronic kidney disease: Secondary | ICD-10-CM | POA: Diagnosis present

## 2023-02-18 DIAGNOSIS — J4520 Mild intermittent asthma, uncomplicated: Secondary | ICD-10-CM | POA: Diagnosis not present

## 2023-02-18 DIAGNOSIS — D509 Iron deficiency anemia, unspecified: Secondary | ICD-10-CM | POA: Diagnosis present

## 2023-02-18 DIAGNOSIS — R2681 Unsteadiness on feet: Secondary | ICD-10-CM | POA: Diagnosis not present

## 2023-02-18 DIAGNOSIS — E669 Obesity, unspecified: Secondary | ICD-10-CM | POA: Diagnosis present

## 2023-02-18 DIAGNOSIS — M47816 Spondylosis without myelopathy or radiculopathy, lumbar region: Secondary | ICD-10-CM | POA: Diagnosis not present

## 2023-02-18 DIAGNOSIS — Z79899 Other long term (current) drug therapy: Secondary | ICD-10-CM

## 2023-02-18 DIAGNOSIS — N183 Chronic kidney disease, stage 3 unspecified: Secondary | ICD-10-CM | POA: Diagnosis present

## 2023-02-18 DIAGNOSIS — N261 Atrophy of kidney (terminal): Secondary | ICD-10-CM | POA: Diagnosis not present

## 2023-02-18 DIAGNOSIS — E785 Hyperlipidemia, unspecified: Secondary | ICD-10-CM | POA: Diagnosis present

## 2023-02-18 DIAGNOSIS — N1831 Chronic kidney disease, stage 3a: Secondary | ICD-10-CM | POA: Diagnosis present

## 2023-02-18 DIAGNOSIS — I700 Atherosclerosis of aorta: Secondary | ICD-10-CM | POA: Diagnosis not present

## 2023-02-18 DIAGNOSIS — M5440 Lumbago with sciatica, unspecified side: Secondary | ICD-10-CM | POA: Diagnosis not present

## 2023-02-18 DIAGNOSIS — Z885 Allergy status to narcotic agent status: Secondary | ICD-10-CM | POA: Diagnosis not present

## 2023-02-18 DIAGNOSIS — I6529 Occlusion and stenosis of unspecified carotid artery: Secondary | ICD-10-CM | POA: Insufficient documentation

## 2023-02-18 DIAGNOSIS — M5126 Other intervertebral disc displacement, lumbar region: Secondary | ICD-10-CM | POA: Diagnosis present

## 2023-02-18 DIAGNOSIS — R41841 Cognitive communication deficit: Secondary | ICD-10-CM | POA: Diagnosis not present

## 2023-02-18 DIAGNOSIS — Z8249 Family history of ischemic heart disease and other diseases of the circulatory system: Secondary | ICD-10-CM | POA: Diagnosis not present

## 2023-02-18 DIAGNOSIS — G8929 Other chronic pain: Secondary | ICD-10-CM | POA: Diagnosis present

## 2023-02-18 DIAGNOSIS — M545 Low back pain, unspecified: Secondary | ICD-10-CM | POA: Diagnosis present

## 2023-02-18 HISTORY — DX: Sciatica, unspecified side: M54.30

## 2023-02-18 LAB — CBC
HCT: 37.5 % (ref 36.0–46.0)
Hemoglobin: 12 g/dL (ref 12.0–15.0)
MCH: 23.6 pg — ABNORMAL LOW (ref 26.0–34.0)
MCHC: 32 g/dL (ref 30.0–36.0)
MCV: 73.8 fL — ABNORMAL LOW (ref 80.0–100.0)
Platelets: 418 10*3/uL — ABNORMAL HIGH (ref 150–400)
RBC: 5.08 MIL/uL (ref 3.87–5.11)
RDW: 15 % (ref 11.5–15.5)
WBC: 8.6 10*3/uL (ref 4.0–10.5)
nRBC: 0 % (ref 0.0–0.2)

## 2023-02-18 LAB — BASIC METABOLIC PANEL
Anion gap: 10 (ref 5–15)
BUN: 9 mg/dL (ref 8–23)
CO2: 27 mmol/L (ref 22–32)
Calcium: 8.5 mg/dL — ABNORMAL LOW (ref 8.9–10.3)
Chloride: 106 mmol/L (ref 98–111)
Creatinine, Ser: 0.83 mg/dL (ref 0.44–1.00)
GFR, Estimated: >60 mL/min (ref >60)
Glucose, Bld: 79 mg/dL (ref 70–99)
Potassium: 3.5 mmol/L (ref 3.5–5.1)
Sodium: 143 mmol/L (ref 135–145)

## 2023-02-18 MED ORDER — LOSARTAN POTASSIUM 50 MG PO TABS
25.0000 mg | ORAL_TABLET | Freq: Every day | ORAL | Status: DC
  Administered 2023-02-18: 25 mg via ORAL
  Filled 2023-02-18: qty 1

## 2023-02-18 MED ORDER — AMLODIPINE BESYLATE 5 MG PO TABS
10.0000 mg | ORAL_TABLET | Freq: Once | ORAL | Status: AC
  Administered 2023-02-18: 10 mg via ORAL
  Filled 2023-02-18: qty 2

## 2023-02-18 MED ORDER — LORAZEPAM 2 MG/ML IJ SOLN
0.5000 mg | Freq: Once | INTRAMUSCULAR | Status: AC
  Administered 2023-02-18: 0.5 mg via INTRAVENOUS
  Filled 2023-02-18: qty 1

## 2023-02-18 MED ORDER — HYDROMORPHONE HCL 1 MG/ML IJ SOLN
0.5000 mg | Freq: Once | INTRAMUSCULAR | Status: AC
  Administered 2023-02-18: 0.5 mg via INTRAVENOUS
  Filled 2023-02-18: qty 1

## 2023-02-18 MED ORDER — FENTANYL CITRATE PF 50 MCG/ML IJ SOSY
50.0000 ug | PREFILLED_SYRINGE | Freq: Once | INTRAMUSCULAR | Status: AC
  Administered 2023-02-18: 50 ug via INTRAVENOUS
  Filled 2023-02-18: qty 1

## 2023-02-18 NOTE — ED Triage Notes (Signed)
 Please note that is was PTAR who brought her here from home. She c/o low back pain real bad for about three weeks; and worse for the past two days. She states this low back pain radiates equally into both right and left hip/legs. She denies any recent trauma. She states she does have a hx of sciatica. Two daughters are with her.

## 2023-02-18 NOTE — ED Notes (Signed)
-  Called [facility] at 816pm for transportation to [facility] ED. Dr Doran Durand accepting.

## 2023-02-18 NOTE — ED Provider Notes (Signed)
 Kathryn [name]   CSN: [id number] Arrival date & time: 02/18/23  1557     History  Chief Complaint  Patient presents with   Back Pain    Kathryn [name] is a 83 y.o. female.  Patient with history of sciatica, NSTEMI, CKD, hypertension, hyperlipidemia presents today with complaints of back pain. She states that same has been ongoing for the past 3 weeks and worsened in the last 2 days.  She went to urgent care on 12/30 and had x-rays of her thoracic and lumbar spine that were normal and she was discharged home with tramadol  which she has been taking with no improvement. Pain radiates into her right leg. States today pain was so severe that she was unable to walk due to pain. She normally walks unassisted and has been walking with a cane since her pain started. Pain radiates down her right leg.  Denies any trauma.  No loss of bowel or bladder function or saddle paresthesias.  No urinary symptoms.  No numbness/tingling in her extremities.  Additionally, of [name] patient states that she has not taken her medication today due to pain.  This includes her antihypertensives.  The history is provided by the patient. No language interpreter was used.  Back Pain      Home Medications Prior to Admission medications   Medication Sig Start Date End Date Taking? Authorizing Provider  acetaminophen  (TYLENOL ) 325 MG tablet Take 325 mg by mouth every 6 (six) hours as needed for mild pain.    [provider]  albuterol  (VENTOLIN  HFA) 108 (90 Base) MCG/ACT inhaler Inhale 2 puffs into the lungs every 4 (four) hours as needed for wheezing or shortness of breath. Patient not taking: Reported on 12/12/2022 08/21/22   Teresa Shelba SAUNDERS, NP  ALPRAZolam  (XANAX ) 0.25 MG tablet TAKE ONE TABLET BY MOUTH EVERY EVENING 02/06/23   Jarold Medici, MD  amLODipine  (NORVASC ) 10 MG tablet TAKE ONE TABLET BY MOUTH ONCE DAILY. 12/08/22   Jarold Medici, MD   Apoaequorin (PREVAGEN PO) Take 1 tablet by mouth daily.    [provider]  Aspirin  81 MG CAPS     [provider]  CALCIUM  PO Take 1 tablet by mouth daily. 600 mg    [provider]  carvedilol  (COREG ) 12.5 MG tablet TAKE (1) TABLET BY MOUTH TWICE DAILY. 09/08/22   Ladona Heinz, MD  cetirizine (ZYRTEC) 10 MG tablet Take 10 mg by mouth at bedtime.    [provider]  denosumab  (PROLIA ) 60 MG/ML SOSY injection Inject 60 mg into the skin every 6 (six) months. 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[address], [place] [address].Appt on 08/03/22 07/25/22   Cheryl Waddell HERO, PA-C  diclofenac  Sodium (VOLTAREN ) 1 % GEL Apply 4 g topically 4 (four) times daily. 01/28/21   Stover, Titorya, DPM  doxycycline  (VIBRA -TABS) 100 MG tablet Take 1 tablet (100 mg total) by mouth 2 (two) times daily. 12/14/22   Jarold Medici, MD  ezetimibe  (ZETIA ) 10 MG tablet TAKE 1 TABLET BY MOUTH ONCE A DAY. 04/21/22   Custovic, Sabina, DO  famotidine  (PEPCID ) 20 MG tablet TAKE ONE TABLET BY MOUTH ONCE DAILY. 09/13/22   Jarold Medici, MD  fluticasone  (FLONASE ) 50 MCG/ACT nasal spray Place 2 sprays into both nostrils daily. Patient not taking: Reported on 12/12/2022 09/28/21   Leath-Warren, Etta PARAS, NP  furosemide  (LASIX ) 20 MG tablet TAKE 1 TABLET BY MOUTH DAILY Patient not taking: Reported on 12/12/2022 02/09/21  Cantwell, Celeste C, PA-C  losartan  (COZAAR ) 25 MG tablet Take 1 tablet (25 mg total) by mouth daily. 01/31/23 01/31/24  Jarold Medici, MD  Multiple Vitamin (MULTIVITAMIN WITH MINERALS) TABS Take 1 tablet by mouth daily.    [provider]  nitroGLYCERIN  (NITROSTAT ) 0.4 MG SL tablet Place 1 tablet (0.4 mg total) under the tongue every 5 (five) minutes x 3 doses as needed for chest pain. 04/29/22   Ladona Heinz, MD  pravastatin  (PRAVACHOL ) 40 MG tablet Take 1 tablet (40 mg total) by mouth every evening. 01/05/23 04/05/23  Jarold Medici, MD  pregabalin  (LYRICA ) 75 MG capsule TAKE 1  CAPSULE BY MOUTH THREE TIMES A DAY. 10/20/22   Jarold Medici, MD  Probiotic Product (PROBIOTIC DAILY PO) Take 1 tablet by mouth daily at 12 noon.    [provider]  sertraline  (ZOLOFT ) 25 MG tablet TAKE 1 TABLET BY MOUTH DAILY. 09/26/22   Jarold Medici, MD  tiZANidine  (ZANAFLEX ) 4 MG tablet TAKE 1 TABLET BY MOUTH ONCE AT BEDTIME AS NEEDED FOR MUSCLE SPASMS. 09/22/22   Jarold Medici, MD  triamcinolone  cream (KENALOG ) 0.1 % Apply 1 Application topically 2 (two) times daily. Patient taking differently: Apply 1 Application topically as needed. 08/26/21   Georgina Speaks, FNP      Allergies    Codeine and Ibuprofen    Review of Systems   Review of Systems  Musculoskeletal:  Positive for back pain.  All other systems reviewed and are negative.   Physical Exam Updated Vital Signs BP (!) 179/39   Pulse 96   Temp 98.7 F (37.1 C) (Oral)   Resp 16   SpO2 98%  Physical Exam Vitals and nursing [name] reviewed.  Constitutional:      General: She is not in acute distress.    Appearance: Normal appearance. She is normal weight. She is not ill-appearing, toxic-appearing or diaphoretic.  HENT:     Head: Normocephalic and atraumatic.  Cardiovascular:     Rate and Rhythm: Normal rate.  Pulmonary:     Effort: Pulmonary effort is normal. No respiratory distress.  Abdominal:     General: Abdomen is flat.     Palpations: Abdomen is soft.     Tenderness: There is no abdominal tenderness.  Musculoskeletal:        General: Normal range of motion.     Cervical back: Normal range of motion.     Comments: No tenderness to palpation of cervical or thoracic spine.  Midline tenderness to palpation of the lumbar spine as well as the right paraspinous muscles.  No step-offs, lesions, deformity, or overlying skin changes.  Patient able to lift her left leg off the bed without issue.  Unable to lift her right leg due to pain.  Passive ROM intact with pain.  DP and PT pulses intact and 2+.  Good distal  sensation.  Ambulation not tested due to pain.  Skin:    General: Skin is warm and dry.  Neurological:     General: No focal deficit present.     Mental Status: She is alert.  Psychiatric:        Mood and Affect: Mood normal.        Behavior: Behavior normal.     ED Results / Procedures / Treatments   Labs (all labs ordered are listed, but only abnormal results are displayed) Labs Reviewed  CBC - Abnormal; Notable for the following components:      Result Value   MCV 73.8 (*)  MCH 23.6 (*)    Platelets 418 (*)    All other components within normal limits  BASIC METABOLIC PANEL - Abnormal; Notable for the following components:   Calcium  8.5 (*)    All other components within normal limits    EKG None  Radiology CT Lumbar Spine Wo Contrast Result Date: 02/18/2023 CLINICAL DATA:  Low back pain, increased fracture risk EXAM: CT LUMBAR SPINE WITHOUT CONTRAST TECHNIQUE: Multidetector CT imaging of the lumbar spine was performed without intravenous contrast administration. Multiplanar CT image reconstructions were also generated. RADIATION DOSE REDUCTION: This exam was performed according to the departmental dose-optimization program which includes automated exposure control, adjustment of the mA and/or kV according to patient size and/or use of iterative reconstruction technique. COMPARISON:  None Available. FINDINGS: Segmentation: 5 lumbar type vertebrae. Alignment: Trace anterolisthesis of L3 on L4. Grade 1 anterolisthesis of L4 on L5. Vertebrae: No acute fracture or focal pathologic process. Paraspinal and other soft tissues: Diverticulosis without evidence of diverticulitis. Aortic atherosclerotic calcifications. Status post cholecystectomy. The right kidney is small in size. Left kidney is without evidence of hydronephrosis or nephrolithiasis. Disc levels: Moderate spinal canal narrowing at C3-C4 secondary to a combination of a disc bulge and ligamentum flavum hypertrophy. 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 There is likely severe spinal canal stenosis at this level. Recommend further evaluation with a lumbar spine MRI. IMPRESSION: 1. No acute fracture or traumatic listhesis. 2. 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 Recommend further evaluation with a lumbar spine MRI. Aortic Atherosclerosis (ICD10-I70.0). Electronically Signed   By: Lyndall Gore M.D.   On: 02/18/2023 19:15    Procedures Procedures    Medications Ordered in ED Medications  losartan  (COZAAR ) tablet 25 mg (25 mg Oral Given 02/18/23 1839)  fentaNYL  (SUBLIMAZE ) injection 50 mcg (50 mcg Intravenous Given 02/18/23 1836)  amLODipine  (NORVASC ) tablet 10 mg (10 mg Oral Given 02/18/23 1839)  HYDROmorphone  (DILAUDID ) injection 0.5 mg (0.5 mg Intravenous Given 02/18/23 2006)    ED Course/ Medical Decision Making/ A&P                                 Medical Decision Making Amount and/or Complexity of Data Reviewed Labs: ordered. Radiology: ordered.  Risk Prescription drug management. Decision regarding hospitalization.   This patient is a 83 y.o. female who presents to the ED for concern of low back pain, this involves an extensive number of treatment options, and is a complaint that carries with it a high risk of complications and morbidity. The emergent differential diagnosis prior to evaluation includes, but is not limited to,  Fracture (acute/chronic), muscle strain, cauda equina, spinal stenosis, DDD, ligamentous injury, disk herniation, metastatic cancer, vertebral osteomyelitis, kidney stone, pyelonephritis, AAA, pancreatitis, bowel obstruction, meningitis.    This is not an exhaustive differential.   Past Medical History / Co-morbidities / Social History:  has a past medical history of Anxiety, Arthritis, Bruises easily, Chronic back  pain, Chronic neck pain, Coronary artery disease, Dysphagia, Headache(784.0), History of colonic polyps, Hyperlipidemia, Hypertension, Myocardial infarction (HCC) (2019), Osteoporosis, Peripheral vascular disease (HCC), Sciatica, and Seasonal allergies.  Additional history: Chart reviewed. Pertinent results include: X-ray thoracic and lumbar spine negative at urgent care yesterday.  POC UA also unremarkable.  Physical Exam: Physical exam  performed. The pertinent findings include: Midline lumbar spine tenderness to palpation as well as the right-sided paraspinous muscles.  No step-offs, lesions, deformity, or overlying skin changes.  Good strength noted to the left leg, unable to lift the right leg off the bed due to pain.  Good distal pulses and sensation intact.  Lab Tests: I ordered, and personally interpreted labs.  The pertinent results include: No acute laboratory abnormalities   Imaging Studies: I ordered imaging studies including CT lumbar spine. I independently visualized and interpreted imaging which showed   1. No acute fracture or traumatic listhesis. 2. 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 Recommend further evaluation with a lumbar spine MRI  I agree with the radiologist interpretation.   Medications: I ordered medication including norvasc , losartan , fentanyl , dilaudid   for pain, hypertension. Reevaluation of the patient after these medicines showed that the patient improved. I have reviewed the patients home medicines and have made adjustments as needed.   Disposition: After consideration of the diagnostic results and the patients response to treatment, I feel that patient will require ER to ER transfer to [facility] for lumbar spine MRI. Patient and family are understanding and in agreement with this plan.   Discussed patient with [facility] EDP Dr. Jerral  who accepts patient for transfer.   I discussed this case with my attending physician Dr. Ginger who cosigned this [name] including patient's presenting symptoms, physical exam, and planned diagnostics and interventions. Attending physician stated agreement with plan or made changes to plan which were implemented.    Final Clinical Impression(s) / ED Diagnoses Final diagnoses:  Acute right-sided low back pain with right-sided sciatica    Rx / DC Orders ED Discharge Orders     None         Nora Lauraine DELENA DEVONNA 02/20/23 1417    Tegeler, Lonni PARAS, MD 02/20/23 2333

## 2023-02-18 NOTE — ED Notes (Addendum)
 Assumed care from [facility]. Patient alert and oriented x4, no acute distress noted at this time. Patient states her pain is 9/10. MD notified.

## 2023-02-19 ENCOUNTER — Emergency Department (HOSPITAL_COMMUNITY): Payer: Medicare HMO

## 2023-02-19 ENCOUNTER — Encounter (HOSPITAL_COMMUNITY): Payer: Self-pay | Admitting: Internal Medicine

## 2023-02-19 DIAGNOSIS — I6529 Occlusion and stenosis of unspecified carotid artery: Secondary | ICD-10-CM | POA: Insufficient documentation

## 2023-02-19 DIAGNOSIS — Z79899 Other long term (current) drug therapy: Secondary | ICD-10-CM | POA: Diagnosis not present

## 2023-02-19 DIAGNOSIS — M4807 Spinal stenosis, lumbosacral region: Secondary | ICD-10-CM | POA: Diagnosis not present

## 2023-02-19 DIAGNOSIS — Z8679 Personal history of other diseases of the circulatory system: Secondary | ICD-10-CM

## 2023-02-19 DIAGNOSIS — N261 Atrophy of kidney (terminal): Secondary | ICD-10-CM | POA: Diagnosis not present

## 2023-02-19 DIAGNOSIS — I739 Peripheral vascular disease, unspecified: Secondary | ICD-10-CM | POA: Diagnosis present

## 2023-02-19 DIAGNOSIS — N182 Chronic kidney disease, stage 2 (mild): Secondary | ICD-10-CM | POA: Diagnosis present

## 2023-02-19 DIAGNOSIS — M5441 Lumbago with sciatica, right side: Secondary | ICD-10-CM | POA: Diagnosis present

## 2023-02-19 DIAGNOSIS — Z7982 Long term (current) use of aspirin: Secondary | ICD-10-CM | POA: Diagnosis not present

## 2023-02-19 DIAGNOSIS — I160 Hypertensive urgency: Secondary | ICD-10-CM | POA: Diagnosis present

## 2023-02-19 DIAGNOSIS — Z8249 Family history of ischemic heart disease and other diseases of the circulatory system: Secondary | ICD-10-CM | POA: Diagnosis not present

## 2023-02-19 DIAGNOSIS — I2510 Atherosclerotic heart disease of native coronary artery without angina pectoris: Secondary | ICD-10-CM | POA: Diagnosis present

## 2023-02-19 DIAGNOSIS — I6523 Occlusion and stenosis of bilateral carotid arteries: Secondary | ICD-10-CM

## 2023-02-19 DIAGNOSIS — F411 Generalized anxiety disorder: Secondary | ICD-10-CM | POA: Insufficient documentation

## 2023-02-19 DIAGNOSIS — S33140A Subluxation of L4/L5 lumbar vertebra, initial encounter: Secondary | ICD-10-CM | POA: Diagnosis not present

## 2023-02-19 DIAGNOSIS — E669 Obesity, unspecified: Secondary | ICD-10-CM | POA: Diagnosis present

## 2023-02-19 DIAGNOSIS — J45909 Unspecified asthma, uncomplicated: Secondary | ICD-10-CM | POA: Diagnosis present

## 2023-02-19 DIAGNOSIS — M5126 Other intervertebral disc displacement, lumbar region: Secondary | ICD-10-CM

## 2023-02-19 DIAGNOSIS — E785 Hyperlipidemia, unspecified: Secondary | ICD-10-CM | POA: Diagnosis present

## 2023-02-19 DIAGNOSIS — M5440 Lumbago with sciatica, unspecified side: Secondary | ICD-10-CM

## 2023-02-19 DIAGNOSIS — I129 Hypertensive chronic kidney disease with stage 1 through stage 4 chronic kidney disease, or unspecified chronic kidney disease: Secondary | ICD-10-CM | POA: Diagnosis present

## 2023-02-19 DIAGNOSIS — J4520 Mild intermittent asthma, uncomplicated: Secondary | ICD-10-CM

## 2023-02-19 DIAGNOSIS — M545 Low back pain, unspecified: Secondary | ICD-10-CM | POA: Diagnosis present

## 2023-02-19 DIAGNOSIS — Z87891 Personal history of nicotine dependence: Secondary | ICD-10-CM | POA: Diagnosis not present

## 2023-02-19 DIAGNOSIS — M810 Age-related osteoporosis without current pathological fracture: Secondary | ICD-10-CM | POA: Diagnosis present

## 2023-02-19 DIAGNOSIS — I10 Essential (primary) hypertension: Secondary | ICD-10-CM

## 2023-02-19 DIAGNOSIS — G8929 Other chronic pain: Secondary | ICD-10-CM | POA: Diagnosis present

## 2023-02-19 DIAGNOSIS — Z885 Allergy status to narcotic agent status: Secondary | ICD-10-CM | POA: Diagnosis not present

## 2023-02-19 DIAGNOSIS — I252 Old myocardial infarction: Secondary | ICD-10-CM | POA: Diagnosis not present

## 2023-02-19 DIAGNOSIS — M48061 Spinal stenosis, lumbar region without neurogenic claudication: Secondary | ICD-10-CM | POA: Diagnosis present

## 2023-02-19 DIAGNOSIS — Z6832 Body mass index (BMI) 32.0-32.9, adult: Secondary | ICD-10-CM | POA: Diagnosis not present

## 2023-02-19 DIAGNOSIS — M549 Dorsalgia, unspecified: Secondary | ICD-10-CM

## 2023-02-19 DIAGNOSIS — M5135 Other intervertebral disc degeneration, thoracolumbar region: Secondary | ICD-10-CM | POA: Diagnosis not present

## 2023-02-19 DIAGNOSIS — Z7902 Long term (current) use of antithrombotics/antiplatelets: Secondary | ICD-10-CM | POA: Diagnosis not present

## 2023-02-19 DIAGNOSIS — M4316 Spondylolisthesis, lumbar region: Secondary | ICD-10-CM | POA: Diagnosis not present

## 2023-02-19 DIAGNOSIS — M47816 Spondylosis without myelopathy or radiculopathy, lumbar region: Secondary | ICD-10-CM | POA: Diagnosis not present

## 2023-02-19 DIAGNOSIS — D509 Iron deficiency anemia, unspecified: Secondary | ICD-10-CM | POA: Diagnosis present

## 2023-02-19 MED ORDER — HEPARIN SODIUM (PORCINE) 5000 UNIT/ML IJ SOLN
5000.0000 [IU] | Freq: Three times a day (TID) | INTRAMUSCULAR | Status: DC
Start: 2023-02-19 — End: 2023-02-19

## 2023-02-19 MED ORDER — ONDANSETRON HCL 4 MG/2ML IJ SOLN
4.0000 mg | Freq: Four times a day (QID) | INTRAMUSCULAR | Status: DC | PRN
  Administered 2023-02-23: 4 mg via INTRAVENOUS
  Filled 2023-02-19: qty 2

## 2023-02-19 MED ORDER — SENNOSIDES-DOCUSATE SODIUM 8.6-50 MG PO TABS
1.0000 | ORAL_TABLET | Freq: Two times a day (BID) | ORAL | Status: DC
  Administered 2023-02-19 – 2023-02-24 (×10): 1 via ORAL
  Filled 2023-02-19 (×11): qty 1

## 2023-02-19 MED ORDER — SODIUM CHLORIDE 0.9% FLUSH: 3.0000 mL | Freq: Two times a day (BID) | INTRAVENOUS | Status: DC

## 2023-02-19 MED ORDER — ACETAMINOPHEN 325 MG PO TABS
650.0000 mg | ORAL_TABLET | Freq: Four times a day (QID) | ORAL | Status: DC | PRN
  Administered 2023-02-21 – 2023-02-24 (×3): 650 mg via ORAL
  Filled 2023-02-19 (×3): qty 2

## 2023-02-19 MED ORDER — FERROUS SULFATE 325 (65 FE) MG PO TABS
325.0000 mg | ORAL_TABLET | Freq: Every day | ORAL | Status: DC
  Administered 2023-02-19 – 2023-02-24 (×6): 325 mg via ORAL
  Filled 2023-02-19 (×6): qty 1

## 2023-02-19 MED ORDER — HYDROCODONE-ACETAMINOPHEN 5-325 MG PO TABS
1.0000 | ORAL_TABLET | ORAL | Status: DC | PRN
  Administered 2023-02-19: 1 via ORAL
  Administered 2023-02-20 – 2023-02-23 (×6): 2 via ORAL
  Filled 2023-02-19: qty 1
  Filled 2023-02-19 (×6): qty 2

## 2023-02-19 MED ORDER — EZETIMIBE 10 MG PO TABS
10.0000 mg | ORAL_TABLET | Freq: Every day | ORAL | Status: DC
  Administered 2023-02-19 – 2023-02-24 (×5): 10 mg via ORAL
  Filled 2023-02-19 (×5): qty 1

## 2023-02-19 MED ORDER — CARVEDILOL 12.5 MG PO TABS
12.5000 mg | ORAL_TABLET | Freq: Two times a day (BID) | ORAL | Status: DC
  Administered 2023-02-20 – 2023-02-24 (×8): 12.5 mg via ORAL
  Filled 2023-02-19 (×9): qty 1

## 2023-02-19 MED ORDER — HYDROMORPHONE HCL 1 MG/ML IJ SOLN
0.5000 mg | Freq: Once | INTRAMUSCULAR | Status: AC
Start: 2023-02-19 — End: 2023-02-19
  Administered 2023-02-19: 0.5 mg via INTRAVENOUS
  Filled 2023-02-19: qty 1

## 2023-02-19 MED ORDER — LOSARTAN POTASSIUM 50 MG PO TABS
25.0000 mg | ORAL_TABLET | Freq: Every day | ORAL | Status: DC
  Administered 2023-02-20 – 2023-02-24 (×5): 25 mg via ORAL
  Filled 2023-02-19 (×6): qty 1

## 2023-02-19 MED ORDER — SODIUM CHLORIDE 0.9 % IV SOLN: 250.0000 mL | INTRAVENOUS | Status: DC | PRN

## 2023-02-19 MED ORDER — PRAVASTATIN SODIUM 40 MG PO TABS
40.0000 mg | ORAL_TABLET | Freq: Every evening | ORAL | Status: DC
  Administered 2023-02-19 – 2023-02-23 (×4): 40 mg via ORAL
  Filled 2023-02-19 (×4): qty 1

## 2023-02-19 MED ORDER — PREGABALIN 75 MG PO CAPS
75.0000 mg | ORAL_CAPSULE | Freq: Three times a day (TID) | ORAL | Status: DC
  Administered 2023-02-19 – 2023-02-24 (×14): 75 mg via ORAL
  Filled 2023-02-19 (×3): qty 1
  Filled 2023-02-19: qty 3
  Filled 2023-02-19 (×5): qty 1
  Filled 2023-02-19: qty 3
  Filled 2023-02-19 (×2): qty 1
  Filled 2023-02-19: qty 3
  Filled 2023-02-19: qty 1

## 2023-02-19 MED ORDER — SODIUM CHLORIDE 0.9% FLUSH: 3.0000 mL | INTRAVENOUS | Status: DC | PRN

## 2023-02-19 MED ORDER — SENNOSIDES-DOCUSATE SODIUM 8.6-50 MG PO TABS
1.0000 | ORAL_TABLET | Freq: Every evening | ORAL | Status: DC | PRN
  Administered 2023-02-19: 1 via ORAL

## 2023-02-19 MED ORDER — LEVALBUTEROL HCL 0.63 MG/3ML IN NEBU: 0.6300 mg | INHALATION_SOLUTION | Freq: Four times a day (QID) | RESPIRATORY_TRACT | Status: DC | PRN

## 2023-02-19 MED ORDER — ACETAMINOPHEN 650 MG RE SUPP
650.0000 mg | Freq: Four times a day (QID) | RECTAL | Status: DC | PRN
Start: 2023-02-19 — End: 2023-02-24

## 2023-02-19 MED ORDER — SERTRALINE HCL 25 MG PO TABS
25.0000 mg | ORAL_TABLET | Freq: Every day | ORAL | Status: DC
  Administered 2023-02-19 – 2023-02-24 (×5): 25 mg via ORAL
  Filled 2023-02-19 (×5): qty 1

## 2023-02-19 MED ORDER — AMLODIPINE BESYLATE 10 MG PO TABS
10.0000 mg | ORAL_TABLET | Freq: Every day | ORAL | Status: DC
  Administered 2023-02-20 – 2023-02-23 (×4): 10 mg via ORAL
  Filled 2023-02-19: qty 2
  Filled 2023-02-19 (×4): qty 1

## 2023-02-19 MED ORDER — TIZANIDINE HCL 4 MG PO TABS: 4.0000 mg | ORAL_TABLET | Freq: Three times a day (TID) | ORAL | Status: DC | PRN

## 2023-02-19 MED ORDER — FENTANYL CITRATE PF 50 MCG/ML IJ SOSY
12.5000 ug | PREFILLED_SYRINGE | INTRAMUSCULAR | Status: DC | PRN
  Administered 2023-02-20 – 2023-02-21 (×2): 25 ug via INTRAVENOUS
  Filled 2023-02-19 (×3): qty 1

## 2023-02-19 MED ORDER — LOSARTAN POTASSIUM 50 MG PO TABS: 25.0000 mg | ORAL_TABLET | Freq: Every day | ORAL | Status: DC

## 2023-02-19 MED ORDER — LORAZEPAM 2 MG/ML IJ SOLN
0.5000 mg | Freq: Once | INTRAMUSCULAR | Status: AC
  Administered 2023-02-19: 0.5 mg via INTRAVENOUS
  Filled 2023-02-19: qty 1

## 2023-02-19 MED ORDER — ALPRAZOLAM 0.25 MG PO TABS
0.2500 mg | ORAL_TABLET | Freq: Every evening | ORAL | Status: DC | PRN
  Administered 2023-02-19 – 2023-02-21 (×2): 0.25 mg via ORAL
  Filled 2023-02-19 (×2): qty 1

## 2023-02-19 MED ORDER — ONDANSETRON HCL 4 MG PO TABS
4.0000 mg | ORAL_TABLET | Freq: Four times a day (QID) | ORAL | Status: DC | PRN
  Administered 2023-02-23: 4 mg via ORAL
  Filled 2023-02-19: qty 1

## 2023-02-19 MED ORDER — FAMOTIDINE 20 MG PO TABS
20.0000 mg | ORAL_TABLET | Freq: Every day | ORAL | Status: DC
  Administered 2023-02-19 – 2023-02-24 (×6): 20 mg via ORAL
  Filled 2023-02-19 (×6): qty 1

## 2023-02-19 MED ORDER — ASPIRIN 81 MG PO CAPS: 81.0000 mg | ORAL_CAPSULE | Freq: Every day | ORAL | Status: DC

## 2023-02-19 NOTE — ED Provider Notes (Signed)
  Physical Exam  BP 114/61 (BP Location: Left Arm)   Pulse 92   Temp 97.9 F (36.6 C) (Oral)   Resp 18   SpO2 98%   Physical Exam  Procedures  Procedures  ED Course / MDM    Medical Decision Making Amount and/or Complexity of Data Reviewed Labs: ordered. Radiology: ordered.  Risk Prescription drug management.   Patient care assumed after patient was transferred from outside [facility] ER for MRI lumbar spine due to inability to move right leg due to back pain.  Patient with history of sciatica, NSTEMI, CKD, hypertension complains of back pain which began approximately 3 weeks ago and worsened significantly over the past few days.  She states that on December 30 she had x-rays of her lumbar and thoracic spine at urgent care which were negative and was discharged home with tramadol .  She has had no improvement in symptoms since that time.  The pain radiates into the right leg.  She was unable to walk earlier today due to pain.  She normally walks unassisted and has been utilizing a cane since pain began.  She denies trauma, loss of bowel or bladder function, saddle anesthesia, urinary symptoms.  She denies any numbness or tingling in her extremities.  MRI lumbar spine shows: 1. At L4-L5, new large disc protrusion with progressive severe canal  stenosis.  2. Milder multilevel degenerative changes are detailed above.  3. Atrophic right kidney, smaller than on the 2021 prior.    I requested consultation with neurosurgery. Dr. Lanis. He requests medicine admission. Will formally consult in AM with consideration of possible microdiscectomy  I requested consultation with the hospitalist. Dr.Sundil agrees to see patient for admission.      Kathryn [name] 02/19/23 9383    Kathryn Norris, MD 02/20/23 [phone]

## 2023-02-19 NOTE — ED Notes (Signed)
 The rn on the floor has called and accepted the pt

## 2023-02-19 NOTE — Consult Note (Signed)
 Chief Complaint   Chief Complaint  Patient presents with   Back Pain    History of Present Illness  Kathryn [name] is a 83 y.o. female presenting to the hospital with 2 days of severe exacerbation of more chronic back and leg pain.  Patient reports she has dealt with some back pain and sciatica for several years.  She was actually managed by Dr. Josepha on an outpatient basis and reports having undergone previous epidural steroid injection as well as what sounds like medial branch RFA.  Unfortunately she reports no significant improvement in her back or leg pain with these procedures previously.  Unfortunately, about 2 days ago she noted severe exacerbation of her chronic back pain.  She now describes severe pain with radiation through the posterior aspect of the right greater than left leg.  She does report some paresthesias in both feet.  Due to the pain, she has been unable to walk for the last 2 days therefore prompting her presentation to the ED. patient does not report any urinary incontinence, was able to void here in the ED without any subjective complaint of urinary retention.  Of note, the patient does report a history of hypertension.  No history of diabetes.  She reports a heart attack about 5 or 6 years ago at which time she underwent stent angioplasty.  She is currently maintained on a baby aspirin .  She denies any history of lung or liver disease.  She does have a history of chronic kidney disease.  No cancer history.  She is not on any anticoagulation.  She is a non-smoker.  Past Medical History   Past Medical History:  Diagnosis Date   Anxiety    takes Xanax  bid prn   Arthritis    Bruises easily    pt is on Plavix    Chronic back pain    buldging disc   Chronic neck pain    Coronary artery disease    1 stent    Dysphagia    Headache(784.0)    related to cervical issues   History of colonic polyps    Hyperlipidemia    takes Crestor  daily   Hypertension    takes  Coreg  and Diovan  daily   Myocardial infarction (HCC) 2019   Osteoporosis    was taking shot 2xyr;but not taking anymore   Peripheral vascular disease (HCC)    Sciatica    Seasonal allergies    takes Zyrtec nightly    Past Surgical History   Past Surgical History:  Procedure Laterality Date   ABDOMINAL HYSTERECTOMY  70's   BUNIONECTOMY     left foot   CARDIAC CATHETERIZATION  2009/2011   1 stent placed   CATARACT EXTRACTION Bilateral    CHOLECYSTECTOMY  2009   COLONOSCOPY     ESOPHAGOGASTRODUODENOSCOPY     LEFT HEART CATH AND CORONARY ANGIOGRAPHY N/A 01/16/2018   Procedure: LEFT HEART CATH AND CORONARY ANGIOGRAPHY;  Surgeon: Elmira Newman PARAS, MD;  Location: [facility];  Service: Cardiovascular;  Laterality: N/A;   TONSILLECTOMY     at age 57    Social History   Social History   Tobacco Use   Smoking status: Former    Current packs/day: 0.00    Average packs/day: 0.3 packs/day for 5.0 years (1.3 ttl pk-yrs)    Types: Cigarettes    Start date: 11/28/1963    Quit date: 11/27/1968    Years since quitting: 54.2    Passive exposure: Never  Smokeless tobacco: Never  Vaping Use   Vaping status: Never Used  Substance Use Topics   Alcohol use: No   Drug use: No    Medications   Prior to Admission medications   Medication Sig Start Date End Date Taking? Authorizing Provider  acetaminophen  (TYLENOL ) 325 MG tablet Take 325 mg by mouth every 6 (six) hours as needed for mild pain.   Yes [provider]  ALPRAZolam  (XANAX ) 0.25 MG tablet TAKE ONE TABLET BY MOUTH EVERY EVENING 02/06/23  Yes Jarold Medici, MD  amLODipine  (NORVASC ) 10 MG tablet TAKE ONE TABLET BY MOUTH ONCE DAILY. 12/08/22  Yes Jarold Medici, MD  Apoaequorin (PREVAGEN PO) Take 1 tablet by mouth daily.   Yes [provider]  Aspirin  81 MG CAPS    Yes [provider]  CALCIUM  PO Take 1 tablet by mouth daily. 600 mg   Yes [provider]  carvedilol  (COREG ) 12.5  MG tablet TAKE (1) TABLET BY MOUTH TWICE DAILY. 09/08/22  Yes Ladona Heinz, MD  cetirizine (ZYRTEC) 10 MG tablet Take 10 mg by mouth at bedtime.   Yes [provider]  denosumab  (PROLIA ) 60 MG/ML SOSY injection Inject 60 mg into the skin every 6 (six) months. 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[address], [address], [place] [address].Appt on 08/03/22 07/25/22  Yes Cheryl Waddell HERO, PA-C  diclofenac  Sodium (VOLTAREN ) 1 % GEL Apply 4 g topically 4 (four) times daily. 01/28/21  Yes Stover, Titorya, DPM  ezetimibe  (ZETIA ) 10 MG tablet TAKE 1 TABLET BY MOUTH ONCE A DAY. 04/21/22  Yes Custovic, Sabina, DO  famotidine  (PEPCID ) 20 MG tablet TAKE ONE TABLET BY MOUTH ONCE DAILY. 09/13/22  Yes Jarold Medici, MD  fluticasone  (FLONASE ) 50 MCG/ACT nasal spray Place 2 sprays into both nostrils daily. 09/28/21  Yes Leath-Warren, Etta PARAS, NP  losartan  (COZAAR ) 25 MG tablet Take 1 tablet (25 mg total) by mouth daily. 01/31/23 01/31/24 Yes Jarold Medici, MD  Multiple Vitamin (MULTIVITAMIN WITH MINERALS) TABS Take 1 tablet by mouth daily.   Yes [provider]  nitroGLYCERIN  (NITROSTAT ) 0.4 MG SL tablet Place 1 tablet (0.4 mg total) under the tongue every 5 (five) minutes x 3 doses as needed for chest pain. 04/29/22  Yes Ladona Heinz, MD  pravastatin  (PRAVACHOL ) 40 MG tablet Take 1 tablet (40 mg total) by mouth every evening. 01/05/23 04/05/23 Yes Jarold Medici, MD  Probiotic Product (PROBIOTIC DAILY PO) Take 1 tablet by mouth daily at 12 noon.   Yes [provider]  sertraline  (ZOLOFT ) 25 MG tablet TAKE 1 TABLET BY MOUTH DAILY. 09/26/22  Yes Jarold Medici, MD  tiZANidine  (ZANAFLEX ) 4 MG tablet TAKE 1 TABLET BY MOUTH ONCE AT BEDTIME AS NEEDED FOR MUSCLE SPASMS. 09/22/22  Yes Jarold Medici, MD  traMADol  (ULTRAM ) 50 MG tablet Take 50 mg by mouth every 8 (eight) hours as needed for moderate pain (pain score 4-6). 02/13/23  Yes [provider]  triamcinolone  cream (KENALOG ) 0.1 % Apply 1 Application  topically 2 (two) times daily. Patient taking differently: Apply 1 Application topically as needed. 08/26/21  Yes Georgina Speaks, FNP  furosemide  (LASIX ) 20 MG tablet TAKE 1 TABLET BY MOUTH DAILY Patient not taking: Reported on 12/12/2022 02/09/21   Cantwell, Celeste C, PA-C  pregabalin  (LYRICA ) 75 MG capsule TAKE 1 CAPSULE BY MOUTH THREE TIMES A DAY. Patient not taking: Reported on 02/19/2023 10/20/22   Jarold Medici, MD    Allergies   Allergies  Allergen Reactions   Codeine Hypertension  Ibuprofen Other (See Comments)    Makes sick on stomach    Review of Systems  ROS  Neurologic Exam  Awake, alert, oriented Memory and concentration grossly intact Speech fluent, appropriate CN grossly intact Motor exam: Upper Extremities Deltoid Bicep Tricep Grip  Right 5/5 5/5 5/5 5/5  Left 5/5 5/5 5/5 5/5   Lower Extremities IP Quad PF DF EHL  Right 5/5 5/5 5/5 5/5 5/5  Left 5/5 5/5 5/5 5/5 5/5   Sensation grossly intact to LT  Imaging  MRI of the lumbar spine dated 02/19/2023 was personally reviewed.  This demonstrates primary finding at L4-5 where there is grade 1 degenerative anterolisthesis.  There appears to be a large central disc herniation with severe central stenosis and near complete effacement of the thecal sac.  Impression  - 83 y.o. 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  Plan  -I have recommended we proceed with L4-5 laminotomy and microdiscectomy, tentatively scheduled tomorrow 3p. -N.p.o. after midnight -Appreciate TRH assistance  I have reviewed the situation with the patient and her family including MRI findings.  We have discussed treatment options including continued conservative treatment versus the option for operative decompression especially given the fact that this is a acute exacerbation of a more chronic problem and her previous attempts at injection.   I did review with them the details of the operation and the expected postoperative course and recovery.  We did review the risks of the procedure to include risk of nerve root injury leading to foot or bladder dysfunction, risk of infection, bleeding, and CSF leak.  We discussed the potential need for additional spinal surgery in the future.  We discussed the expected postoperative course and the likelihood of improvement in pain symptoms.  All their questions today were answered.   Gerldine Maizes, MD [facility]

## 2023-02-19 NOTE — ED Notes (Signed)
 Pt daughter just left to go home because of the weather

## 2023-02-19 NOTE — ED Notes (Signed)
 ED TO INPATIENT HANDOFF REPORT  ED Nurse Name and Phone #: chris [phone]  S Name/Age/Gender Kathryn [name] 83 y.o. female Room/Bed: 044C/044C  Code Status   Code Status: Full Code  Home/SNF/Other Home Patient oriented to: self, place, time, and situation Is this baseline? Yes   Triage Complete: Triage complete  Chief Complaint Protrusion of lumbar intervertebral disc [M51.26]  Triage Note Please note that is was PTAR who brought her here from home. She c/o low back pain real bad for about three weeks; and worse for the past two days. She states this low back pain radiates equally into both right and left hip/legs. She denies any recent trauma. She states she does have a hx of sciatica. Two daughters are with her.   Allergies Allergies  Allergen Reactions   Codeine Hypertension   Ibuprofen Other (See Comments)    Makes sick on stomach    Level of Care/Admitting Diagnosis ED Disposition     ED Disposition  Admit   Condition  --   Comment  Hospital Area: [facility] [[id number]]  Level of Care: Telemetry Medical [104]  May admit patient to [facility] or [facility] if equivalent level of care is available:: No  Covid Evaluation: Asymptomatic - no recent exposure (last 10 days) testing not required  Diagnosis: Protrusion of lumbar intervertebral disc [8455194]  Admitting Physician: SUNDIL, SUBRINA [[id number]]  Attending Physician: SUNDIL, SUBRINA [[id number]]  Certification:: I certify this patient will need inpatient services for at least 2 midnights  Expected Medical Readiness: 02/24/2023          B Medical/Surgery History Past Medical History:  Diagnosis Date   Anxiety    takes Xanax  bid prn   Arthritis    Bruises easily    pt is on Plavix    Chronic back pain    buldging disc   Chronic neck pain    Coronary artery disease    1 stent    Dysphagia    Headache(784.0)    related to cervical issues   History of colonic polyps     Hyperlipidemia    takes Crestor  daily   Hypertension    takes Coreg  and Diovan  daily   Myocardial infarction (HCC) 2019   Osteoporosis    was taking shot 2xyr;but not taking anymore   Peripheral vascular disease (HCC)    Sciatica    Seasonal allergies    takes Zyrtec nightly   Past Surgical History:  Procedure Laterality Date   ABDOMINAL HYSTERECTOMY  70's   BUNIONECTOMY     left foot   CARDIAC CATHETERIZATION  2009/2011   1 stent placed   CATARACT EXTRACTION Bilateral    CHOLECYSTECTOMY  2009   COLONOSCOPY     ESOPHAGOGASTRODUODENOSCOPY     LEFT HEART CATH AND CORONARY ANGIOGRAPHY N/A 01/16/2018   Procedure: LEFT HEART CATH AND CORONARY ANGIOGRAPHY;  Surgeon: Elmira Newman PARAS, MD;  Location: [facility];  Service: Cardiovascular;  Laterality: N/A;   TONSILLECTOMY     at age 30     A IV Location/Drains/Wounds Patient Lines/Drains/Airways Status     Active Line/Drains/Airways     Name Placement date Placement time Site Days   Peripheral IV 02/18/23 20 G Left Antecubital 02/18/23  1959  Antecubital  1   External Urinary Catheter 02/18/23  1917  --  1            Intake/Output Last 24 hours No intake or output data in the 24 hours  ending 02/19/23 1720  Labs/Imaging Results for orders placed or performed during the hospital encounter of 02/18/23 (from the past 48 hours)  CBC     Status: Abnormal   Collection Time: 02/18/23  6:41 PM  Result Value Ref Range   WBC 8.6 4.0 - 10.5 K/uL   RBC 5.08 3.87 - 5.11 MIL/uL   Hemoglobin 12.0 12.0 - 15.0 g/dL   HCT 62.4 63.9 - 53.9 %   MCV 73.8 (L) 80.0 - 100.0 fL   MCH 23.6 (L) 26.0 - 34.0 pg   MCHC 32.0 30.0 - 36.0 g/dL   RDW 84.9 88.4 - 84.4 %   Platelets 418 (H) 150 - 400 K/uL   nRBC 0.0 0.0 - 0.2 %    Comment: Performed at [facility], [address] [place], [place] [address]  Basic metabolic panel     Status: Abnormal   Collection Time: 02/18/23  6:41 PM  Result Value Ref Range    Sodium 143 135 - 145 mmol/L   Potassium 3.5 3.5 - 5.1 mmol/L   Chloride 106 98 - 111 mmol/L   CO2 27 22 - 32 mmol/L   Glucose, Bld 79 70 - 99 mg/dL    Comment: Glucose reference range applies only to samples taken after fasting for at least 8 hours.   BUN 9 8 - 23 mg/dL   Creatinine, Ser 9.16 0.44 - 1.00 mg/dL   Calcium  8.5 (L) 8.9 - 10.3 mg/dL   GFR, Estimated >39 >39 mL/min    Comment: (NOTE) Calculated using the CKD-EPI Creatinine Equation (2021)    Anion gap 10 5 - 15    Comment: Performed at [facility], [address], [place], [place] [address]   MR LUMBAR SPINE WO CONTRAST Result Date: 02/19/2023 CLINICAL DATA:  Low back pain, cauda equina syndrome suspected EXAM: MRI LUMBAR SPINE WITHOUT CONTRAST TECHNIQUE: Multiplanar, multisequence MR imaging of the lumbar spine was performed. No intravenous contrast was administered. COMPARISON:  None Available. FINDINGS: Segmentation: Standard segmentation is assumed. The inferior-most fully formed intervertebral disc labeled L5-S1. Alignment: Grade 1 anterolisthesis of L4 on L5. Vertebrae: Degenerative/discogenic endplate signal changes at L4-L5 and T12-L1. No specific evidence of acute fracture or discitis/osteomyelitis. No suspicious bone lesions. Conus medullaris and cauda equina: Conus extends to the L1-L2 level. Conus appears normal. Paraspinal and other soft tissues: Atrophic right kidney, smaller than on the 2021 prior. Disc levels: T12-L1: Mild disc bulging with mild left foraminal stenosis, similar. No significant canal or right foraminal stenosis. L1-L2: Disc bulging and right greater than left facet arthropathy. Similar mild right greater than left foraminal stenosis. Mild canal stenosis. L2-L3: Mild disc bulging. Ligamentum flavum thickening facet arthropathy. Similar moderate right and mild left foraminal stenosis. Similar mild canal stenosis L3-L4: Disc bulging with bulky ligamentum flavum thickening. Bilateral facet  arthropathy. Resulting mild to moderate right foraminal stenosis with progressive mild to moderate canal stenosis. Mild left foraminal stenosis. L4-L5: Grade 1 anterolisthesis. Uncovering the disc with superimposed superiorly directed large disc protrusion. Bulky ligamentum flavum thickening facet arthropathy. Resulting severe canal stenosis, which has progressed. Mild bilateral foraminal stenosis. L5-S1: Mild disc bulging. Moderate bilateral facet arthropathy. Similar moderate to severe left foraminal stenosis. Patent canal and right foramen. IMPRESSION: 1. At L4-L5, new large disc protrusion with progressive severe canal stenosis. 2. Milder multilevel degenerative changes are detailed above. 3. Atrophic right kidney, smaller than on the 2021 prior. Electronically Signed   By: Gilmore GORMAN Molt M.D.   On: 02/19/2023 03:38  CT Lumbar Spine Wo Contrast Result Date: 02/18/2023 CLINICAL DATA:  Low back pain, increased fracture risk EXAM: CT LUMBAR SPINE WITHOUT CONTRAST TECHNIQUE: Multidetector CT imaging of the lumbar spine was performed without intravenous contrast administration. Multiplanar CT image reconstructions were also generated. RADIATION DOSE REDUCTION: This exam was performed according to the departmental dose-optimization program which includes automated exposure control, adjustment of the mA and/or kV according to patient size and/or use of iterative reconstruction technique. COMPARISON:  None Available. FINDINGS: Segmentation: 5 lumbar type vertebrae. Alignment: Trace anterolisthesis of L3 on L4. Grade 1 anterolisthesis of L4 on L5. Vertebrae: No acute fracture or focal pathologic process. Paraspinal and other soft tissues: Diverticulosis without evidence of diverticulitis. Aortic atherosclerotic calcifications. Status post cholecystectomy. The right kidney is small in size. Left kidney is without evidence of hydronephrosis or nephrolithiasis. Disc levels: Moderate spinal canal narrowing at C3-C4  secondary to a combination of a disc bulge and ligamentum flavum hypertrophy. There is likely a relatively large intraspinal synovial cyst at the L4-L5 level on the left. There is likely severe spinal canal stenosis at this level. Recommend further evaluation with a lumbar spine MRI. IMPRESSION: 1. No acute fracture or traumatic listhesis. 2. 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 Recommend further evaluation with a lumbar spine MRI. Aortic Atherosclerosis (ICD10-I70.0). Electronically Signed   By: Lyndall Gore M.D.   On: 02/18/2023 19:15    Pending Labs Unresulted Labs (From admission, onward)     Start     Ordered   02/20/23 0500  CBC  Tomorrow morning,   R        02/19/23 0619   02/20/23 0500  Comprehensive metabolic panel  Tomorrow morning,   R        02/19/23 0619   02/19/23 0619  Protime-INR  Add-on,   AD        02/19/23 0619            Vitals/Pain Today's Vitals   02/19/23 1339 02/19/23 1449 02/19/23 1450 02/19/23 1500  BP:   (!) 146/105 (!) 142/51  Pulse:   90 84  Resp:   14 19  Temp: 98.2 F (36.8 C)     TempSrc: Oral     SpO2:   99% 94%  PainSc:  8       Isolation Precautions No active isolations  Medications Medications  amLODipine  (NORVASC ) tablet 10 mg (has no administration in time range)  carvedilol  (COREG ) tablet 12.5 mg (has no administration in time range)  ezetimibe  (ZETIA ) tablet 10 mg (10 mg Oral Given 02/19/23 1014)  pravastatin  (PRAVACHOL ) tablet 40 mg (has no administration in time range)  ALPRAZolam  (XANAX ) tablet 0.25 mg (has no administration in time range)  sertraline  (ZOLOFT ) tablet 25 mg (25 mg Oral Given 02/19/23 1013)  famotidine  (PEPCID ) tablet 20 mg (20 mg Oral Given 02/19/23 1014)  pregabalin  (LYRICA ) capsule 75 mg (75 mg Oral Given 02/19/23 1015)  tiZANidine  (ZANAFLEX ) tablet 4 mg (has no administration in time range)   acetaminophen  (TYLENOL ) tablet 650 mg (has no administration in time range)    Or  acetaminophen  (TYLENOL ) suppository 650 mg (has no administration in time range)  fentaNYL  (SUBLIMAZE ) injection 12.5-25 mcg (has no administration in time range)  HYDROcodone -acetaminophen  (NORCO/VICODIN) 5-325 MG per tablet 1-2 tablet (1 tablet Oral Given 02/19/23 1447)  ondansetron  (ZOFRAN ) tablet 4 mg (has no administration in time range)  Or  ondansetron  (ZOFRAN ) injection 4 mg (has no administration in time range)  senna-docusate (Senokot-S) tablet 1 tablet (has no administration in time range)  losartan  (COZAAR ) tablet 25 mg (has no administration in time range)  levalbuterol  (XOPENEX ) nebulizer solution 0.63 mg (has no administration in time range)  ferrous sulfate  tablet 325 mg (325 mg Oral Given 02/19/23 1016)  senna-docusate (Senokot-S) tablet 1 tablet (1 tablet Oral Given 02/19/23 1014)  fentaNYL  (SUBLIMAZE ) injection 50 mcg (50 mcg Intravenous Given 02/18/23 1836)  amLODipine  (NORVASC ) tablet 10 mg (10 mg Oral Given 02/18/23 1839)  HYDROmorphone  (DILAUDID ) injection 0.5 mg (0.5 mg Intravenous Given 02/18/23 2006)  LORazepam  (ATIVAN ) injection 0.5 mg (0.5 mg Intravenous Given 02/18/23 2100)  LORazepam  (ATIVAN ) injection 0.5 mg (0.5 mg Intravenous Given 02/19/23 0250)  HYDROmorphone  (DILAUDID ) injection 0.5 mg (0.5 mg Intravenous Given 02/19/23 0254)    Mobility walks with device     Focused Assessments Cardiac Assessment Handoff:  Cardiac Rhythm: Normal sinus rhythm Lab Results  Component Value Date   CKTOTAL 119 01/24/2008   CKMB 3.7 01/24/2008   TROPONINI 0.14 (HH) 01/15/2018   No results found for: DDIMER Does the Patient currently have chest pain? No    R Recommendations: See Admitting Provider Note  Report given to:   Additional Notes:

## 2023-02-19 NOTE — H&P (Addendum)
 History and Physical    Kathryn [name] FMW:[id number] DOB: 05-23-1940 DOA: 02/18/2023  PCP: Jarold Medici, MD   Patient coming from: Home   Chief Complaint:  Chief Complaint  Patient presents with   Back Pain   ED TRIAGE note:  Please note that is was PTAR who brought her here from home. She c/o low back pain real bad for about three weeks; and worse for the past two days. She states this low back pain radiates equally into both right and left hip/legs. She denies any recent trauma. She states she does have a hx of sciatica. Two daughters are with her.      HPI:   83 year old female history of CAD status post PCI 2009, essential hypertension, statin intolerance, CKD stage III, bilateral carotid stenosis, generalized anxiety disorder, hyperlipidemia, essential hypertension, peripheral vascular disease and chronic lower back pain presented to emergency departmen complaining of back pain for 3 weeks and worsened over the last 2 days.  Patient went to urgent care 12/30 or x-ray of the thoracic and lumbar spine were normal and was discharged with tramadol  as needed.  However patient reported continued to pain radiate to her right sided leg.  Due to the pain she has limiting movement and ambulation.  Patient reported she noticed bowel and bladder incontinence for last few weeks.  Denies any saddle anesthesia.  No loss of bowel and bladder and saddle paresthesia.  No urinary symptoms.  No tingling and numbness. Patient does not have any other complaint at this.  At presentation to ED patient found hypertensive which improved otherwise hemodynamically stable. CMP showed low calcium  8.5 otherwise unremarkable. CBC showed low MCV 73 elevated platelet count 418.  Normal WBC count 8.6.  CT lumbar spine no fracture.  It showed L4-L5 level on the left with associated severe spinal canal stenosis at this level. Recommend further evaluation with a lumbar spine MRI.  Following MRI of the lumbar  spine showed: 1. At L4-L5, new large disc protrusion with progressive severe canal stenosis. 2. Milder multilevel degenerative changes are detailed above. 3. Atrophic right kidney, smaller than on the 2021 prior.  ED physician consulted neurosurgery. Dr. Lanis. He requests medicine admission. Will formally consult in AM with consideration of possible microdiscectomy.  In the ED patient has been treated with Dilaudid  multiple doses, fentanyl  due to severe back pain.  In the ED patient also received amlodipine  10 mg with that blood pressure has been improved significantly as well as after pain control blood pressure has been improved as well.   Hospitalist has been contacted for further evaluation management of back pain and hypertensive urgency.   Significant labs in the ED: Lab Orders         CBC         Basic metabolic panel         Protime-INR         CBC         Comprehensive metabolic panel       Review of Systems:  Review of Systems  Constitutional:  Negative for chills, fever, malaise/fatigue and weight loss.  Respiratory:  Negative for cough and shortness of breath.   Cardiovascular:  Negative for chest pain and palpitations.  Gastrointestinal:  Negative for abdominal pain, constipation, diarrhea, heartburn and nausea.       Bowel incontinence  Genitourinary:  Positive for frequency. Negative for dysuria and urgency.  Musculoskeletal:  Positive for back pain. Negative for falls, joint pain, myalgias and neck  pain.  Neurological:  Negative for dizziness and headaches.  Endo/Heme/Allergies:  Does not bruise/bleed easily.  Psychiatric/Behavioral:  The patient is not nervous/anxious.   All other systems reviewed and are negative.   Past Medical History:  Diagnosis Date   Anxiety    takes Xanax  bid prn   Arthritis    Bruises easily    pt is on Plavix    Chronic back pain    buldging disc   Chronic neck pain    Coronary artery disease    1 stent    Dysphagia     Headache(784.0)    related to cervical issues   History of colonic polyps    Hyperlipidemia    takes Crestor  daily   Hypertension    takes Coreg  and Diovan  daily   Myocardial infarction (HCC) 2019   Osteoporosis    was taking shot 2xyr;but not taking anymore   Peripheral vascular disease (HCC)    Sciatica    Seasonal allergies    takes Zyrtec nightly    Past Surgical History:  Procedure Laterality Date   ABDOMINAL HYSTERECTOMY  70's   BUNIONECTOMY     left foot   CARDIAC CATHETERIZATION  2009/2011   1 stent placed   CATARACT EXTRACTION Bilateral    CHOLECYSTECTOMY  2009   COLONOSCOPY     ESOPHAGOGASTRODUODENOSCOPY     LEFT HEART CATH AND CORONARY ANGIOGRAPHY N/A 01/16/2018   Procedure: LEFT HEART CATH AND CORONARY ANGIOGRAPHY;  Surgeon: Elmira Newman PARAS, MD;  Location: [facility];  Service: Cardiovascular;  Laterality: N/A;   TONSILLECTOMY     at age 52     reports that she quit smoking about 54 years ago. Her smoking use included cigarettes. She started smoking about 59 years ago. She has a 1.3 pack-year smoking history. She has never been exposed to tobacco smoke. She has never used smokeless tobacco. She reports that she does not drink alcohol and does not use drugs.  Allergies  Allergen Reactions   Codeine Hypertension   Ibuprofen Other (See Comments)    Makes sick on stomach    Family History  Problem Relation Age of Onset   Heart disease Mother        Heart Dissease before age 34   Heart attack Mother    Cancer Father    Leukemia Sister    Cancer Sister    Lupus Sister    Cancer Brother    Heart disease Brother        Amputation   Heart attack Brother    Heart disease Brother    Heart attack Brother    Dementia Other    Healthy Son    Healthy Daughter    Healthy Daughter    Healthy Daughter    Anesthesia problems Neg Hx    Hypotension Neg Hx    Malignant hyperthermia Neg Hx    Pseudochol deficiency Neg Hx     Prior to Admission  medications   Medication Sig Start Date End Date Taking? Authorizing Provider  acetaminophen  (TYLENOL ) 325 MG tablet Take 325 mg by mouth every 6 (six) hours as needed for mild pain.    [provider]  albuterol  (VENTOLIN  HFA) 108 (90 Base) MCG/ACT inhaler Inhale 2 puffs into the lungs every 4 (four) hours as needed for wheezing or shortness of breath. Patient not taking: Reported on 12/12/2022 08/21/22   Teresa Shelba SAUNDERS, NP  ALPRAZolam  (XANAX ) 0.25 MG tablet TAKE ONE TABLET BY MOUTH EVERY EVENING  02/06/23   Jarold Medici, MD  amLODipine  (NORVASC ) 10 MG tablet TAKE ONE TABLET BY MOUTH ONCE DAILY. 12/08/22   Jarold Medici, MD  Apoaequorin (PREVAGEN PO) Take 1 tablet by mouth daily.    [provider]  Aspirin  81 MG CAPS     [provider]  CALCIUM  PO Take 1 tablet by mouth daily. 600 mg    [provider]  carvedilol  (COREG ) 12.5 MG tablet TAKE (1) TABLET BY MOUTH TWICE DAILY. 09/08/22   Ladona Heinz, MD  cetirizine (ZYRTEC) 10 MG tablet Take 10 mg by mouth at bedtime.    [provider]  denosumab  (PROLIA ) 60 MG/ML SOSY injection Inject 60 mg into the skin every 6 (six) months. 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[address], [place] [address].Appt on 08/03/22 07/25/22   Cheryl Waddell HERO, PA-C  diclofenac  Sodium (VOLTAREN ) 1 % GEL Apply 4 g topically 4 (four) times daily. 01/28/21   Stover, Titorya, DPM  doxycycline  (VIBRA -TABS) 100 MG tablet Take 1 tablet (100 mg total) by mouth 2 (two) times daily. 12/14/22   Jarold Medici, MD  ezetimibe  (ZETIA ) 10 MG tablet TAKE 1 TABLET BY MOUTH ONCE A DAY. 04/21/22   Custovic, Sabina, DO  famotidine  (PEPCID ) 20 MG tablet TAKE ONE TABLET BY MOUTH ONCE DAILY. 09/13/22   Jarold Medici, MD  fluticasone  (FLONASE ) 50 MCG/ACT nasal spray Place 2 sprays into both nostrils daily. Patient not taking: Reported on 12/12/2022 09/28/21   Leath-Warren, Etta PARAS, NP  furosemide  (LASIX ) 20 MG tablet TAKE 1 TABLET BY MOUTH  DAILY Patient not taking: Reported on 12/12/2022 02/09/21   Cantwell, Sherran C, PA-C  losartan  (COZAAR ) 25 MG tablet Take 1 tablet (25 mg total) by mouth daily. 01/31/23 01/31/24  Jarold Medici, MD  Multiple Vitamin (MULTIVITAMIN WITH MINERALS) TABS Take 1 tablet by mouth daily.    [provider]  nitroGLYCERIN  (NITROSTAT ) 0.4 MG SL tablet Place 1 tablet (0.4 mg total) under the tongue every 5 (five) minutes x 3 doses as needed for chest pain. 04/29/22   Ladona Heinz, MD  pravastatin  (PRAVACHOL ) 40 MG tablet Take 1 tablet (40 mg total) by mouth every evening. 01/05/23 04/05/23  Jarold Medici, MD  pregabalin  (LYRICA ) 75 MG capsule TAKE 1 CAPSULE BY MOUTH THREE TIMES A DAY. 10/20/22   Jarold Medici, MD  Probiotic Product (PROBIOTIC DAILY PO) Take 1 tablet by mouth daily at 12 noon.    [provider]  sertraline  (ZOLOFT ) 25 MG tablet TAKE 1 TABLET BY MOUTH DAILY. 09/26/22   Jarold Medici, MD  tiZANidine  (ZANAFLEX ) 4 MG tablet TAKE 1 TABLET BY MOUTH ONCE AT BEDTIME AS NEEDED FOR MUSCLE SPASMS. 09/22/22   Jarold Medici, MD  triamcinolone  cream (KENALOG ) 0.1 % Apply 1 Application topically 2 (two) times daily. Patient taking differently: Apply 1 Application topically as needed. 08/26/21   Georgina Speaks, FNP     Physical Exam: Vitals:   02/18/23 2154 02/18/23 2157 02/19/23 0218 02/19/23 0549  BP: (!) 171/68  (!) 165/61 114/61  Pulse: 97  91 92  Resp: 17  18 18   Temp: 97.9 F (36.6 C)  98.1 F (36.7 C) 97.9 F (36.6 C)  TempSrc: Oral  Oral Oral  SpO2: 100% 100% 97% 98%    Physical Exam Constitutional:      General: She is not in acute distress.    Appearance: She is not ill-appearing.  HENT:     Mouth/Throat:     Mouth: Mucous membranes are moist.  Eyes:  Conjunctiva/sclera: Conjunctivae normal.  Cardiovascular:     Rate and Rhythm: Normal rate and regular rhythm.     Pulses: Normal pulses.     Heart sounds: Normal heart sounds.  Pulmonary:     Effort: Pulmonary  effort is normal.     Breath sounds: Normal breath sounds.  Abdominal:     General: Bowel sounds are normal.  Musculoskeletal:        General: No swelling.     Cervical back: Neck supple.     Right lower leg: No edema.     Left lower leg: No edema.     Comments: Midline lumbar area pain 7 out of 10 intensity on palpation.  Skin:    Capillary Refill: Capillary refill takes less than 2 seconds.  Neurological:     Mental Status: She is oriented to person, place, and time.  Psychiatric:        Mood and Affect: Mood normal.      Labs on Admission: I have personally reviewed following labs and imaging studies  CBC: Recent Labs  Lab 02/18/23 1841  WBC 8.6  HGB 12.0  HCT 37.5  MCV 73.8*  PLT 418*   Basic Metabolic Panel: Recent Labs  Lab 02/18/23 1841  NA 143  K 3.5  CL 106  CO2 27  GLUCOSE 79  BUN 9  CREATININE 0.83  CALCIUM  8.5*   GFR: CrCl cannot be calculated (Unknown ideal weight.). Liver Function Tests: No results for input(s): AST, ALT, ALKPHOS, BILITOT, PROT, ALBUMIN  in the last 168 hours. No results for input(s): LIPASE, AMYLASE in the last 168 hours. No results for input(s): AMMONIA in the last 168 hours. Coagulation Profile: No results for input(s): INR, PROTIME in the last 168 hours. Cardiac Enzymes: No results for input(s): CKTOTAL, CKMB, CKMBINDEX, TROPONINI, TROPONINIHS in the last 168 hours. BNP (last 3 results) No results for input(s): BNP in the last 8760 hours. HbA1C: No results for input(s): HGBA1C in the last 72 hours. CBG: No results for input(s): GLUCAP in the last 168 hours. Lipid Profile: No results for input(s): CHOL, HDL, LDLCALC, TRIG, CHOLHDL, LDLDIRECT in the last 72 hours. Thyroid  Function Tests: No results for input(s): TSH, T4TOTAL, FREET4, T3FREE, THYROIDAB in the last 72 hours. Anemia Panel: No results for input(s): VITAMINB12, FOLATE, FERRITIN, TIBC, IRON,  RETICCTPCT in the last 72 hours. Urine analysis:    Component Value Date/Time   COLORURINE STRAW (A) 01/14/2018 1500   APPEARANCEUR CLEAR 01/14/2018 1500   LABSPEC 1.005 01/14/2018 1500   PHURINE 6.0 01/14/2018 1500   GLUCOSEU NEGATIVE 01/14/2018 1500   HGBUR SMALL (A) 01/14/2018 1500   BILIRUBINUR negative 03/23/2022 1647   KETONESUR NEGATIVE 01/14/2018 1500   PROTEINUR Positive (A) 03/23/2022 1647   PROTEINUR NEGATIVE 01/14/2018 1500   UROBILINOGEN 0.2 03/23/2022 1647   UROBILINOGEN 0.2 03/08/2011 0931   NITRITE negative 03/23/2022 1647   NITRITE NEGATIVE 01/14/2018 1500   LEUKOCYTESUR Negative 03/23/2022 1647    Radiological Exams on Admission: I have personally reviewed images MR LUMBAR SPINE WO CONTRAST Result Date: 02/19/2023 CLINICAL DATA:  Low back pain, cauda equina syndrome suspected EXAM: MRI LUMBAR SPINE WITHOUT CONTRAST TECHNIQUE: Multiplanar, multisequence MR imaging of the lumbar spine was performed. No intravenous contrast was administered. COMPARISON:  None Available. FINDINGS: Segmentation: Standard segmentation is assumed. The inferior-most fully formed intervertebral disc labeled L5-S1. Alignment: Grade 1 anterolisthesis of L4 on L5. Vertebrae: Degenerative/discogenic endplate signal changes at L4-L5 and T12-L1. No specific evidence of acute fracture or discitis/osteomyelitis.  No suspicious bone lesions. Conus medullaris and cauda equina: Conus extends to the L1-L2 level. Conus appears normal. Paraspinal and other soft tissues: Atrophic right kidney, smaller than on the 2021 prior. Disc levels: T12-L1: Mild disc bulging with mild left foraminal stenosis, similar. No significant canal or right foraminal stenosis. L1-L2: Disc bulging and right greater than left facet arthropathy. Similar mild right greater than left foraminal stenosis. Mild canal stenosis. L2-L3: Mild disc bulging. Ligamentum flavum thickening facet arthropathy. Similar moderate right and mild left foraminal  stenosis. Similar mild canal stenosis L3-L4: Disc bulging with bulky ligamentum flavum thickening. Bilateral facet arthropathy. Resulting mild to moderate right foraminal stenosis with progressive mild to moderate canal stenosis. Mild left foraminal stenosis. L4-L5: Grade 1 anterolisthesis. Uncovering the disc with superimposed superiorly directed large disc protrusion. Bulky ligamentum flavum thickening facet arthropathy. Resulting severe canal stenosis, which has progressed. Mild bilateral foraminal stenosis. L5-S1: Mild disc bulging. Moderate bilateral facet arthropathy. Similar moderate to severe left foraminal stenosis. Patent canal and right foramen. IMPRESSION: 1. At L4-L5, new large disc protrusion with progressive severe canal stenosis. 2. Milder multilevel degenerative changes are detailed above. 3. Atrophic right kidney, smaller than on the 2021 prior. Electronically Signed   By: Gilmore GORMAN Molt M.D.   On: 02/19/2023 03:38   CT Lumbar Spine Wo Contrast Result Date: 02/18/2023 CLINICAL DATA:  Low back pain, increased fracture risk EXAM: CT LUMBAR SPINE WITHOUT CONTRAST TECHNIQUE: Multidetector CT imaging of the lumbar spine was performed without intravenous contrast administration. Multiplanar CT image reconstructions were also generated. RADIATION DOSE REDUCTION: This exam was performed according to the departmental dose-optimization program which includes automated exposure control, adjustment of the mA and/or kV according to patient size and/or use of iterative reconstruction technique. COMPARISON:  None Available. FINDINGS: Segmentation: 5 lumbar type vertebrae. Alignment: Trace anterolisthesis of L3 on L4. Grade 1 anterolisthesis of L4 on L5. Vertebrae: No acute fracture or focal pathologic process. Paraspinal and other soft tissues: Diverticulosis without evidence of diverticulitis. Aortic atherosclerotic calcifications. Status post cholecystectomy. The right kidney is small in size. Left kidney  is without evidence of hydronephrosis or nephrolithiasis. Disc levels: Moderate spinal canal narrowing at C3-C4 secondary to a combination of a disc bulge and ligamentum flavum hypertrophy. There is likely a relatively large intraspinal synovial cyst at the L4-L5 level on the left. There is likely severe spinal canal stenosis at this level. Recommend further evaluation with a lumbar spine MRI. IMPRESSION: 1. No acute fracture or traumatic listhesis. 2. 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 Recommend further evaluation with a lumbar spine MRI. Aortic Atherosclerosis (ICD10-I70.0). Electronically Signed   By: Lyndall Gore M.D.   On: 02/18/2023 19:15     EKG: Pending EKG    Assessment/Plan: Principal Problem:   Protrusion of lumbar intervertebral disc Active Problems:   Back pain   Essential hypertension   CKD (chronic kidney disease) stage 2, GFR 60-89 ml/min   Sciatica   History of CAD (coronary artery disease)   Carotid stenosis   Reactive airway disease   GAD (generalized anxiety disorder)    Assessment and Plan: Protrusion of the lumbar intervertebral disc Chronic back pain secondary to lumbar protrusion of the intervertebral disc > Patient presenting with chronic back pain which has been getting bad for last 3 weeks and progressively getting worse for last 2 days.  At the baseline patient ambulates with cane however  due to the back pain patient is unable to ambulate.  Patient also reported bowel and bladder incontinence for last few weeks. -At presentation to ED patient found hypertensive blood pressure improved.  CBC unremarkable and BMP unremarkable. -CT lumbar spine showed no acute fracture or traumatic listhesis.There is likely a relatively large intraspinal synovial cyst at the L4-L5 level on the left with associated severe spinal canal stenosis at this level. -MRI  lumbar spine showedAt L4-L5, new large disc protrusion with progressive severe canal stenosis. -Neurosurgery. Dr. Lanis has been consulted by ED physician, will do formal consult in the a.m. and.  Stated that possible consideration of  microdiscectomy. -Due to replating back pain continue pain control with Norco for mild pain and fentanyl  for severe pain. - Need to consult postoperatively PT and OT if patient undergoes to surgery. -Continue fall precaution   Essential hypertension Hypertensive urgency-resolved -Initial presentation to ED blood pressure 180/63.  Blood pressure improved after pain control and oral amlodipine .  Continue home blood pressure regimen amlodipine  10 mg daily, Coreg  12.5 mg twice daily and losartan  25 mg daily.  CKD stage II -Stable renal function.  Continue to monitor.  Avoid intoxications.  History of CAD -Continue Coreg , ezetimibe  and pravastatin .  Holding aspirin  as patient possibly will undergo lumbar microdiscectomy.  Asymptomatic bilateral carotid artery stenosis -Continue ezetimibe  and pravastatin .  Generalized anxiety disorder -Continue Xanax  0.25 mg at bedtime/evening  Reactive airway disease -Stable.  Continue to check pulse ox on Xopenex  as needed for wheezing shortness of breath  Chronic microcytic anemia -Stable H&H.  Low MCV 73. -Starting oral iron supplement   DVT prophylaxis:  SQ Heparin  Code Status:  Full Code Diet: Heart healthy diet Family Communication:   Family was present at bedside, at the time of interview. Opportunity was given to ask question and all questions were answered satisfactorily.  Disposition Plan: Pending formal evaluation by neurosurgery and possible surgical intervention Consults: Neurosurgery. Admission status:   Inpatient, Telemetry bed  Severity of Illness: The appropriate patient status for this patient is INPATIENT. Inpatient status is judged to be reasonable and necessary in order to provide the  required intensity of service to ensure the patient's safety. The patient's presenting symptoms, physical exam findings, and initial radiographic and laboratory data in the context of their chronic comorbidities is felt to place them at high risk for further clinical deterioration. Furthermore, it is not anticipated that the patient will be medically stable for discharge from the hospital within 2 midnights of admission.   * I certify that at the point of admission it is my clinical judgment that the patient will require inpatient hospital care spanning beyond 2 midnights from the point of admission due to high intensity of service, high risk for further deterioration and high frequency of surveillance required.DEWAINE    Yumalay Circle, MD [facility]  How to contact the [facility] Attending or Consulting provider 7A - 7P or covering provider during after hours 7P -7A, for this patient.  Check the care team in [facility] and look for a) attending/consulting [facility] provider listed and b) the [facility] team listed Log into [email or web address] and use [facility] universal password to access. If you do not have the password, please contact the hospital operator. Locate the [facility] provider you are looking for under [facility] and page to a number that you can be directly reached. If you still have difficulty reaching the provider, please page the [facility] (Director on Call) for the Hospitalists listed on amion for  assistance.  02/19/2023, 6:43 AM

## 2023-02-19 NOTE — ED Notes (Signed)
 The pt was just rejected

## 2023-02-19 NOTE — Progress Notes (Signed)
 PROGRESS NOTE    Kathryn [name]  FMW:[id number] DOB: July 26, 1940 DOA: 02/18/2023 PCP: Jarold Medici, MD  82/F with history of CAD, remote PCI in 2009, CKD 2, bilateral carotid artery stenosis, anxiety, hypertension, peripheral vascular disease, chronic back pain presented to the ED 1/5 with worsening low back pain for 3 weeks but acutely worse X 2 days with limited mobility.  In the ED hypertensive, labs largely unremarkable, MRI lumbar spine with large disc protrusion at L4-L5 with progressive severe canal stenosis   Subjective: -Continues to have fair amount of pain  Assessment and Plan:  Worsening low back pain Large lumbar disc protrusion at L4-L5 with severe canal stenosis -Continue Vicodin and fentanyl  for severe pain -Neurosurgery Dr. Lanis consulted overnight  Essential hypertension -Continue amlodipine , Coreg , losartan   CKD 2 -Stable  History of CAD Carotid artery disease -Remote PCI, stable continue Coreg  and statin, aspirin  on hold  History of anxiety -Xanax  resumed  Reactive airway disease Continue Xopenex  as needed  Chronic microcytic anemia Stable  DVT prophylaxis: SCDs Code Status: Full code Family Communication: Daughters at bedside Disposition Plan: To be determined  Consultants:    Procedures:   Antimicrobials:    Objective: Vitals:   02/19/23 0810 02/19/23 0830 02/19/23 0845 02/19/23 0943  BP:  (!) 164/59 (!) 171/62   Pulse: 93 89 91   Resp: (!) 22  20   Temp:    98.1 F (36.7 C)  TempSrc:    Oral  SpO2: 98% 94% 95%    No intake or output data in the 24 hours ending 02/19/23 0945 There were no vitals filed for this visit.  Examination:  General exam: Obese elderly female laying in bed, AAOx3 Respiratory system: Clear to auscultation Cardiovascular system: S1 & S2 heard, RRR.  Abd: nondistended, soft and nontender.Normal bowel sounds heard. Central nervous system: Alert and oriented. No focal neurological deficits.  Pain  with flexion at the hip Extremities: no edema Skin: No rashes Psychiatry:  Mood & affect appropriate.     Data Reviewed:   CBC: Recent Labs  Lab 02/18/23 1841  WBC 8.6  HGB 12.0  HCT 37.5  MCV 73.8*  PLT 418*   Basic Metabolic Panel: Recent Labs  Lab 02/18/23 1841  NA 143  K 3.5  CL 106  CO2 27  GLUCOSE 79  BUN 9  CREATININE 0.83  CALCIUM  8.5*   GFR: CrCl cannot be calculated (Unknown ideal weight.). Liver Function Tests: No results for input(s): AST, ALT, ALKPHOS, BILITOT, PROT, ALBUMIN  in the last 168 hours. No results for input(s): LIPASE, AMYLASE in the last 168 hours. No results for input(s): AMMONIA in the last 168 hours. Coagulation Profile: No results for input(s): INR, PROTIME in the last 168 hours. Cardiac Enzymes: No results for input(s): CKTOTAL, CKMB, CKMBINDEX, TROPONINI in the last 168 hours. BNP (last 3 results) No results for input(s): PROBNP in the last 8760 hours. HbA1C: No results for input(s): HGBA1C in the last 72 hours. CBG: No results for input(s): GLUCAP in the last 168 hours. Lipid Profile: No results for input(s): CHOL, HDL, LDLCALC, TRIG, CHOLHDL, LDLDIRECT in the last 72 hours. Thyroid  Function Tests: No results for input(s): TSH, T4TOTAL, FREET4, T3FREE, THYROIDAB in the last 72 hours. Anemia Panel: No results for input(s): VITAMINB12, FOLATE, FERRITIN, TIBC, IRON, RETICCTPCT in the last 72 hours. Urine analysis:    Component Value Date/Time   COLORURINE STRAW (A) 01/14/2018 1500   APPEARANCEUR CLEAR 01/14/2018 1500   LABSPEC 1.005 01/14/2018 1500  PHURINE 6.0 01/14/2018 1500   GLUCOSEU NEGATIVE 01/14/2018 1500   HGBUR SMALL (A) 01/14/2018 1500   BILIRUBINUR negative 03/23/2022 1647   KETONESUR NEGATIVE 01/14/2018 1500   PROTEINUR Positive (A) 03/23/2022 1647   PROTEINUR NEGATIVE 01/14/2018 1500   UROBILINOGEN 0.2 03/23/2022 1647   UROBILINOGEN 0.2  03/08/2011 0931   NITRITE negative 03/23/2022 1647   NITRITE NEGATIVE 01/14/2018 1500   LEUKOCYTESUR Negative 03/23/2022 1647   Sepsis Labs: @LABRCNTIP (procalcitonin:4,lacticidven:4)  )No results found for this or any previous visit (from the past 240 hours).   Radiology Studies: MR LUMBAR SPINE WO CONTRAST Result Date: 02/19/2023 CLINICAL DATA:  Low back pain, cauda equina syndrome suspected EXAM: MRI LUMBAR SPINE WITHOUT CONTRAST TECHNIQUE: Multiplanar, multisequence MR imaging of the lumbar spine was performed. No intravenous contrast was administered. COMPARISON:  None Available. FINDINGS: Segmentation: Standard segmentation is assumed. The inferior-most fully formed intervertebral disc labeled L5-S1. Alignment: Grade 1 anterolisthesis of L4 on L5. Vertebrae: Degenerative/discogenic endplate signal changes at L4-L5 and T12-L1. No specific evidence of acute fracture or discitis/osteomyelitis. No suspicious bone lesions. Conus medullaris and cauda equina: Conus extends to the L1-L2 level. Conus appears normal. Paraspinal and other soft tissues: Atrophic right kidney, smaller than on the 2021 prior. Disc levels: T12-L1: Mild disc bulging with mild left foraminal stenosis, similar. No significant canal or right foraminal stenosis. L1-L2: Disc bulging and right greater than left facet arthropathy. Similar mild right greater than left foraminal stenosis. Mild canal stenosis. L2-L3: Mild disc bulging. Ligamentum flavum thickening facet arthropathy. Similar moderate right and mild left foraminal stenosis. Similar mild canal stenosis L3-L4: Disc bulging with bulky ligamentum flavum thickening. Bilateral facet arthropathy. Resulting mild to moderate right foraminal stenosis with progressive mild to moderate canal stenosis. Mild left foraminal stenosis. L4-L5: Grade 1 anterolisthesis. Uncovering the disc with superimposed superiorly directed large disc protrusion. Bulky ligamentum flavum thickening facet  arthropathy. Resulting severe canal stenosis, which has progressed. Mild bilateral foraminal stenosis. L5-S1: Mild disc bulging. Moderate bilateral facet arthropathy. Similar moderate to severe left foraminal stenosis. Patent canal and right foramen. IMPRESSION: 1. At L4-L5, new large disc protrusion with progressive severe canal stenosis. 2. Milder multilevel degenerative changes are detailed above. 3. Atrophic right kidney, smaller than on the 2021 prior. Electronically Signed   By: Gilmore GORMAN Molt M.D.   On: 02/19/2023 03:38   CT Lumbar Spine Wo Contrast Result Date: 02/18/2023 CLINICAL DATA:  Low back pain, increased fracture risk EXAM: CT LUMBAR SPINE WITHOUT CONTRAST TECHNIQUE: Multidetector CT imaging of the lumbar spine was performed without intravenous contrast administration. Multiplanar CT image reconstructions were also generated. RADIATION DOSE REDUCTION: This exam was performed according to the departmental dose-optimization program which includes automated exposure control, adjustment of the mA and/or kV according to patient size and/or use of iterative reconstruction technique. COMPARISON:  None Available. FINDINGS: Segmentation: 5 lumbar type vertebrae. Alignment: Trace anterolisthesis of L3 on L4. Grade 1 anterolisthesis of L4 on L5. Vertebrae: No acute fracture or focal pathologic process. Paraspinal and other soft tissues: Diverticulosis without evidence of diverticulitis. Aortic atherosclerotic calcifications. Status post cholecystectomy. The right kidney is small in size. Left kidney is without evidence of hydronephrosis or nephrolithiasis. Disc levels: Moderate spinal canal narrowing at C3-C4 secondary to a combination of a disc bulge and ligamentum flavum hypertrophy. There is likely a relatively large intraspinal synovial cyst at the L4-L5 level on the left. There is likely severe spinal canal stenosis at this level. Recommend further evaluation with a lumbar spine MRI. IMPRESSION: 1. No  acute fracture or traumatic listhesis. 2. 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 Recommend further evaluation with a lumbar spine MRI. Aortic Atherosclerosis (ICD10-I70.0). Electronically Signed   By: Lyndall Gore M.D.   On: 02/18/2023 19:15     Scheduled Meds:  [START ON 02/20/2023] amLODipine   10 mg Oral Daily   [START ON 02/20/2023] carvedilol   12.5 mg Oral BID WC   ezetimibe   10 mg Oral Daily   famotidine   20 mg Oral Daily   ferrous sulfate   325 mg Oral Q breakfast   heparin   5,000 Units Subcutaneous Q8H   [START ON 02/20/2023] losartan   25 mg Oral Daily   pravastatin   40 mg Oral QPM   pregabalin   75 mg Oral TID   senna-docusate  1 tablet Oral BID   sertraline   25 mg Oral Daily   Continuous Infusions:   LOS: 0 days    Time spent:    Sigurd Pac, MD [facility]   02/19/2023, 9:45 AM

## 2023-02-19 NOTE — Hospital Course (Signed)
 83 year old female history of CAD status post PCI 2009, essential hypertension, statin intolerance, CKD stage III, bilateral carotid stenosis, generalized anxiety disorder, hyperlipidemia, essential hypertension, peripheral vascular disease and chronic lower back pain presented to emergency departmen complaining of back pain for 3 weeks and worsened over the last 2 days.  Patient went to urgent care 12/30 or x-ray of the thoracic and lumbar spine were normal and was discharged with tramadol  as needed.  However patient reported continued to pain radiate to her right sided leg.  Due to the pain she has limiting movement and ambulation.  No loss of bowel and bladder and saddle paresthesia.  No urinary symptoms.  No tingling and numbness. At presentation to ED patient found hypertensive which improved otherwise hemodynamically stable. CMP showed low calcium  8.5 otherwise unremarkable. CBC showed low MCV 73 elevated platelet count 418.  Normal WBC count 8.6.  CT lumbar spine no fracture.  It showed L4-L5 level on the left with associated severe spinal canal stenosis at this level. Recommend further evaluation with a lumbar spine MRI.  Following MRI of the lumbar spine showed: 1. At L4-L5, new large disc protrusion with progressive severe canal stenosis. 2. Milder multilevel degenerative changes are detailed above. 3. Atrophic right kidney, smaller than on the 2021 prior.  Neurosurgery. Dr. Lanis. He requests medicine admission. Will formally consult in AM with consideration of possible microdiscectomy.

## 2023-02-19 NOTE — ED Notes (Signed)
 1600 med not given  pt asleep will give when she wakes up

## 2023-02-20 ENCOUNTER — Inpatient Hospital Stay (HOSPITAL_COMMUNITY): Payer: Medicare HMO | Admitting: Anesthesiology

## 2023-02-20 ENCOUNTER — Other Ambulatory Visit: Payer: Self-pay

## 2023-02-20 ENCOUNTER — Inpatient Hospital Stay (HOSPITAL_COMMUNITY): Payer: Medicare HMO

## 2023-02-20 ENCOUNTER — Encounter (HOSPITAL_COMMUNITY): Payer: Self-pay | Admitting: Internal Medicine

## 2023-02-20 ENCOUNTER — Inpatient Hospital Stay (HOSPITAL_COMMUNITY)
Admission: EM | Disposition: A | Discharge status: Skilled Nursing Facility | Payer: Self-pay | Source: Home / Self Care | Attending: Internal Medicine

## 2023-02-20 DIAGNOSIS — M4807 Spinal stenosis, lumbosacral region: Secondary | ICD-10-CM | POA: Diagnosis not present

## 2023-02-20 DIAGNOSIS — M5126 Other intervertebral disc displacement, lumbar region: Secondary | ICD-10-CM | POA: Diagnosis not present

## 2023-02-20 DIAGNOSIS — N182 Chronic kidney disease, stage 2 (mild): Secondary | ICD-10-CM | POA: Diagnosis not present

## 2023-02-20 DIAGNOSIS — M545 Low back pain, unspecified: Secondary | ICD-10-CM | POA: Diagnosis present

## 2023-02-20 DIAGNOSIS — I2510 Atherosclerotic heart disease of native coronary artery without angina pectoris: Secondary | ICD-10-CM

## 2023-02-20 DIAGNOSIS — I129 Hypertensive chronic kidney disease with stage 1 through stage 4 chronic kidney disease, or unspecified chronic kidney disease: Secondary | ICD-10-CM | POA: Diagnosis not present

## 2023-02-20 HISTORY — PX: LUMBAR LAMINECTOMY/DECOMPRESSION MICRODISCECTOMY: SHX5026

## 2023-02-20 LAB — COMPREHENSIVE METABOLIC PANEL
ALT: 9 U/L (ref 0–44)
AST: 14 U/L — ABNORMAL LOW (ref 15–41)
Albumin: 3.4 g/dL — ABNORMAL LOW (ref 3.5–5.0)
Alkaline Phosphatase: 60 U/L (ref 38–126)
Anion gap: 11 (ref 5–15)
BUN: 14 mg/dL (ref 8–23)
CO2: 23 mmol/L (ref 22–32)
Calcium: 8.4 mg/dL — ABNORMAL LOW (ref 8.9–10.3)
Chloride: 104 mmol/L (ref 98–111)
Creatinine, Ser: 1.11 mg/dL — ABNORMAL HIGH (ref 0.44–1.00)
GFR, Estimated: 49 mL/min — ABNORMAL LOW (ref >60)
Glucose, Bld: 101 mg/dL — ABNORMAL HIGH (ref 70–99)
Potassium: 3.8 mmol/L (ref 3.5–5.1)
Sodium: 138 mmol/L (ref 135–145)
Total Bilirubin: 0.6 mg/dL (ref 0.0–1.2)
Total Protein: 6.1 g/dL — ABNORMAL LOW (ref 6.5–8.1)

## 2023-02-20 LAB — CBC
HCT: 39.9 % (ref 36.0–46.0)
Hemoglobin: 12.3 g/dL (ref 12.0–15.0)
MCH: 23.8 pg — ABNORMAL LOW (ref 26.0–34.0)
MCHC: 30.8 g/dL (ref 30.0–36.0)
MCV: 77.2 fL — ABNORMAL LOW (ref 80.0–100.0)
Platelets: 369 10*3/uL (ref 150–400)
RBC: 5.17 MIL/uL — ABNORMAL HIGH (ref 3.87–5.11)
RDW: 15.4 % (ref 11.5–15.5)
WBC: 11.3 10*3/uL — ABNORMAL HIGH (ref 4.0–10.5)
nRBC: 0 % (ref 0.0–0.2)

## 2023-02-20 LAB — SURGICAL PCR SCREEN
MRSA, PCR: NEGATIVE
Staphylococcus aureus: NEGATIVE

## 2023-02-20 SURGERY — LUMBAR LAMINECTOMY/DECOMPRESSION MICRODISCECTOMY 1 LEVEL
Anesthesia: General | Site: Spine Lumbar

## 2023-02-20 MED ORDER — METHYLPREDNISOLONE ACETATE 80 MG/ML IJ SUSP
INTRAMUSCULAR | Status: AC
  Filled 2023-02-20: qty 1

## 2023-02-20 MED ORDER — CEFAZOLIN SODIUM-DEXTROSE 2-4 GM/100ML-% IV SOLN
2.0000 g | Freq: Three times a day (TID) | INTRAVENOUS | Status: AC
  Administered 2023-02-20 – 2023-02-21 (×2): 2 g via INTRAVENOUS
  Filled 2023-02-20 (×2): qty 100

## 2023-02-20 MED ORDER — FENTANYL CITRATE (PF) 250 MCG/5ML IJ SOLN
INTRAMUSCULAR | Status: DC | PRN
  Administered 2023-02-20: 100 ug via INTRAVENOUS
  Administered 2023-02-20: 50 ug via INTRAVENOUS

## 2023-02-20 MED ORDER — THROMBIN 5000 UNITS EX SOLR
OROMUCOSAL | Status: DC | PRN
  Administered 2023-02-20: 5 mL

## 2023-02-20 MED ORDER — LIDOCAINE-EPINEPHRINE 1 %-1:100000 IJ SOLN
INTRAMUSCULAR | Status: DC | PRN
  Administered 2023-02-20: 5 mL

## 2023-02-20 MED ORDER — ROCURONIUM BROMIDE 10 MG/ML (PF) SYRINGE
PREFILLED_SYRINGE | INTRAVENOUS | Status: AC
  Filled 2023-02-20: qty 10

## 2023-02-20 MED ORDER — PROPOFOL 500 MG/50ML IV EMUL
INTRAVENOUS | Status: DC | PRN
  Administered 2023-02-20: 100 ug/kg/min via INTRAVENOUS

## 2023-02-20 MED ORDER — ACETAMINOPHEN 325 MG PO TABS
325.0000 mg | ORAL_TABLET | Freq: Once | ORAL | Status: DC | PRN
Start: 2023-02-20 — End: 2023-02-20

## 2023-02-20 MED ORDER — ACETAMINOPHEN 10 MG/ML IV SOLN
INTRAVENOUS | Status: AC
  Filled 2023-02-20: qty 100

## 2023-02-20 MED ORDER — ALBUMIN HUMAN 5 % IV SOLN: INTRAVENOUS | Status: DC | PRN

## 2023-02-20 MED ORDER — METHYLPREDNISOLONE ACETATE 80 MG/ML IJ SUSP
INTRAMUSCULAR | Status: DC | PRN
  Administered 2023-02-20: 80 mg

## 2023-02-20 MED ORDER — LIDOCAINE 2% (20 MG/ML) 5 ML SYRINGE
INTRAMUSCULAR | Status: AC
Start: 2023-02-20 — End: ?
  Filled 2023-02-20: qty 5

## 2023-02-20 MED ORDER — HYDROMORPHONE HCL 1 MG/ML IJ SOLN
0.2500 mg | INTRAMUSCULAR | Status: DC | PRN
  Administered 2023-02-20: 0.5 mg via INTRAVENOUS
  Administered 2023-02-20 (×2): 0.25 mg via INTRAVENOUS

## 2023-02-20 MED ORDER — BUPIVACAINE HCL (PF) 0.5 % IJ SOLN
INTRAMUSCULAR | Status: AC
  Filled 2023-02-20: qty 30

## 2023-02-20 MED ORDER — PHENOL 1.4 % MT LIQD: 1.0000 | OROMUCOSAL | Status: DC | PRN

## 2023-02-20 MED ORDER — ORAL CARE MOUTH RINSE: 15.0000 mL | Freq: Once | OROMUCOSAL | Status: AC

## 2023-02-20 MED ORDER — PHENYLEPHRINE HCL-NACL 20-0.9 MG/250ML-% IV SOLN
INTRAVENOUS | Status: DC | PRN
  Administered 2023-02-20: 25 ug/min via INTRAVENOUS

## 2023-02-20 MED ORDER — PROPOFOL 1000 MG/100ML IV EMUL
INTRAVENOUS | Status: AC
  Filled 2023-02-20: qty 200

## 2023-02-20 MED ORDER — 0.9 % SODIUM CHLORIDE (POUR BTL) OPTIME
TOPICAL | Status: DC | PRN
  Administered 2023-02-20: 1000 mL

## 2023-02-20 MED ORDER — ACETAMINOPHEN 10 MG/ML IV SOLN
1000.0000 mg | Freq: Once | INTRAVENOUS | Status: DC | PRN
Start: 2023-02-20 — End: 2023-02-20
  Administered 2023-02-20: 1000 mg via INTRAVENOUS

## 2023-02-20 MED ORDER — LACTATED RINGERS IV SOLN: INTRAVENOUS | Status: DC

## 2023-02-20 MED ORDER — BUPIVACAINE HCL (PF) 0.5 % IJ SOLN
INTRAMUSCULAR | Status: DC | PRN
  Administered 2023-02-20: 5 mL

## 2023-02-20 MED ORDER — DEXAMETHASONE SODIUM PHOSPHATE 10 MG/ML IJ SOLN
INTRAMUSCULAR | Status: DC | PRN
  Administered 2023-02-20: 10 mg via INTRAVENOUS

## 2023-02-20 MED ORDER — PROPOFOL 10 MG/ML IV BOLUS
INTRAVENOUS | Status: AC
  Filled 2023-02-20: qty 20

## 2023-02-20 MED ORDER — MENTHOL 3 MG MT LOZG: 1.0000 | LOZENGE | OROMUCOSAL | Status: DC | PRN

## 2023-02-20 MED ORDER — PROPOFOL 10 MG/ML IV BOLUS
INTRAVENOUS | Status: DC | PRN
  Administered 2023-02-20: 100 mg via INTRAVENOUS

## 2023-02-20 MED ORDER — CEFAZOLIN SODIUM-DEXTROSE 2-4 GM/100ML-% IV SOLN
INTRAVENOUS | Status: AC
  Filled 2023-02-20: qty 100

## 2023-02-20 MED ORDER — SUGAMMADEX SODIUM 200 MG/2ML IV SOLN
INTRAVENOUS | Status: DC | PRN
  Administered 2023-02-20: 200 mg via INTRAVENOUS

## 2023-02-20 MED ORDER — EPHEDRINE 5 MG/ML INJ
INTRAVENOUS | Status: AC
  Filled 2023-02-20: qty 5

## 2023-02-20 MED ORDER — LIDOCAINE 2% (20 MG/ML) 5 ML SYRINGE
INTRAMUSCULAR | Status: DC | PRN
  Administered 2023-02-20: 40 mg via INTRAVENOUS

## 2023-02-20 MED ORDER — SODIUM CHLORIDE 0.9% FLUSH: 3.0000 mL | INTRAVENOUS | Status: DC | PRN

## 2023-02-20 MED ORDER — PANTOPRAZOLE SODIUM 40 MG IV SOLR
40.0000 mg | Freq: Every day | INTRAVENOUS | Status: DC
  Administered 2023-02-20: 40 mg via INTRAVENOUS
  Filled 2023-02-20: qty 10

## 2023-02-20 MED ORDER — MEPERIDINE HCL 25 MG/ML IJ SOLN: 6.2500 mg | INTRAMUSCULAR | Status: DC | PRN

## 2023-02-20 MED ORDER — DROPERIDOL 2.5 MG/ML IJ SOLN: 0.6250 mg | Freq: Once | INTRAMUSCULAR | Status: DC | PRN

## 2023-02-20 MED ORDER — CHLORHEXIDINE GLUCONATE 0.12 % MT SOLN
OROMUCOSAL | Status: AC
  Administered 2023-02-20: 15 mL via OROMUCOSAL
  Filled 2023-02-20: qty 15

## 2023-02-20 MED ORDER — DEXAMETHASONE SODIUM PHOSPHATE 10 MG/ML IJ SOLN
INTRAMUSCULAR | Status: AC
  Filled 2023-02-20: qty 1

## 2023-02-20 MED ORDER — LIDOCAINE-EPINEPHRINE 1 %-1:100000 IJ SOLN
INTRAMUSCULAR | Status: AC
  Filled 2023-02-20: qty 1

## 2023-02-20 MED ORDER — SODIUM CHLORIDE 0.9 % IV SOLN
250.0000 mL | INTRAVENOUS | Status: DC
Start: 2023-02-20 — End: 2023-02-21

## 2023-02-20 MED ORDER — THROMBIN (RECOMBINANT) 5000 UNITS EX SOLR
CUTANEOUS | Status: DC | PRN
  Administered 2023-02-20: 10 mL

## 2023-02-20 MED ORDER — ROCURONIUM BROMIDE 10 MG/ML (PF) SYRINGE
PREFILLED_SYRINGE | INTRAVENOUS | Status: DC | PRN
  Administered 2023-02-20: 10 mg via INTRAVENOUS
  Administered 2023-02-20: 50 mg via INTRAVENOUS

## 2023-02-20 MED ORDER — PHENYLEPHRINE 80 MCG/ML (10ML) SYRINGE FOR IV PUSH (FOR BLOOD PRESSURE SUPPORT)
PREFILLED_SYRINGE | INTRAVENOUS | Status: DC | PRN
  Administered 2023-02-20 (×2): 160 ug via INTRAVENOUS

## 2023-02-20 MED ORDER — CHLORHEXIDINE GLUCONATE 0.12 % MT SOLN: 15.0000 mL | Freq: Once | OROMUCOSAL | Status: AC

## 2023-02-20 MED ORDER — FENTANYL CITRATE (PF) 250 MCG/5ML IJ SOLN
INTRAMUSCULAR | Status: AC
  Filled 2023-02-20: qty 5

## 2023-02-20 MED ORDER — ACETAMINOPHEN 160 MG/5ML PO SOLN
325.0000 mg | Freq: Once | ORAL | Status: DC | PRN
Start: 2023-02-20 — End: 2023-02-20

## 2023-02-20 MED ORDER — ONDANSETRON HCL 4 MG/2ML IJ SOLN
INTRAMUSCULAR | Status: DC | PRN
  Administered 2023-02-20: 4 mg via INTRAVENOUS

## 2023-02-20 MED ORDER — ONDANSETRON HCL 4 MG/2ML IJ SOLN
INTRAMUSCULAR | Status: AC
  Filled 2023-02-20: qty 2

## 2023-02-20 MED ORDER — SODIUM CHLORIDE 0.9% FLUSH
3.0000 mL | Freq: Two times a day (BID) | INTRAVENOUS | Status: DC
Start: 2023-02-20 — End: 2023-02-21
  Administered 2023-02-20: 3 mL via INTRAVENOUS

## 2023-02-20 MED ORDER — HYDROMORPHONE HCL 1 MG/ML IJ SOLN
INTRAMUSCULAR | Status: AC
  Filled 2023-02-20: qty 1

## 2023-02-20 MED ORDER — PHENYLEPHRINE 80 MCG/ML (10ML) SYRINGE FOR IV PUSH (FOR BLOOD PRESSURE SUPPORT)
PREFILLED_SYRINGE | INTRAVENOUS | Status: AC
Start: 2023-02-20 — End: ?
  Filled 2023-02-20: qty 10

## 2023-02-20 MED ORDER — CEFAZOLIN SODIUM-DEXTROSE 2-4 GM/100ML-% IV SOLN
2.0000 g | Freq: Once | INTRAVENOUS | Status: AC
  Administered 2023-02-20: 2 g via INTRAVENOUS

## 2023-02-20 MED ORDER — LACTATED RINGERS IV SOLN: INTRAVENOUS | Status: DC | PRN

## 2023-02-20 MED ORDER — THROMBIN 5000 UNITS EX SOLR
CUTANEOUS | Status: AC
  Filled 2023-02-20: qty 15000

## 2023-02-20 SURGICAL SUPPLY — 47 items
BAG COUNTER SPONGE SURGICOUNT (BAG) ×1 IMPLANT
BENZOIN TINCTURE PRP APPL 2/3 (GAUZE/BANDAGES/DRESSINGS) IMPLANT
BLADE CLIPPER SURG (BLADE) IMPLANT
BLADE SURG 11 STRL SS (BLADE) ×1 IMPLANT
BUR MATCHSTICK NEURO 3.0 LAGG (BURR) ×1 IMPLANT
BUR PRECISION FLUTE 5.0 (BURR) IMPLANT
CANISTER SUCT 3000ML PPV (MISCELLANEOUS) ×1 IMPLANT
DERMABOND ADVANCED .7 DNX12 (GAUZE/BANDAGES/DRESSINGS) ×1 IMPLANT
DRAPE LAPAROTOMY 100X72X124 (DRAPES) ×1 IMPLANT
DRAPE MICROSCOPE SLANT 54X150 (MISCELLANEOUS) ×1 IMPLANT
DRAPE SURG 17X23 STRL (DRAPES) ×1 IMPLANT
DRSG OPSITE POSTOP 3X4 (GAUZE/BANDAGES/DRESSINGS) ×1 IMPLANT
DRSG OPSITE POSTOP 4X6 (GAUZE/BANDAGES/DRESSINGS) IMPLANT
DURAPREP 26ML APPLICATOR (WOUND CARE) ×1 IMPLANT
ELECT REM PT RETURN 9FT ADLT (ELECTROSURGICAL) ×1 IMPLANT
ELECTRODE REM PT RTRN 9FT ADLT (ELECTROSURGICAL) ×1 IMPLANT
GAUZE 4X4 16PLY ~~LOC~~+RFID DBL (SPONGE) IMPLANT
GAUZE SPONGE 4X4 12PLY STRL (GAUZE/BANDAGES/DRESSINGS) IMPLANT
GLOVE BIOGEL PI IND STRL 7.5 (GLOVE) ×1 IMPLANT
GLOVE ECLIPSE 7.0 STRL STRAW (GLOVE) ×1 IMPLANT
GLOVE EXAM NITRILE XL STR (GLOVE) IMPLANT
GOWN STRL REUS W/ TWL LRG LVL3 (GOWN DISPOSABLE) ×2 IMPLANT
GOWN STRL REUS W/ TWL XL LVL3 (GOWN DISPOSABLE) IMPLANT
GOWN STRL REUS W/TWL 2XL LVL3 (GOWN DISPOSABLE) IMPLANT
HEMOSTAT POWDER KIT SURGIFOAM (HEMOSTASIS) ×1 IMPLANT
KIT BASIN OR (CUSTOM PROCEDURE TRAY) ×1 IMPLANT
KIT TURNOVER KIT B (KITS) ×1 IMPLANT
NDL HYPO 18GX1.5 BLUNT FILL (NEEDLE) IMPLANT
NDL HYPO 22X1.5 SAFETY MO (MISCELLANEOUS) ×1 IMPLANT
NDL SPNL 18GX3.5 QUINCKE PK (NEEDLE) IMPLANT
NEEDLE HYPO 18GX1.5 BLUNT FILL (NEEDLE) IMPLANT
NEEDLE HYPO 22X1.5 SAFETY MO (MISCELLANEOUS) ×1 IMPLANT
NEEDLE SPNL 18GX3.5 QUINCKE PK (NEEDLE) IMPLANT
NS IRRIG 1000ML POUR BTL (IV SOLUTION) ×1 IMPLANT
PACK LAMINECTOMY NEURO (CUSTOM PROCEDURE TRAY) ×1 IMPLANT
PAD ARMBOARD 7.5X6 YLW CONV (MISCELLANEOUS) ×3 IMPLANT
SPIKE FLUID TRANSFER (MISCELLANEOUS) ×1 IMPLANT
SPONGE SURGIFOAM ABS GEL SZ50 (HEMOSTASIS) ×1 IMPLANT
SPONGE T-LAP 4X18 ~~LOC~~+RFID (SPONGE) IMPLANT
STRIP CLOSURE SKIN 1/2X4 (GAUZE/BANDAGES/DRESSINGS) IMPLANT
SUT VIC AB 0 CT1 18XCR BRD8 (SUTURE) ×1 IMPLANT
SUT VIC AB 2-0 CT1 18 (SUTURE) IMPLANT
SUT VICRYL 3-0 RB1 18 ABS (SUTURE) ×2 IMPLANT
SYR 3ML LL SCALE MARK (SYRINGE) IMPLANT
TOWEL GREEN STERILE (TOWEL DISPOSABLE) ×1 IMPLANT
TOWEL GREEN STERILE FF (TOWEL DISPOSABLE) ×1 IMPLANT
WATER STERILE IRR 1000ML POUR (IV SOLUTION) ×1 IMPLANT

## 2023-02-20 NOTE — Progress Notes (Signed)
 PROGRESS NOTE    Kathryn [name]  FMW:[id number] DOB: [birth year] DOA: 02/18/2023 PCP: Jarold Medici, MD  82/F with history of CAD, remote PCI in 2009, CKD 2, bilateral carotid artery stenosis, anxiety, hypertension, peripheral vascular disease, chronic back pain presented to the ED 1/5 with worsening low back pain for 3 weeks but acutely worse X 2 days with limited mobility.  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 Lanis consulting, plan for lumbar laminectomy and microdiscectomy today  Essential hypertension -Continue amlodipine , Coreg , losartan   CKD 2 -Stable  History of CAD Carotid artery disease -Remote PCI, stable continue Coreg  and statin, aspirin  on hold  History of anxiety -Xanax  resumed  Reactive airway disease Continue Xopenex  as needed  Chronic microcytic anemia Stable  DVT prophylaxis: SCDs Code Status: Full code Family Communication: Daughters at bedside Disposition Plan: To be determined  Consultants:    Procedures:   Antimicrobials:    Objective: Vitals:   02/20/23 0954 02/20/23 1000 02/20/23 1100 02/20/23 1200  BP: (!) 143/61 (!) 187/75 (!) 141/62 (!) 125/57  Pulse:  85 79 71  Resp:  14 13 14   Temp:    98 F (36.7 C)  TempSrc:      SpO2:  95% 97% 92%    Intake/Output Summary (Last 24 hours) at 02/20/2023 1211 Last data filed at 02/20/2023 0350 Gross per 24 hour  Intake 400 ml  Output --  Net 400 ml   There were no vitals filed for this visit.  Examination:  General exam: Obese elderly female laying in bed, AAOx3 Respiratory system: Clear to auscultation Cardiovascular  system: S1 & S2 heard, RRR.  Abd: nondistended, soft and nontender.Normal bowel sounds heard. Central nervous system: Alert and oriented. No focal neurological deficits.  Pain with flexion at the hip Extremities: no edema Skin: No rashes Psychiatry:  Mood & affect appropriate.     Data Reviewed:   CBC: Recent Labs  Lab 02/18/23 1841 02/20/23 1058  WBC 8.6 11.3*  HGB 12.0 12.3  HCT 37.5 39.9  MCV 73.8* 77.2*  PLT 418* 369   Basic Metabolic Panel: Recent Labs  Lab 02/18/23 1841 02/20/23 1058  NA 143 138  K 3.5 3.8  CL 106 104  CO2 27 23  GLUCOSE 79 101*  BUN 9 14  CREATININE 0.83 1.11*  CALCIUM  8.5* 8.4*   GFR: CrCl cannot be calculated (Unknown ideal weight.). Liver Function Tests: Recent Labs  Lab 02/20/23 1058  AST 14*  ALT 9  ALKPHOS 60  BILITOT 0.6  PROT 6.1*  ALBUMIN  3.4*   No results for input(s): LIPASE, AMYLASE in the last 168 hours. No results for input(s): AMMONIA in the last 168 hours. Coagulation Profile: No results for input(s): INR, PROTIME in the last 168 hours. Cardiac Enzymes: No results for input(s): CKTOTAL, CKMB, CKMBINDEX, TROPONINI in the last 168 hours. BNP (last 3 results) No results for input(s): PROBNP in the last 8760 hours. HbA1C: No results for input(s): HGBA1C in the last 72 hours. CBG: No results for input(s): GLUCAP in the last 168 hours. Lipid Profile: No results for input(s): CHOL, HDL, LDLCALC, TRIG, CHOLHDL, LDLDIRECT in the last 72 hours. Thyroid  Function Tests: No  results for input(s): TSH, T4TOTAL, FREET4, T3FREE, THYROIDAB in the last 72 hours. Anemia Panel: No results for input(s): VITAMINB12, FOLATE, FERRITIN, TIBC, IRON, RETICCTPCT in the last 72 hours. Urine analysis:    Component Value Date/Time   COLORURINE STRAW (A) 01/14/2018 1500   APPEARANCEUR CLEAR 01/14/2018 1500   LABSPEC 1.005 01/14/2018 1500   PHURINE 6.0 01/14/2018 1500   GLUCOSEU  NEGATIVE 01/14/2018 1500   HGBUR SMALL (A) 01/14/2018 1500   BILIRUBINUR negative 03/23/2022 1647   KETONESUR NEGATIVE 01/14/2018 1500   PROTEINUR Positive (A) 03/23/2022 1647   PROTEINUR NEGATIVE 01/14/2018 1500   UROBILINOGEN 0.2 03/23/2022 1647   UROBILINOGEN 0.2 03/08/2011 0931   NITRITE negative 03/23/2022 1647   NITRITE NEGATIVE 01/14/2018 1500   LEUKOCYTESUR Negative 03/23/2022 1647   Sepsis Labs: @LABRCNTIP (procalcitonin:4,lacticidven:4)  )No results found for this or any previous visit (from the past 240 hours).   Radiology Studies: MR LUMBAR SPINE WO CONTRAST Result Date: 02/19/2023 CLINICAL DATA:  Low back pain, cauda equina syndrome suspected EXAM: MRI LUMBAR SPINE WITHOUT CONTRAST TECHNIQUE: Multiplanar, multisequence MR imaging of the lumbar spine was performed. No intravenous contrast was administered. COMPARISON:  None Available. FINDINGS: Segmentation: Standard segmentation is assumed. The inferior-most fully formed intervertebral disc labeled L5-S1. Alignment: Grade 1 anterolisthesis of L4 on L5. Vertebrae: Degenerative/discogenic endplate signal changes at L4-L5 and T12-L1. No specific evidence of acute fracture or discitis/osteomyelitis. No suspicious bone lesions. Conus medullaris and cauda equina: Conus extends to the L1-L2 level. Conus appears normal. Paraspinal and other soft tissues: Atrophic right kidney, smaller than on the 2021 prior. Disc levels: T12-L1: Mild disc bulging with mild left foraminal stenosis, similar. No significant canal or right foraminal stenosis. L1-L2: Disc bulging and right greater than left facet arthropathy. Similar mild right greater than left foraminal stenosis. Mild canal stenosis. L2-L3: Mild disc bulging. Ligamentum flavum thickening facet arthropathy. Similar moderate right and mild left foraminal stenosis. Similar mild canal stenosis L3-L4: Disc bulging with bulky ligamentum flavum thickening. Bilateral facet arthropathy. Resulting mild to  moderate right foraminal stenosis with progressive mild to moderate canal stenosis. Mild left foraminal stenosis. L4-L5: Grade 1 anterolisthesis. Uncovering the disc with superimposed superiorly directed large disc protrusion. Bulky ligamentum flavum thickening facet arthropathy. Resulting severe canal stenosis, which has progressed. Mild bilateral foraminal stenosis. L5-S1: Mild disc bulging. Moderate bilateral facet arthropathy. Similar moderate to severe left foraminal stenosis. Patent canal and right foramen. IMPRESSION: 1. At L4-L5, new large disc protrusion with progressive severe canal stenosis. 2. Milder multilevel degenerative changes are detailed above. 3. Atrophic right kidney, smaller than on the 2021 prior. Electronically Signed   By: Gilmore GORMAN Molt M.D.   On: 02/19/2023 03:38   CT Lumbar Spine Wo Contrast Result Date: 02/18/2023 CLINICAL DATA:  Low back pain, increased fracture risk EXAM: CT LUMBAR SPINE WITHOUT CONTRAST TECHNIQUE: Multidetector CT imaging of the lumbar spine was performed without intravenous contrast administration. Multiplanar CT image reconstructions were also generated. RADIATION DOSE REDUCTION: This exam was performed according to the departmental dose-optimization program which includes automated exposure control, adjustment of the mA and/or kV according to patient size and/or use of iterative reconstruction technique. COMPARISON:  None Available. FINDINGS: Segmentation: 5 lumbar type vertebrae. Alignment: Trace anterolisthesis of L3 on L4. Grade 1 anterolisthesis of L4 on L5. Vertebrae: No acute fracture or focal pathologic process. Paraspinal and other soft tissues: Diverticulosis without evidence of diverticulitis. Aortic atherosclerotic calcifications. Status post cholecystectomy. The right kidney is small in size. Left kidney is without evidence of hydronephrosis  or nephrolithiasis. Disc levels: Moderate spinal canal narrowing at C3-C4 secondary to a combination of a  disc bulge and ligamentum flavum hypertrophy. There is likely a relatively large intraspinal synovial cyst at the L4-L5 level on the left. There is likely severe spinal canal stenosis at this level. Recommend further evaluation with a lumbar spine MRI. IMPRESSION: 1. No acute fracture or traumatic listhesis. 2. 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 Recommend further evaluation with a lumbar spine MRI. Aortic Atherosclerosis (ICD10-I70.0). Electronically Signed   By: Lyndall Gore M.D.   On: 02/18/2023 19:15     Scheduled Meds:  amLODipine   10 mg Oral Daily   carvedilol   12.5 mg Oral BID WC   ezetimibe   10 mg Oral Daily   famotidine   20 mg Oral Daily   ferrous sulfate   325 mg Oral Q breakfast   losartan   25 mg Oral Daily   pravastatin   40 mg Oral QPM   pregabalin   75 mg Oral TID   senna-docusate  1 tablet Oral BID   sertraline   25 mg Oral Daily   Continuous Infusions:   LOS: 1 day    Time spent:    Sigurd Pac, MD [facility]   02/20/2023, 12:11 PM

## 2023-02-20 NOTE — Anesthesia Postprocedure Evaluation (Signed)
 Anesthesia Post Note  Patient: Kathryn [name]  Procedure(s) Performed: LUMBAR LAMINECTOMY/DECOMPRESSION MICRODISCECTOMY 1 LEVEL L4-L5 (Spine Lumbar)     Patient location during evaluation: PACU Anesthesia Type: General Level of consciousness: awake and alert Pain management: pain level controlled Vital Signs Assessment: post-procedure vital signs reviewed and stable Respiratory status: spontaneous breathing, nonlabored ventilation, respiratory function stable and patient connected to nasal cannula oxygen  Cardiovascular status: blood pressure returned to baseline and stable Postop Assessment: no apparent nausea or vomiting Anesthetic complications: no  No notable events documented.  Last Vitals:  Vitals:   02/20/23 1900 02/20/23 1915  BP: (!) 110/53 97/61  Pulse: 77 78  Resp: 11 10  Temp:    SpO2: 95% 93%    Last Pain:  Vitals:   02/20/23 1915  TempSrc:   PainSc: Asleep    LLE Motor Response: Purposeful movement;Responds to commands (02/20/23 1915) LLE Sensation: Full sensation (02/20/23 1915) RLE Motor Response: Purposeful movement;Responds to commands (02/20/23 1915) RLE Sensation: Full sensation (02/20/23 1915)      [name]

## 2023-02-20 NOTE — Progress Notes (Signed)
  NEUROSURGERY PROGRESS NOTE   No issues overnight. Unchanged   EXAM:  BP (!) 154/48   Pulse 78   Temp 97.9 F (36.6 C) (Oral)   Resp 16   Ht 5' 5 (1.651 m)   Wt 88.5 kg   SpO2 95%   BMI 32.45 kg/m   Awake, alert, oriented  Speech fluent, appropriate  CN grossly intact  5/5 BUE/BLE   IMPRESSION:  83 y.o. female with severe stenosis at L45 with associated back and leg pain  PLAN: - Will proceed with decompression at L4-5 including laminectomy/microdiscectomy  I have reviewed the indications for the procedure as well as the details of the procedure and the expected postoperative course and recovery at length with the patient in the office. We have also reviewed in detail the risks, benefits, and alternatives to the procedure. We discussed the potential, albeit unlikely, of needing to instrument depending on degree of facetectomy required for decompression. All questions were answered and Kathryn [name] provided informed consent to proceed.    Gerldine Maizes, MD [facility]

## 2023-02-20 NOTE — Transfer of Care (Signed)
 Immediate Anesthesia Transfer of Care Note  Patient: Kathryn [name]  Procedure(s) Performed: LUMBAR LAMINECTOMY/DECOMPRESSION MICRODISCECTOMY 1 LEVEL L4-L5 (Spine Lumbar)  Patient Location: PACU  Anesthesia Type:General  Level of Consciousness: awake  Airway & Oxygen  Therapy: Patient Spontanous Breathing  Post-op Assessment: Report given to RN and Post -op Vital signs reviewed and stable  Post vital signs: Reviewed and stable  Last Vitals:  Vitals Value Taken Time  BP 116/57 02/20/23 1823  Temp    Pulse 87 02/20/23 1826  Resp 15 02/20/23 1826  SpO2 92 % 02/20/23 1826  Vitals shown include unfiled device data.  Last Pain:  Vitals:   02/20/23 1347  TempSrc:   PainSc: 0-No pain         Complications: No notable events documented.

## 2023-02-20 NOTE — ED Notes (Signed)
 ED TO INPATIENT HANDOFF REPORT  ED Nurse Name and Phone #: [place] RN [phone]  S Name/Age/Gender Kathryn [name] 83 y.o. female Room/Bed: 044C/044C  Code Status   Code Status: Full Code  Home/SNF/Other Home Patient oriented to: self, place, time, and situation Is this baseline? Yes   Triage Complete: Triage complete  Chief Complaint Protrusion of lumbar intervertebral disc [M51.26] Low back pain [M54.50]  Triage Note Please note that is was PTAR who brought her here from home. She c/o low back pain real bad for about three weeks; and worse for the past two days. She states this low back pain radiates equally into both right and left hip/legs. She denies any recent trauma. She states she does have a hx of sciatica. Two daughters are with her.   Allergies Allergies  Allergen Reactions   Codeine Hypertension   Ibuprofen Other (See Comments)    Makes sick on stomach    Level of Care/Admitting Diagnosis ED Disposition     ED Disposition  Admit   Condition  --   Comment  Hospital Area: [facility] [[id number]]  Level of Care: Med-Surg [[id number]]  May admit patient to [facility] or [facility] if equivalent level of care is available:: No  Covid Evaluation: Asymptomatic - no recent exposure (last 10 days) testing not required  Diagnosis: Low back pain [834547]  Admitting Physician: FAIRY FRAMES [[id number]]  Attending Physician: FAIRY FRAMES [[id number]]  Certification:: I certify this patient will need inpatient services for at least 2 midnights          B Medical/Surgery History Past Medical History:  Diagnosis Date   Anxiety    takes Xanax  bid prn   Arthritis    Bruises easily    pt is on Plavix    Chronic back pain    buldging disc   Chronic neck pain    Coronary artery disease    1 stent    Dysphagia    Headache(784.0)    related to cervical issues   History of colonic polyps    Hyperlipidemia    takes Crestor  daily   Hypertension     takes Coreg  and Diovan  daily   Myocardial infarction (HCC) 2019   Osteoporosis    was taking shot 2xyr;but not taking anymore   Peripheral vascular disease (HCC)    Sciatica    Seasonal allergies    takes Zyrtec nightly   Past Surgical History:  Procedure Laterality Date   ABDOMINAL HYSTERECTOMY  70's   BUNIONECTOMY     left foot   CARDIAC CATHETERIZATION  2009/2011   1 stent placed   CATARACT EXTRACTION Bilateral    CHOLECYSTECTOMY  2009   COLONOSCOPY     ESOPHAGOGASTRODUODENOSCOPY     LEFT HEART CATH AND CORONARY ANGIOGRAPHY N/A 01/16/2018   Procedure: LEFT HEART CATH AND CORONARY ANGIOGRAPHY;  Surgeon: Elmira Newman PARAS, MD;  Location: [facility] INVASIVE [facility] LAB;  Service: Cardiovascular;  Laterality: N/A;   TONSILLECTOMY     at age 78     A IV Location/Drains/Wounds Patient Lines/Drains/Airways Status     Active Line/Drains/Airways     Name Placement date Placement time Site Days   Peripheral IV 02/18/23 20 G Left Antecubital 02/18/23  1959  Antecubital  2   External Urinary Catheter 02/18/23  1917  --  2            Intake/Output Last 24 hours  Intake/Output Summary (Last 24 hours) at 02/20/2023 1208 Last data filed  at 02/20/2023 0350 Gross per 24 hour  Intake 400 ml  Output --  Net 400 ml    Labs/Imaging Results for orders placed or performed during the hospital encounter of 02/18/23 (from the past 48 hours)  CBC     Status: Abnormal   Collection Time: 02/18/23  6:41 PM  Result Value Ref Range   WBC 8.6 4.0 - 10.5 K/uL   RBC 5.08 3.87 - 5.11 MIL/uL   Hemoglobin 12.0 12.0 - 15.0 g/dL   HCT 62.4 63.9 - 53.9 %   MCV 73.8 (L) 80.0 - 100.0 fL   MCH 23.6 (L) 26.0 - 34.0 pg   MCHC 32.0 30.0 - 36.0 g/dL   RDW 84.9 88.4 - 84.4 %   Platelets 418 (H) 150 - 400 K/uL   nRBC 0.0 0.0 - 0.2 %    Comment: Performed at [facility], [address] [place], [place] [address]  Basic metabolic panel     Status: Abnormal   Collection Time: 02/18/23   6:41 PM  Result Value Ref Range   Sodium 143 135 - 145 mmol/L   Potassium 3.5 3.5 - 5.1 mmol/L   Chloride 106 98 - 111 mmol/L   CO2 27 22 - 32 mmol/L   Glucose, Bld 79 70 - 99 mg/dL    Comment: Glucose reference range applies only to samples taken after fasting for at least 8 hours.   BUN 9 8 - 23 mg/dL   Creatinine, Ser 9.16 0.44 - 1.00 mg/dL   Calcium  8.5 (L) 8.9 - 10.3 mg/dL   GFR, Estimated >39 >39 mL/min    Comment: (NOTE) Calculated using the CKD-EPI Creatinine Equation (2021)    Anion gap 10 5 - 15    Comment: Performed at [facility], [address], [place], [place] [address]  CBC     Status: Abnormal   Collection Time: 02/20/23 10:58 AM  Result Value Ref Range   WBC 11.3 (H) 4.0 - 10.5 K/uL   RBC 5.17 (H) 3.87 - 5.11 MIL/uL   Hemoglobin 12.3 12.0 - 15.0 g/dL   HCT 60.0 63.9 - 53.9 %   MCV 77.2 (L) 80.0 - 100.0 fL   MCH 23.8 (L) 26.0 - 34.0 pg   MCHC 30.8 30.0 - 36.0 g/dL   RDW 84.5 88.4 - 84.4 %   Platelets 369 150 - 400 K/uL   nRBC 0.0 0.0 - 0.2 %    Comment: Performed at [facility], [address] [place], [place] [address]  Comprehensive metabolic panel     Status: Abnormal   Collection Time: 02/20/23 10:58 AM  Result Value Ref Range   Sodium 138 135 - 145 mmol/L   Potassium 3.8 3.5 - 5.1 mmol/L   Chloride 104 98 - 111 mmol/L   CO2 23 22 - 32 mmol/L   Glucose, Bld 101 (H) 70 - 99 mg/dL    Comment: Glucose reference range applies only to samples taken after fasting for at least 8 hours.   BUN 14 8 - 23 mg/dL   Creatinine, Ser 8.88 (H) 0.44 - 1.00 mg/dL   Calcium  8.4 (L) 8.9 - 10.3 mg/dL   Total Protein 6.1 (L) 6.5 - 8.1 g/dL   Albumin  3.4 (L) 3.5 - 5.0 g/dL   AST 14 (L) 15 - 41 U/L   ALT 9 0 - 44 U/L   Alkaline Phosphatase 60 38 - 126 U/L   Total Bilirubin 0.6 0.0 - 1.2 mg/dL  GFR, Estimated 49 (L) >60 mL/min    Comment: (NOTE) Calculated using the CKD-EPI Creatinine Equation (2021)    Anion gap 11 5 - 15    Comment:  Performed at [facility], [address], [place], [place] [address]   MR LUMBAR SPINE WO CONTRAST Result Date: 02/19/2023 CLINICAL DATA:  Low back pain, cauda equina syndrome suspected EXAM: MRI LUMBAR SPINE WITHOUT CONTRAST TECHNIQUE: Multiplanar, multisequence MR imaging of the lumbar spine was performed. No intravenous contrast was administered. COMPARISON:  None Available. FINDINGS: Segmentation: Standard segmentation is assumed. The inferior-most fully formed intervertebral disc labeled L5-S1. Alignment: Grade 1 anterolisthesis of L4 on L5. Vertebrae: Degenerative/discogenic endplate signal changes at L4-L5 and T12-L1. No specific evidence of acute fracture or discitis/osteomyelitis. No suspicious bone lesions. Conus medullaris and cauda equina: Conus extends to the L1-L2 level. Conus appears normal. Paraspinal and other soft tissues: Atrophic right kidney, smaller than on the 2021 prior. Disc levels: T12-L1: Mild disc bulging with mild left foraminal stenosis, similar. No significant canal or right foraminal stenosis. L1-L2: Disc bulging and right greater than left facet arthropathy. Similar mild right greater than left foraminal stenosis. Mild canal stenosis. L2-L3: Mild disc bulging. Ligamentum flavum thickening facet arthropathy. Similar moderate right and mild left foraminal stenosis. Similar mild canal stenosis L3-L4: Disc bulging with bulky ligamentum flavum thickening. Bilateral facet arthropathy. Resulting mild to moderate right foraminal stenosis with progressive mild to moderate canal stenosis. Mild left foraminal stenosis. L4-L5: Grade 1 anterolisthesis. Uncovering the disc with superimposed superiorly directed large disc protrusion. Bulky ligamentum flavum thickening facet arthropathy. Resulting severe canal stenosis, which has progressed. Mild bilateral foraminal stenosis. L5-S1: Mild disc bulging. Moderate bilateral facet arthropathy. Similar moderate to severe left foraminal stenosis.  Patent canal and right foramen. IMPRESSION: 1. At L4-L5, new large disc protrusion with progressive severe canal stenosis. 2. Milder multilevel degenerative changes are detailed above. 3. Atrophic right kidney, smaller than on the 2021 prior. Electronically Signed   By: Gilmore GORMAN Molt M.D.   On: 02/19/2023 03:38   CT Lumbar Spine Wo Contrast Result Date: 02/18/2023 CLINICAL DATA:  Low back pain, increased fracture risk EXAM: CT LUMBAR SPINE WITHOUT CONTRAST TECHNIQUE: Multidetector CT imaging of the lumbar spine was performed without intravenous contrast administration. Multiplanar CT image reconstructions were also generated. RADIATION DOSE REDUCTION: This exam was performed according to the departmental dose-optimization program which includes automated exposure control, adjustment of the mA and/or kV according to patient size and/or use of iterative reconstruction technique. COMPARISON:  None Available. FINDINGS: Segmentation: 5 lumbar type vertebrae. Alignment: Trace anterolisthesis of L3 on L4. Grade 1 anterolisthesis of L4 on L5. Vertebrae: No acute fracture or focal pathologic process. Paraspinal and other soft tissues: Diverticulosis without evidence of diverticulitis. Aortic atherosclerotic calcifications. Status post cholecystectomy. The right kidney is small in size. Left kidney is without evidence of hydronephrosis or nephrolithiasis. Disc levels: Moderate spinal canal narrowing at C3-C4 secondary to a combination of a disc bulge and ligamentum flavum hypertrophy. There is likely a relatively large intraspinal synovial cyst at the L4-L5 level on the left. There is likely severe spinal canal stenosis at this level. Recommend further evaluation with a lumbar spine MRI. IMPRESSION: 1. No acute fracture or traumatic listhesis. 2. 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 Recommend further  evaluation with a lumbar spine MRI. Aortic Atherosclerosis (ICD10-I70.0). Electronically Signed   By: Lyndall  Meade M.D.   On: 02/18/2023 19:15    Pending Labs Unresulted Labs (From admission, onward)     Start     Ordered   02/20/23 0952  Surgical pcr screen  Once,   R        02/20/23 0951   02/19/23 0619  Protime-INR  Add-on,   AD        02/19/23 0619            Vitals/Pain Today's Vitals   02/20/23 0954 02/20/23 1000 02/20/23 1100 02/20/23 1200  BP: (!) 143/61 (!) 187/75 (!) 141/62 (!) 125/57  Pulse:  85 79 71  Resp:  14 13 14   Temp:    98 F (36.7 C)  TempSrc:      SpO2:  95% 97% 92%  PainSc:        Isolation Precautions No active isolations  Medications Medications  amLODipine  (NORVASC ) tablet 10 mg (10 mg Oral Given 02/20/23 0954)  carvedilol  (COREG ) tablet 12.5 mg (12.5 mg Oral Given 02/20/23 0842)  ezetimibe  (ZETIA ) tablet 10 mg (10 mg Oral Given 02/19/23 1014)  pravastatin  (PRAVACHOL ) tablet 40 mg (40 mg Oral Given 02/19/23 1914)  ALPRAZolam  (XANAX ) tablet 0.25 mg (0.25 mg Oral Given 02/19/23 2214)  sertraline  (ZOLOFT ) tablet 25 mg (25 mg Oral Given 02/19/23 1013)  famotidine  (PEPCID ) tablet 20 mg (20 mg Oral Given 02/20/23 0953)  pregabalin  (LYRICA ) capsule 75 mg (75 mg Oral Given 02/20/23 0953)  tiZANidine  (ZANAFLEX ) tablet 4 mg (has no administration in time range)  acetaminophen  (TYLENOL ) tablet 650 mg (has no administration in time range)    Or  acetaminophen  (TYLENOL ) suppository 650 mg (has no administration in time range)  fentaNYL  (SUBLIMAZE ) injection 12.5-25 mcg (25 mcg Intravenous Given 02/20/23 0416)  HYDROcodone -acetaminophen  (NORCO/VICODIN) 5-325 MG per tablet 1-2 tablet (1 tablet Oral Given 02/19/23 1447)  ondansetron  (ZOFRAN ) tablet 4 mg (has no administration in time range)    Or  ondansetron  (ZOFRAN ) injection 4 mg (has no administration in time range)  senna-docusate (Senokot-S) tablet 1 tablet (1 tablet Oral Given 02/19/23 2214)  losartan  (COZAAR ) tablet  25 mg (25 mg Oral Given 02/20/23 0954)  levalbuterol  (XOPENEX ) nebulizer solution 0.63 mg (has no administration in time range)  ferrous sulfate  tablet 325 mg (325 mg Oral Given 02/20/23 0842)  senna-docusate (Senokot-S) tablet 1 tablet (1 tablet Oral Given 02/20/23 0953)  fentaNYL  (SUBLIMAZE ) injection 50 mcg (50 mcg Intravenous Given 02/18/23 1836)  amLODipine  (NORVASC ) tablet 10 mg (10 mg Oral Given 02/18/23 1839)  HYDROmorphone  (DILAUDID ) injection 0.5 mg (0.5 mg Intravenous Given 02/18/23 2006)  LORazepam  (ATIVAN ) injection 0.5 mg (0.5 mg Intravenous Given 02/18/23 2100)  LORazepam  (ATIVAN ) injection 0.5 mg (0.5 mg Intravenous Given 02/19/23 0250)  HYDROmorphone  (DILAUDID ) injection 0.5 mg (0.5 mg Intravenous Given 02/19/23 0254)    Mobility Walks, but choose not to d/t pain     Focused Assessments Back pain   R Recommendations: See Admitting Provider Note  Report given to:   Additional Notes:

## 2023-02-20 NOTE — ED Notes (Signed)
 The daughter remains at  the bedside

## 2023-02-20 NOTE — Anesthesia Preprocedure Evaluation (Addendum)
 Anesthesia Evaluation  Patient identified by MRN, date of birth, ID band Patient awake    Reviewed: Allergy & Precautions, NPO status , Patient's Chart, lab work & pertinent test results  Airway Mallampati: III  TM Distance: >3 FB Neck ROM: Full    Dental  (+) Teeth Intact, Dental Advisory Given   Pulmonary former smoker   breath sounds clear to auscultation       Cardiovascular hypertension, Pt. on medications and Pt. on home beta blockers + CAD, + Past MI, + Cardiac Stents and + Peripheral Vascular Disease   Rhythm:Regular Rate:Normal + Systolic murmurs Echo (2019):  - Left ventricle: The cavity size was normal. There was moderate    concentric hypertrophy. Systolic function was normal. The    estimated ejection fraction was in the range of 60% to 65%.    Features are consistent with a pseudonormal left ventricular    filling pattern, with concomitant abnormal relaxation and    increased filling pressure (grade 2 diastolic dysfunction).    Doppler parameters are consistent with both elevated ventricular    end-diastolic filling pressure and elevated left atrial filling    pressure.  - Aortic valve: Mildly calcified annulus. Trileaflet; mildly    calcified leaflets. Valve mobility was mildly restricted. 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 Mean gradient (S): 7 mm Hg. Peak gradient (S):    14 mm Hg. Valve area (VTI): 0.88 cm^2. Valve area (Vmax): 1.02    cm^2. Valve area (Vmean): 0.96 cm^2.  - Mitral valve: Calcified annulus. There was mild regurgitation.  - Left atrium: The atrium was mildly dilated.  - Right ventricle: The cavity size was mildly dilated. There was    mild hypertrophy.  - Right atrium: The atrium was mildly dilated.  - Atrial septum: No defect or patent foramen ovale was identified.  - Pulmonary arteries: Moderate pulmoanry hypertension. PA peak    pressure: 40 mm Hg (S) (RA 3 mm Hg).     Neuro/Psych  Headaches  PSYCHIATRIC DISORDERS Anxiety        GI/Hepatic Neg liver ROS,GERD  Medicated,,  Endo/Other  negative endocrine ROS    Renal/GU Renal disease     Musculoskeletal  (+) Arthritis ,    Abdominal   Peds  Hematology negative hematology ROS (+)   Anesthesia Other Findings   Reproductive/Obstetrics                             Anesthesia Physical Anesthesia Plan  ASA: 3  Anesthesia Plan: General   Post-op Pain Management: Tylenol  PO (pre-op)*   Induction: Intravenous  PONV Risk Score and Plan: 4 or greater and Ondansetron  and Treatment may vary due to age or medical condition  Airway Management Planned: Oral ETT  Additional Equipment: None  Intra-op Plan:   Post-operative Plan: Extubation in OR  Informed Consent:   Plan Discussed with: CRNA  Anesthesia Plan Comments:        Anesthesia Quick Evaluation

## 2023-02-20 NOTE — Anesthesia Procedure Notes (Signed)
 Procedure Name: Intubation Date/Time: 02/20/2023 4:00 PM  Performed by: Delores Dus, CRNAPre-anesthesia Checklist: Patient identified, Emergency Drugs available, Suction available and Patient being monitored Patient Re-evaluated:Patient Re-evaluated prior to induction Oxygen  Delivery Method: Circle system utilized Preoxygenation: Pre-oxygenation with 100% oxygen  Induction Type: IV induction Ventilation: Mask ventilation without difficulty Laryngoscope Size: Miller and 2 Grade View: Grade I Tube type: Oral Tube size: 7.0 mm Number of attempts: 1 Airway Equipment and Method: Stylet and Oral airway Placement Confirmation: ETT inserted through vocal cords under direct vision, positive ETCO2 and breath sounds checked- equal and bilateral Secured at: 22 cm Tube secured with: Tape Dental Injury: Teeth and Oropharynx as per pre-operative assessment

## 2023-02-20 NOTE — Op Note (Signed)
 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 Gerldine Maizes, MD  ASSISTANT: Dr. Victory Gunnels, MD  ANESTHESIA: General Endotracheal  EBL: 200cc  SPECIMENS: None  DRAINS: None  COMPLICATIONS: None immediate  CONDITION: Hemodynamically stable to PACU  HISTORY: Kathryn [name] is a 83 y.o. female presenting to the emergency department with severe exacerbation of more chronic back and bilateral leg pain.  Her imaging revealed severe stenosis with likely acute herniated disc at L4-5.  I therefore recommended surgical decompression.  The risks, benefits, and alternatives to surgery were all reviewed in detail with the patient and her family.  After all questions were answered informed consent was obtained and witnessed.  PROCEDURE IN DETAIL: The patient was brought to the operating room. After induction of general anesthesia, the patient was positioned on the operative table in the prone position. All pressure points were meticulously padded. Skin incision was then marked out and prepped and draped in the usual sterile fashion.  After timeout was conducted, spinal needle was introduced in a paramedian fashion to identify the surface projection of the L4-5 interspace.  The midline skin incision was then infiltrated with local anesthetic.  Incision was then made sharply and carried down through subcutaneous tissue until the lumbodorsal fascia was identified and incised in the midline.  The L4 and L5 lamina was then identified and subperiosteal dissection was carried out.  Self-retaining retractor was then placed.  A dissector was placed in the L4-5 interspace and another intraoperative x-ray was used to confirm our location at the correct level.  At this point the microscope was draped sterilely and  brought into the field and the remainder of the decompression was done under the microscope using microdissection technique.  Initially, rongeur's were used to remove the spinous process at L4.  The high-speed drill was then used to complete a central laminectomy at this level.  I extended the laminectomy inferiorly to include the superior part of L5.  I was able to identify normal dura at the top of L5 and the bottom of L3-4.  I then dissected in the epidural plane and using a combination of Kerrison punches, removed severely hypertrophic facets at L4-5.  This included significant hypertrophy of the ligamentum flavum bilaterally.  Once this was done, there was good decompression of the thecal sac dorsally.  The high-speed drill was used to remove a few millimeters of the medial aspect of the facet complex in order to fully decompress the thecal sac laterally.  I was then able to place a blunt ball dissector within the ventral epidural space.  There did appear to be a subligamentous disc herniation on the patient's left side at L4-5.  The posterior longitudinal ligament was therefore incised, and using a combination of ball dissectors and rongeur's, multiple large disc fragments were removed.  This allowed good decompression of the thecal sac ventrally.  At this point hemostasis was easily secured using primarily morselized Gelfoam with thrombin  and the bipolar electrocautery.  The thecal sac was then covered with a long-acting steroid solution.  Self-retaining retractor was then removed.  The wound was then closed in multiple layers in standard fashion using a combination of interrupted 0 and 3-0 Vicryl stitches.  A layer of Dermabond was applied for skin closure.  At the end of the case all sponge, needle, instrument, and cottonoid counts are  correct.  Patient was then transferred to the stretcher and extubated.  She was taken to the postanesthesia care unit in stable hemodynamic condition.  Gerldine Maizes, MD [facility]

## 2023-02-21 ENCOUNTER — Encounter (HOSPITAL_COMMUNITY): Payer: Self-pay | Admitting: Neurosurgery

## 2023-02-21 DIAGNOSIS — M5126 Other intervertebral disc displacement, lumbar region: Secondary | ICD-10-CM | POA: Diagnosis not present

## 2023-02-21 LAB — CBC
HCT: 35.6 % — ABNORMAL LOW (ref 36.0–46.0)
Hemoglobin: 11.2 g/dL — ABNORMAL LOW (ref 12.0–15.0)
MCH: 23.6 pg — ABNORMAL LOW (ref 26.0–34.0)
MCHC: 31.5 g/dL (ref 30.0–36.0)
MCV: 75.1 fL — ABNORMAL LOW (ref 80.0–100.0)
Platelets: 367 10*3/uL (ref 150–400)
RBC: 4.74 MIL/uL (ref 3.87–5.11)
RDW: 14.7 % (ref 11.5–15.5)
WBC: 13.6 10*3/uL — ABNORMAL HIGH (ref 4.0–10.5)
nRBC: 0 % (ref 0.0–0.2)

## 2023-02-21 LAB — BASIC METABOLIC PANEL
Anion gap: 10 (ref 5–15)
BUN: 14 mg/dL (ref 8–23)
CO2: 25 mmol/L (ref 22–32)
Calcium: 8.5 mg/dL — ABNORMAL LOW (ref 8.9–10.3)
Chloride: 104 mmol/L (ref 98–111)
Creatinine, Ser: 1.01 mg/dL — ABNORMAL HIGH (ref 0.44–1.00)
GFR, Estimated: 55 mL/min — ABNORMAL LOW (ref >60)
Glucose, Bld: 147 mg/dL — ABNORMAL HIGH (ref 70–99)
Potassium: 4 mmol/L (ref 3.5–5.1)
Sodium: 139 mmol/L (ref 135–145)

## 2023-02-21 MED ORDER — LORATADINE 10 MG PO TABS
10.0000 mg | ORAL_TABLET | Freq: Every day | ORAL | Status: DC | PRN
  Administered 2023-02-21: 10 mg via ORAL
  Filled 2023-02-21: qty 1

## 2023-02-21 MED ORDER — PANTOPRAZOLE SODIUM 40 MG PO TBEC
40.0000 mg | DELAYED_RELEASE_TABLET | Freq: Every day | ORAL | Status: DC
  Administered 2023-02-21 – 2023-02-23 (×3): 40 mg via ORAL
  Filled 2023-02-21 (×3): qty 1

## 2023-02-21 MED FILL — Thrombin For Soln 5000 Unit: CUTANEOUS | Qty: 2 | Status: AC

## 2023-02-21 NOTE — Progress Notes (Signed)
  NEUROSURGERY PROGRESS NOTE   No issues overnight. Pt reports significant improvement in back/leg pain postop. Able to walk to bathroom with rolling walker.  EXAM:  BP (!) 147/64 (BP Location: Right Arm)   Pulse 60   Temp 97.7 F (36.5 C) (Oral)   Resp 16   Ht 5' 5 (1.651 m)   Wt 88.5 kg   SpO2 98%   BMI 32.45 kg/m   Awake, alert, oriented  Speech fluent, appropriate  CN grossly intact  5/5 BUE/BLE   IMPRESSION:  83 y.o. female POD#1 L4-5 laminectomy/microdiscectomy with significant improvement in preop back/leg pain.  PLAN: - PT/OT evals - Dispo planning pending above   Gerldine Maizes, MD [facility]

## 2023-02-21 NOTE — Progress Notes (Signed)
 PROGRESS NOTE    Kathryn [name]  FMW:[id number] DOB: January 04, 1941 DOA: 02/18/2023 PCP: Jarold Medici, MD  83/F with history of CAD, remote PCI in 2009, CKD 2, bilateral carotid artery stenosis, anxiety, hypertension, peripheral vascular disease, chronic back pain presented to the ED 1/5 with worsening low back pain for 3 weeks but acutely worse X 2 days with limited mobility.  In the ED hypertensive, labs largely unremarkable, MRI lumbar spine with large disc protrusion at L4-L5 with progressive severe canal stenosis -12/6 underwent L4-L5 laminectomy/microdiscectomy   Subjective: -Feels fair, some improvement in pain, overall improving  Assessment and Plan:  Worsening low back pain Large lumbar disc protrusion at L4-L5 with severe canal stenosis -Continue Vicodin and fentanyl  for severe pain -Neurosurgery Dr. Lanis following, underwent for lumbar laminectomy and microdiscectomy 1/6 -PT OT, increase activity  Essential hypertension -Continue amlodipine , Coreg , losartan   CKD 2 -Stable  History of CAD Carotid artery disease -Remote PCI, stable continue Coreg  and statin, aspirin  on hold  History of anxiety -Xanax  resumed  Reactive airway disease Continue Xopenex  as needed  Chronic microcytic anemia Stable  DVT prophylaxis: SCDs Code Status: Full code Family Communication: Daughters at bedside yesterday Disposition Plan: To be determined may need rehab  Consultants:    Procedures:   Antimicrobials:    Objective: Vitals:   02/21/23 0446 02/21/23 0813 02/21/23 0850 02/21/23 0947  BP: 131/69 (!) 147/64 (!) 147/64 (!) 147/64  Pulse: 81 60 60   Resp: 16 16    Temp: (!) 97.5 F (36.4 C) 97.7 F (36.5 C)    TempSrc: Oral Oral    SpO2: 98% 98%    Weight:      Height:        Intake/Output Summary (Last 24 hours) at 02/21/2023 1220 Last data filed at 02/21/2023 0600 Gross per 24 hour  Intake 1638.32 ml  Output 200 ml  Net 1438.32 ml   Filed Weights    02/20/23 1347  Weight: 88.5 kg    Examination:  Gen: Awake, Alert, Oriented X 3,  HEENT: no JVD Lungs: Good air movement bilaterally, CTAB CVS: S1S2/RRR Abd: soft, Non tender, non distended, BS present Extremities: No edema Skin: no new rashes on exposed skin  Neuro: Moves all extremities    Data Reviewed:   CBC: Recent Labs  Lab 02/18/23 1841 02/20/23 1058 02/21/23 0608  WBC 8.6 11.3* 13.6*  HGB 12.0 12.3 11.2*  HCT 37.5 39.9 35.6*  MCV 73.8* 77.2* 75.1*  PLT 418* 369 367   Basic Metabolic Panel: Recent Labs  Lab 02/18/23 1841 02/20/23 1058 02/21/23 0608  NA 143 138 139  K 3.5 3.8 4.0  CL 106 104 104  CO2 27 23 25   GLUCOSE 79 101* 147*  BUN 9 14 14   CREATININE 0.83 1.11* 1.01*  CALCIUM  8.5* 8.4* 8.5*   GFR: Estimated Creatinine Clearance: 46.4 mL/min (A) (by C-G formula based on SCr of 1.01 mg/dL (H)). Liver Function Tests: Recent Labs  Lab 02/20/23 1058  AST 14*  ALT 9  ALKPHOS 60  BILITOT 0.6  PROT 6.1*  ALBUMIN  3.4*   No results for input(s): LIPASE, AMYLASE in the last 168 hours. No results for input(s): AMMONIA in the last 168 hours. Coagulation Profile: No results for input(s): INR, PROTIME in the last 168 hours. Cardiac Enzymes: No results for input(s): CKTOTAL, CKMB, CKMBINDEX, TROPONINI in the last 168 hours. BNP (last 3 results) No results for input(s): PROBNP in the last 8760 hours. HbA1C: No results for input(s): HGBA1C  in the last 72 hours. CBG: No results for input(s): GLUCAP in the last 168 hours. Lipid Profile: No results for input(s): CHOL, HDL, LDLCALC, TRIG, CHOLHDL, LDLDIRECT in the last 72 hours. Thyroid  Function Tests: No results for input(s): TSH, T4TOTAL, FREET4, T3FREE, THYROIDAB in the last 72 hours. Anemia Panel: No results for input(s): VITAMINB12, FOLATE, FERRITIN, TIBC, IRON, RETICCTPCT in the last 72 hours. Urine analysis:    Component Value  Date/Time   COLORURINE STRAW (A) 01/14/2018 1500   APPEARANCEUR CLEAR 01/14/2018 1500   LABSPEC 1.005 01/14/2018 1500   PHURINE 6.0 01/14/2018 1500   GLUCOSEU NEGATIVE 01/14/2018 1500   HGBUR SMALL (A) 01/14/2018 1500   BILIRUBINUR negative 03/23/2022 1647   KETONESUR NEGATIVE 01/14/2018 1500   PROTEINUR Positive (A) 03/23/2022 1647   PROTEINUR NEGATIVE 01/14/2018 1500   UROBILINOGEN 0.2 03/23/2022 1647   UROBILINOGEN 0.2 03/08/2011 0931   NITRITE negative 03/23/2022 1647   NITRITE NEGATIVE 01/14/2018 1500   LEUKOCYTESUR Negative 03/23/2022 1647   Sepsis Labs: @LABRCNTIP (procalcitonin:4,lacticidven:4)  ) Recent Results (from the past 240 hours)  Surgical pcr screen     Status: None   Collection Time: 02/20/23  9:52 AM   Specimen: Nasal Mucosa; Nasal Swab  Result Value Ref Range Status   MRSA, PCR NEGATIVE NEGATIVE Final   Staphylococcus aureus NEGATIVE NEGATIVE Final    Comment: (NOTE) The Xpert SA Assay (FDA approved for NASAL specimens in patients 58 years of age and older), is one component of a comprehensive surveillance program. It is not intended to diagnose infection nor to guide or monitor treatment. Performed at [facility], [address], [place], [place] [address]      Radiology Studies: DG Lumbar Spine 2-3 Views Result Date: 02/20/2023 CLINICAL DATA:  Intraoperative localization for lumbar laminectomy and microdiskectomy at level L4-5. Elective surgery. EXAM: LUMBAR SPINE - 2-3 VIEW COMPARISON:  CT lumbar spine 02/18/2023 FINDINGS: Three cross-table lateral images of the lumbar spine are obtained intraoperatively for surgical localization purposes. Image labeled film 1 demonstrates a needle positioned over the spinous process of L4. The image labeled film 2 demonstrates a localization marker posterior to the body of L4. The image labeled film 3 demonstrates retractors projecting over the L4 spinous process with a localization marker posterior to the L4-5  interspace. Mild anterior subluxation of L4-5 is noted as seen on prior CT. IMPRESSION: Intraoperative cross-table lateral views of the lumbar spine are obtained for surgical localization purposes. Electronically Signed   By: Elsie Gravely M.D.   On: 02/20/2023 20:05   Scheduled Meds:  amLODipine   10 mg Oral Daily   carvedilol   12.5 mg Oral BID WC   ezetimibe   10 mg Oral Daily   famotidine   20 mg Oral Daily   ferrous sulfate   325 mg Oral Q breakfast   losartan   25 mg Oral Daily   pantoprazole   40 mg Oral QHS   pravastatin   40 mg Oral QPM   pregabalin   75 mg Oral TID   senna-docusate  1 tablet Oral BID   sertraline   25 mg Oral Daily   Continuous Infusions:   LOS: 2 days    Time spent:    Sigurd Pac, MD [facility]   02/21/2023, 12:20 PM

## 2023-02-21 NOTE — Progress Notes (Signed)
 PT Cancellation Note  Patient Details Name: Kathryn [name] MRN: [id number] DOB: 01/31/1941   Cancelled Treatment:    Reason Eval/Treat Not Completed: Fatigue/lethargy limiting ability to participate; patient up with OT this am and just now back to bed with nursing to start IV.  PT will follow up in pm.   Montie Portal 02/21/2023, 10:06 AM Micheline Portal, PT Acute Rehabilitation Services [phone] 02/21/2023

## 2023-02-21 NOTE — Evaluation (Signed)
 Physical Therapy Evaluation Patient Details Name: Kathryn [name] MRN: [id number] DOB: Jun 29, 1940 Today's Date: 02/21/2023  History of Present Illness  Pt is a 83 y/o female presenting on 1/4 with back pain. MRI lumbar spine with large disc protrusion at L4-5 with progressive severe canal stenosis.  1/6 S/P decompression with laminectomy/microdiscectomy L4-5. PMH includes: anxiety, arthritis, CAD, dysphagia, PVD, sciatica, MI.  Clinical Impression  Patient presents with decreased mobility due to pain, limited activity tolerance, decreased knowledge of precautions, decreased balance and generalized weakness.  Previously patient independent though needing cane then rollator just prior to admission due to pain.  Patient needing CGA for ambulation in hallway and min A for sit to stand.  Patient will benefit from skilled PT in the acute setting and from inpatient rehab (<3 hours/day) prior to d/c home.         If plan is discharge home, recommend the following: A little help with walking and/or transfers;A little help with bathing/dressing/bathroom;Help with stairs or ramp for entrance;Direct supervision/assist for medications management;Assistance with cooking/housework;Assist for transportation   Can travel by private vehicle   Yes    Equipment Recommendations Rolling walker (2 wheels)  Recommendations for Other Services       Functional Status Assessment Patient has had a recent decline in their functional status and demonstrates the ability to make significant improvements in function in a reasonable and predictable amount of time.     Precautions / Restrictions Precautions Precautions: Fall;Back      Mobility  Bed Mobility               General bed mobility comments: up in recliner    Transfers Overall transfer level: Needs assistance Equipment used: Rolling walker (2 wheels) Transfers: Sit to/from Stand Sit to Stand: Min assist           General transfer  comment: cues for hand placement    Ambulation/Gait Ambulation/Gait assistance: Contact guard assist Gait Distance (Feet): 100 Feet Assistive device: Rolling walker (2 wheels) Gait Pattern/deviations: Step-through pattern, Decreased stride length, Shuffle       General Gait Details: slow pace, assist for walker on turns  Stairs            Wheelchair Mobility     Tilt Bed    Modified Rankin (Stroke Patients Only)       Balance Overall balance assessment: Needs assistance   Sitting balance-Leahy Scale: Fair     Standing balance support: Bilateral upper extremity supported Standing balance-Leahy Scale: Poor Standing balance comment: UE support for balance                             Pertinent Vitals/Pain Pain Assessment Pain Assessment: 0-10 Pain Score: 5  Pain Location: back Pain Descriptors / Indicators: Discomfort, Grimacing, Operative site guarding Pain Intervention(s): Monitored during session, Premedicated before session    Home Living Family/patient expects to be discharged to:: Private residence Living Arrangements: Alone Available Help at Discharge: Family;Available PRN/intermittently Type of Home: House Home Access: Stairs to enter Entrance Stairs-Rails: Right Entrance Stairs-Number of Steps: 2 Alternate Level Stairs-Number of Steps: chair lift upstairs; pt's room on main Home Layout: Two level;Full bath on main level;Able to live on main level with bedroom/bathroom Home Equipment: Shower seat - built in;Hand held Stage Manager (4 wheels);Electric scooter      Prior Function Prior Level of Function : Independent/Modified Independent;Driving;History of Falls (last six months) (1 fall in 6 months)  Mobility Comments: recent decline with pain moving from cane to RW       Extremity/Trunk Assessment   Upper Extremity Assessment Upper Extremity Assessment: Defer to OT evaluation    Lower Extremity  Assessment Lower Extremity Assessment: Generalized weakness    Cervical / Trunk Assessment Cervical / Trunk Assessment: Back Surgery  Communication   Communication Communication: No apparent difficulties  Cognition Arousal: Alert Behavior During Therapy: WFL for tasks assessed/performed Overall Cognitive Status: Within Functional Limits for tasks assessed                                          General Comments General comments (skin integrity, edema, etc.): daughter in the room, supportive, requesting rehab prior to home alone    Exercises     Assessment/Plan    PT Assessment Patient needs continued PT services  PT Problem List Pain;Decreased mobility;Decreased knowledge of use of DME;Decreased safety awareness;Decreased activity tolerance;Decreased knowledge of precautions;Decreased strength       PT Treatment Interventions DME instruction;Therapeutic activities;Therapeutic exercise;Gait training;Patient/family education;Balance training;Stair training;Functional mobility training    PT Goals (Current goals can be found in the Care Plan section)  Acute Rehab PT Goals Patient Stated Goal: return home/independent PT Goal Formulation: With patient/family Time For Goal Achievement: 03/07/23 Potential to Achieve Goals: Good    Frequency Min 1X/week     Co-evaluation               AM-PAC PT 6 Clicks Mobility  Outcome Measure Help needed turning from your back to your side while in a flat bed without using bedrails?: A Little Help needed moving from lying on your back to sitting on the side of a flat bed without using bedrails?: A Little Help needed moving to and from a bed to a chair (including a wheelchair)?: A Little Help needed standing up from a chair using your arms (e.g., wheelchair or bedside chair)?: A Little Help needed to walk in hospital room?: A Little Help needed climbing 3-5 steps with a railing? : Total 6 Click Score: 16     End of Session Equipment Utilized During Treatment: Gait belt Activity Tolerance: Patient tolerated treatment well Patient left: in chair;with call bell/phone within reach;with family/visitor present Nurse Communication: Mobility status PT Visit Diagnosis: Other abnormalities of gait and mobility (R26.89);Difficulty in walking, not elsewhere classified (R26.2);Pain Pain - part of body:  (back)    Time: 1341-1405 PT Time Calculation (min) (ACUTE ONLY): 24 min   Charges:   PT Evaluation $PT Eval Moderate Complexity: 1 Mod PT Treatments $Gait Training: 8-22 mins PT General Charges $$ ACUTE PT VISIT: 1 Visit         Micheline Portal, PT Acute Rehabilitation Services Office:[phone] 02/21/2023   Montie Portal 02/21/2023, 2:15 PM

## 2023-02-21 NOTE — Plan of Care (Signed)
  Problem: Health Behavior/Discharge Planning: Goal: Ability to manage health-related needs will improve Outcome: Progressing   Problem: Clinical Measurements: Goal: Will remain free from infection Outcome: Progressing   Problem: Activity: Goal: Risk for activity intolerance will decrease Outcome: Progressing   Problem: Coping: Goal: Level of anxiety will decrease Outcome: Progressing   Problem: Pain Management: Goal: General experience of comfort will improve Outcome: Progressing   Problem: Education: Goal: Ability to verbalize activity precautions or restrictions will improve Outcome: Progressing Goal: Knowledge of the prescribed therapeutic regimen will improve Outcome: Progressing Goal: Understanding of discharge needs will improve Outcome: Progressing   Problem: Activity: Goal: Ability to avoid complications of mobility impairment will improve Outcome: Progressing Goal: Ability to tolerate increased activity will improve Outcome: Progressing Goal: Will remain free from falls Outcome: Progressing   Problem: Bowel/Gastric: Goal: Gastrointestinal status for postoperative course will improve Outcome: Progressing   Problem: Clinical Measurements: Goal: Ability to maintain clinical measurements within normal limits will improve Outcome: Progressing Goal: Postoperative complications will be avoided or minimized Outcome: Progressing Goal: Diagnostic test results will improve Outcome: Progressing   Problem: Pain Management: Goal: Pain level will decrease Outcome: Progressing   Problem: Skin Integrity: Goal: Will show signs of wound healing Outcome: Progressing   Problem: Health Behavior/Discharge Planning: Goal: Identification of resources available to assist in meeting health care needs will improve Outcome: Progressing   Problem: Bladder/Genitourinary: Goal: Urinary functional status for postoperative course will improve Outcome: Progressing

## 2023-02-21 NOTE — Progress Notes (Signed)
 EOS: A&Ox4, x1 assist w/ walker to restroom, adequate UOP overnight, pain controlled w/ PRN's adequately

## 2023-02-21 NOTE — Plan of Care (Signed)
  Problem: Education: Goal: Knowledge of General Education information will improve Description: Including pain rating scale, medication(s)/side effects and non-pharmacologic comfort measures Outcome: Progressing   Problem: Clinical Measurements: Goal: Will remain free from infection Outcome: Progressing   Problem: Activity: Goal: Risk for activity intolerance will decrease Outcome: Progressing   Problem: Pain Management: Goal: General experience of comfort will improve Outcome: Progressing

## 2023-02-21 NOTE — Evaluation (Signed)
 Occupational Therapy Evaluation Patient Details Name: Kathryn [name] MRN: [id number] DOB: 04-Jun-1940 Today's Date: 02/21/2023   History of Present Illness Pt is a 83 y/o female presenting on 1/4 with back pain. MRI lumbar spine with large disc protrusion at L4-5 with progressive severe canal stenosis.  1/6 S/P decompression with laminectomy/microdiscectomy L4-5. PMH includes: anxiety, arthritis, CAD, dysphagia, PVD, sciatica, MI.   Clinical Impression   Patient admitted for above and presents with problem list below.  PTA pt was independent with ADLs and mobility, but reports decreased IADL and activity performance prior to admission due to pain. Patient was educated on back precautions, ADL compensatory techniques, AE/DME, mobility progression, safety and recommendations.  Today, pt demonstrated ability to complete bed mobility with min assist, transfers using RW with min assist, functional mobility using RW with min assist, and ADLs with up to total assist for LB and setup to UB seated ADLs.  At discharge, pt will have support from family PRN.  Based on performance today, believe pt will best benefit from continued OT services acutely and after dc at SNF level to optimize independence, safety and return to PLOF.  OT will follow.        If plan is discharge home, recommend the following: A little help with walking and/or transfers;A lot of help with bathing/dressing/bathroom;Assistance with cooking/housework;Assist for transportation;Help with stairs or ramp for entrance    Functional Status Assessment  Patient has had a recent decline in their functional status and demonstrates the ability to make significant improvements in function in a reasonable and predictable amount of time.  Equipment Recommendations  BSC/3in1;Other (comment) (RW)    Recommendations for Other Services       Precautions / Restrictions Precautions Precautions: Fall;Back Precaution Booklet Issued: Yes  (comment) Precaution Comments: reviewed with pt Required Braces or Orthoses:  (no brace needed order) Restrictions Weight Bearing Restrictions Per Provider Order: No      Mobility Bed Mobility Overal bed mobility: Needs Assistance Bed Mobility: Rolling, Sidelying to Sit Rolling: Min assist Sidelying to sit: Min assist       General bed mobility comments: increased time and cueing for technique    Transfers Overall transfer level: Needs assistance Equipment used: Rolling walker (2 wheels) Transfers: Sit to/from Stand Sit to Stand: Min assist           General transfer comment: min assist to power up, pt tends to pull on RW even after cued to push up from bed.  CUeing for posture      Balance Overall balance assessment: Needs assistance Sitting-balance support: No upper extremity supported, Feet supported Sitting balance-Leahy Scale: Fair     Standing balance support: Bilateral upper extremity supported, During functional activity Standing balance-Leahy Scale: Poor Standing balance comment: relies on BUE support                           ADL either performed or assessed with clinical judgement   ADL Overall ADL's : Needs assistance/impaired     Grooming: Set up;Sitting           Upper Body Dressing : Set up;Sitting   Lower Body Dressing: Total assistance;Sit to/from stand Lower Body Dressing Details (indicate cue type and reason): pt requires assist to thread underwaer and manage socks, standing needs assist to pull over hips as she relies on BUE support Toilet Transfer: Minimal assistance;Ambulation;Rolling walker (2 wheels) Toilet Transfer Details (indicate cue type and reason): several steps to  recliner         Functional mobility during ADLs: Minimal assistance;Rolling walker (2 wheels);Cueing for sequencing;Cueing for safety       Vision   Vision Assessment?: No apparent visual deficits     Perception         Praxis          Pertinent Vitals/Pain Pain Assessment Pain Assessment: Faces Faces Pain Scale: Hurts little more Pain Location: back Pain Descriptors / Indicators: Discomfort, Grimacing, Operative site guarding Pain Intervention(s): Limited activity within patient's tolerance, Monitored during session, Repositioned     Extremity/Trunk Assessment Upper Extremity Assessment Upper Extremity Assessment: Generalized weakness   Lower Extremity Assessment Lower Extremity Assessment: Defer to PT evaluation   Cervical / Trunk Assessment Cervical / Trunk Assessment: Back Surgery   Communication Communication Communication: No apparent difficulties   Cognition Arousal: Alert Behavior During Therapy: WFL for tasks assessed/performed Overall Cognitive Status: Within Functional Limits for tasks assessed                                       General Comments  daughter at side and supportive    Exercises     Shoulder Instructions      Home Living Family/patient expects to be discharged to:: Private residence Living Arrangements: Alone Available Help at Discharge: Family;Available PRN/intermittently (looking at hiring a caregiver) Type of Home: House Home Access: Stairs to enter [employer] of Steps: 2 Entrance Stairs-Rails: Right Home Layout: Two level;Bed/bath upstairs;Full bath on main level;Able to live on main level with bedroom/bathroom Alternate Level Stairs-Number of Steps: chair lift upstairs   Bathroom Shower/Tub: Walk-in shower;Tub only   Bathroom Toilet: Handicapped height     Home Equipment: Shower seat - built in;Hand held Stage Manager (4 wheels);Electric scooter          Prior Functioning/Environment Prior Level of Function : Independent/Modified Independent;Driving;History of Falls (last six months) (1 fall in the last 6 monhts)               ADLs Comments: independent ADLs, light IADLs, driving.        OT Problem List:  Decreased strength;Decreased activity tolerance;Impaired balance (sitting and/or standing);Pain;Decreased knowledge of precautions;Decreased knowledge of use of DME or AE;Obesity      OT Treatment/Interventions: Self-care/ADL training;Therapeutic exercise;DME and/or AE instruction;Therapeutic activities;Patient/family education;Balance training    OT Goals(Current goals can be found in the care plan section) Acute Rehab OT Goals Patient Stated Goal: get better OT Goal Formulation: With patient Time For Goal Achievement: 03/07/23 Potential to Achieve Goals: Good ADL Goals Pt Will Perform Grooming: with modified independence;standing Pt Will Perform Lower Body Dressing: with modified independence;sit to/from stand;with adaptive equipment Pt Will Transfer to Toilet: with modified independence;ambulating;bedside commode Pt Will Perform Toileting - Clothing Manipulation and hygiene: with modified independence;sit to/from stand  OT Frequency: Min 1X/week    Co-evaluation              AM-PAC OT 6 Clicks Daily Activity     Outcome Measure Help from another person eating meals?: None Help from another person taking care of personal grooming?: A Little Help from another person toileting, which includes using toliet, bedpan, or urinal?: A Lot Help from another person bathing (including washing, rinsing, drying)?: A Lot Help from another person to put on and taking off regular upper body clothing?: A Little Help from another person to put on and taking  off regular lower body clothing?: A Lot 6 Click Score: 16   End of Session Equipment Utilized During Treatment: Gait belt;Rolling walker (2 wheels) Nurse Communication: Mobility status  Activity Tolerance: Patient tolerated treatment well Patient left: in chair;with call bell/phone within reach;with chair alarm set;with family/visitor present  OT Visit Diagnosis: Other abnormalities of gait and mobility (R26.89);Muscle weakness  (generalized) (M62.81);Pain Pain - part of body:  (back)                Time: 9141-9070 OT Time Calculation (min): 31 min Charges:  OT General Charges $OT Visit: 1 Visit OT Evaluation $OT Eval Moderate Complexity: 1 Mod OT Treatments $Self Care/Home Management : 8-22 mins  Etta NOVAK, OT Acute Rehabilitation Services Office [phone]   Etta GORMAN Hope 02/21/2023, 10:50 AM

## 2023-02-22 DIAGNOSIS — M5126 Other intervertebral disc displacement, lumbar region: Secondary | ICD-10-CM | POA: Diagnosis not present

## 2023-02-22 LAB — BASIC METABOLIC PANEL
Anion gap: 15 (ref 5–15)
BUN: 25 mg/dL — ABNORMAL HIGH (ref 8–23)
CO2: 25 mmol/L (ref 22–32)
Calcium: 8.6 mg/dL — ABNORMAL LOW (ref 8.9–10.3)
Chloride: 104 mmol/L (ref 98–111)
Creatinine, Ser: 1.18 mg/dL — ABNORMAL HIGH (ref 0.44–1.00)
GFR, Estimated: 46 mL/min — ABNORMAL LOW (ref >60)
Glucose, Bld: 109 mg/dL — ABNORMAL HIGH (ref 70–99)
Potassium: 3.8 mmol/L (ref 3.5–5.1)
Sodium: 144 mmol/L (ref 135–145)

## 2023-02-22 LAB — CBC
HCT: 29.4 % — ABNORMAL LOW (ref 36.0–46.0)
Hemoglobin: 9.5 g/dL — ABNORMAL LOW (ref 12.0–15.0)
MCH: 24.2 pg — ABNORMAL LOW (ref 26.0–34.0)
MCHC: 32.3 g/dL (ref 30.0–36.0)
MCV: 74.8 fL — ABNORMAL LOW (ref 80.0–100.0)
Platelets: 321 10*3/uL (ref 150–400)
RBC: 3.93 MIL/uL (ref 3.87–5.11)
RDW: 14.8 % (ref 11.5–15.5)
WBC: 13.6 10*3/uL — ABNORMAL HIGH (ref 4.0–10.5)
nRBC: 0 % (ref 0.0–0.2)

## 2023-02-22 MED ORDER — CARMEX CLASSIC LIP BALM EX OINT
TOPICAL_OINTMENT | CUTANEOUS | Status: DC | PRN
  Filled 2023-02-22: qty 10

## 2023-02-22 MED ORDER — POLYETHYLENE GLYCOL 3350 17 G PO PACK
17.0000 g | PACK | Freq: Two times a day (BID) | ORAL | Status: DC
  Administered 2023-02-22 – 2023-02-23 (×3): 17 g via ORAL
  Filled 2023-02-22 (×5): qty 1

## 2023-02-22 NOTE — [place] FL2 (Signed)
   MEDICAID FL2 LEVEL OF CARE FORM     IDENTIFICATION  Patient Name: Kathryn [name] Birthdate: [birth year] Sex: female Admission Date (Current Location): 02/18/2023  [facility] and [place] Number:  Producer, Television/film/video and Address:  [facility], [address], [place], [place] [address]      Provider Number: [id number]  Attending Physician Name and Address:  Fairy Frames, MD  Relative Name and Phone Number:  Ozell, Ferrera Daughter [phone]  [phone]    Current Level of Care: Hospital Recommended Level of Care: Skilled Nursing Facility Prior Approval Number:    Date Approved/Denied:   PASRR Number:    Discharge Plan: SNF    Current Diagnoses: Patient Active Problem List   Diagnosis Date Noted   Low back pain 02/20/2023   Protrusion of lumbar intervertebral disc 02/19/2023   Back pain 02/19/2023   History of CAD (coronary artery disease) 02/19/2023   Carotid stenosis 02/19/2023   Reactive airway disease 02/19/2023   GAD (generalized anxiety disorder) 02/19/2023   Sciatica    Rash 12/28/2022   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) 12/12/2022   Allergic rhinitis with postnasal drip 12/12/2022   Class 2 obesity due to excess calories with body mass index (BMI) of 35.0 to 35.9 in adult 12/12/2022   Acute bronchitis 12/12/2022   Immunization due 12/12/2022   COVID-19 09/05/2022   Change in bowel habit 04/21/2022   Diverticulosis of colon 04/21/2022   Dysphagia 04/21/2022   Family history of malignant neoplasm of digestive organs 04/21/2022   Class 3 severe obesity due to excess calories with serious comorbidity and body mass index (BMI) of 45.0 to 49.9 in adult [facility]) 04/21/2022   Myositis 04/21/2022   Periumbilical pain 04/21/2022   History of colonic polyps 04/21/2022   Bilateral carotid bruits 08/10/2021   Grief 06/05/2021   Prediabetes 06/05/2021    Spinal stenosis of lumbosacral region 06/05/2021   Coronary artery disease of native artery of native heart with stable angina pectoris (HCC) 06/07/2018   Anxiety 01/18/2018   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   CKD (chronic kidney disease) stage 2, GFR 60-89 ml/min 01/15/2018   Other abnormal glucose 01/15/2018   Neuralgia, 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   NSTEMI (non-ST elevated myocardial infarction) (HCC) 01/14/2018   Essential hypertension 01/14/2018   Hyperlipidemia 01/14/2018   Carotid stenosis, asymptomatic, bilateral 09/06/2012    Orientation RESPIRATION BLADDER Height & Weight     Self, Time, Situation, Place  Normal Continent, External catheter Weight: 193 lb 2 oz (87.6 kg) Height:  5' 5 (165.1 cm)  BEHAVIORAL SYMPTOMS/MOOD NEUROLOGICAL BOWEL NUTRITION STATUS      Continent Diet (see discharge summary)  AMBULATORY STATUS COMMUNICATION OF NEEDS Skin   Limited Assist Verbally Surgical wounds                       Personal Care Assistance Level of Assistance  Bathing, Feeding, Dressing, Total care Bathing Assistance: Limited assistance Feeding assistance: Limited assistance Dressing Assistance: Maximum assistance Total Care Assistance: Maximum assistance   Functional Limitations Info  Sight, Hearing, Speech Sight Info: Adequate Hearing Info: Adequate Speech Info: Adequate    SPECIAL CARE FACTORS FREQUENCY  PT (By licensed PT), OT (By licensed OT)     PT Frequency: 5x week OT Frequency: 5x week            Contractures Contractures Info: Not present    Additional Factors Info  Code Status, Allergies Code Status Info: full Allergies Info: Codeine, Ibuprofen           Current Medications (02/22/2023):  This is the current hospital active medication list Current Facility-Administered Medications  Medication Dose Route Frequency Provider Last Rate Last Admin   acetaminophen  (TYLENOL ) tablet 650 mg  650 mg Oral Q6H PRN Lanis Pupa, MD   650 mg at  02/21/23 1641   Or   acetaminophen  (TYLENOL ) suppository 650 mg  650 mg Rectal Q6H PRN Lanis Pupa, MD       ALPRAZolam  (XANAX ) tablet 0.25 mg  0.25 mg Oral QHS PRN Lanis Pupa, MD   0.25 mg at 02/21/23 2050   amLODipine  (NORVASC ) tablet 10 mg  10 mg Oral Daily Nundkumar, Neelesh, MD   10 mg at 02/22/23 0900   carvedilol  (COREG ) tablet 12.5 mg  12.5 mg Oral BID WC Nundkumar, Neelesh, MD   12.5 mg at 02/22/23 0759   ezetimibe  (ZETIA ) tablet 10 mg  10 mg Oral Daily Nundkumar, Neelesh, MD   10 mg at 02/22/23 0900   famotidine  (PEPCID ) tablet 20 mg  20 mg Oral Daily Nundkumar, Neelesh, MD   20 mg at 02/22/23 0900   fentaNYL  (SUBLIMAZE ) injection 12.5-25 mcg  12.5-25 mcg Intravenous Q2H PRN Lanis Pupa, MD   25 mcg at 02/21/23 2242   ferrous sulfate  tablet 325 mg  325 mg Oral Q breakfast Lanis Pupa, MD   325 mg at 02/22/23 0758   HYDROcodone -acetaminophen  (NORCO/VICODIN) 5-325 MG per tablet 1-2 tablet  1-2 tablet Oral Q4H PRN Lanis Pupa, MD   2 tablet at 02/22/23 0756   levalbuterol  (XOPENEX ) nebulizer solution 0.63 mg  0.63 mg Nebulization Q6H PRN Lanis Pupa, MD       loratadine  (CLARITIN ) tablet 10 mg  10 mg Oral Daily PRN Joseph, Preetha, MD   10 mg at 02/21/23 1644   losartan  (COZAAR ) tablet 25 mg  25 mg Oral Daily Nundkumar, Neelesh, MD   25 mg at 02/22/23 0900   menthol -cetylpyridinium (CEPACOL) lozenge 3 mg  1 lozenge Oral PRN Nundkumar, Neelesh, MD       Or   phenol (CHLORASEPTIC) mouth spray 1 spray  1 spray Mouth/Throat PRN Lanis Pupa, MD       ondansetron  (ZOFRAN ) tablet 4 mg  4 mg Oral Q6H PRN Lanis Pupa, MD       Or   ondansetron  (ZOFRAN ) injection 4 mg  4 mg Intravenous Q6H PRN Lanis Pupa, MD       pantoprazole  (PROTONIX ) EC tablet 40 mg  40 mg Oral QHS Paytes, Austin A, RPH   40 mg at 02/21/23 2050   polyethylene glycol (MIRALAX  / GLYCOLAX ) packet 17 g  17 g Oral BID Joseph, Preetha, MD       pravastatin  (PRAVACHOL )  tablet 40 mg  40 mg Oral QPM Nundkumar, Neelesh, MD   40 mg at 02/21/23 1800   pregabalin  (LYRICA ) capsule 75 mg  75 mg Oral TID Nundkumar, Neelesh, MD   75 mg at 02/22/23 0900   senna-docusate (Senokot-S) tablet 1 tablet  1 tablet Oral QHS PRN Nundkumar, Neelesh, MD   1 tablet at 02/19/23 2214   senna-docusate (Senokot-S) tablet 1 tablet  1 tablet Oral BID Nundkumar, Neelesh, MD   1 tablet at 02/22/23 0900   sertraline  (ZOLOFT ) tablet 25 mg  25 mg Oral Daily Nundkumar, Neelesh, MD   25 mg at 02/22/23 0900   tiZANidine  (ZANAFLEX ) tablet 4 mg  4 mg Oral Q8H PRN Nundkumar,  Gerldine, MD         Discharge Medications: Please see discharge summary for a list of discharge medications.  Relevant Imaging Results:  Relevant Lab Results:   Additional Information SSN: [id number]  [name], LCSW

## 2023-02-22 NOTE — Progress Notes (Addendum)
 PROGRESS NOTE    Kathryn [name]  FMW:[id number] DOB: November 30, 1940 DOA: 02/18/2023 PCP: Jarold Medici, MD  82/F with history of CAD, remote PCI in 2009, CKD 2, bilateral carotid artery stenosis, anxiety, hypertension, peripheral vascular disease, chronic back pain presented to the ED 1/5 with worsening low back pain for 3 weeks but acutely worse X 2 days with limited mobility.  In the ED hypertensive, labs largely unremarkable, MRI lumbar spine with large disc protrusion at L4-L5 with progressive severe canal stenosis -12/6 underwent L4-L5 laminectomy/microdiscectomy -Improving  Subjective: -Feels better, ambulated in the room yesterday, constipated  Assessment and Plan:  Worsening low back pain Large lumbar disc protrusion at L4-L5 with severe canal stenosis -Neurosurgery Dr. Lanis following, underwent for lumbar laminectomy and microdiscectomy 1/6 -Improving, pain control, add laxatives -Discharge planning, TOC consulted PT OT recommended SNF  Essential hypertension -Continue amlodipine , Coreg , losartan   Mild leukocytosis Likely reactive monitor  CKD 2 -Stable  History of CAD Carotid artery disease -Remote PCI, stable continue Coreg  and statin, aspirin  on hold  History of anxiety -Xanax  resumed  Reactive airway disease Continue Xopenex  as needed  Chronic microcytic anemia Stable  DVT prophylaxis: SCDs Code Status: Full code Family Communication: No family at bedside today Disposition Plan: Likely tomorrow  Antimicrobials:    Objective: Vitals:   02/21/23 0947 02/21/23 1937 02/22/23 0446 02/22/23 0746  BP: (!) 147/64 127/60 122/70 (!) 153/59  Pulse:  93 70 73  Resp:  17  18  Temp:  98.3 F (36.8 C) 98.1 F (36.7 C) 98.3 F (36.8 C)  TempSrc:  Oral Oral Oral  SpO2:  93% 98% 98%  Weight:   87.6 kg   Height:        Intake/Output Summary (Last 24 hours) at 02/22/2023 1059 Last data filed at 02/22/2023 9376 Gross per 24 hour  Intake 240 ml  Output 0  ml  Net 240 ml   Filed Weights   02/20/23 1347 02/22/23 0446  Weight: 88.5 kg 87.6 kg    Examination:  Gen: Awake, Alert, Oriented X 3,  HEENT: no JVD Lungs: Good air movement bilaterally, CTAB CVS: S1S2/RRR Abd: soft, Non tender, non distended, BS present Extremities: No edema Skin: no new rashes on exposed skin  Neuro: Moves all extremities    Data Reviewed:   CBC: Recent Labs  Lab 02/18/23 1841 02/20/23 1058 02/21/23 0608 02/22/23 0607  WBC 8.6 11.3* 13.6* 13.6*  HGB 12.0 12.3 11.2* 9.5*  HCT 37.5 39.9 35.6* 29.4*  MCV 73.8* 77.2* 75.1* 74.8*  PLT 418* 369 367 321   Basic Metabolic Panel: Recent Labs  Lab 02/18/23 1841 02/20/23 1058 02/21/23 0608 02/22/23 0607  NA 143 138 139 144  K 3.5 3.8 4.0 3.8  CL 106 104 104 104  CO2 27 23 25 25   GLUCOSE 79 101* 147* 109*  BUN 9 14 14  25*  CREATININE 0.83 1.11* 1.01* 1.18*  CALCIUM  8.5* 8.4* 8.5* 8.6*   GFR: Estimated Creatinine Clearance: 39.5 mL/min (A) (by C-G formula based on SCr of 1.18 mg/dL (H)). Liver Function Tests: Recent Labs  Lab 02/20/23 1058  AST 14*  ALT 9  ALKPHOS 60  BILITOT 0.6  PROT 6.1*  ALBUMIN  3.4*   No results for input(s): LIPASE, AMYLASE in the last 168 hours. No results for input(s): AMMONIA in the last 168 hours. Coagulation Profile: No results for input(s): INR, PROTIME in the last 168 hours. Cardiac Enzymes: No results for input(s): CKTOTAL, CKMB, CKMBINDEX, TROPONINI in the last 168  hours. BNP (last 3 results) No results for input(s): PROBNP in the last 8760 hours. HbA1C: No results for input(s): HGBA1C in the last 72 hours. CBG: No results for input(s): GLUCAP in the last 168 hours. Lipid Profile: No results for input(s): CHOL, HDL, LDLCALC, TRIG, CHOLHDL, LDLDIRECT in the last 72 hours. Thyroid  Function Tests: No results for input(s): TSH, T4TOTAL, FREET4, T3FREE, THYROIDAB in the last 72 hours. Anemia Panel: No  results for input(s): VITAMINB12, FOLATE, FERRITIN, TIBC, IRON, RETICCTPCT in the last 72 hours. Urine analysis:    Component Value Date/Time   COLORURINE STRAW (A) 01/14/2018 1500   APPEARANCEUR CLEAR 01/14/2018 1500   LABSPEC 1.005 01/14/2018 1500   PHURINE 6.0 01/14/2018 1500   GLUCOSEU NEGATIVE 01/14/2018 1500   HGBUR SMALL (A) 01/14/2018 1500   BILIRUBINUR negative 03/23/2022 1647   KETONESUR NEGATIVE 01/14/2018 1500   PROTEINUR Positive (A) 03/23/2022 1647   PROTEINUR NEGATIVE 01/14/2018 1500   UROBILINOGEN 0.2 03/23/2022 1647   UROBILINOGEN 0.2 03/08/2011 0931   NITRITE negative 03/23/2022 1647   NITRITE NEGATIVE 01/14/2018 1500   LEUKOCYTESUR Negative 03/23/2022 1647   Sepsis Labs: @LABRCNTIP (procalcitonin:4,lacticidven:4)  ) Recent Results (from the past 240 hours)  Surgical pcr screen     Status: None   Collection Time: 02/20/23  9:52 AM   Specimen: Nasal Mucosa; Nasal Swab  Result Value Ref Range Status   MRSA, PCR NEGATIVE NEGATIVE Final   Staphylococcus aureus NEGATIVE NEGATIVE Final    Comment: (NOTE) The Xpert SA Assay (FDA approved for NASAL specimens in patients 33 years of age and older), is one component of a comprehensive surveillance program. It is not intended to diagnose infection nor to guide or monitor treatment. Performed at [facility], [address] [place], [place] [address]      Radiology Studies: DG Lumbar Spine 2-3 Views Result Date: 02/20/2023 CLINICAL DATA:  Intraoperative localization for lumbar laminectomy and microdiskectomy at level L4-5. Elective surgery. EXAM: LUMBAR SPINE - 2-3 VIEW COMPARISON:  CT lumbar spine 02/18/2023 FINDINGS: Three cross-table lateral images of the lumbar spine are obtained intraoperatively for surgical localization purposes. Image labeled film 1 demonstrates a needle positioned over the spinous process of L4. The image labeled film 2 demonstrates a localization marker posterior to the body  of L4. The image labeled film 3 demonstrates retractors projecting over the L4 spinous process with a localization marker posterior to the L4-5 interspace. Mild anterior subluxation of L4-5 is noted as seen on prior CT. IMPRESSION: Intraoperative cross-table lateral views of the lumbar spine are obtained for surgical localization purposes. Electronically Signed   By: Elsie Gravely M.D.   On: 02/20/2023 20:05   Scheduled Meds:  amLODipine   10 mg Oral Daily   carvedilol   12.5 mg Oral BID WC   ezetimibe   10 mg Oral Daily   famotidine   20 mg Oral Daily   ferrous sulfate   325 mg Oral Q breakfast   losartan   25 mg Oral Daily   pantoprazole   40 mg Oral QHS   polyethylene glycol  17 g Oral BID   pravastatin   40 mg Oral QPM   pregabalin   75 mg Oral TID   senna-docusate  1 tablet Oral BID   sertraline   25 mg Oral Daily   Continuous Infusions:   LOS: 3 days    Time spent:    Sigurd Pac, MD [facility]   02/22/2023, 10:59 AM

## 2023-02-22 NOTE — Progress Notes (Signed)
 Mobility Specialist: Progress Note   02/22/23 1129  Mobility  Activity Ambulated with assistance in hallway  Level of Assistance Contact guard assist, steadying assist  Assistive Device Front wheel walker  Distance Ambulated (ft) 40 ft  Activity Response Tolerated well  Mobility Referral Yes  Mobility visit 1 Mobility  Mobility Specialist Start Time (ACUTE ONLY) 0908  Mobility Specialist Stop Time (ACUTE ONLY) 0920  Mobility Specialist Time Calculation (min) (ACUTE ONLY) 12 min    Pt was agreeable to mobility session - received in chair. C/o back soreness. Able to recall 1/3 back precautions - Ms educated the pt and provided precaution handout. CG throughout. Returned to room without fault. Left in chair with all needs met, call bell in reach.   Ileana Lute Mobility Specialist Please contact via SecureChat or Rehab office at [phone]

## 2023-02-22 NOTE — TOC Initial Note (Addendum)
 Transition of Care [facility]) - Initial/Assessment Note    Patient Details  Name: Kathryn [name] MRN: [id number] Date of Birth: Aug 10, 1940  Transition of Care Kathryn [name] CM/SW Contact:    [name], LCSW Phone Number: 02/22/2023, 11:59 AM  Clinical Narrative:   CSW met with pt and granddaughter.  Daughter Geni on speakerphone. Discussed PT recommendation for SNF, they are agreeable.  Medicare choice document provided, permission given to send out referral in hub.  Permission given to speak with daughters Geni and Havis.  Pt from home alone, no current services.  Referral sent out in hub for SNF.  Additional info required for passr.    1450: Bed offers provided to daughter Geni over the phone.  She will review.           1530: TC Erica.  She would like to accept offer at [facility].  CSW confirmed with [facility].  Auth request submitted in [place].         Expected Discharge Plan: Skilled Nursing Facility Barriers to Discharge: Continued Medical Work up, SNF Pending bed offer   Patient Goals and CMS Choice Patient states their goals for this hospitalization and ongoing recovery are:: back to normal CMS Medicare.gov Compare Post Acute Care list provided to:: Patient Choice offered to / list presented to : Patient, Adult Children (daughter Geni)      Expected Discharge Plan and Services In-house Referral: Clinical Social Work   Post Acute Care Choice: Skilled Nursing Facility Living arrangements for the past 2 months: Single Family Home                                      Prior Living Arrangements/Services Living arrangements for the past 2 months: Single Family Home Lives with:: Self Patient language and need for interpreter reviewed:: Yes Do you feel safe going back to the place where you live?: Yes      Need for Family Participation in Patient Care: Yes (Comment) Care giver support system in place?: Yes (comment) Current home services: Other (comment)  (none) Criminal Activity/Legal Involvement Pertinent to Current Situation/Hospitalization: No - Comment as needed  Activities of Daily Living   ADL Screening (condition at time of admission) Independently performs ADLs?: No Does the patient have a NEW difficulty with bathing/dressing/toileting/self-feeding that is expected to last >3 days?: Yes (Initiates electronic notice to provider for possible OT consult) Does the patient have a NEW difficulty with getting in/out of bed, walking, or climbing stairs that is expected to last >3 days?: Yes (Initiates electronic notice to provider for possible PT consult) Does the patient have a NEW difficulty with communication that is expected to last >3 days?: No Is the patient deaf or have difficulty hearing?: No Does the patient have difficulty seeing, even when wearing glasses/contacts?: No Does the patient have difficulty concentrating, remembering, or making decisions?: No  Permission Sought/Granted Permission sought to share information with : Family Supports Permission granted to share information with : Yes, Verbal Permission Granted  Share Information with NAME: daughters Geni Raya  Permission granted to share info w AGENCY: SNF        Emotional Assessment Appearance:: Appears stated age Attitude/Demeanor/Rapport: Engaged Affect (typically observed): Appropriate, Pleasant Orientation: : Oriented to Self, Oriented to Place, Oriented to  Time, Oriented to Situation      Admission diagnosis:  Low back pain [M54.50] Weakness [R53.1] Protrusion of lumbar intervertebral disc [M51.26] Acute  right-sided low back pain with right-sided sciatica [M54.41] Acute right-sided low back pain, unspecified whether sciatica present [M54.50] Patient Active Problem List   Diagnosis Date Noted   Low back pain 02/20/2023   Protrusion of lumbar intervertebral disc 02/19/2023   Back pain 02/19/2023   History of CAD (coronary artery disease) 02/19/2023    Carotid stenosis 02/19/2023   Reactive airway disease 02/19/2023   GAD (generalized anxiety disorder) 02/19/2023   Sciatica    Rash 12/28/2022   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) 12/12/2022   Allergic rhinitis with postnasal drip 12/12/2022   Class 2 obesity due to excess calories with body mass index (BMI) of 35.0 to 35.9 in adult 12/12/2022   Acute bronchitis 12/12/2022   Immunization due 12/12/2022   COVID-19 09/05/2022   Change in bowel habit 04/21/2022   Diverticulosis of colon 04/21/2022   Dysphagia 04/21/2022   Family history of malignant neoplasm of digestive organs 04/21/2022   Class 3 severe obesity due to excess calories with serious comorbidity and body mass index (BMI) of 45.0 to 49.9 in adult [facility]) 04/21/2022   Myositis 04/21/2022   Periumbilical pain 04/21/2022   History of colonic polyps 04/21/2022   Bilateral carotid bruits 08/10/2021   Grief 06/05/2021   Prediabetes 06/05/2021   Spinal stenosis of lumbosacral region 06/05/2021   Coronary artery disease of native artery of native heart with stable angina pectoris (HCC) 06/07/2018   Anxiety 01/18/2018   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   CKD (chronic kidney disease) stage 2, GFR 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 [place] [address] Phone: [phone] Fax: [phone]  [id number] [place], [place] - [address] [place] [address] Phone: [phone] Fax: [phone]     Social Drivers of Health  (SDOH) Social History: SDOH Screenings   Food Insecurity: No Food Insecurity (02/19/2023)  Housing: Low Risk  (02/19/2023)  Transportation Needs: No Transportation Needs (02/19/2023)  Utilities: Not At Risk (02/19/2023)  Alcohol Screen: Low Risk  (05/24/2018)  Depression (PHQ2-9): Low Risk  (12/12/2022)  Financial Resource Strain: Low Risk  (06/09/2022)  Physical Activity: Inactive (06/09/2022)  Social Connections: Moderately Integrated (02/19/2023)  Stress: No Stress Concern Present (06/09/2022)  Tobacco Use: Medium Risk (02/20/2023)   SDOH Interventions:     Readmission Risk Interventions     No data to display

## 2023-02-22 NOTE — Plan of Care (Signed)
  Problem: Education: Goal: Knowledge of General Education information will improve Description: Including pain rating scale, medication(s)/side effects and non-pharmacologic comfort measures Outcome: Progressing   Problem: Health Behavior/Discharge Planning: Goal: Ability to manage health-related needs will improve Outcome: Progressing   Problem: Clinical Measurements: Goal: Ability to maintain clinical measurements within normal limits will improve Outcome: Progressing   Problem: Activity: Goal: Risk for activity intolerance will decrease Outcome: Progressing   Problem: Nutrition: Goal: Adequate nutrition will be maintained Outcome: Progressing   Problem: Pain Management: Goal: General experience of comfort will improve Outcome: Progressing   Problem: Safety: Goal: Ability to remain free from injury will improve Outcome: Progressing   Problem: Skin Integrity: Goal: Risk for impaired skin integrity will decrease Outcome: Progressing

## 2023-02-22 NOTE — Care Management Important Message (Signed)
 Important Message  Patient Details  Name: Kathryn [name] MRN: [id number] Date of Birth: 06/05/1940   Important Message Given:    Spoke with Patient who advised me to send a copy Im to her home address.     Clarene Curran 02/22/2023, 2:24 PM

## 2023-02-22 NOTE — Plan of Care (Signed)
  Problem: Education: Goal: Knowledge of General Education information will improve Description: Including pain rating scale, medication(s)/side effects and non-pharmacologic comfort measures Outcome: Progressing   Problem: Activity: Goal: Risk for activity intolerance will decrease Outcome: Progressing   Problem: Coping: Goal: Level of anxiety will decrease Outcome: Progressing   Problem: Elimination: Goal: Will not experience complications related to bowel motility Outcome: Progressing   Problem: Pain Management: Goal: General experience of comfort will improve Outcome: Progressing

## 2023-02-22 NOTE — Progress Notes (Signed)
 RE:  Kathryn [name]     Date of Birth:  03/14/1940     Date:   02/22/23       To Whom It May Concern:  Please be advised that the above-named patient will require a short-term nursing home stay - anticipated 30 days or less for rehabilitation and strengthening.  The plan is for return home.                 MD signature                Date

## 2023-02-22 NOTE — Progress Notes (Signed)
  NEUROSURGERY PROGRESS NOTE   No issues overnight. Some back pain this am, appears comfortable sitting in bedside chair.  EXAM:  BP (!) 153/59 (BP Location: Left Arm)   Pulse 73   Temp 98.3 F (36.8 C) (Oral)   Resp 18   Ht 5' 5 (1.651 m)   Wt 87.6 kg   SpO2 98%   BMI 32.14 kg/m   Awake, alert, oriented  Speech fluent, appropriate  CN grossly intact  5/5 BUE/BLE   IMPRESSION:  83 y.o. female POD#2 L4-5 laminectomy/microdiscectomy with significant improvement in preop back/leg pain.  PLAN: - Stable for d/c from neurosurgical standpoint. Suspect she can go home with HHPT/OT.   Gerldine Maizes, MD [facility]

## 2023-02-22 NOTE — Progress Notes (Signed)
 Physical Therapy Treatment Patient Details Name: Kathryn [name] MRN: [id number] DOB: May 13, 1940 Today's Date: 02/22/2023   History of Present Illness Pt is a 83 y/o female presenting on 1/4 with back pain. MRI lumbar spine with large disc protrusion at L4-5 with progressive severe canal stenosis.  1/6 S/P decompression with laminectomy/microdiscectomy L4-5. PMH includes: anxiety, arthritis, CAD, dysphagia, PVD, sciatica, MI.    PT Comments  Pt in recliner with family present upon arrival and agreeable to PT session. Worked on gait training and stair negotiation in today's session. Pt was able to recall 2/3 spinal precautions, however, continues to needs cues during mobility for adherence. Pt is able to ambulate a short distance with CGA and RW before needing a seated rest break. Pt was able to ascend/descend 3 steps with CGA and cues for sequencing. Pt was fatigued after returning to room and requested to return to supine. Pt required MinA to return to supine with cues for log roll technique. Pt is progressing well towards goals. Pt would continue to benefit from <3 hrs rehab to return to previous level of independence. Pt does not currently have 24/7 assistance at home and requires frequent cues for precautions and assist for steadying and bed mobility. Acute PT to follow.      If plan is discharge home, recommend the following: A little help with walking and/or transfers;A little help with bathing/dressing/bathroom;Help with stairs or ramp for entrance;Direct supervision/assist for medications management;Assistance with cooking/housework;Assist for transportation   Can travel by private vehicle     Yes  Equipment Recommendations  Rolling walker (2 wheels)       Precautions / Restrictions Precautions Precautions: Fall;Back Precaution Booklet Issued: Yes (comment) Required Braces or Orthoses:  (no brace required) Restrictions Weight Bearing Restrictions Per Provider Order: No      Mobility  Bed Mobility Overal bed mobility: Needs Assistance Bed Mobility: Rolling, Sit to Sidelying Rolling: Contact guard assist     Sit to sidelying: Min assist General bed mobility comments: MinA for sit/sidelying for LE management. Pt needed cues for log roll technique    Transfers Overall transfer level: Needs assistance Equipment used: Rolling walker (2 wheels) Transfers: Sit to/from Stand Sit to Stand: Contact guard assist    General transfer comment: CGA for safety, cues for hand placement as pt tends to pull on RW    Ambulation/Gait Ambulation/Gait assistance: Contact guard assist Gait Distance (Feet): 60 Feet (x2) Assistive device: Rolling walker (2 wheels) Gait Pattern/deviations: Step-through pattern, Decreased stride length, Shuffle Gait velocity: decr    General Gait Details: CGA for safety, slow and steady pace with no LOB. Cues to relax shoulders as pt tends to hold tension   Stairs Stairs: Yes Stairs assistance: Contact guard assist Stair Management: One rail Right, Step to pattern, Sideways Number of Stairs: 3 General stair comments: CGA for safety and cues for sequencing. Pt used sideways step to pattern with B UE on rail     Balance Overall balance assessment: Needs assistance Sitting-balance support: No upper extremity supported, Feet supported Sitting balance-Leahy Scale: Good     Standing balance support: Bilateral upper extremity supported Standing balance-Leahy Scale: Poor Standing balance comment: UE support for balance     Cognition Arousal: Alert Behavior During Therapy: WFL for tasks assessed/performed Overall Cognitive Status: Within Functional Limits for tasks assessed    General Comments: recalled 2/3 spinal precautions           General Comments General comments (skin integrity, edema, etc.): VSS on RA,  family in room and supportive throughout session      Pertinent Vitals/Pain Pain Assessment Pain Assessment:  Faces Faces Pain Scale: Hurts a little bit Pain Location: back Pain Descriptors / Indicators: Discomfort, Grimacing, Operative site guarding Pain Intervention(s): Limited activity within patient's tolerance, Monitored during session, Repositioned     PT Goals (current goals can now be found in the care plan section) Acute Rehab PT Goals PT Goal Formulation: With patient/family Time For Goal Achievement: 03/07/23 Potential to Achieve Goals: Good Progress towards PT goals: Progressing toward goals    Frequency    Min 1X/week       AM-PAC PT 6 Clicks Mobility   Outcome Measure  Help needed turning from your back to your side while in a flat bed without using bedrails?: A Little Help needed moving from lying on your back to sitting on the side of a flat bed without using bedrails?: A Little Help needed moving to and from a bed to a chair (including a wheelchair)?: A Little Help needed standing up from a chair using your arms (e.g., wheelchair or bedside chair)?: A Little Help needed to walk in hospital room?: A Little Help needed climbing 3-5 steps with a railing? : A Little 6 Click Score: 18    End of Session Equipment Utilized During Treatment: Gait belt Activity Tolerance: Patient limited by fatigue Patient left: in bed;with call bell/phone within reach;with bed alarm set;with family/visitor present Nurse Communication: Mobility status PT Visit Diagnosis: Other abnormalities of gait and mobility (R26.89);Difficulty in walking, not elsewhere classified (R26.2)     Time: 8674-8655 PT Time Calculation (min) (ACUTE ONLY): 19 min  Charges:    $Therapeutic Activity: 8-22 mins PT General Charges $$ ACUTE PT VISIT: 1 Visit                     Kate ORN, PT, DPT Secure Chat Preferred  Rehab Office [phone]   [name] [name] 02/22/2023, 1:50 PM

## 2023-02-23 DIAGNOSIS — M5126 Other intervertebral disc displacement, lumbar region: Secondary | ICD-10-CM | POA: Diagnosis not present

## 2023-02-23 LAB — CBC
HCT: 28.1 % — ABNORMAL LOW (ref 36.0–46.0)
Hemoglobin: 9 g/dL — ABNORMAL LOW (ref 12.0–15.0)
MCH: 24.1 pg — ABNORMAL LOW (ref 26.0–34.0)
MCHC: 32 g/dL (ref 30.0–36.0)
MCV: 75.3 fL — ABNORMAL LOW (ref 80.0–100.0)
Platelets: 301 10*3/uL (ref 150–400)
RBC: 3.73 MIL/uL — ABNORMAL LOW (ref 3.87–5.11)
RDW: 14.8 % (ref 11.5–15.5)
WBC: 11.5 10*3/uL — ABNORMAL HIGH (ref 4.0–10.5)
nRBC: 0 % (ref 0.0–0.2)

## 2023-02-23 MED ORDER — HYDROCODONE-ACETAMINOPHEN 5-325 MG PO TABS: 1.0000 | ORAL_TABLET | Freq: Four times a day (QID) | ORAL | 0 refills | Status: DC | PRN

## 2023-02-23 MED ORDER — POLYETHYLENE GLYCOL 3350 17 G PO PACK: 17.0000 g | PACK | Freq: Every day | ORAL | Status: AC

## 2023-02-23 NOTE — Plan of Care (Signed)

## 2023-02-23 NOTE — TOC Progression Note (Addendum)
 Transition of Care [facility]) - Progression Note    Patient Details  Name: Kathryn [name] MRN: [id number] Date of Birth: [birth year]  [facility]) CM/SW Contact  [name], LCSW Phone Number: 02/23/2023, 8:30 AM  Clinical Narrative:   PASSR docs uploaded to [place] [facility].  SNF auth request remains pending.    1300: SNF auth still pending.  1430: PASSR received: [id number].  Auth remains pending.   Expected Discharge Plan: Skilled Nursing Facility Barriers to Discharge: Continued Medical Work up, SNF Pending bed offer  Expected Discharge Plan and Services In-house Referral: Clinical Social Work   Post Acute Care Choice: Skilled Nursing Facility Living arrangements for the past 2 months: Single Family Home                                       Social Determinants of Health (SDOH) Interventions SDOH Screenings   Food Insecurity: No Food Insecurity (02/19/2023)  Housing: Low Risk  (02/19/2023)  Transportation Needs: No Transportation Needs (02/19/2023)  Utilities: Not At Risk (02/19/2023)  Alcohol Screen: Low Risk  (05/24/2018)  Depression (PHQ2-9): Low Risk  (12/12/2022)  Financial Resource Strain: Low Risk  (06/09/2022)  Physical Activity: Inactive (06/09/2022)  Social Connections: Moderately Integrated (02/19/2023)  Stress: No Stress Concern Present (06/09/2022)  Tobacco Use: Medium Risk (02/20/2023)    Readmission Risk Interventions     No data to display

## 2023-02-23 NOTE — Plan of Care (Signed)

## 2023-02-23 NOTE — Discharge Summary (Addendum)
 Physician Discharge Summary  GWYNNETH FABIO [name]:[id number] DOB: [birth year] DOA: 02/18/2023  PCP: Kathryn Medici, MD  Admit date: 02/18/2023 Discharge date: 02/24/2023  Discharge summary per Dr Fairy.  Addendum: Patient has a stage 2 pressure wound linear under the right breast, with no purulence or erythema, continue local wound care and close follow up.  Hydrocodone  changed to tramadol  due to nausea.   Time spent: 45 minutes  Recommendations for Outpatient Follow-up:  SNF for short-term rehab Neurosurgery Dr. Lanis in 10 days  Discharge Diagnoses:  Principal Problem:   Protrusion of lumbar intervertebral disc Lumbar stenosis   Back pain   Essential hypertension   CKD (chronic kidney disease) stage 2, GFR 60-89 ml/min   Sciatica   History of CAD (coronary artery disease)   Carotid stenosis   Reactive airway disease   GAD (generalized anxiety disorder)   Low back pain   Discharge Condition: Improved  Diet recommendation: Low-sodium  Filed Weights   02/20/23 1347 02/22/23 0446  Weight: 88.5 kg 87.6 kg    History of present illness:  83/F with history of CAD, remote PCI in 2009, CKD 2, bilateral carotid artery stenosis, anxiety, hypertension, peripheral vascular disease, chronic back pain presented to the ED 1/5 with worsening low back pain for 3 weeks but acutely worse X 2 days with limited mobility.  In the ED hypertensive, labs largely unremarkable, MRI lumbar spine with large disc protrusion at L4-L5 with progressive severe canal stenosis -12/6 underwent L4-L5 laminectomy/microdiscectomy  Hospital Course:   Worsening low back pain Large lumbar disc protrusion at L4-L5, lumbar stenosis -Neurosurgery Dr. Lanis following, underwent for lumbar laminectomy and microdiscectomy 1/6 -Improving, walking now, pain control, laxatives -She will be discharged to SNF for short-term rehab -Follow-up with neurosurgery Dr. Lanis in 10 days   Essential  hypertension -Continue amlodipine , Coreg , losartan    Mild leukocytosis Likely reactive monitor   CKD 2 -Stable   History of CAD Carotid artery disease -Remote PCI, stable continue Coreg  and statin, aspirin  on hold   History of anxiety -Xanax  resumed   Reactive airway disease Continue Xopenex  as needed   Chronic microcytic anemia Stable    Discharge Exam: Vitals:   02/23/23 0502 02/23/23 0733  BP: (!) 130/51 117/67  Pulse: 67 66  Resp: 16   Temp: 97.8 F (36.6 C) 98.1 F (36.7 C)  SpO2: 95% 98%   Gen: Awake, Alert, Oriented X 3,  HEENT: no JVD Lungs: Good air movement bilaterally, CTAB CVS: S1S2/RRR Abd: soft, Non tender, non distended, BS present Extremities: No edema Skin: no new rashes on exposed skin  Neuro: Moves all extremities  Discharge Instructions   Discharge Instructions     Diet - low sodium heart healthy   Complete by: As directed    Incentive spirometry RT   Complete by: As directed    Increase activity slowly   Complete by: As directed       Allergies as of 02/24/2023       Reactions   Codeine Hypertension   Ibuprofen Other (See Comments)   Makes sick on stomach        Medication List     STOP taking these medications    furosemide  20 MG tablet Commonly known as: LASIX        TAKE these medications    acetaminophen  325 MG tablet Commonly known as: TYLENOL  Take 325 mg by mouth every 6 (six) hours as needed for mild pain.   ALPRAZolam  0.25 MG tablet Commonly known as:  XANAX  Take 1 tablet (0.25 mg total) by mouth every evening.   amLODipine  10 MG tablet Commonly known as: NORVASC  TAKE ONE TABLET BY MOUTH ONCE DAILY.   Aspirin  81 MG Caps   CALCIUM  PO Take 1 tablet by mouth daily. 600 mg   carvedilol  12.5 MG tablet Commonly known as: COREG  TAKE (1) TABLET BY MOUTH TWICE DAILY.   cetirizine 10 MG tablet Commonly known as: ZYRTEC Take 10 mg by mouth at bedtime.   diclofenac  Sodium 1 % Gel Commonly known as:  Voltaren  Apply 4 g topically 4 (four) times daily.   ezetimibe  10 MG tablet Commonly known as: ZETIA  TAKE 1 TABLET BY MOUTH ONCE A DAY.   famotidine  20 MG tablet Commonly known as: PEPCID  TAKE ONE TABLET BY MOUTH ONCE DAILY.   fluticasone  50 MCG/ACT nasal spray Commonly known as: FLONASE  Place 2 sprays into both nostrils daily.   losartan  25 MG tablet Commonly known as: Cozaar  Take 1 tablet (25 mg total) by mouth daily.   multivitamin with minerals Tabs tablet Take 1 tablet by mouth daily.   nitroGLYCERIN  0.4 MG SL tablet Commonly known as: NITROSTAT  Place 1 tablet (0.4 mg total) under the tongue every 5 (five) minutes x 3 doses as needed for chest pain.   polyethylene glycol 17 g packet Commonly known as: MIRALAX  / GLYCOLAX  Take 17 g by mouth daily.   pravastatin  40 MG tablet Commonly known as: PRAVACHOL  Take 1 tablet (40 mg total) by mouth every evening.   pregabalin  75 MG capsule Commonly known as: LYRICA  TAKE 1 CAPSULE BY MOUTH THREE TIMES A DAY.   PREVAGEN PO Take 1 tablet by mouth daily.   PROBIOTIC DAILY PO Take 1 tablet by mouth daily at 12 noon.   Prolia  60 MG/ML Sosy injection Generic drug: denosumab  Inject 60 mg into the skin every 6 (six) months. 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[address], [address], [place] [address].Appt on 08/03/22   sertraline  25 MG tablet Commonly known as: ZOLOFT  TAKE 1 TABLET BY MOUTH DAILY.   tiZANidine  4 MG tablet Commonly known as: ZANAFLEX  TAKE 1 TABLET BY MOUTH ONCE AT BEDTIME AS NEEDED FOR MUSCLE SPASMS.   traMADol  50 MG tablet Commonly known as: ULTRAM  Take 1 tablet (50 mg total) by mouth every 6 (six) hours as needed for severe pain (pain score 7-10). What changed:  when to take this reasons to take this   triamcinolone  cream 0.1 % Commonly known as: KENALOG  Apply 1 Application topically 2 (two) times daily. What changed:  when to take this reasons to take this       Allergies  Allergen Reactions   Codeine  Hypertension   Ibuprofen Other (See Comments)    Makes sick on stomach    Follow-up Information     Lanis Pupa, MD Follow up in 2 week(s).   Specialty: Neurosurgery Contact information: [address] [place] [address] [phone]                  The results of significant diagnostics from this hospitalization (including imaging, microbiology, ancillary and laboratory) are listed below for reference.    Significant Diagnostic Studies: DG Lumbar Spine 2-3 Views Result Date: 02/20/2023 CLINICAL DATA:  Intraoperative localization for lumbar laminectomy and microdiskectomy at level L4-5. Elective surgery. EXAM: LUMBAR SPINE - 2-3 VIEW COMPARISON:  CT lumbar spine 02/18/2023 FINDINGS: Three cross-table lateral images of the lumbar spine are obtained intraoperatively for surgical localization purposes. Image labeled film 1 demonstrates  a needle positioned over the spinous process of L4. The image labeled film 2 demonstrates a localization marker posterior to the body of L4. The image labeled film 3 demonstrates retractors projecting over the L4 spinous process with a localization marker posterior to the L4-5 interspace. Mild anterior subluxation of L4-5 is noted as seen on prior CT. IMPRESSION: Intraoperative cross-table lateral views of the lumbar spine are obtained for surgical localization purposes. Electronically Signed   By: Elsie Gravely M.D.   On: 02/20/2023 20:05   MR LUMBAR SPINE WO CONTRAST Result Date: 02/19/2023 CLINICAL DATA:  Low back pain, cauda equina syndrome suspected EXAM: MRI LUMBAR SPINE WITHOUT CONTRAST TECHNIQUE: Multiplanar, multisequence MR imaging of the lumbar spine was performed. No intravenous contrast was administered. COMPARISON:  None Available. FINDINGS: Segmentation: Standard segmentation is assumed. The inferior-most fully formed intervertebral disc labeled L5-S1. Alignment: Grade 1 anterolisthesis of L4 on L5. Vertebrae:  Degenerative/discogenic endplate signal changes at L4-L5 and T12-L1. No specific evidence of acute fracture or discitis/osteomyelitis. No suspicious bone lesions. Conus medullaris and cauda equina: Conus extends to the L1-L2 level. Conus appears normal. Paraspinal and other soft tissues: Atrophic right kidney, smaller than on the 2021 prior. Disc levels: T12-L1: Mild disc bulging with mild left foraminal stenosis, similar. No significant canal or right foraminal stenosis. L1-L2: Disc bulging and right greater than left facet arthropathy. Similar mild right greater than left foraminal stenosis. Mild canal stenosis. L2-L3: Mild disc bulging. Ligamentum flavum thickening facet arthropathy. Similar moderate right and mild left foraminal stenosis. Similar mild canal stenosis L3-L4: Disc bulging with bulky ligamentum flavum thickening. Bilateral facet arthropathy. Resulting mild to moderate right foraminal stenosis with progressive mild to moderate canal stenosis. Mild left foraminal stenosis. L4-L5: Grade 1 anterolisthesis. Uncovering the disc with superimposed superiorly directed large disc protrusion. Bulky ligamentum flavum thickening facet arthropathy. Resulting severe canal stenosis, which has progressed. Mild bilateral foraminal stenosis. L5-S1: Mild disc bulging. Moderate bilateral facet arthropathy. Similar moderate to severe left foraminal stenosis. Patent canal and right foramen. IMPRESSION: 1. At L4-L5, new large disc protrusion with progressive severe canal stenosis. 2. Milder multilevel degenerative changes are detailed above. 3. Atrophic right kidney, smaller than on the 2021 prior. Electronically Signed   By: Gilmore GORMAN Molt M.D.   On: 02/19/2023 03:38   CT Lumbar Spine Wo Contrast Result Date: 02/18/2023 CLINICAL DATA:  Low back pain, increased fracture risk EXAM: CT LUMBAR SPINE WITHOUT CONTRAST TECHNIQUE: Multidetector CT imaging of the lumbar spine was performed without intravenous contrast  administration. Multiplanar CT image reconstructions were also generated. RADIATION DOSE REDUCTION: This exam was performed according to the departmental dose-optimization program which includes automated exposure control, adjustment of the mA and/or kV according to patient size and/or use of iterative reconstruction technique. COMPARISON:  None Available. FINDINGS: Segmentation: 5 lumbar type vertebrae. Alignment: Trace anterolisthesis of L3 on L4. Grade 1 anterolisthesis of L4 on L5. Vertebrae: No acute fracture or focal pathologic process. Paraspinal and other soft tissues: Diverticulosis without evidence of diverticulitis. Aortic atherosclerotic calcifications. Status post cholecystectomy. The right kidney is small in size. Left kidney is without evidence of hydronephrosis or nephrolithiasis. Disc levels: Moderate spinal canal narrowing at C3-C4 secondary to a combination of a disc bulge and ligamentum flavum hypertrophy. There is likely a relatively large intraspinal synovial cyst at the L4-L5 level on the left. There is likely severe spinal canal stenosis at this level. Recommend further evaluation with a lumbar spine MRI. IMPRESSION: 1. No acute fracture or traumatic listhesis. 2. There  is likely a relatively large intraspinal synovial cyst at the L4-L5 level on the left with associated severe spinal canal stenosis at this level. Recommend further evaluation with a lumbar spine MRI. Aortic Atherosclerosis (ICD10-I70.0). Electronically Signed   By: Lyndall Gore M.D.   On: 02/18/2023 19:15    Microbiology: Recent Results (from the past 240 hours)  Surgical pcr screen     Status: None   Collection Time: 02/20/23  9:52 AM   Specimen: Nasal Mucosa; Nasal Swab  Result Value Ref Range Status   MRSA, PCR NEGATIVE NEGATIVE Final   Staphylococcus aureus NEGATIVE NEGATIVE Final    Comment: (NOTE) The Xpert SA Assay (FDA approved for NASAL specimens in patients 54 years of age and older), is one component  of a comprehensive surveillance program. It is not intended to diagnose infection nor to guide or monitor treatment. Performed at [facility], [address] [place], [place] [address]      Labs: Basic Metabolic Panel: Recent Labs  Lab 02/18/23 1841 02/20/23 1058 02/21/23 0608 02/22/23 0607  NA 143 138 139 144  K 3.5 3.8 4.0 3.8  CL 106 104 104 104  CO2 27 23 25 25   GLUCOSE 79 101* 147* 109*  BUN 9 14 14  25*  CREATININE 0.83 1.11* 1.01* 1.18*  CALCIUM  8.5* 8.4* 8.5* 8.6*   Liver Function Tests: Recent Labs  Lab 02/20/23 1058  AST 14*  ALT 9  ALKPHOS 60  BILITOT 0.6  PROT 6.1*  ALBUMIN  3.4*   No results for input(s): LIPASE, AMYLASE in the last 168 hours. No results for input(s): AMMONIA in the last 168 hours. CBC: Recent Labs  Lab 02/18/23 1841 02/20/23 1058 02/21/23 0608 02/22/23 0607 02/23/23 0504  WBC 8.6 11.3* 13.6* 13.6* 11.5*  HGB 12.0 12.3 11.2* 9.5* 9.0*  HCT 37.5 39.9 35.6* 29.4* 28.1*  MCV 73.8* 77.2* 75.1* 74.8* 75.3*  PLT 418* 369 367 321 301   Cardiac Enzymes: No results for input(s): CKTOTAL, CKMB, CKMBINDEX, TROPONINI in the last 168 hours. BNP: BNP (last 3 results) No results for input(s): BNP in the last 8760 hours.  ProBNP (last 3 results) No results for input(s): PROBNP in the last 8760 hours.  CBG: No results for input(s): GLUCAP in the last 168 hours.     Signed:  Sigurd Pac MD.  [facility] 02/23/2023, 9:37 AM

## 2023-02-23 NOTE — Progress Notes (Signed)
 Physical Therapy Treatment Patient Details Name: Kathryn [name] MRN: [id number] DOB: 09-Oct-1940 Today's Date: 02/23/2023   History of Present Illness Pt is a 83 y/o female presenting on 1/4 with back pain. MRI lumbar spine with large disc protrusion at L4-5 with progressive severe canal stenosis.  1/6 S/P decompression with laminectomy/microdiscectomy L4-5. PMH includes: anxiety, arthritis, CAD, dysphagia, PVD, sciatica, MI.    PT Comments  Pt in bed upon arrival with family present and agreeable to PT session. Worked on gait training and LE strength in today's session. Pt continues to need cues to adhere to spinal precautions for bed mobility. Pt needs MinA/ModA for bed mobility in today's session secondary to fatigue and nausea. Pt was able to perform two sets of ambulating ~70 ft with CGA. After second set, pt reported an increase in nausea and that her head felt swimmy. BP in supine was 129/56 with decrease of symptoms with rest, RN notified. Pt is progressing towards goals. Acute PT to follow.      If plan is discharge home, recommend the following: A little help with walking and/or transfers;A little help with bathing/dressing/bathroom;Help with stairs or ramp for entrance;Direct supervision/assist for medications management;Assistance with cooking/housework;Assist for transportation   Can travel by private vehicle     Yes  Equipment Recommendations  Rolling walker (2 wheels)       Precautions / Restrictions Precautions Precautions: Fall;Back Precaution Booklet Issued: Yes (comment) Required Braces or Orthoses:  (no brace required) Restrictions Weight Bearing Restrictions Per Provider Order: No     Mobility  Bed Mobility Overal bed mobility: Needs Assistance Bed Mobility: Rolling, Sit to Sidelying Rolling: Contact guard assist Sidelying to sit: Min assist     Sit to sidelying: Mod assist General bed mobility comments: MinA for sit/sidelying for trunk elevation. ModA  for LE management from sit to sidelying. Pt needed cues for log roll technique    Transfers Overall transfer level: Needs assistance Equipment used: Rolling walker (2 wheels) Transfers: Sit to/from Stand Sit to Stand: Contact guard assist      General transfer comment: CGA for safety, cues for hand placement as pt tends to pull on RW    Ambulation/Gait Ambulation/Gait assistance: Contact guard assist Gait Distance (Feet): 70 Feet (x2) Assistive device: Rolling walker (2 wheels) Gait Pattern/deviations: Step-through pattern, Decreased stride length, Shuffle Gait velocity: decr     General Gait Details: CGA for safety. Cues to relax shoulders       Balance Overall balance assessment: Needs assistance Sitting-balance support: No upper extremity supported, Feet supported Sitting balance-Leahy Scale: Good     Standing balance support: Bilateral upper extremity supported Standing balance-Leahy Scale: Poor Standing balance comment: UE support for balance       Cognition Arousal: Alert Behavior During Therapy: WFL for tasks assessed/performed Overall Cognitive Status: Within Functional Limits for tasks assessed     Exercises General Exercises - Lower Extremity Long Arc Quad: AROM, Both, 10 reps, Seated    General Comments General comments (skin integrity, edema, etc.): Pt reported her head felt swimmy with nausea after second bout of ambulation. BP 129/56 in supine, RN notified      Pertinent Vitals/Pain Pain Assessment Pain Assessment: Faces Faces Pain Scale: Hurts a little bit Pain Location: back Pain Descriptors / Indicators: Discomfort, Grimacing Pain Intervention(s): Monitored during session, Limited activity within patient's tolerance, Repositioned     PT Goals (current goals can now be found in the care plan section) Acute Rehab PT Goals PT Goal Formulation: With  patient/family Time For Goal Achievement: 03/07/23 Potential to Achieve Goals:  Good Progress towards PT goals: Progressing toward goals    Frequency    Min 1X/week       AM-PAC PT 6 Clicks Mobility   Outcome Measure  Help needed turning from your back to your side while in a flat bed without using bedrails?: A Little Help needed moving from lying on your back to sitting on the side of a flat bed without using bedrails?: A Little Help needed moving to and from a bed to a chair (including a wheelchair)?: A Little Help needed standing up from a chair using your arms (e.g., wheelchair or bedside chair)?: A Little Help needed to walk in hospital room?: A Little Help needed climbing 3-5 steps with a railing? : A Little 6 Click Score: 18    End of Session Equipment Utilized During Treatment: Gait belt Activity Tolerance: Other (comment) (limited by nausea) Patient left: in bed;with call bell/phone within reach;with family/visitor present Nurse Communication: Mobility status (symptoms) PT Visit Diagnosis: Other abnormalities of gait and mobility (R26.89);Difficulty in walking, not elsewhere classified (R26.2)     Time: 8553-8488 PT Time Calculation (min) (ACUTE ONLY): 25 min  Charges:    $Gait Training: 8-22 mins $Therapeutic Exercise: 8-22 mins PT General Charges $$ ACUTE PT VISIT: 1 Visit                    Kate ORN, PT, DPT Secure Chat Preferred  Rehab Office [phone]   [name] [name] 02/23/2023, 3:25 PM

## 2023-02-23 NOTE — Plan of Care (Signed)
  Problem: Education: Goal: Knowledge of General Education information will improve Description: Including pain rating scale, medication(s)/side effects and non-pharmacologic comfort measures Outcome: Progressing   Problem: Health Behavior/Discharge Planning: Goal: Ability to manage health-related needs will improve Outcome: Progressing   Problem: Clinical Measurements: Goal: Ability to maintain clinical measurements within normal limits will improve Outcome: Progressing   Problem: Activity: Goal: Risk for activity intolerance will decrease Outcome: Progressing   Problem: Nutrition: Goal: Adequate nutrition will be maintained Outcome: Progressing   Problem: Coping: Goal: Level of anxiety will decrease Outcome: Progressing   Problem: Pain Management: Goal: General experience of comfort will improve Outcome: Progressing   Problem: Safety: Goal: Ability to remain free from injury will improve Outcome: Progressing

## 2023-02-23 NOTE — Progress Notes (Signed)
 Mobility Specialist: Progress Note   02/23/23 1251  Mobility  Activity Ambulated with assistance in hallway;Ambulated with assistance to bathroom  Level of Assistance Standby assist, set-up cues, supervision of patient - no hands on  Assistive Device Front wheel walker  Distance Ambulated (ft) 60 ft  Activity Response Tolerated well  Mobility Referral Yes  Mobility visit 1 Mobility  Mobility Specialist Start Time (ACUTE ONLY) 1058  Mobility Specialist Stop Time (ACUTE ONLY) 1112  Mobility Specialist Time Calculation (min) (ACUTE ONLY) 14 min    Pt was agreeable to mobility session - received in bed. C/o back soreness. SV throughout. Returned to room without fault. Ambulated to the bathroom and requested to sit there for awhile and have her family help her wash up. Left in BR with all needs met, call bell in reach. NT notified. Family in room.    Ileana Lute Mobility Specialist Please contact via SecureChat or Rehab office at [phone]

## 2023-02-24 DIAGNOSIS — K219 Gastro-esophageal reflux disease without esophagitis: Secondary | ICD-10-CM | POA: Diagnosis not present

## 2023-02-24 DIAGNOSIS — I1310 Hypertensive heart and chronic kidney disease without heart failure, with stage 1 through stage 4 chronic kidney disease, or unspecified chronic kidney disease: Secondary | ICD-10-CM | POA: Diagnosis not present

## 2023-02-24 DIAGNOSIS — R41841 Cognitive communication deficit: Secondary | ICD-10-CM | POA: Diagnosis not present

## 2023-02-24 DIAGNOSIS — N1831 Chronic kidney disease, stage 3a: Secondary | ICD-10-CM | POA: Diagnosis not present

## 2023-02-24 DIAGNOSIS — M5430 Sciatica, unspecified side: Secondary | ICD-10-CM | POA: Diagnosis not present

## 2023-02-24 DIAGNOSIS — J45909 Unspecified asthma, uncomplicated: Secondary | ICD-10-CM | POA: Diagnosis not present

## 2023-02-24 DIAGNOSIS — F411 Generalized anxiety disorder: Secondary | ICD-10-CM | POA: Diagnosis not present

## 2023-02-24 DIAGNOSIS — L89892 Pressure ulcer of other site, stage 2: Secondary | ICD-10-CM | POA: Diagnosis not present

## 2023-02-24 DIAGNOSIS — M5126 Other intervertebral disc displacement, lumbar region: Secondary | ICD-10-CM | POA: Diagnosis not present

## 2023-02-24 DIAGNOSIS — M5442 Lumbago with sciatica, left side: Secondary | ICD-10-CM | POA: Diagnosis not present

## 2023-02-24 DIAGNOSIS — M5441 Lumbago with sciatica, right side: Secondary | ICD-10-CM | POA: Diagnosis not present

## 2023-02-24 DIAGNOSIS — E785 Hyperlipidemia, unspecified: Secondary | ICD-10-CM | POA: Diagnosis not present

## 2023-02-24 DIAGNOSIS — R7303 Prediabetes: Secondary | ICD-10-CM | POA: Diagnosis not present

## 2023-02-24 DIAGNOSIS — R2681 Unsteadiness on feet: Secondary | ICD-10-CM | POA: Diagnosis not present

## 2023-02-24 DIAGNOSIS — Z7189 Other specified counseling: Secondary | ICD-10-CM | POA: Diagnosis not present

## 2023-02-24 DIAGNOSIS — Z8719 Personal history of other diseases of the digestive system: Secondary | ICD-10-CM | POA: Diagnosis not present

## 2023-02-24 DIAGNOSIS — M6281 Muscle weakness (generalized): Secondary | ICD-10-CM | POA: Diagnosis not present

## 2023-02-24 DIAGNOSIS — M545 Low back pain, unspecified: Secondary | ICD-10-CM | POA: Diagnosis not present

## 2023-02-24 DIAGNOSIS — I6529 Occlusion and stenosis of unspecified carotid artery: Secondary | ICD-10-CM | POA: Diagnosis not present

## 2023-02-24 DIAGNOSIS — Z8679 Personal history of other diseases of the circulatory system: Secondary | ICD-10-CM | POA: Diagnosis not present

## 2023-02-24 DIAGNOSIS — I10 Essential (primary) hypertension: Secondary | ICD-10-CM | POA: Diagnosis not present

## 2023-02-24 DIAGNOSIS — R278 Other lack of coordination: Secondary | ICD-10-CM | POA: Diagnosis not present

## 2023-02-24 DIAGNOSIS — I25118 Atherosclerotic heart disease of native coronary artery with other forms of angina pectoris: Secondary | ICD-10-CM | POA: Diagnosis not present

## 2023-02-24 MED ORDER — TRAMADOL HCL 50 MG PO TABS: 50.0000 mg | ORAL_TABLET | Freq: Four times a day (QID) | ORAL | 0 refills | Status: AC | PRN

## 2023-02-24 MED ORDER — FENTANYL CITRATE PF 50 MCG/ML IJ SOSY
12.5000 ug | PREFILLED_SYRINGE | INTRAMUSCULAR | Status: DC | PRN
Start: 2023-02-24 — End: 2023-02-24

## 2023-02-24 MED ORDER — ALPRAZOLAM 0.25 MG PO TABS: 0.2500 mg | ORAL_TABLET | Freq: Every evening | ORAL | 0 refills | Status: DC

## 2023-02-24 MED ORDER — TRAMADOL HCL 50 MG PO TABS: 50.0000 mg | ORAL_TABLET | Freq: Four times a day (QID) | ORAL | Status: DC | PRN

## 2023-02-24 NOTE — Progress Notes (Signed)
 Patient is feeling well, she has mild back pain that is controlled with oral analgesics.  She has tolerated tramadol  in the past, apparently has nausea with hydrocodone .  Positive bowel movements.   BP (!) 135/56 (BP Location: Left Arm)   Pulse 76   Temp 98.1 F (36.7 C) (Oral)   Resp 20   Ht 5' 5 (1.651 m)   Wt 87.5 kg   SpO2 96%   BMI 32.10 kg/m   Neurology awake and alert ENT with mild pallor Cardiovascular with S1 and S2 present and regular, with positive systolic murmur at the right lower sternal border Respiratory with no wheezing or rales Abdomen with no distention  No lower extremity edema.   Dx. Lumbar stenosis sp laminectomy, hypertension.   Continue blood pressure meds, today will hold on amlodipine  but continue with losartan  and carvedilol .  Change hydrocodone  to tramadol  as needed for severe back pain.  Continue right breast wound care.  Patient stable for discharge to SNF today.

## 2023-02-24 NOTE — TOC Transition Note (Signed)
 Transition of Care [facility]) - Discharge Note   Patient Details  Name: Kathryn [name] MRN: [id number] Date of Birth: 22-Nov-1940  Transition of Care [facility]) CM/SW Contact:  [name], LCSW Phone Number: 02/24/2023, 9:44 AM   Clinical Narrative:   Pt discharging to [facility].  RN call report to [phone].      Final next level of care: Skilled Nursing Facility Barriers to Discharge: Barriers Resolved   Patient Goals and CMS Choice Patient states their goals for this hospitalization and ongoing recovery are:: back to normal CMS Medicare.gov Compare Post Acute Care list provided to:: Patient Choice offered to / list presented to : Patient, Adult Children (daughter Kathryn [name])      Discharge Placement              Patient chooses bed at: [facility] Patient to be transferred to facility by: daughter Kathryn [name] Name of family member notified: daughter Kathryn [name] Patient and family notified of of transfer: 02/24/23  Discharge Plan and Services Additional resources added to the After Visit Summary for   In-house Referral: Clinical Social Work   Post Acute Care Choice: Skilled Nursing Facility                               Social Drivers of Health (SDOH) Interventions SDOH Screenings   Food Insecurity: No Food Insecurity (02/19/2023)  Housing: Low Risk  (02/19/2023)  Transportation Needs: No Transportation Needs (02/19/2023)  Utilities: Not At Risk (02/19/2023)  Alcohol Screen: Low Risk  (05/24/2018)  Depression (PHQ2-9): Low Risk  (12/12/2022)  Financial Resource Strain: Low Risk  (06/09/2022)  Physical Activity: Inactive (06/09/2022)  Social Connections: Moderately Integrated (02/19/2023)  Stress: No Stress Concern Present (06/09/2022)  Tobacco Use: Medium Risk (02/20/2023)     Readmission Risk Interventions     No data to display

## 2023-02-24 NOTE — TOC Progression Note (Addendum)
 Transition of Care [facility]) - Progression Note    Patient Details  Name: Kathryn [name] MRN: [id number] Date of Birth: 1940/12/18  Transition [facility] Care [facility]) CM/SW Contact  [name], LCSW Phone Number: 02/24/2023, 8:13 AM  Clinical Narrative:    SNF auth approved: [id number], [id number], 5 days: 1/9-1/13. MD informed.   CSW confirmed with [facility] that they can receive pt today.   Expected Discharge Plan: Skilled Nursing Facility Barriers to Discharge: Continued Medical Work up, SNF Pending bed offer  Expected Discharge Plan and Services In-house Referral: Clinical Social Work   Post Acute Care Choice: Skilled Nursing Facility Living arrangements for the past 2 months: Single Family Home                                       Social Determinants of Health (SDOH) Interventions SDOH Screenings   Food Insecurity: No Food Insecurity (02/19/2023)  Housing: Low Risk  (02/19/2023)  Transportation Needs: No Transportation Needs (02/19/2023)  Utilities: Not At Risk (02/19/2023)  Alcohol Screen: Low Risk  (05/24/2018)  Depression (PHQ2-9): Low Risk  (12/12/2022)  Financial Resource Strain: Low Risk  (06/09/2022)  Physical Activity: Inactive (06/09/2022)  Social Connections: Moderately Integrated (02/19/2023)  Stress: No Stress Concern Present (06/09/2022)  Tobacco Use: Medium Risk (02/20/2023)    Readmission Risk Interventions     No data to display

## 2023-02-24 NOTE — Progress Notes (Addendum)
 Report given to nurse at [facility]. Daughter is aware to give discharge packet to nursing facility once they arrive.

## 2023-02-24 NOTE — Care Management Important Message (Signed)
 Important Message  Patient Details  Name: Kathryn [name] MRN: [id number] Date of Birth: May 20, 1940   Important Message Given:  Yes - Medicare IM     Dorena Bodo 02/24/2023, 10:23 AM

## 2023-02-27 DIAGNOSIS — I25118 Atherosclerotic heart disease of native coronary artery with other forms of angina pectoris: Secondary | ICD-10-CM | POA: Diagnosis not present

## 2023-02-27 DIAGNOSIS — R7303 Prediabetes: Secondary | ICD-10-CM | POA: Diagnosis not present

## 2023-02-27 DIAGNOSIS — Z8679 Personal history of other diseases of the circulatory system: Secondary | ICD-10-CM | POA: Diagnosis not present

## 2023-02-27 DIAGNOSIS — I1310 Hypertensive heart and chronic kidney disease without heart failure, with stage 1 through stage 4 chronic kidney disease, or unspecified chronic kidney disease: Secondary | ICD-10-CM | POA: Diagnosis not present

## 2023-02-27 DIAGNOSIS — J45909 Unspecified asthma, uncomplicated: Secondary | ICD-10-CM | POA: Diagnosis not present

## 2023-02-27 DIAGNOSIS — N1831 Chronic kidney disease, stage 3a: Secondary | ICD-10-CM | POA: Diagnosis not present

## 2023-02-27 DIAGNOSIS — F411 Generalized anxiety disorder: Secondary | ICD-10-CM | POA: Diagnosis not present

## 2023-02-27 DIAGNOSIS — I6529 Occlusion and stenosis of unspecified carotid artery: Secondary | ICD-10-CM | POA: Diagnosis not present

## 2023-02-27 DIAGNOSIS — M5126 Other intervertebral disc displacement, lumbar region: Secondary | ICD-10-CM | POA: Diagnosis not present

## 2023-02-28 DIAGNOSIS — M5441 Lumbago with sciatica, right side: Secondary | ICD-10-CM | POA: Diagnosis not present

## 2023-02-28 DIAGNOSIS — R2681 Unsteadiness on feet: Secondary | ICD-10-CM | POA: Diagnosis not present

## 2023-02-28 DIAGNOSIS — M5430 Sciatica, unspecified side: Secondary | ICD-10-CM | POA: Diagnosis not present

## 2023-02-28 DIAGNOSIS — M5126 Other intervertebral disc displacement, lumbar region: Secondary | ICD-10-CM | POA: Diagnosis not present

## 2023-02-28 DIAGNOSIS — M545 Low back pain, unspecified: Secondary | ICD-10-CM | POA: Diagnosis not present

## 2023-02-28 DIAGNOSIS — M6281 Muscle weakness (generalized): Secondary | ICD-10-CM | POA: Diagnosis not present

## 2023-02-28 DIAGNOSIS — M5442 Lumbago with sciatica, left side: Secondary | ICD-10-CM | POA: Diagnosis not present

## 2023-03-02 DIAGNOSIS — R2681 Unsteadiness on feet: Secondary | ICD-10-CM | POA: Diagnosis not present

## 2023-03-02 DIAGNOSIS — M545 Low back pain, unspecified: Secondary | ICD-10-CM | POA: Diagnosis not present

## 2023-03-02 DIAGNOSIS — M5430 Sciatica, unspecified side: Secondary | ICD-10-CM | POA: Diagnosis not present

## 2023-03-02 DIAGNOSIS — M6281 Muscle weakness (generalized): Secondary | ICD-10-CM | POA: Diagnosis not present

## 2023-03-02 DIAGNOSIS — M5126 Other intervertebral disc displacement, lumbar region: Secondary | ICD-10-CM | POA: Diagnosis not present

## 2023-03-07 DIAGNOSIS — Z8719 Personal history of other diseases of the digestive system: Secondary | ICD-10-CM | POA: Diagnosis not present

## 2023-03-07 DIAGNOSIS — M5126 Other intervertebral disc displacement, lumbar region: Secondary | ICD-10-CM | POA: Diagnosis not present

## 2023-03-07 DIAGNOSIS — F411 Generalized anxiety disorder: Secondary | ICD-10-CM | POA: Diagnosis not present

## 2023-03-08 DIAGNOSIS — M5126 Other intervertebral disc displacement, lumbar region: Secondary | ICD-10-CM | POA: Diagnosis not present

## 2023-03-08 DIAGNOSIS — M545 Low back pain, unspecified: Secondary | ICD-10-CM | POA: Diagnosis not present

## 2023-03-08 DIAGNOSIS — R2681 Unsteadiness on feet: Secondary | ICD-10-CM | POA: Diagnosis not present

## 2023-03-08 DIAGNOSIS — M6281 Muscle weakness (generalized): Secondary | ICD-10-CM | POA: Diagnosis not present

## 2023-03-08 DIAGNOSIS — M5430 Sciatica, unspecified side: Secondary | ICD-10-CM | POA: Diagnosis not present

## 2023-03-10 DIAGNOSIS — F411 Generalized anxiety disorder: Secondary | ICD-10-CM | POA: Diagnosis not present

## 2023-03-10 DIAGNOSIS — Z7189 Other specified counseling: Secondary | ICD-10-CM | POA: Diagnosis not present

## 2023-03-10 DIAGNOSIS — N1831 Chronic kidney disease, stage 3a: Secondary | ICD-10-CM | POA: Diagnosis not present

## 2023-03-10 DIAGNOSIS — J45909 Unspecified asthma, uncomplicated: Secondary | ICD-10-CM | POA: Diagnosis not present

## 2023-03-10 DIAGNOSIS — M5442 Lumbago with sciatica, left side: Secondary | ICD-10-CM | POA: Diagnosis not present

## 2023-03-10 DIAGNOSIS — M5126 Other intervertebral disc displacement, lumbar region: Secondary | ICD-10-CM | POA: Diagnosis not present

## 2023-03-10 DIAGNOSIS — M5441 Lumbago with sciatica, right side: Secondary | ICD-10-CM | POA: Diagnosis not present

## 2023-03-13 ENCOUNTER — Other Ambulatory Visit: Payer: Self-pay | Admitting: Internal Medicine

## 2023-03-13 ENCOUNTER — Telehealth: Payer: Self-pay

## 2023-03-13 ENCOUNTER — Other Ambulatory Visit: Payer: Medicare HMO

## 2023-03-13 DIAGNOSIS — E7800 Pure hypercholesterolemia, unspecified: Secondary | ICD-10-CM

## 2023-03-13 NOTE — Transitions of Care (Post Inpatient/ED Visit) (Signed)
   03/13/2023  Name: Kathryn [name] MRN: [id number] DOB: 1941-02-10  Today's TOC FU Call Status:   Patient's Name and Date of Birth confirmed.  Transition Care Management Follow-up Telephone Call Date of Discharge: 03/11/23 Discharge Facility: [facility]) Type of Discharge: Inpatient Admission How have you been since you were released from the hospital?: Better Any questions or concerns?: No  Items Reviewed: Did you receive and understand the discharge instructions provided?: Yes Medications obtained,verified, and reconciled?: Yes (Medications Reviewed) Any new allergies since your discharge?: No Dietary orders reviewed?: NA Do you have support at home?: Yes People in Home: child(ren), adult  Medications Reviewed Today: Medications Reviewed Today   Medications were not reviewed in this encounter     Home Care and Equipment/Supplies: Were Home Health Services Ordered?: Yes Name of Home Health Agency:: N/A Has Agency set up a time to come to your home?: No Any new equipment or medical supplies ordered?: NA  Functional Questionnaire: Do you need assistance with bathing/showering or dressing?: Yes Do you need assistance with meal preparation?: Yes Do you need assistance with eating?: No Do you have difficulty maintaining continence: No Do you need assistance with getting out of bed/getting out of a chair/moving?: No Do you have difficulty managing or taking your medications?: No  Follow up appointments reviewed: PCP Follow-up appointment confirmed?: Yes Date of PCP follow-up appointment?: 03/22/23 Follow-up Provider: [name] Specialist [facility] Follow-up appointment confirmed?: Yes Date of Specialist follow-up appointment?: 03/13/23 Follow-Up Specialty Provider:: Neurosurgeon Do you need transportation to your follow-up appointment?: No Do you understand care options if your condition(s) worsen?: Yes-patient verbalized understanding    SIGNATURE Randa Lynn, CMA

## 2023-03-14 LAB — LIPID PANEL
Chol/HDL Ratio: 2.5 {ratio} (ref 0.0–4.4)
Cholesterol, Total: 180 mg/dL (ref 100–199)
HDL: 71 mg/dL (ref >39)
LDL Chol Calc (NIH): 95 mg/dL (ref 0–99)
Triglycerides: 73 mg/dL (ref 0–149)
VLDL Cholesterol Cal: 14 mg/dL (ref 5–40)

## 2023-03-15 ENCOUNTER — Ambulatory Visit: Payer: Self-pay

## 2023-03-15 NOTE — Patient Outreach (Signed)
  Care Coordination   Follow Up Visit Note   03/15/2023 Name: Kathryn [name] MRN: [id number] DOB: 1940-11-20  Kathryn [name] is a 83 y.o. year old female who sees [name], MD for primary care. I spoke with  Kathryn [name] by phone today.  What matters to the patients health and wellness today?  Patient would like to make a full recovery from her recent lumbar surgery.     Goals Addressed             This Visit's Progress    To have arthritis pain and back pain with Sciatica evaluated by orthopedics   On track    Care Coordination Interventions: Reviewed provider established plan for pain management Determined patient experienced a recent hospital admission requiring emergency lumbar laminectomy/decompression microdiscectomy 1 Level -L4-L5 Review of patient status, including review of consultant's reports, relevant laboratory and other test results, and medications completed Discussed with patient she is expecting a call from the home health agency to schedule in home PT Discussed patient completed her post op visit with the neurosurgeon on 03/12/23 Counseled on the importance of reporting any/all new or changed pain symptoms or management strategies to pain management provider Advised patient to report to care team affect of pain on daily activities Reviewed with patient prescribed pharmacological and nonpharmacological pain relief strategies    Interventions Today    Flowsheet Row Most Recent Value  Chronic Disease   Chronic disease during today's visit Other  [post lumbar laminectomy]  General Interventions   General Interventions Discussed/Reviewed General Interventions Discussed, General Interventions Reviewed, Doctor Visits, Durable Medical Equipment (DME)  Doctor Visits Discussed/Reviewed Doctor Visits Discussed, Doctor Visits Reviewed, PCP, Specialist  Durable Medical Equipment (DME) Dan Humphreys  Exercise Interventions   Exercise Discussed/Reviewed Exercise  Discussed, Exercise Reviewed, Physical Activity  Physical Activity Discussed/Reviewed Physical Activity Discussed, Physical Activity Reviewed  Education Interventions   Education Provided Provided Education  Provided Verbal Education On Nutrition, Medication, Exercise, When to see the doctor  Nutrition Interventions   Nutrition Discussed/Reviewed Nutrition Discussed, Nutrition Reviewed, Fluid intake  Pharmacy Interventions   Pharmacy Dicussed/Reviewed Pharmacy Topics Discussed, Pharmacy Topics Reviewed, Medications and their functions          SDOH assessments and interventions completed:  Yes     Care Coordination Interventions:  Yes, provided   Follow up plan: Follow up call scheduled for 03/31/23 @12 :30 PM     Encounter Outcome:  Patient Visit Completed

## 2023-03-15 NOTE — Patient Instructions (Addendum)
Visit Information  Thank you for taking time to visit with me today. Please don't hesitate to contact me if I can be of assistance to you.   Following are the goals we discussed today:   Goals Addressed             This Visit's Progress    To have arthritis pain and back pain with Sciatica evaluated by orthopedics   On track    Care Coordination Interventions: Reviewed provider established plan for pain management Determined patient experienced a recent hospital admission requiring emergency lumbar laminectomy/decompression microdiscectomy 1 Level -L4-L5 Review of patient status, including review of consultant's reports, relevant laboratory and other test results, and medications completed Discussed with patient she is expecting a call from the home health agency to schedule in home PT Discussed patient completed her post op visit with the neurosurgeon on 03/12/23 Counseled on the importance of reporting any/all new or changed pain symptoms or management strategies to pain management provider Advised patient to report to care team affect of pain on daily activities Reviewed with patient prescribed pharmacological and nonpharmacological pain relief strategies         Our next appointment is by telephone on 03/31/23 at 12:30 PM  Please call the care guide team at [phone] if you need to cancel or reschedule your appointment.   If you are experiencing a Mental Health or Behavioral Health Crisis or need someone to talk to, please call 1-800-273-TALK (toll free, 24 hour hotline)  The patient verbalized understanding of instructions, educational materials, and care plan provided today and DECLINED offer to receive copy of patient instructions, educational materials, and care plan.   Delsa Sale RN BSN CCM [place]  [facility], [facility] Health Nurse Care Coordinator  Direct Dial: [phone] Website: [email or web address]

## 2023-03-16 DIAGNOSIS — I1310 Hypertensive heart and chronic kidney disease without heart failure, with stage 1 through stage 4 chronic kidney disease, or unspecified chronic kidney disease: Secondary | ICD-10-CM | POA: Diagnosis not present

## 2023-03-16 DIAGNOSIS — I6529 Occlusion and stenosis of unspecified carotid artery: Secondary | ICD-10-CM | POA: Diagnosis not present

## 2023-03-16 DIAGNOSIS — M5126 Other intervertebral disc displacement, lumbar region: Secondary | ICD-10-CM | POA: Diagnosis not present

## 2023-03-16 DIAGNOSIS — F411 Generalized anxiety disorder: Secondary | ICD-10-CM | POA: Diagnosis not present

## 2023-03-16 DIAGNOSIS — I739 Peripheral vascular disease, unspecified: Secondary | ICD-10-CM | POA: Diagnosis not present

## 2023-03-16 DIAGNOSIS — J45909 Unspecified asthma, uncomplicated: Secondary | ICD-10-CM | POA: Diagnosis not present

## 2023-03-16 DIAGNOSIS — N1831 Chronic kidney disease, stage 3a: Secondary | ICD-10-CM | POA: Diagnosis not present

## 2023-03-16 DIAGNOSIS — Z4789 Encounter for other orthopedic aftercare: Secondary | ICD-10-CM | POA: Diagnosis not present

## 2023-03-16 DIAGNOSIS — I25118 Atherosclerotic heart disease of native coronary artery with other forms of angina pectoris: Secondary | ICD-10-CM | POA: Diagnosis not present

## 2023-03-16 NOTE — Progress Notes (Unsigned)
Office Visit Note  Patient: Kathryn [name]             Date of Birth: 18-Jan-1941           MRN: [id number]             PCP: [name], MD Referring: [name], MD Visit Date: 03/30/2023 Occupation: @GUAROCC @  Subjective:  Medication monitoring   History of Present Illness: Kathryn [name] is a 83 y.o. female with history of osteoporosis.  Patient presents today to discuss scheduling her next prolia injection.  Her last injection was administered on 09/13/22.  She has been taking a calcium and vitamin D supplement daily.  She underwent a lumbar laminectomy on 03/02/23 performed by Dr. Conchita Paris.  Patient states she has started to work with a physical therapist at home.  She is using a walker to assist with ambulation.  She states her pain level is more severe now then it was prior to surgery.  She is now having radiating pain down both legs.  She currently rates the pain a 3/10 while seated.  She has been taking tramadol and tylenol for pain relief and remains on lyrica and robaxin as prescribed.       Activities of Daily Living:  Patient reports morning stiffness for all day. Patient Reports nocturnal pain.  Difficulty dressing/grooming: Reports Difficulty climbing stairs: Reports Difficulty getting out of chair: Reports Difficulty using hands for taps, buttons, cutlery, and/or writing: Denies  Review of Systems  Constitutional:  Negative for fatigue.  HENT:  Negative for mouth sores, mouth dryness and nose dryness.   Eyes:  Negative for pain and dryness.  Respiratory:  Negative for shortness of breath and difficulty breathing.   Cardiovascular:  Negative for chest pain and palpitations.  Gastrointestinal:  Positive for constipation. Negative for blood in stool and diarrhea.  Endocrine: Negative for increased urination.  Genitourinary:  Negative for involuntary urination.  Musculoskeletal:  Positive for joint pain, gait problem, joint pain, myalgias, muscle  weakness, morning stiffness, muscle tenderness and myalgias. Negative for joint swelling.  Skin:  Negative for color change, rash, hair loss and sensitivity to sunlight.  Allergic/Immunologic: Negative for susceptible to infections.  Neurological:  Negative for dizziness and headaches.  Hematological:  Negative for swollen glands.  Psychiatric/Behavioral:  Positive for sleep disturbance. Negative for depressed mood. The patient is not nervous/anxious.     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) 12/12/2022   Allergic rhinitis with postnasal drip 12/12/2022   Class 2 obesity due to excess calories with body mass index (BMI) of 35.0 to 35.9 in adult 12/12/2022   Acute bronchitis 12/12/2022   Immunization due 12/12/2022   COVID-19 09/05/2022   Change in bowel habit 04/21/2022   Diverticulosis of colon 04/21/2022   Dysphagia 04/21/2022   Family history of malignant neoplasm of digestive organs 04/21/2022   Class 3 severe obesity due to excess calories with serious comorbidity and body mass index (BMI) of 45.0 to 49.9 in adult [facility]) 04/21/2022   Myositis 04/21/2022  Periumbilical pain 04/21/2022   History of colonic polyps 04/21/2022   Bilateral carotid bruits 08/10/2021   Grief 06/05/2021   Prediabetes 06/05/2021   Spinal stenosis of lumbosacral region 06/05/2021   Coronary artery disease of native artery of native heart with stable angina pectoris (HCC) 06/07/2018   Anxiety 01/18/2018   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   CKD (chronic kidney  disease) stage 2, GFR 60-89 ml/min 01/15/2018   Other abnormal glucose 01/15/2018   Neuralgia, 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   NSTEMI (non-ST elevated myocardial infarction) (HCC) 01/14/2018   Essential hypertension 01/14/2018   Hyperlipidemia 01/14/2018   Carotid stenosis, asymptomatic, bilateral 09/06/2012    Past Medical History:  Diagnosis Date   Anxiety    takes Xanax bid prn   Arthritis    Bruises easily    pt is on Plavix   Chronic back pain    buldging disc   Chronic neck pain    Coronary artery disease    1 stent    Dysphagia    Headache(784.0)    related to cervical issues   History of colonic polyps    Hyperlipidemia    takes Crestor daily   Hypertension    takes Coreg and Diovan daily   Myocardial infarction (HCC) 2019   Osteoporosis    was taking shot 2xyr;but not taking anymore   Peripheral vascular disease (HCC)    Sciatica    Seasonal allergies    takes Zyrtec nightly    Family History  Problem Relation Age of Onset   Heart disease Mother        Heart Dissease before age 61   Heart attack Mother    Cancer Father    Leukemia Sister    Cancer Sister    Lupus Sister    Cancer Brother    Heart disease Brother        Amputation   Heart attack Brother    Heart disease Brother    Heart attack Brother    Dementia Other    Healthy Son    Healthy Daughter    Healthy Daughter    Healthy Daughter    Anesthesia problems Neg Hx    Hypotension Neg Hx    Malignant hyperthermia Neg Hx    Pseudochol deficiency Neg Hx    Past Surgical History:  Procedure Laterality Date   ABDOMINAL HYSTERECTOMY  70's   BUNIONECTOMY     left foot   CARDIAC CATHETERIZATION  2009/2011   1 stent placed   CATARACT EXTRACTION Bilateral    CHOLECYSTECTOMY  2009   COLONOSCOPY     ESOPHAGOGASTRODUODENOSCOPY     LEFT HEART CATH AND CORONARY ANGIOGRAPHY N/A 01/16/2018   Procedure: LEFT HEART CATH AND CORONARY ANGIOGRAPHY;  Surgeon: Elder Negus, MD;  Location: [facility] INVASIVE  CV LAB;  Service: Cardiovascular;  Laterality: N/A;   LUMBAR LAMINECTOMY/DECOMPRESSION MICRODISCECTOMY N/A 02/20/2023   Procedure: LUMBAR LAMINECTOMY/DECOMPRESSION MICRODISCECTOMY 1 LEVEL L4-L5;  Surgeon: Lisbeth Renshaw, MD;  Location: [facility] OR;  Service: Neurosurgery;  Laterality: N/A;   TONSILLECTOMY     at age 46   Social History   Social History Narrative   Not on file   Immunization History  Administered Date(s) Administered   DTaP 10/25/2012   Fluad Quad(high Dose 65+) 12/03/2019, 11/19/2020, 03/23/2022   Influenza, High Dose Seasonal PF 12/21/2017, 11/15/2018   Moderna Covid-19 Fall Seasonal Vaccine 57yrs & older 12/03/2021   Moderna Covid-19 Vaccine Bivalent Booster 23yrs & up 11/27/2020   PFIZER(Purple Top)SARS-COV-2 Vaccination 03/01/2019, 03/22/2019, 11/13/2019,  06/09/2020   Pneumococcal Conjugate-13 07/15/2013   Pneumococcal Polysaccharide-23 03/12/2012   Tdap 12/12/2022   Zoster Recombinant(Shingrix) 11/01/2018, 02/06/2019     Objective: Vital Signs: BP 124/70 (BP Location: Left Arm, Patient Position: Sitting, Cuff Size: Normal)   Pulse 65   Resp 14   Ht 5\' 3"  (1.6 m)   Wt 196 lb 6.4 oz (89.1 kg)   BMI 34.79 kg/m    Physical Exam Vitals and nursing note reviewed.  Constitutional:      Appearance: She is well-developed.  HENT:     Head: Normocephalic and atraumatic.  Eyes:     Conjunctiva/sclera: Conjunctivae normal.  Cardiovascular:     Rate and Rhythm: Normal rate and regular rhythm.     Heart sounds: Murmur heard.  Pulmonary:     Effort: Pulmonary effort is normal.     Breath sounds: Normal breath sounds.  Abdominal:     General: Bowel sounds are normal.     Palpations: Abdomen is soft.  Musculoskeletal:     Cervical back: Normal range of motion.  Lymphadenopathy:     Cervical: No cervical adenopathy.  Skin:    General: Skin is warm and dry.     Capillary Refill: Capillary refill takes less than 2 seconds.  Neurological:     Mental Status: She  is alert and oriented to person, place, and time.  Psychiatric:        Behavior: Behavior normal.      Musculoskeletal Exam: Patient remained seated during examination today. C-spine has good ROM.  Shoulder joints, elbow joints, wrist joints, MCPs, PIPs, and DIPs good ROM with no synovitis.  Knee joints have good ROM with no warmth or effusion.  Ankle joints have good ROM with no tenderness or joint swelling.   CDAI Exam: CDAI Score: -- Patient Global: --; Provider Global: -- Swollen: --; Tender: -- Joint Exam 03/30/2023   No joint exam has been documented for this visit   There is currently no information documented on the homunculus. Go to the Rheumatology activity and complete the homunculus joint exam.  Investigation: No additional findings.  Imaging: No results found.   Recent Labs: Lab Results  Component Value Date   WBC 11.5 (H) 02/23/2023   HGB 9.0 (L) 02/23/2023   PLT 301 02/23/2023   NA 144 02/22/2023   K 3.8 02/22/2023   CL 104 02/22/2023   CO2 25 02/22/2023   GLUCOSE 109 (H) 02/22/2023   BUN 25 (H) 02/22/2023   CREATININE 1.18 (H) 02/22/2023   BILITOT 0.6 02/20/2023   ALKPHOS 60 02/20/2023   AST 14 (L) 02/20/2023   ALT 9 02/20/2023   PROT 6.1 (L) 02/20/2023   ALBUMIN 3.4 (L) 02/20/2023   CALCIUM 8.6 (L) 02/22/2023   GFRAA 46 (L) 12/30/2019    Speciality Comments: No specialty comments available.  Procedures:  No procedures performed Allergies: Codeine and Ibuprofen      Assessment / Plan:     Visit Diagnoses: Localized osteoporosis without current pathological fracture: DEXA 05/20/22: The BMD measured at Femur Neck Right is 0.639 g/cm2 with a T-score of -2.9.  Patient was nave to osteoporosis treatment. She has not had any recurrent falls.  No history of fractures.  No known family history of osteoporosis. She has been taking calcium 600 mg daily and vitamin D 1000 units daily.    Avoided the use of bisphosphonates given history of GERD and  chronic kidney disease.  Avoided the use of Evenity given cardiovascular history including MI  and coronary artery disease. Patient declined the use of Forteo/Tymlos due to being hesitant to proceed with a daily injection. First dose of prolia administered on 09/13/22--tolerated Prolia without any side effects or injection site reactions.  She is due for her neck dose of Prolia so CBC, CMP, and vitamin D were updated today. She will follow up in 6 months or sooner if needed.   Hypocalcemia: Calcium was 8.6-low on 02/22/2023.  CMP updated today.  Medication monitoring encounter - Prolia administered on 09/13/2022 CBC, CMP, and vitamin D will be rechecked today prior to scheduling next prolia injection. - Plan: CBC with Differential/Platelet, COMPLETE METABOLIC PANEL WITH GFR, VITAMIN D 25 Hydroxy (Vit-D Deficiency, Fractures)  Vitamin D deficiency - Vitamin D will be rechecked today. Plan: VITAMIN D 25 Hydroxy (Vit-D Deficiency, Fractures)  History of gastroesophageal reflux (GERD): Plan to avoid oral bisphosphonate therapy.   Stage 3b chronic kidney disease (HCC) -Creatinine was 1.18 and GFR was 46 on 02/22/2023.  CMP with GFR updated today.  Plan: COMPLETE METABOLIC PANEL WITH GFR  Spinal stenosis of lumbosacral region - Previously under the care of Dr. Durward Fortes sciatica.  03/02/23: LUMBAR LAMINECTOMY/DECOMPRESSION MICRODISCECTOMY 1 LEVEL L4-L5 with Lisbeth Renshaw, MD  Currently working with PT at home. Using a walker to assist with ambulation.  Radiating pain down both legs.  Rating pain 3/10 while seated in clinic.  She is taking Tylenol and tramadol for pain relief.  She takes Lyrica and methocarbamol as prescribed.  Other medical conditions are listed as follows:   Carotid stenosis, asymptomatic, bilateral  Coronary artery disease of native artery of native heart with stable angina pectoris (HCC)  Essential hypertension: BP was 124/70 today in the office.   NSTEMI (non-ST  elevated myocardial infarction) (HCC)  Diverticulosis of colon  Neuralgia, postherpetic  Hypertensive nephropathy  Bilateral carotid bruits  Class 3 severe obesity due to excess calories with serious comorbidity and body mass index (BMI) of 45.0 to 49.9 in adult [facility])  Pure hypercholesterolemia  Prediabetes  Orders: Orders Placed This Encounter  Procedures   CBC with Differential/Platelet   COMPLETE METABOLIC PANEL WITH GFR   VITAMIN D 25 Hydroxy (Vit-D Deficiency, Fractures)   No orders of the defined types were placed in this encounter.   Follow-Up Instructions: Return in about 6 months (around 09/27/2023) for Osteoporosis.   Gearldine Bienenstock, PA-C  Note - This record has been created using Dragon software.  Chart creation errors have been sought, but may not always  have been located. Such creation errors do not reflect on  the standard of medical care.

## 2023-03-21 ENCOUNTER — Telehealth: Payer: Medicare HMO | Admitting: Internal Medicine

## 2023-03-21 ENCOUNTER — Other Ambulatory Visit: Payer: Self-pay

## 2023-03-21 ENCOUNTER — Encounter: Payer: Self-pay | Admitting: Internal Medicine

## 2023-03-21 DIAGNOSIS — I1310 Hypertensive heart and chronic kidney disease without heart failure, with stage 1 through stage 4 chronic kidney disease, or unspecified chronic kidney disease: Secondary | ICD-10-CM | POA: Diagnosis not present

## 2023-03-21 DIAGNOSIS — I25118 Atherosclerotic heart disease of native coronary artery with other forms of angina pectoris: Secondary | ICD-10-CM

## 2023-03-21 DIAGNOSIS — R413 Other amnesia: Secondary | ICD-10-CM

## 2023-03-21 DIAGNOSIS — Z4789 Encounter for other orthopedic aftercare: Secondary | ICD-10-CM | POA: Diagnosis not present

## 2023-03-21 DIAGNOSIS — F411 Generalized anxiety disorder: Secondary | ICD-10-CM | POA: Diagnosis not present

## 2023-03-21 DIAGNOSIS — I739 Peripheral vascular disease, unspecified: Secondary | ICD-10-CM | POA: Diagnosis not present

## 2023-03-21 DIAGNOSIS — G894 Chronic pain syndrome: Secondary | ICD-10-CM | POA: Diagnosis not present

## 2023-03-21 DIAGNOSIS — M5126 Other intervertebral disc displacement, lumbar region: Secondary | ICD-10-CM | POA: Diagnosis not present

## 2023-03-21 DIAGNOSIS — N1831 Chronic kidney disease, stage 3a: Secondary | ICD-10-CM | POA: Diagnosis not present

## 2023-03-21 DIAGNOSIS — Z20828 Contact with and (suspected) exposure to other viral communicable diseases: Secondary | ICD-10-CM

## 2023-03-21 DIAGNOSIS — J069 Acute upper respiratory infection, unspecified: Secondary | ICD-10-CM

## 2023-03-21 DIAGNOSIS — I6529 Occlusion and stenosis of unspecified carotid artery: Secondary | ICD-10-CM | POA: Diagnosis not present

## 2023-03-21 DIAGNOSIS — J45909 Unspecified asthma, uncomplicated: Secondary | ICD-10-CM | POA: Diagnosis not present

## 2023-03-21 MED ORDER — BENZONATATE 100 MG PO CAPS: 100.0000 mg | ORAL_CAPSULE | Freq: Three times a day (TID) | ORAL | 1 refills | Status: DC | PRN

## 2023-03-21 NOTE — Progress Notes (Signed)
Virtual Visit via Video Note  I,Kathryn [name], CMA,acting as a Neurosurgeon for Kathryn Aliment, MD.,have documented all relevant documentation on the behalf of Kathryn Aliment, MD,as directed by  Kathryn Aliment, MD while in the presence of Kathryn Aliment, MD.  I connected with Kathryn [name] on 04/02/23 at  4:00 PM EST by a video enabled telemedicine application and verified that I am speaking with the correct person using two identifiers.  Patient Location: Home Provider Location: Office/Clinic  I discussed the limitations, risks, security, and privacy concerns of performing an evaluation and management service by video and the availability of in person appointments. I also discussed with the patient that there may be a patient responsible charge related to this service. The patient expressed understanding and agreed to proceed.  Subjective: PCP: Kathryn Peng, MD  No chief complaint on file.  Patient presents today for hospital follow up. She was admitted on 02/18/23 with worsening back pain for 3 weeks but acutely worse X 2 days with limited mobility.  In the ED, she was found to be hypertensive, labs largely unremarkable.  MRI lumbar spine with large disc protrusion at L4-L5 with progressive severe canal stenosis.  She underwent L4-L5 laminectomy/microdiscectomy on 02/20/23.  At discharge on 02/24/23, she was transferred to SNF for rehab.  She was discharged from [facility] on 03/11/23.     She was discharged on 02/24/23. She was discharged to [facility] and completed rehab on 03/11/23. She states she is feeling much better, but she is still having a lot of pain. Kathryn [name]   However, now she has cough. She states her daughter was diagnosed with RSV. Today, she has persistent cough. It has interrupted her sleep.      ROS: Review of Systems  Constitutional:  Positive for malaise/fatigue.  HENT:  Positive for congestion.   Respiratory:  Positive for cough.   Cardiovascular: Negative.    Gastrointestinal: Negative.   Musculoskeletal:  Positive for back pain.     Current Outpatient Medications:    benzonatate (TESSALON PERLES) 100 MG capsule, Take 1 capsule (100 mg total) by mouth 3 (three) times daily as needed for cough., Disp: 30 capsule, Rfl: 1   acetaminophen (TYLENOL) 325 MG tablet, Take 325 mg by mouth every 6 (six) hours as needed for mild pain., Disp: , Rfl:    ALPRAZolam (XANAX) 0.25 MG tablet, Take 1 tablet (0.25 mg total) by mouth every evening. (Patient taking differently: Take 0.25 mg by mouth as needed.), Disp: 10 tablet, Rfl: 0   amLODipine (NORVASC) 10 MG tablet, TAKE ONE TABLET BY MOUTH ONCE DAILY., Disp: 90 tablet, Rfl: 0   Apoaequorin (PREVAGEN PO), Take 1 tablet by mouth daily., Disp: , Rfl:    Aspirin 81 MG CAPS, , Disp: , Rfl:    CALCIUM PO, Take 1 tablet by mouth daily. 600 mg, Disp: , Rfl:    carvedilol (COREG) 12.5 MG tablet, TAKE (1) TABLET BY MOUTH TWICE DAILY., Disp: 180 tablet, Rfl: 0   cetirizine (ZYRTEC) 10 MG tablet, Take 10 mg by mouth at bedtime., Disp: , Rfl:    denosumab (PROLIA) 60 MG/ML SOSY injection, Inject 60 mg into the skin every 6 (six) months. 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[address], [address], [place] [address].Appt on 08/03/22, Disp: 1 mL, Rfl: 0   dexamethasone (DECADRON) 2 MG tablet, Take 2 mg by mouth 2 (two) times daily., Disp: , Rfl:    diclofenac Sodium (VOLTAREN) 1 % GEL, Apply  4 g topically 4 (four) times daily., Disp: 150 g, Rfl: 1   ezetimibe (ZETIA) 10 MG tablet, TAKE 1 TABLET BY MOUTH ONCE A DAY., Disp: 90 tablet, Rfl: 3   famotidine (PEPCID) 20 MG tablet, TAKE ONE TABLET BY MOUTH ONCE DAILY., Disp: 30 tablet, Rfl: 0   fluticasone (FLONASE) 50 MCG/ACT nasal spray, Place 2 sprays into both nostrils daily., Disp: 16 g, Rfl: 0   losartan (COZAAR) 25 MG tablet, Take 1 tablet (25 mg total) by mouth daily., Disp: 90 tablet, Rfl: 2   methocarbamol (ROBAXIN) 500 MG tablet, Take 500 mg by mouth 2 (two) times daily., Disp: , Rfl:     Multiple Vitamin (MULTIVITAMIN WITH MINERALS) TABS, Take 1 tablet by mouth daily., Disp: , Rfl:    nitroGLYCERIN (NITROSTAT) 0.4 MG SL tablet, Place 1 tablet (0.4 mg total) under the tongue every 5 (five) minutes x 3 doses as needed for chest pain., Disp: 30 tablet, Rfl: 3   ondansetron (ZOFRAN-ODT) 4 MG disintegrating tablet, Take 4 mg by mouth every 6 (six) hours as needed., Disp: , Rfl:    polyethylene glycol (MIRALAX / GLYCOLAX) 17 g packet, Take 17 g by mouth daily. (Patient taking differently: Take 17 g by mouth as needed.), Disp: , Rfl:    pravastatin (PRAVACHOL) 40 MG tablet, Take 1 tablet (40 mg total) by mouth every evening., Disp: 90 tablet, Rfl: 3   pregabalin (LYRICA) 75 MG capsule, TAKE 1 CAPSULE BY MOUTH THREE TIMES A DAY. (Patient taking differently: as needed. TAKE 1 CAPSULE BY MOUTH THREE TIMES A DAY.), Disp: 270 capsule, Rfl: 0   Probiotic Product (PROBIOTIC DAILY PO), Take 1 tablet by mouth daily at 12 noon., Disp: , Rfl:    sertraline (ZOLOFT) 25 MG tablet, TAKE 1 TABLET BY MOUTH DAILY., Disp: 90 tablet, Rfl: 2   tiZANidine (ZANAFLEX) 4 MG tablet, TAKE 1 TABLET BY MOUTH ONCE AT BEDTIME AS NEEDED FOR MUSCLE SPASMS., Disp: 30 tablet, Rfl: 0   traMADol (ULTRAM) 50 MG tablet, Take 1 tablet (50 mg total) by mouth every 6 (six) hours as needed for severe pain (pain score 7-10)., Disp: 15 tablet, Rfl: 0   triamcinolone cream (KENALOG) 0.1 %, Apply 1 Application topically 2 (two) times daily. (Patient taking differently: Apply 1 Application topically as needed.), Disp: 30 g, Rfl: 0  Observations/Objective: There were no vitals filed for this visit. Physical Exam Vitals and nursing note reviewed.  Constitutional:      Appearance: Normal appearance. She is ill-appearing.  HENT:     Head: Normocephalic and atraumatic.  Eyes:     Extraocular Movements: Extraocular movements intact.  Pulmonary:     Effort: Pulmonary effort is normal.     Comments: Coughs throughout  visit Musculoskeletal:     Cervical back: Normal range of motion.  Skin:    General: Skin is warm.  Neurological:     General: No focal deficit present.     Mental Status: She is alert.  Psychiatric:        Mood and Affect: Mood normal.        Behavior: Behavior normal.     Assessment and Plan: Protrusion of lumbar intervertebral disc Assessment & Plan: Hospital records reviewed. She is s/p laminectomy/decompression microdiscectomy.  I will request most recent N/S notes. She is encouraged to take meds as prescribed.  She will consult with N/S for any concerns she has regarding her back pain.    Viral upper respiratory tract infection Assessment & Plan: She  likely has RSV due to recent exposure. I will send rx Tessalon perles. She will continue with OTC cough syrup.  She is advised to avoid dairy while sick, and to drink a hot beverage daily.   She will let me know if her sx persist/worsen.    Chronic pain syndrome Assessment & Plan: She states neurosurgeon is referring her to pain specialist. She should continue with pregabalin 75mg  tid.   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 Chronic, she is encouraged to stay well hydrated, avoid NSAIDs and keep BP controlled to prevent progression of CKD.  She will f/u in three to four months for re-evaluation.    Coronary artery disease of native artery of native heart with stable angina pectoris [facility]) Assessment & Plan: Chronic, LDL goal is less than 70.  She will continue with pravastatin 40mg  daily, ASA 81mg  daily and ezetimibe 10mg  daily. She is encouraged to gradually increase her daily activity as tolerated.    RSV exposure  Other orders -     Benzonatate; Take 1 capsule (100 mg total) by mouth 3 (three) times daily as needed for cough.  Dispense: 30 capsule; Refill:  1  TIME IN VISIT: 13 minutes  Follow Up Instructions: Return if symptoms worsen or fail to improve.   I discussed the assessment and treatment plan with the patient. The patient was provided an opportunity to ask questions, and all were answered. The patient agreed with the plan and demonstrated an understanding of the instructions.   The patient was advised to call back or seek an in-person evaluation if the symptoms worsen or if the condition fails to improve as anticipated.  The above assessment and management plan was discussed with the patient. The patient verbalized understanding of and has agreed to the management plan.   I, Kathryn Aliment, MD, have reviewed all documentation for this visit. The documentation on 03/21/23 for the exam, diagnosis, procedures, and orders are all accurate and complete.

## 2023-03-22 ENCOUNTER — Telehealth: Payer: Self-pay | Admitting: *Deleted

## 2023-03-22 ENCOUNTER — Telehealth: Payer: Medicare HMO | Admitting: Internal Medicine

## 2023-03-22 DIAGNOSIS — Z6835 Body mass index (BMI) 35.0-35.9, adult: Secondary | ICD-10-CM | POA: Diagnosis not present

## 2023-03-22 DIAGNOSIS — M4316 Spondylolisthesis, lumbar region: Secondary | ICD-10-CM | POA: Diagnosis not present

## 2023-03-22 NOTE — Progress Notes (Signed)
 Complex Care Management Care Guide Note  03/22/2023 Name: ASUNA PETH MRN: [id number] DOB: [birth year]  Lorenda VEAR Browner is a 83 y.o. year old female who is a primary care patient of Jarold Medici, MD and is actively engaged with the care management team. I reached out to Lorenda VEAR Browner by phone today to assist with scheduling  with the Licensed Clinical Social Worker.  Follow up plan: Unsuccessful telephone outreach attempt made. A HIPAA compliant phone message was left for the patient providing contact information and requesting a return call.  Harlene Satterfield  [facility]  [facility], [facility] Guide  Direct Dial: [phone]  Fax [phone]

## 2023-03-23 DIAGNOSIS — J45909 Unspecified asthma, uncomplicated: Secondary | ICD-10-CM | POA: Diagnosis not present

## 2023-03-23 DIAGNOSIS — Z4789 Encounter for other orthopedic aftercare: Secondary | ICD-10-CM | POA: Diagnosis not present

## 2023-03-23 DIAGNOSIS — M5126 Other intervertebral disc displacement, lumbar region: Secondary | ICD-10-CM | POA: Diagnosis not present

## 2023-03-23 DIAGNOSIS — N1831 Chronic kidney disease, stage 3a: Secondary | ICD-10-CM | POA: Diagnosis not present

## 2023-03-23 DIAGNOSIS — I1310 Hypertensive heart and chronic kidney disease without heart failure, with stage 1 through stage 4 chronic kidney disease, or unspecified chronic kidney disease: Secondary | ICD-10-CM | POA: Diagnosis not present

## 2023-03-23 DIAGNOSIS — I6529 Occlusion and stenosis of unspecified carotid artery: Secondary | ICD-10-CM | POA: Diagnosis not present

## 2023-03-23 DIAGNOSIS — I739 Peripheral vascular disease, unspecified: Secondary | ICD-10-CM | POA: Diagnosis not present

## 2023-03-23 DIAGNOSIS — I25118 Atherosclerotic heart disease of native coronary artery with other forms of angina pectoris: Secondary | ICD-10-CM | POA: Diagnosis not present

## 2023-03-23 DIAGNOSIS — F411 Generalized anxiety disorder: Secondary | ICD-10-CM | POA: Diagnosis not present

## 2023-03-24 ENCOUNTER — Other Ambulatory Visit: Payer: Self-pay

## 2023-03-24 NOTE — Progress Notes (Signed)
 Complex Care Management Note  Care Guide Note 03/24/2023 Name: Kathryn [name] MRN: [id number] DOB: 02/20/1940  Kathryn [name] is a 83 y.o. year old female who sees Jarold Medici, MD for primary care. I reached out to [employer] by phone today to offer complex care management services.  Kathryn [name] was given information about Complex Care Management services today including:   The Complex Care Management services include support from the care team which includes your Nurse Care Manager, Clinical Social Worker, or Pharmacist.  The Complex Care Management team is here to help remove barriers to the health concerns and goals most important to you. Complex Care Management services are voluntary, and the patient may decline or stop services at any time by request to their care team member.   Complex Care Management Consent Status: Patient agreed to services and verbal consent obtained.   Follow up plan:  Telephone appointment with complex care management team member scheduled for:  2/10  Encounter Outcome:  Patient Scheduled  Kathryn [name]  [facility]  [facility], [facility] Guide  Direct Dial: [phone]  Fax [phone]

## 2023-03-24 NOTE — Progress Notes (Signed)
 Complex Care Management Note Care Guide Note  03/24/2023 Name: Kathryn [name] MRN: [id number] DOB: 26-May-1940   Complex Care Management Outreach Attempts: A second unsuccessful outreach was attempted today to offer the patient with information about available complex care management services.  Follow Up Plan:  Additional outreach attempts will be made to offer the patient complex care management information and services.   Encounter Outcome:  No Answer  Harlene Satterfield  [facility]  [facility], [facility] Guide  Direct Dial: [phone]  Fax [phone]

## 2023-03-27 ENCOUNTER — Telehealth: Payer: Self-pay | Admitting: Licensed Clinical Social Worker

## 2023-03-27 ENCOUNTER — Encounter: Payer: Self-pay | Admitting: Licensed Clinical Social Worker

## 2023-03-27 DIAGNOSIS — I1310 Hypertensive heart and chronic kidney disease without heart failure, with stage 1 through stage 4 chronic kidney disease, or unspecified chronic kidney disease: Secondary | ICD-10-CM | POA: Diagnosis not present

## 2023-03-27 DIAGNOSIS — J45909 Unspecified asthma, uncomplicated: Secondary | ICD-10-CM | POA: Diagnosis not present

## 2023-03-27 DIAGNOSIS — N1831 Chronic kidney disease, stage 3a: Secondary | ICD-10-CM | POA: Diagnosis not present

## 2023-03-27 DIAGNOSIS — I739 Peripheral vascular disease, unspecified: Secondary | ICD-10-CM | POA: Diagnosis not present

## 2023-03-27 DIAGNOSIS — F411 Generalized anxiety disorder: Secondary | ICD-10-CM | POA: Diagnosis not present

## 2023-03-27 DIAGNOSIS — M5126 Other intervertebral disc displacement, lumbar region: Secondary | ICD-10-CM | POA: Diagnosis not present

## 2023-03-27 DIAGNOSIS — Z4789 Encounter for other orthopedic aftercare: Secondary | ICD-10-CM | POA: Diagnosis not present

## 2023-03-27 DIAGNOSIS — I25118 Atherosclerotic heart disease of native coronary artery with other forms of angina pectoris: Secondary | ICD-10-CM | POA: Diagnosis not present

## 2023-03-27 DIAGNOSIS — I6529 Occlusion and stenosis of unspecified carotid artery: Secondary | ICD-10-CM | POA: Diagnosis not present

## 2023-03-27 NOTE — Patient Outreach (Signed)
  Care Coordination   03/27/2023 Name: AMZIE MARCK MRN: [id number] DOB: 20-Aug-1940   Care Coordination Outreach Attempts:  An unsuccessful outreach was attempted for an appointment today.  Follow Up Plan:  Additional outreach attempts will be made to offer the patient complex care management information and services.   Encounter Outcome:  No Answer   Care Coordination Interventions:  No, not indicated    Alease Hunter, LCSW [place]  [address] [facility], [facility] Clinical Social Worker Direct Dial: [phone]  Fax: [phone] Website: [email or web address] 10:12 AM

## 2023-03-30 ENCOUNTER — Telehealth: Payer: Self-pay | Admitting: *Deleted

## 2023-03-30 ENCOUNTER — Ambulatory Visit: Payer: Medicare HMO | Attending: Physician Assistant | Admitting: Physician Assistant

## 2023-03-30 ENCOUNTER — Encounter: Payer: Self-pay | Admitting: Physician Assistant

## 2023-03-30 VITALS — BP 124/70 | HR 65 | Resp 14 | Ht 63.0 in | Wt 196.4 lb

## 2023-03-30 DIAGNOSIS — I10 Essential (primary) hypertension: Secondary | ICD-10-CM

## 2023-03-30 DIAGNOSIS — I214 Non-ST elevation (NSTEMI) myocardial infarction: Secondary | ICD-10-CM

## 2023-03-30 DIAGNOSIS — M816 Localized osteoporosis [Lequesne]: Secondary | ICD-10-CM

## 2023-03-30 DIAGNOSIS — Z8719 Personal history of other diseases of the digestive system: Secondary | ICD-10-CM | POA: Diagnosis not present

## 2023-03-30 DIAGNOSIS — I25118 Atherosclerotic heart disease of native coronary artery with other forms of angina pectoris: Secondary | ICD-10-CM | POA: Diagnosis not present

## 2023-03-30 DIAGNOSIS — B0229 Other postherpetic nervous system involvement: Secondary | ICD-10-CM

## 2023-03-30 DIAGNOSIS — E7800 Pure hypercholesterolemia, unspecified: Secondary | ICD-10-CM

## 2023-03-30 DIAGNOSIS — Z6841 Body Mass Index (BMI) 40.0 and over, adult: Secondary | ICD-10-CM

## 2023-03-30 DIAGNOSIS — M4807 Spinal stenosis, lumbosacral region: Secondary | ICD-10-CM | POA: Diagnosis not present

## 2023-03-30 DIAGNOSIS — N1832 Chronic kidney disease, stage 3b: Secondary | ICD-10-CM

## 2023-03-30 DIAGNOSIS — Z5181 Encounter for therapeutic drug level monitoring: Secondary | ICD-10-CM

## 2023-03-30 DIAGNOSIS — R0989 Other specified symptoms and signs involving the circulatory and respiratory systems: Secondary | ICD-10-CM

## 2023-03-30 DIAGNOSIS — E66813 Obesity, class 3: Secondary | ICD-10-CM

## 2023-03-30 DIAGNOSIS — I129 Hypertensive chronic kidney disease with stage 1 through stage 4 chronic kidney disease, or unspecified chronic kidney disease: Secondary | ICD-10-CM

## 2023-03-30 DIAGNOSIS — E559 Vitamin D deficiency, unspecified: Secondary | ICD-10-CM | POA: Diagnosis not present

## 2023-03-30 DIAGNOSIS — I6523 Occlusion and stenosis of bilateral carotid arteries: Secondary | ICD-10-CM

## 2023-03-30 DIAGNOSIS — K5730 Diverticulosis of large intestine without perforation or abscess without bleeding: Secondary | ICD-10-CM

## 2023-03-30 DIAGNOSIS — R7303 Prediabetes: Secondary | ICD-10-CM

## 2023-03-30 NOTE — Telephone Encounter (Signed)
FYI - Patient's daughter Alcario Drought called, patient has appt this afternoon, patient had recent back surgery, patient's daughter stated that patient has progressive arthritis per the report and wants to know if there are any medications/changes  that can slow down the progression and pain, I advised we do not give pain meds and the patient would need to contact the doctor that did her back surgery.

## 2023-03-31 ENCOUNTER — Telehealth: Payer: Self-pay | Admitting: Licensed Clinical Social Worker

## 2023-03-31 ENCOUNTER — Ambulatory Visit: Payer: Self-pay

## 2023-03-31 DIAGNOSIS — I6529 Occlusion and stenosis of unspecified carotid artery: Secondary | ICD-10-CM | POA: Diagnosis not present

## 2023-03-31 DIAGNOSIS — J45909 Unspecified asthma, uncomplicated: Secondary | ICD-10-CM | POA: Diagnosis not present

## 2023-03-31 DIAGNOSIS — I739 Peripheral vascular disease, unspecified: Secondary | ICD-10-CM | POA: Diagnosis not present

## 2023-03-31 DIAGNOSIS — I1310 Hypertensive heart and chronic kidney disease without heart failure, with stage 1 through stage 4 chronic kidney disease, or unspecified chronic kidney disease: Secondary | ICD-10-CM | POA: Diagnosis not present

## 2023-03-31 DIAGNOSIS — F411 Generalized anxiety disorder: Secondary | ICD-10-CM | POA: Diagnosis not present

## 2023-03-31 DIAGNOSIS — N1831 Chronic kidney disease, stage 3a: Secondary | ICD-10-CM | POA: Diagnosis not present

## 2023-03-31 DIAGNOSIS — Z4789 Encounter for other orthopedic aftercare: Secondary | ICD-10-CM | POA: Diagnosis not present

## 2023-03-31 DIAGNOSIS — M5126 Other intervertebral disc displacement, lumbar region: Secondary | ICD-10-CM | POA: Diagnosis not present

## 2023-03-31 DIAGNOSIS — I25118 Atherosclerotic heart disease of native coronary artery with other forms of angina pectoris: Secondary | ICD-10-CM | POA: Diagnosis not present

## 2023-03-31 LAB — COMPLETE METABOLIC PANEL WITH GFR
AG Ratio: 1.8 (calc) (ref 1.0–2.5)
ALT: 7 U/L (ref 6–29)
AST: 14 U/L (ref 10–35)
Albumin: 4.2 g/dL (ref 3.6–5.1)
Alkaline phosphatase (APISO): 82 U/L (ref 37–153)
BUN/Creatinine Ratio: 12 (calc) (ref 6–22)
BUN: 14 mg/dL (ref 7–25)
CO2: 21 mmol/L (ref 20–32)
Calcium: 9.3 mg/dL (ref 8.6–10.4)
Chloride: 106 mmol/L (ref 98–110)
Creat: 1.14 mg/dL — ABNORMAL HIGH (ref 0.60–0.95)
Globulin: 2.4 g/dL (ref 1.9–3.7)
Glucose, Bld: 85 mg/dL (ref 65–99)
Potassium: 4.1 mmol/L (ref 3.5–5.3)
Sodium: 144 mmol/L (ref 135–146)
Total Bilirubin: 0.5 mg/dL (ref 0.2–1.2)
Total Protein: 6.6 g/dL (ref 6.1–8.1)
eGFR: 47 mL/min/{1.73_m2} — ABNORMAL LOW (ref >=60)

## 2023-03-31 LAB — CBC WITH DIFFERENTIAL/PLATELET
Absolute Lymphocytes: 1709 {cells}/uL (ref 850–3900)
Absolute Monocytes: 819 {cells}/uL (ref 200–950)
Basophils Absolute: 53 {cells}/uL (ref 0–200)
Basophils Relative: 0.6 %
Eosinophils Absolute: 312 {cells}/uL (ref 15–500)
Eosinophils Relative: 3.5 %
HCT: 39.2 % (ref 35.0–45.0)
Hemoglobin: 11.4 g/dL — ABNORMAL LOW (ref 11.7–15.5)
MCH: 22.9 pg — ABNORMAL LOW (ref 27.0–33.0)
MCHC: 29.1 g/dL — ABNORMAL LOW (ref 32.0–36.0)
MCV: 78.9 fL — ABNORMAL LOW (ref 80.0–100.0)
MPV: 10.6 fL (ref 7.5–12.5)
Monocytes Relative: 9.2 %
Neutro Abs: 6008 {cells}/uL (ref 1500–7800)
Neutrophils Relative %: 67.5 %
Platelets: 428 10*3/uL — ABNORMAL HIGH (ref 140–400)
RBC: 4.97 10*6/uL (ref 3.80–5.10)
RDW: 14 % (ref 11.0–15.0)
Total Lymphocyte: 19.2 %
WBC: 8.9 10*3/uL (ref 3.8–10.8)

## 2023-03-31 LAB — VITAMIN D 25 HYDROXY (VIT D DEFICIENCY, FRACTURES): Vit D, 25-Hydroxy: 53 ng/mL (ref 30–100)

## 2023-03-31 NOTE — Patient Instructions (Signed)
Visit Information  Thank you for taking time to visit with me today. Please don't hesitate to contact me if I can be of assistance to you.   Following are the goals we discussed today:   Goals Addressed             This Visit's Progress    To have arthritis pain and back pain with Sciatica evaluated by orthopedics   On track    Care Coordination Interventions: Completed successful outbound call with daughter Alcario Drought  Reviewed provider established plan for pain management Counseled on the importance of reporting any/all new or changed pain symptoms or management strategies to pain management provider Advised patient to report to care team affect of pain on daily activities Reviewed with patient prescribed pharmacological and nonpharmacological pain relief strategies Confirmed patient is working with in home PT/OT Discussed SDOH needs to include a caregiver for respite care and emergency system Discussed plans with daughter for ongoing care coordination follow up and provided direct contact information for nurse care coordinator Sent in basket message to Jenel Lucks LCSW Daughter will work with LCSW Jenel Lucks for caregiver resources and emergency alert needs Patient will continue to work with in home PT/OT as directed Patient will participate in her HEP as instructed by PT/OT Patient/daughter will report any/all new or changed pain symptoms or management strategies to pain management provider Daughter Alcario Drought will contact this nurse to schedule a follow up call once she determine a good date/time            Please call the care guide team at [phone] if you need to cancel or reschedule your appointment.   If you are experiencing a Mental Health or Behavioral Health Crisis or need someone to talk to, please call 1-800-273-TALK (toll free, 24 hour hotline)  Patient verbalizes understanding of instructions and care plan provided today and agrees to view in MyChart. Active  MyChart status and patient understanding of how to access instructions and care plan via MyChart confirmed with patient.     Delsa Sale RN BSN CCM [place]  [facility], [place] [facility] Nurse Care Coordinator  Direct Dial: [phone] Website: [email or web address]

## 2023-03-31 NOTE — Progress Notes (Signed)
Vitamin D WNL.

## 2023-03-31 NOTE — Patient Outreach (Signed)
  Care Coordination   Initial Visit Note   03/31/2023 Name: Kathryn [name] MRN: [id number] DOB: 1941-01-01  Kathryn [name] is a 83 y.o. year old female who sees [name], MD for primary care. I spoke with  Kathryn [name]'s daughter, Alcario Drought, by phone today.  What matters to the patients health and wellness today?  Level of Care    Goals Addressed             This Visit's Progress    Obtain Supportive Resources-Aid Support   On track    Activities and task to complete in order to accomplish goals.   Keep all upcoming appointments discussed today Continue with compliance of taking medication prescribed by Doctor Implement healthy coping skills discussed to assist with management of symptoms Continue working with [facility] care team to assist with goals identified Review supportive resources provided via email         SDOH assessments and interventions completed:  No     Care Coordination Interventions:  Yes, provided  Interventions Today    Flowsheet Row Most Recent Value  Chronic Disease   Chronic disease during today's visit Chronic Kidney Disease/End Stage Renal Disease (ESRD), Other  [GAD]  General Interventions   General Interventions Discussed/Reviewed General Interventions Discussed, Doctor Visits, Level of Care  Doctor Visits Discussed/Reviewed Doctor Visits Discussed  Level of Care Personal Care Services  Mental Health Interventions   Mental Health Discussed/Reviewed Mental Health Discussed, Coping Strategies  [Pt recieves strong support from adult daughters]  Safety Interventions   Safety Discussed/Reviewed Safety Discussed, Fall Risk  [Daughters have been rotating caring for pt since d/c from SNF]       Follow up plan: Follow up call scheduled for 2 weeks    Encounter Outcome:  Patient Visit Completed   Jenel Lucks, LCSW [place]  [facility], [facility] Clinical Social Worker Direct Dial: [phone]  Fax:  [phone] Website: [email or web address] 4:39 PM

## 2023-03-31 NOTE — Progress Notes (Signed)
Hemoglobin remains borderline low but has improved-11.4.   Plt count is slightly elevated.

## 2023-03-31 NOTE — Patient Outreach (Signed)
  Care Coordination   Follow Up Visit Note   03/31/2023 Name: Kathryn [name] MRN: [id number] DOB: [birth year]  Kathryn [name] is a 83 y.o. year old female who sees [name], MD for primary care. I spoke with daughter Kathryn [name] by phone today.  What matters to the patients health and wellness today?  Patient and daughter Kathryn [name] would like to learn about resources to help with caregiver respite care and emergency alert systems.     Goals Addressed             This Visit's Progress    To have arthritis pain and back pain with Sciatica evaluated by orthopedics   On track    Care Coordination Interventions: Completed successful outbound call with daughter Kathryn [name]  Reviewed provider established plan for pain management Counseled on the importance of reporting any/all new or changed pain symptoms or management strategies to pain management provider Advised patient to report to care team affect of pain on daily activities Reviewed with patient prescribed pharmacological and nonpharmacological pain relief strategies Confirmed patient is working with in home PT/OT Discussed SDOH needs to include a caregiver for respite care and emergency system Discussed plans with daughter for ongoing care coordination follow up and provided direct contact information for nurse care coordinator Sent in basket message to Jenel Lucks LCSW Daughter will work with LCSW Jenel Lucks for caregiver resources and emergency alert needs Patient will continue to work with in home PT/OT as directed Patient will participate in her HEP as instructed by PT/OT Patient/daughter will report any/all new or changed pain symptoms or management strategies to pain management provider Daughter Kathryn [name] will contact this nurse to schedule a follow up call once she determine a good date/time    Interventions Today    Flowsheet Row Most Recent Value  Chronic Disease   Chronic disease during today's visit Other   [s/p laminectomy]  General Interventions   General Interventions Discussed/Reviewed General Interventions Discussed, General Interventions Reviewed, Doctor Visits, Durable Medical Equipment (DME), Communication with  Doctor Visits Discussed/Reviewed Doctor Visits Discussed, Doctor Visits Reviewed, PCP  Durable Medical Equipment (DME) Dan Humphreys  Communication with Social Work  Jenel Lucks LCSW]  Exercise Interventions   Exercise Discussed/Reviewed Exercise Reviewed, Physical Activity, Exercise Discussed, Assistive device use and maintanence  Physical Activity Discussed/Reviewed Physical Activity Discussed, Physical Activity Reviewed  Education Interventions   Education Provided Provided Education  Provided Verbal Education On Medication, Exercise, When to see the doctor  Pharmacy Interventions   Pharmacy Dicussed/Reviewed Pharmacy Topics Discussed, Pharmacy Topics Reviewed  Safety Interventions   Safety Discussed/Reviewed Home Safety, Fall Risk, Safety Reviewed, Safety Discussed  Home Safety Assistive Devices          SDOH assessments and interventions completed:  No     Care Coordination Interventions:  Yes, provided   Follow up plan: Daughter will call to schedule next RN CCM follow up call   Encounter Outcome:  Patient Visit Completed

## 2023-03-31 NOTE — Patient Instructions (Signed)
Visit Information  Thank you for taking time to visit with me today. Please don't hesitate to contact me if I can be of assistance to you.   Following are the goals we discussed today:   Goals Addressed             This Visit's Progress    Obtain Supportive Resources-Aid Support   On track    Activities and task to complete in order to accomplish goals.   Keep all upcoming appointments discussed today Continue with compliance of taking medication prescribed by Doctor Implement healthy coping skills discussed to assist with management of symptoms Continue working with [facility] care team to assist with goals identified Review supportive resources provided via email         Our next appointment is by telephone on 2/26 at 3:30 PM  Please call the care guide team at [phone] if you need to cancel or reschedule your appointment.   If you are experiencing a Mental Health or Behavioral Health Crisis or need someone to talk to, please call the Suicide and Crisis Lifeline: 988 call 911   Patient verbalizes understanding of instructions and care plan provided today and agrees to view in MyChart. Active MyChart status and patient understanding of how to access instructions and care plan via MyChart confirmed with patient.     Windy Fast [facility]  [facility], [facility] Clinical Social Worker Direct Dial: [phone]  Fax: [phone] Website: [email or web address] 4:40 PM

## 2023-04-02 DIAGNOSIS — G894 Chronic pain syndrome: Secondary | ICD-10-CM | POA: Insufficient documentation

## 2023-04-02 DIAGNOSIS — J069 Acute upper respiratory infection, unspecified: Secondary | ICD-10-CM | POA: Insufficient documentation

## 2023-04-02 NOTE — Assessment & Plan Note (Addendum)
 Hospital records reviewed. She is s/p laminectomy/decompression microdiscectomy.  I will request most recent N/S notes. She is encouraged to take meds as prescribed.  She will consult with N/S for any concerns she has regarding her back pain.

## 2023-04-02 NOTE — Assessment & Plan Note (Signed)
 She states neurosurgeon is referring her to pain specialist. She should continue with pregabalin 75mg  tid.

## 2023-04-02 NOTE — Assessment & Plan Note (Signed)
 Chronic, LDL goal is less than 70.  She will continue with pravastatin 40mg  daily, ASA 81mg  daily and ezetimibe 10mg  daily. She is encouraged to gradually increase her daily activity as tolerated.

## 2023-04-02 NOTE — Progress Notes (Signed)
 Creatinine remains elevated-1.14 and GFR is low-47-improving. Avoid the use of NSAIDs.  Rest of CMP WNL.

## 2023-04-02 NOTE — Assessment & Plan Note (Signed)
 Chronic, no med changes. Chronic, she is encouraged to stay well hydrated, avoid NSAIDs and keep BP controlled to prevent progression of CKD.  She will f/u in three to four months for re-evaluation.

## 2023-04-02 NOTE — Assessment & Plan Note (Signed)
 She likely has RSV due to recent exposure. I will send rx Tessalon perles. She will continue with OTC cough syrup.  She is advised to avoid dairy while sick, and to drink a hot beverage daily.   She will let me know if her sx persist/worsen.

## 2023-04-03 DIAGNOSIS — I739 Peripheral vascular disease, unspecified: Secondary | ICD-10-CM | POA: Diagnosis not present

## 2023-04-03 DIAGNOSIS — I1310 Hypertensive heart and chronic kidney disease without heart failure, with stage 1 through stage 4 chronic kidney disease, or unspecified chronic kidney disease: Secondary | ICD-10-CM | POA: Diagnosis not present

## 2023-04-03 DIAGNOSIS — J45909 Unspecified asthma, uncomplicated: Secondary | ICD-10-CM | POA: Diagnosis not present

## 2023-04-03 DIAGNOSIS — I25118 Atherosclerotic heart disease of native coronary artery with other forms of angina pectoris: Secondary | ICD-10-CM | POA: Diagnosis not present

## 2023-04-03 DIAGNOSIS — Z4789 Encounter for other orthopedic aftercare: Secondary | ICD-10-CM | POA: Diagnosis not present

## 2023-04-03 DIAGNOSIS — M5126 Other intervertebral disc displacement, lumbar region: Secondary | ICD-10-CM | POA: Diagnosis not present

## 2023-04-03 DIAGNOSIS — F411 Generalized anxiety disorder: Secondary | ICD-10-CM | POA: Diagnosis not present

## 2023-04-03 DIAGNOSIS — N1831 Chronic kidney disease, stage 3a: Secondary | ICD-10-CM | POA: Diagnosis not present

## 2023-04-03 DIAGNOSIS — I6529 Occlusion and stenosis of unspecified carotid artery: Secondary | ICD-10-CM | POA: Diagnosis not present

## 2023-04-06 ENCOUNTER — Encounter: Payer: Medicare Other | Admitting: Internal Medicine

## 2023-04-07 DIAGNOSIS — I739 Peripheral vascular disease, unspecified: Secondary | ICD-10-CM | POA: Diagnosis not present

## 2023-04-07 DIAGNOSIS — N1831 Chronic kidney disease, stage 3a: Secondary | ICD-10-CM | POA: Diagnosis not present

## 2023-04-07 DIAGNOSIS — F411 Generalized anxiety disorder: Secondary | ICD-10-CM | POA: Diagnosis not present

## 2023-04-07 DIAGNOSIS — I1310 Hypertensive heart and chronic kidney disease without heart failure, with stage 1 through stage 4 chronic kidney disease, or unspecified chronic kidney disease: Secondary | ICD-10-CM | POA: Diagnosis not present

## 2023-04-07 DIAGNOSIS — I25118 Atherosclerotic heart disease of native coronary artery with other forms of angina pectoris: Secondary | ICD-10-CM | POA: Diagnosis not present

## 2023-04-07 DIAGNOSIS — I6529 Occlusion and stenosis of unspecified carotid artery: Secondary | ICD-10-CM | POA: Diagnosis not present

## 2023-04-07 DIAGNOSIS — Z4789 Encounter for other orthopedic aftercare: Secondary | ICD-10-CM | POA: Diagnosis not present

## 2023-04-07 DIAGNOSIS — J45909 Unspecified asthma, uncomplicated: Secondary | ICD-10-CM | POA: Diagnosis not present

## 2023-04-07 DIAGNOSIS — M5126 Other intervertebral disc displacement, lumbar region: Secondary | ICD-10-CM | POA: Diagnosis not present

## 2023-04-10 ENCOUNTER — Telehealth: Payer: Self-pay | Admitting: Pharmacist

## 2023-04-10 ENCOUNTER — Encounter: Payer: Self-pay | Admitting: Physician Assistant

## 2023-04-10 ENCOUNTER — Other Ambulatory Visit (HOSPITAL_COMMUNITY): Payer: Self-pay

## 2023-04-10 ENCOUNTER — Other Ambulatory Visit: Payer: Self-pay | Admitting: Pharmacist

## 2023-04-10 DIAGNOSIS — I1310 Hypertensive heart and chronic kidney disease without heart failure, with stage 1 through stage 4 chronic kidney disease, or unspecified chronic kidney disease: Secondary | ICD-10-CM | POA: Diagnosis not present

## 2023-04-10 DIAGNOSIS — N1831 Chronic kidney disease, stage 3a: Secondary | ICD-10-CM | POA: Diagnosis not present

## 2023-04-10 DIAGNOSIS — I6529 Occlusion and stenosis of unspecified carotid artery: Secondary | ICD-10-CM | POA: Diagnosis not present

## 2023-04-10 DIAGNOSIS — F411 Generalized anxiety disorder: Secondary | ICD-10-CM | POA: Diagnosis not present

## 2023-04-10 DIAGNOSIS — I739 Peripheral vascular disease, unspecified: Secondary | ICD-10-CM | POA: Diagnosis not present

## 2023-04-10 DIAGNOSIS — M5126 Other intervertebral disc displacement, lumbar region: Secondary | ICD-10-CM | POA: Diagnosis not present

## 2023-04-10 DIAGNOSIS — I25118 Atherosclerotic heart disease of native coronary artery with other forms of angina pectoris: Secondary | ICD-10-CM | POA: Diagnosis not present

## 2023-04-10 DIAGNOSIS — Z4789 Encounter for other orthopedic aftercare: Secondary | ICD-10-CM | POA: Diagnosis not present

## 2023-04-10 DIAGNOSIS — J45909 Unspecified asthma, uncomplicated: Secondary | ICD-10-CM | POA: Diagnosis not present

## 2023-04-10 DIAGNOSIS — M810 Age-related osteoporosis without current pathological fracture: Secondary | ICD-10-CM | POA: Insufficient documentation

## 2023-04-10 NOTE — Progress Notes (Signed)
 Therapy plan placed for Prolia SQ [phone]) for [facility] to start benefits investigation  Diagnosis: osteoporosis  Provider: Dr. Pollyann Savoy and Sherron Ales, PA-C  Dose: 60mg  SQ  every 6 months  Last Clinic Visit: 03/30/23 Next Clinic Visit: 09/28/23  Pertinent Labs: CBC, CMP, Vitamin D on 03/30/23  Chesley Mires, PharmD, MPH, BCPS, CPP Clinical Pharmacist (Rheumatology and Pulmonology)

## 2023-04-10 NOTE — Telephone Encounter (Signed)
 Patient's Prolia was due on 03/12/23. Per test claim:   Spoke with patient's daughter. Will place referral to [facility]  Chesley Mires, PharmD, MPH, BCPS, CPP Clinical Pharmacist (Rheumatology and Pulmonology)

## 2023-04-11 ENCOUNTER — Ambulatory Visit: Payer: Medicare HMO | Admitting: Internal Medicine

## 2023-04-11 ENCOUNTER — Other Ambulatory Visit: Payer: Self-pay

## 2023-04-11 ENCOUNTER — Telehealth: Payer: Self-pay | Admitting: Pharmacy Technician

## 2023-04-11 DIAGNOSIS — I1310 Hypertensive heart and chronic kidney disease without heart failure, with stage 1 through stage 4 chronic kidney disease, or unspecified chronic kidney disease: Secondary | ICD-10-CM

## 2023-04-11 MED ORDER — AMLODIPINE BESYLATE 10 MG PO TABS: 10.0000 mg | ORAL_TABLET | Freq: Every day | ORAL | 1 refills | Status: DC

## 2023-04-11 NOTE — Telephone Encounter (Signed)
 Devki, Fyi note:  patient will be scheduled as soon as possible.  Auth Submission: APPROVED Site of care: Site of care: [facility] Payer: [employer] MEDICare Medication & CPT/J Code(s) submitted: Prolia (Denosumab) [id number] Route of submission (phone, fax, portal): portal Phone # Fax # Auth type: Buy/Bill PB Units/visits requested: 2 doses Reference number: [id number] Approval from: 04/11/23 to 02/14/24

## 2023-04-12 ENCOUNTER — Encounter: Payer: Self-pay | Admitting: Licensed Clinical Social Worker

## 2023-04-12 ENCOUNTER — Ambulatory Visit: Payer: Self-pay | Admitting: Licensed Clinical Social Worker

## 2023-04-12 NOTE — Patient Outreach (Signed)
 Care Coordination   Follow Up Visit Note   04/12/2023 Name: Kathryn [name] MRN: [id number] DOB: Sep 17, 1940  Kathryn [name] is a 83 y.o. year old female who sees [name], MD for primary care. I spoke with  Kathryn [name] Roots's daughter, Kathryn [name], by phone today.  What matters to the patients health and wellness today?  Level of Care, Symptom Management    Goals Addressed             This Visit's Progress    Obtain Supportive Resources-Aid Support   On track    Activities and task to complete in order to accomplish goals.   Keep all upcoming appointments discussed today Continue with compliance of taking medication prescribed by Doctor Implement healthy coping skills discussed to assist with management of symptoms Continue working with [facility] care team to assist with goals identified Review supportive resources provided via email         SDOH assessments and interventions completed:  No     Care Coordination Interventions:  Yes, provided  Interventions Today    Flowsheet Row Most Recent Value  Chronic Disease   Chronic disease during today's visit Chronic Kidney Disease/End Stage Renal Disease (ESRD)  [GAD]  General Interventions   General Interventions Discussed/Reviewed General Interventions Reviewed, Doctor Visits, Level of Care  Doctor Visits Discussed/Reviewed Doctor Visits Reviewed  Level of Care Personal Care Services  [Pt continues to participate in [facility] PT/OT. Pt is not interested in residing with family,  therefore, daughters will review listing of personal care service agencies for add'l assistance, as needed]  Exercise Interventions   Exercise Discussed/Reviewed Exercise Reviewed, Physical Activity  [Pt continues to participate in exercises provided by OT/PT with walker]  Physical Activity Discussed/Reviewed Physical Activity Reviewed  Mental Health Interventions   Mental Health Discussed/Reviewed Mental Health Reviewed, Coping Strategies,  Anxiety, Grief and Loss  [Pt's spouse passed Nov 2024. Family report being really proud of pt and her progress. Strategies discussed to assist with any anxiety symptoms, as needed. Pt continues to receive strong support from family]  Safety Interventions   Safety Discussed/Reviewed Safety Reviewed, Fall Risk, Home Safety       Follow up plan: Follow up call scheduled for 6-8 weeks    Encounter Outcome:  Patient Visit Completed   Jenel Lucks, LCSW [place]  [facility], [facility] Clinical Social Worker Direct Dial: [phone]  Fax: [phone] Website: [email or web address] 10:08 AM

## 2023-04-12 NOTE — Patient Instructions (Signed)
 Visit Information  Thank you for taking time to visit with me today. Please don't hesitate to contact me if I can be of assistance to you.   Following are the goals we discussed today:   Goals Addressed             This Visit's Progress    Obtain Supportive Resources-Aid Support   On track    Activities and task to complete in order to accomplish goals.   Keep all upcoming appointments discussed today Continue with compliance of taking medication prescribed by Doctor Implement healthy coping skills discussed to assist with management of symptoms Continue working with [facility] care team to assist with goals identified Review supportive resources provided via email         Our next appointment is by telephone on 4/16 at 9:30 AM  Please call the care guide team at [phone] if you need to cancel or reschedule your appointment.   If you are experiencing a Mental Health or Behavioral Health Crisis or need someone to talk to, please call the Suicide and Crisis Lifeline: 988 call 911   Patient verbalizes understanding of instructions and care plan provided today and agrees to view in MyChart. Active MyChart status and patient understanding of how to access instructions and care plan via MyChart confirmed with patient.     Windy Fast [facility]  [facility], [facility] Clinical Social Worker Direct Dial: [phone]  Fax: [phone] Website: [email or web address] 10:09 AM

## 2023-04-13 DIAGNOSIS — I6529 Occlusion and stenosis of unspecified carotid artery: Secondary | ICD-10-CM | POA: Diagnosis not present

## 2023-04-13 DIAGNOSIS — N1831 Chronic kidney disease, stage 3a: Secondary | ICD-10-CM | POA: Diagnosis not present

## 2023-04-13 DIAGNOSIS — I739 Peripheral vascular disease, unspecified: Secondary | ICD-10-CM | POA: Diagnosis not present

## 2023-04-13 DIAGNOSIS — J45909 Unspecified asthma, uncomplicated: Secondary | ICD-10-CM | POA: Diagnosis not present

## 2023-04-13 DIAGNOSIS — Z4789 Encounter for other orthopedic aftercare: Secondary | ICD-10-CM | POA: Diagnosis not present

## 2023-04-13 DIAGNOSIS — F411 Generalized anxiety disorder: Secondary | ICD-10-CM | POA: Diagnosis not present

## 2023-04-13 DIAGNOSIS — I1310 Hypertensive heart and chronic kidney disease without heart failure, with stage 1 through stage 4 chronic kidney disease, or unspecified chronic kidney disease: Secondary | ICD-10-CM | POA: Diagnosis not present

## 2023-04-13 DIAGNOSIS — I25118 Atherosclerotic heart disease of native coronary artery with other forms of angina pectoris: Secondary | ICD-10-CM | POA: Diagnosis not present

## 2023-04-13 DIAGNOSIS — M5126 Other intervertebral disc displacement, lumbar region: Secondary | ICD-10-CM | POA: Diagnosis not present

## 2023-04-14 NOTE — Progress Notes (Signed)
 Prolia scheduled for 04/25/2023

## 2023-04-15 DIAGNOSIS — F411 Generalized anxiety disorder: Secondary | ICD-10-CM | POA: Diagnosis not present

## 2023-04-15 DIAGNOSIS — I1310 Hypertensive heart and chronic kidney disease without heart failure, with stage 1 through stage 4 chronic kidney disease, or unspecified chronic kidney disease: Secondary | ICD-10-CM | POA: Diagnosis not present

## 2023-04-15 DIAGNOSIS — N1831 Chronic kidney disease, stage 3a: Secondary | ICD-10-CM | POA: Diagnosis not present

## 2023-04-15 DIAGNOSIS — J45909 Unspecified asthma, uncomplicated: Secondary | ICD-10-CM | POA: Diagnosis not present

## 2023-04-15 DIAGNOSIS — Z4789 Encounter for other orthopedic aftercare: Secondary | ICD-10-CM | POA: Diagnosis not present

## 2023-04-15 DIAGNOSIS — I739 Peripheral vascular disease, unspecified: Secondary | ICD-10-CM | POA: Diagnosis not present

## 2023-04-15 DIAGNOSIS — M5126 Other intervertebral disc displacement, lumbar region: Secondary | ICD-10-CM | POA: Diagnosis not present

## 2023-04-15 DIAGNOSIS — I6529 Occlusion and stenosis of unspecified carotid artery: Secondary | ICD-10-CM | POA: Diagnosis not present

## 2023-04-15 DIAGNOSIS — I25118 Atherosclerotic heart disease of native coronary artery with other forms of angina pectoris: Secondary | ICD-10-CM | POA: Diagnosis not present

## 2023-04-19 ENCOUNTER — Other Ambulatory Visit (HOSPITAL_COMMUNITY): Payer: Self-pay | Admitting: Internal Medicine

## 2023-04-19 DIAGNOSIS — Z1231 Encounter for screening mammogram for malignant neoplasm of breast: Secondary | ICD-10-CM

## 2023-04-21 ENCOUNTER — Other Ambulatory Visit: Payer: Self-pay | Admitting: Cardiology

## 2023-04-21 DIAGNOSIS — N1831 Chronic kidney disease, stage 3a: Secondary | ICD-10-CM | POA: Diagnosis not present

## 2023-04-21 DIAGNOSIS — J45909 Unspecified asthma, uncomplicated: Secondary | ICD-10-CM | POA: Diagnosis not present

## 2023-04-21 DIAGNOSIS — I25118 Atherosclerotic heart disease of native coronary artery with other forms of angina pectoris: Secondary | ICD-10-CM | POA: Diagnosis not present

## 2023-04-21 DIAGNOSIS — F411 Generalized anxiety disorder: Secondary | ICD-10-CM | POA: Diagnosis not present

## 2023-04-21 DIAGNOSIS — I739 Peripheral vascular disease, unspecified: Secondary | ICD-10-CM | POA: Diagnosis not present

## 2023-04-21 DIAGNOSIS — I1310 Hypertensive heart and chronic kidney disease without heart failure, with stage 1 through stage 4 chronic kidney disease, or unspecified chronic kidney disease: Secondary | ICD-10-CM | POA: Diagnosis not present

## 2023-04-21 DIAGNOSIS — I6529 Occlusion and stenosis of unspecified carotid artery: Secondary | ICD-10-CM | POA: Diagnosis not present

## 2023-04-21 DIAGNOSIS — M5126 Other intervertebral disc displacement, lumbar region: Secondary | ICD-10-CM | POA: Diagnosis not present

## 2023-04-21 DIAGNOSIS — Z4789 Encounter for other orthopedic aftercare: Secondary | ICD-10-CM | POA: Diagnosis not present

## 2023-04-25 ENCOUNTER — Ambulatory Visit: Payer: Medicare HMO

## 2023-04-25 VITALS — BP 132/74 | HR 76 | Temp 97.9°F | Resp 16 | Ht 64.5 in | Wt 196.8 lb

## 2023-04-25 DIAGNOSIS — I25118 Atherosclerotic heart disease of native coronary artery with other forms of angina pectoris: Secondary | ICD-10-CM | POA: Diagnosis not present

## 2023-04-25 DIAGNOSIS — J45909 Unspecified asthma, uncomplicated: Secondary | ICD-10-CM | POA: Diagnosis not present

## 2023-04-25 DIAGNOSIS — Z4789 Encounter for other orthopedic aftercare: Secondary | ICD-10-CM | POA: Diagnosis not present

## 2023-04-25 DIAGNOSIS — F411 Generalized anxiety disorder: Secondary | ICD-10-CM | POA: Diagnosis not present

## 2023-04-25 DIAGNOSIS — M810 Age-related osteoporosis without current pathological fracture: Secondary | ICD-10-CM

## 2023-04-25 DIAGNOSIS — M5126 Other intervertebral disc displacement, lumbar region: Secondary | ICD-10-CM | POA: Diagnosis not present

## 2023-04-25 DIAGNOSIS — I1310 Hypertensive heart and chronic kidney disease without heart failure, with stage 1 through stage 4 chronic kidney disease, or unspecified chronic kidney disease: Secondary | ICD-10-CM | POA: Diagnosis not present

## 2023-04-25 DIAGNOSIS — N1831 Chronic kidney disease, stage 3a: Secondary | ICD-10-CM | POA: Diagnosis not present

## 2023-04-25 DIAGNOSIS — I6529 Occlusion and stenosis of unspecified carotid artery: Secondary | ICD-10-CM | POA: Diagnosis not present

## 2023-04-25 DIAGNOSIS — I739 Peripheral vascular disease, unspecified: Secondary | ICD-10-CM | POA: Diagnosis not present

## 2023-04-25 MED ORDER — DENOSUMAB 60 MG/ML ~~LOC~~ SOSY
60.0000 mg | PREFILLED_SYRINGE | Freq: Once | SUBCUTANEOUS | Status: AC
  Administered 2023-04-25: 60 mg via SUBCUTANEOUS
  Filled 2023-04-25: qty 1

## 2023-04-25 NOTE — Progress Notes (Signed)
 Diagnosis: Osteoporosis  Provider:  Chilton Greathouse MD  Procedure: Injection  Prolia (Denosumab), Dose: 60 mg, Site: subcutaneous, Number of injections: 1  Injection Site(s): Left arm  Post Care:  left arm injection  Discharge: Condition: Good, Destination: Home . AVS Provided  Performed by:  Rico Ala, LPN

## 2023-04-27 DIAGNOSIS — J45909 Unspecified asthma, uncomplicated: Secondary | ICD-10-CM | POA: Diagnosis not present

## 2023-04-27 DIAGNOSIS — I25118 Atherosclerotic heart disease of native coronary artery with other forms of angina pectoris: Secondary | ICD-10-CM | POA: Diagnosis not present

## 2023-04-27 DIAGNOSIS — I1310 Hypertensive heart and chronic kidney disease without heart failure, with stage 1 through stage 4 chronic kidney disease, or unspecified chronic kidney disease: Secondary | ICD-10-CM | POA: Diagnosis not present

## 2023-04-27 DIAGNOSIS — I739 Peripheral vascular disease, unspecified: Secondary | ICD-10-CM | POA: Diagnosis not present

## 2023-04-27 DIAGNOSIS — F411 Generalized anxiety disorder: Secondary | ICD-10-CM | POA: Diagnosis not present

## 2023-04-27 DIAGNOSIS — I6529 Occlusion and stenosis of unspecified carotid artery: Secondary | ICD-10-CM | POA: Diagnosis not present

## 2023-04-27 DIAGNOSIS — Z4789 Encounter for other orthopedic aftercare: Secondary | ICD-10-CM | POA: Diagnosis not present

## 2023-04-27 DIAGNOSIS — N1831 Chronic kidney disease, stage 3a: Secondary | ICD-10-CM | POA: Diagnosis not present

## 2023-04-27 DIAGNOSIS — M5126 Other intervertebral disc displacement, lumbar region: Secondary | ICD-10-CM | POA: Diagnosis not present

## 2023-05-02 ENCOUNTER — Telehealth: Payer: Self-pay | Admitting: *Deleted

## 2023-05-02 DIAGNOSIS — N1831 Chronic kidney disease, stage 3a: Secondary | ICD-10-CM | POA: Diagnosis not present

## 2023-05-02 DIAGNOSIS — I25118 Atherosclerotic heart disease of native coronary artery with other forms of angina pectoris: Secondary | ICD-10-CM | POA: Diagnosis not present

## 2023-05-02 DIAGNOSIS — I1310 Hypertensive heart and chronic kidney disease without heart failure, with stage 1 through stage 4 chronic kidney disease, or unspecified chronic kidney disease: Secondary | ICD-10-CM | POA: Diagnosis not present

## 2023-05-02 DIAGNOSIS — I739 Peripheral vascular disease, unspecified: Secondary | ICD-10-CM | POA: Diagnosis not present

## 2023-05-02 DIAGNOSIS — I6529 Occlusion and stenosis of unspecified carotid artery: Secondary | ICD-10-CM | POA: Diagnosis not present

## 2023-05-02 DIAGNOSIS — F411 Generalized anxiety disorder: Secondary | ICD-10-CM | POA: Diagnosis not present

## 2023-05-02 DIAGNOSIS — Z4789 Encounter for other orthopedic aftercare: Secondary | ICD-10-CM | POA: Diagnosis not present

## 2023-05-02 DIAGNOSIS — J45909 Unspecified asthma, uncomplicated: Secondary | ICD-10-CM | POA: Diagnosis not present

## 2023-05-02 DIAGNOSIS — M5126 Other intervertebral disc displacement, lumbar region: Secondary | ICD-10-CM | POA: Diagnosis not present

## 2023-05-02 NOTE — Progress Notes (Signed)
 Complex Care Management Care Guide Note  05/02/2023 Name: Kathryn [name] MRN: [id number] DOB: 06/17/1940  Kathryn [name] is a 83 y.o. year old female who is a primary care patient of [name], MD and is actively engaged with the care management team. I reached out to Kathryn [name] by phone today to assist with returning a call from a voicemail.  Follow up plan: Unsuccessful telephone outreach attempt made.   Gwenevere Ghazi  [facility]  [facility], [facility] Guide  Direct Dial: [phone]  Fax [phone]

## 2023-05-10 DIAGNOSIS — N1831 Chronic kidney disease, stage 3a: Secondary | ICD-10-CM | POA: Diagnosis not present

## 2023-05-10 DIAGNOSIS — I1310 Hypertensive heart and chronic kidney disease without heart failure, with stage 1 through stage 4 chronic kidney disease, or unspecified chronic kidney disease: Secondary | ICD-10-CM | POA: Diagnosis not present

## 2023-05-10 DIAGNOSIS — J45909 Unspecified asthma, uncomplicated: Secondary | ICD-10-CM | POA: Diagnosis not present

## 2023-05-10 DIAGNOSIS — I739 Peripheral vascular disease, unspecified: Secondary | ICD-10-CM | POA: Diagnosis not present

## 2023-05-10 DIAGNOSIS — I6529 Occlusion and stenosis of unspecified carotid artery: Secondary | ICD-10-CM | POA: Diagnosis not present

## 2023-05-10 DIAGNOSIS — I25118 Atherosclerotic heart disease of native coronary artery with other forms of angina pectoris: Secondary | ICD-10-CM | POA: Diagnosis not present

## 2023-05-10 DIAGNOSIS — M5126 Other intervertebral disc displacement, lumbar region: Secondary | ICD-10-CM | POA: Diagnosis not present

## 2023-05-10 DIAGNOSIS — F411 Generalized anxiety disorder: Secondary | ICD-10-CM | POA: Diagnosis not present

## 2023-05-10 DIAGNOSIS — Z4789 Encounter for other orthopedic aftercare: Secondary | ICD-10-CM | POA: Diagnosis not present

## 2023-05-12 NOTE — Progress Notes (Signed)
 Closing outreach patient has appointment to follow up with [facility]

## 2023-05-18 ENCOUNTER — Other Ambulatory Visit: Payer: Self-pay | Admitting: Internal Medicine

## 2023-05-18 DIAGNOSIS — F419 Anxiety disorder, unspecified: Secondary | ICD-10-CM

## 2023-05-18 NOTE — Telephone Encounter (Signed)
 Copied from CRM [id number]. Topic: Clinical - Medication Refill >> May 18, 2023  4:49 PM Fuller Mandril wrote: Most Recent Primary Care Visit:  Provider: [name]  Department: [facility]  Visit Type: MYCHART VIDEO VISIT  Date: 03/21/2023  Medication: sertraline (ZOLOFT) 25 MG tablet  Has the patient contacted their pharmacy? Yes (Agent: If no, request that the patient contact the pharmacy for the refill. If patient does not wish to contact the pharmacy document the reason why and proceed with request.) (Agent: If yes, when and what did the pharmacy advise?) requested two weeks ago with pharmacy has not received a response  Is this the correct pharmacy for this prescription? Yes If no, delete pharmacy and type the correct one.  This is the patient's preferred pharmacy:  [facility] [place], [place] - [address] [place] [address] Phone: [phone] Fax: [phone]   Has the prescription been filled recently? No  Is the patient out of the medication? Yes - out for over a week   Has the patient been seen for an appointment in the last year OR does the patient have an upcoming appointment? Yes  Can we respond through MyChart? No  Agent: Please be advised that Rx refills may take up to 3 business days. We ask that you follow-up with your pharmacy.

## 2023-05-19 ENCOUNTER — Other Ambulatory Visit: Payer: Self-pay

## 2023-05-19 NOTE — Progress Notes (Signed)
 Disenrolling - Prolia last administered 3.11.25 at infusion center and supplied through medical benefit for patient affordability.

## 2023-05-22 ENCOUNTER — Ambulatory Visit: Payer: Self-pay

## 2023-05-22 NOTE — Progress Notes (Unsigned)
 This encounter was created in error - please disregard.

## 2023-05-22 NOTE — Addendum Note (Signed)
 Addended by: Riley Churches on: 05/22/2023 01:33 PM   Modules accepted: Orders, Level of Service

## 2023-05-22 NOTE — Progress Notes (Signed)
 This encounter was created in error - please disregard.

## 2023-05-23 MED ORDER — SERTRALINE HCL 25 MG PO TABS: 25.0000 mg | ORAL_TABLET | Freq: Every day | ORAL | 2 refills | Status: AC

## 2023-05-31 ENCOUNTER — Encounter: Payer: Self-pay | Admitting: Licensed Clinical Social Worker

## 2023-06-13 ENCOUNTER — Other Ambulatory Visit: Payer: Self-pay | Admitting: Internal Medicine

## 2023-06-13 NOTE — Telephone Encounter (Signed)
 Copied from CRM [id number]. Topic: Clinical - Medication Refill >> Jun 13, 2023 10:36 AM Donald Frost wrote: Most Recent Primary Care Visit:  Provider: Cleave Curling  Department: [facility]  Visit Type: MYCHART VIDEO VISIT  Date: 03/21/2023  Medication: ezetimibe  (ZETIA ) 10 MG tablet  Has the patient contacted their pharmacy? Yes   Is this the correct pharmacy for this prescription? Yes If no, delete pharmacy and type the correct one.  This is the patient's preferred pharmacy:  [facility] [place], [place] - [address] [place] [address] Phone: [phone] Fax: [phone]   Has the prescription been filled recently? No  Is the patient out of the medication? No but she only has a few pills left  Has the patient been seen for an appointment in the last year OR does the patient have an upcoming appointment? Yes  Can we respond through MyChart? Yes  Please assist patient further

## 2023-06-14 ENCOUNTER — Ambulatory Visit: Payer: Self-pay

## 2023-06-14 VITALS — BP 106/60 | HR 63 | Temp 97.6°F | Ht 63.0 in | Wt 195.0 lb

## 2023-06-14 DIAGNOSIS — Z0000 Encounter for general adult medical examination without abnormal findings: Secondary | ICD-10-CM

## 2023-06-14 NOTE — Progress Notes (Signed)
 sand  Subjective:   Kathryn [name] is a 83 y.o. who presents for a Medicare Wellness preventive visit.  Visit Complete: In person    Persons Participating in Visit: Patient.  AWV Questionnaire: No: Patient Medicare AWV questionnaire was not completed prior to this visit.  Cardiac Risk Factors include: advanced age (>51men, >76 women);dyslipidemia;hypertension     Objective:    Today's Vitals   06/14/23 1145 06/14/23 1146  BP: 106/60   Pulse: 63   Temp: 97.6 F (36.4 C)   TempSrc: Oral   Weight: 195 lb (88.5 kg)   Height: 5\' 3"  (1.6 m)   PainSc:  8    Body mass index is 34.54 kg/m.     06/14/2023   11:55 AM 04/25/2023    3:51 PM 02/19/2023    8:00 AM 02/18/2023    4:13 PM 06/09/2022   11:31 AM 06/02/2021   11:04 AM 04/27/2021    1:51 PM  Advanced Directives  Does Patient Have a Medical Advance Directive? Yes Yes No No Yes Yes Yes  Type of Estate agent of [place];Living will Healthcare Power of [place];Living will   Healthcare Power of [place];Living will Healthcare Power of [place];Living will Living will;Healthcare Power of Attorney  Copy of Healthcare Power of Attorney in Chart? No - copy requested    No - copy requested No - copy requested No - copy requested  Would patient like information on creating a medical advance directive?   No - Patient declined No - Patient declined       Current Medications (verified) Outpatient Encounter Medications as of 06/14/2023  Medication Sig   acetaminophen  (TYLENOL ) 325 MG tablet Take 325 mg by mouth every 6 (six) hours as needed for mild pain.   ALPRAZolam  (XANAX ) 0.25 MG tablet Take 1 tablet (0.25 mg total) by mouth every evening. (Patient taking differently: Take 0.25 mg by mouth as needed.)   amLODipine  (NORVASC ) 10 MG tablet Take 1 tablet (10 mg total) by mouth daily.   Apoaequorin (PREVAGEN PO) Take 1 tablet by mouth daily.   Aspirin  81 MG CAPS    benzonatate  (TESSALON  PERLES) 100 MG capsule Take  1 capsule (100 mg total) by mouth 3 (three) times daily as needed for cough.   CALCIUM  PO Take 1 tablet by mouth daily. 600 mg   carvedilol  (COREG ) 12.5 MG tablet TAKE (1) TABLET BY MOUTH TWICE DAILY.   cetirizine (ZYRTEC) 10 MG tablet Take 10 mg by mouth at bedtime.   denosumab  (PROLIA ) 60 MG/ML SOSY injection Inject 60 mg into the skin every 6 (six) months. 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[address], [address], [place] [address].Appt on 08/03/22   dexamethasone  (DECADRON ) 2 MG tablet Take 2 mg by mouth 2 (two) times daily.   diclofenac  Sodium (VOLTAREN ) 1 % GEL Apply 4 g topically 4 (four) times daily.   ezetimibe  (ZETIA ) 10 MG tablet TAKE 1 TABLET BY MOUTH ONCE A DAY.   famotidine  (PEPCID ) 20 MG tablet TAKE ONE TABLET BY MOUTH ONCE DAILY.   fluticasone  (FLONASE ) 50 MCG/ACT nasal spray Place 2 sprays into both nostrils daily.   losartan  (COZAAR ) 25 MG tablet Take 1 tablet (25 mg total) by mouth daily.   methocarbamol (ROBAXIN) 500 MG tablet Take 500 mg by mouth 2 (two) times daily.   Multiple Vitamin (MULTIVITAMIN WITH MINERALS) TABS Take 1 tablet by mouth daily.   nitroGLYCERIN  (NITROSTAT ) 0.4 MG SL tablet Place 1 tablet (0.4 mg total) under the tongue every 5 (five)  minutes x 3 doses as needed for chest pain.   ondansetron  (ZOFRAN -ODT) 4 MG disintegrating tablet Take 4 mg by mouth every 6 (six) hours as needed.   polyethylene glycol (MIRALAX  / GLYCOLAX ) 17 g packet Take 17 g by mouth daily. (Patient taking differently: Take 17 g by mouth as needed.)   pregabalin  (LYRICA ) 75 MG capsule TAKE 1 CAPSULE BY MOUTH THREE TIMES A DAY. (Patient taking differently: as needed. TAKE 1 CAPSULE BY MOUTH THREE TIMES A DAY.)   Probiotic Product (PROBIOTIC DAILY PO) Take 1 tablet by mouth daily at 12 noon.   sertraline  (ZOLOFT ) 25 MG tablet Take 1 tablet (25 mg total) by mouth daily.   tiZANidine  (ZANAFLEX ) 4 MG tablet TAKE 1 TABLET BY MOUTH ONCE AT BEDTIME AS NEEDED FOR MUSCLE SPASMS.   traMADol  (ULTRAM ) 50 MG  tablet Take 1 tablet (50 mg total) by mouth every 6 (six) hours as needed for severe pain (pain score 7-10).   triamcinolone  cream (KENALOG ) 0.1 % Apply 1 Application topically 2 (two) times daily. (Patient taking differently: Apply 1 Application topically as needed.)   pravastatin  (PRAVACHOL ) 40 MG tablet Take 1 tablet (40 mg total) by mouth every evening.   No facility-administered encounter medications on file as of 06/14/2023.    Allergies (verified) Codeine and Ibuprofen   History: Past Medical History:  Diagnosis Date   Anxiety    takes Xanax  bid prn   Arthritis    Bruises easily    pt is on Plavix    Chronic back pain    buldging disc   Chronic neck pain    Coronary artery disease    1 stent    Dysphagia    Headache(784.0)    related to cervical issues   History of colonic polyps    Hyperlipidemia    takes Crestor  daily   Hypertension    takes Coreg  and Diovan  daily   Myocardial infarction (HCC) 2019   Osteoporosis    was taking shot 2xyr;but not taking anymore   Peripheral vascular disease (HCC)    Sciatica    Seasonal allergies    takes Zyrtec nightly   Past Surgical History:  Procedure Laterality Date   ABDOMINAL HYSTERECTOMY  70's   BUNIONECTOMY     left foot   CARDIAC CATHETERIZATION  2009/2011   1 stent placed   CATARACT EXTRACTION Bilateral    CHOLECYSTECTOMY  2009   COLONOSCOPY     ESOPHAGOGASTRODUODENOSCOPY     LEFT HEART CATH AND CORONARY ANGIOGRAPHY N/A 01/16/2018   Procedure: LEFT HEART CATH AND CORONARY ANGIOGRAPHY;  Surgeon: Cody Das, MD;  Location: [facility];  Service: Cardiovascular;  Laterality: N/A;   LUMBAR LAMINECTOMY/DECOMPRESSION MICRODISCECTOMY N/A 02/20/2023   Procedure: LUMBAR LAMINECTOMY/DECOMPRESSION MICRODISCECTOMY 1 LEVEL L4-L5;  Surgeon: Augusto Blonder, MD;  Location: [facility] OR;  Service: Neurosurgery;  Laterality: N/A;   TONSILLECTOMY     at age 71   Family History  Problem Relation Age of Onset   Heart  disease Mother        Heart Dissease before age 55   Heart attack Mother    Cancer Father    Leukemia Sister    Cancer Sister    Lupus Sister    Cancer Brother    Heart disease Brother        Amputation   Heart attack Brother    Heart disease Brother    Heart attack Brother    Dementia Other    Healthy Son  Healthy Daughter    Healthy Daughter    Healthy Daughter    Anesthesia problems Neg Hx    Hypotension Neg Hx    Malignant hyperthermia Neg Hx    Pseudochol deficiency Neg Hx    Social History   Socioeconomic History   Marital status: Widowed    Spouse name: Not on file   Number of children: 4   Years of education: Not on file   Highest education level: Not on file  Occupational History   Occupation: retired  Tobacco Use   Smoking status: Former    Current packs/day: 0.00    Average packs/day: 0.3 packs/day for 5.0 years (1.3 ttl pk-yrs)    Types: Cigarettes    Start date: 11/28/1963    Quit date: 11/27/1968    Years since quitting: 54.5    Passive exposure: Never   Smokeless tobacco: Never  Vaping Use   Vaping status: Never Used  Substance and Sexual Activity   Alcohol use: No   Drug use: No   Sexual activity: Not Currently  Other Topics Concern   Not on file  Social History Narrative   Not on file   Social Drivers of Health   Financial Resource Strain: Low Risk  (06/14/2023)   Overall Financial Resource Strain (CARDIA)    Difficulty of Paying Living Expenses: Not hard at all  Food Insecurity: No Food Insecurity (06/14/2023)   Hunger Vital Sign    Worried About Running Out of Food in the Last Year: Never true    Ran Out of Food in the Last Year: Never true  Transportation Needs: No Transportation Needs (06/14/2023)   PRAPARE - Administrator, Civil Service (Medical): No    Lack of Transportation (Non-Medical): No  Physical Activity: Inactive (06/14/2023)   Exercise Vital Sign    Days of Exercise per Week: 0 days    Minutes of Exercise  per Session: 0 min  Stress: No Stress Concern Present (06/14/2023)   [employer] of Occupational Health - Occupational Stress Questionnaire    Feeling of Stress : Only a little  Social Connections: Moderately Integrated (06/14/2023)   Social Connection and Isolation Panel [NHANES]    Frequency of Communication with Friends and Family: More than three times a week    Frequency of Social Gatherings with Friends and Family: Twice a week    Attends Religious Services: More than 4 times per year    Active Member of [employer] or Organizations: Yes    Attends Banker Meetings: More than 4 times per year    Marital Status: Widowed    Tobacco Counseling Counseling given: Not Answered    Clinical Intake:  Pre-visit preparation completed: Yes  Pain : 0-10 Pain Score: 8  Pain Type: Chronic pain Pain Location: Arm Pain Orientation: Right Pain Descriptors / Indicators: Aching Pain Onset: More than a month ago Pain Frequency: Constant     Nutritional Status: BMI > 30  Obese Nutritional Risks: Nausea/ vomitting/ diarrhea (diarrhea last week, resolved) Diabetes: No  Lab Results  Component Value Date   HGBA1C 5.9 (H) 12/12/2022   HGBA1C 5.7 (H) 03/23/2022   HGBA1C 5.6 09/13/2021     How often do you need to have someone help you when you read instructions, pamphlets, or other written materials from your doctor or pharmacy?: 1 - Never  Interpreter Needed?: No  Information entered by :: NAllen LPN   Activities of Daily Living     06/14/2023  11:49 AM 02/19/2023    8:00 AM  In your present state of health, do you have any difficulty performing the following activities:  Hearing? 0 0  Vision? 0 0  Difficulty concentrating or making decisions? 0 0  Walking or climbing stairs? 1   Dressing or bathing? 0   Doing errands, shopping? 0 0  Preparing Food and eating ? N   Using the Toilet? N   In the past six months, have you accidently leaked urine? N   Do you have  problems with loss of bowel control? N   Managing your Medications? N   Managing your Finances? N   Housekeeping or managing your Housekeeping? N     Patient Care Team: Cleave Curling, MD as PCP - General (Internal Medicine) Knox Perl, MD as Attending Physician (Cardiology) Nathen Balder, Skeeter Dukes, RN as VBCI Care Management Lewis, Jasmine D, LCSW as VBCI Care Management (Licensed Clinical Social Worker) Little, Skeeter Dukes, RN  Indicate any recent Medical Services you may have received from other than [facility] providers in the past year (date may be approximate).     Assessment:   This is a routine wellness examination for Kathryn [name].  Hearing/Vision screen Hearing Screening - Comments:: Denies hearing issues Vision Screening - Comments:: Regular eye exams, [facility]   Goals Addressed             This Visit's Progress    Patient Stated       06/14/2023, wants to eat healthy       Depression Screen     06/14/2023   11:57 AM 04/25/2023    3:50 PM 12/12/2022   12:26 PM 06/09/2022   11:32 AM 03/23/2022   11:03 AM 08/26/2021    2:34 PM 06/02/2021   11:05 AM  PHQ 2/9 Scores  PHQ - 2 Score 1 0 0 0 0 0 0  PHQ- 9 Score 2  0        Fall Risk     06/14/2023   11:56 AM 04/25/2023    3:50 PM 06/09/2022   11:31 AM 03/23/2022   11:03 AM 08/26/2021    2:33 PM  Fall Risk   Falls in the past year? 0 0 1 1 0  Comment   tripped    Number falls in past yr: 0  0 0 0  Injury with Fall? 0 0 0 0 0  Risk for fall due to : Impaired mobility;Impaired balance/gait;Medication side effect  Impaired mobility;Impaired balance/gait;Medication side effect History of fall(s)   Follow up Falls prevention discussed;Falls evaluation completed  Falls prevention discussed;Education provided;Falls evaluation completed Falls evaluation completed Falls evaluation completed    MEDICARE RISK AT HOME:  Medicare Risk at Home Any stairs in or around the home?: No If so, are there any without handrails?: No Home free  of loose throw rugs in walkways, pet beds, electrical cords, etc?: Yes Adequate lighting in your home to reduce risk of falls?: Yes Life alert?: No Use of a cane, walker or w/c?: Yes Grab bars in the bathroom?: Yes Shower chair or bench in shower?: Yes Elevated toilet seat or a handicapped toilet?: Yes  TIMED UP AND GO:  Was the test performed?  Yes  Length of time to ambulate 10 feet: 7 sec Gait slow and steady with assistive device  Cognitive Function: 6CIT completed        06/14/2023   11:59 AM 06/09/2022   11:33 AM 06/02/2021   11:08 AM 05/28/2020  10:49 AM 05/23/2019    2:23 PM  6CIT Screen  What Year? 0 points 0 points 0 points 0 points 0 points  What month? 0 points 0 points 0 points 0 points 3 points  What time? 0 points 3 points 0 points 0 points 0 points  Count back from 20 0 points 2 points 0 points 0 points 0 points  Months in reverse 0 points 0 points 0 points 2 points 0 points  Repeat phrase 6 points 4 points 4 points 10 points 2 points  Total Score 6 points 9 points 4 points 12 points 5 points    Immunizations Immunization History  Administered Date(s) Administered   DTaP 10/25/2012   Fluad Quad(high Dose 65+) 12/03/2019, 11/19/2020, 03/23/2022   Influenza, High Dose Seasonal PF 12/21/2017, 11/15/2018   Moderna Covid-19 Fall Seasonal Vaccine 82yrs & older 12/03/2021   Moderna Covid-19 Vaccine Bivalent Booster 54yrs & up 11/27/2020   PFIZER(Purple Top)SARS-COV-2 Vaccination 03/01/2019, 03/22/2019, 11/13/2019, 06/09/2020   Pneumococcal Conjugate-13 07/15/2013   Pneumococcal Polysaccharide-23 03/12/2012   Tdap 12/12/2022   Zoster Recombinant(Shingrix) 11/01/2018, 02/06/2019    Screening Tests Health Maintenance  Topic Date Due   COVID-19 Vaccine (7 - 2024-25 season) 10/16/2022   INFLUENZA VACCINE  09/15/2023   Medicare Annual Wellness (AWV)  06/13/2024   DTaP/Tdap/Td (3 - Td or Tdap) 12/11/2032   Pneumonia Vaccine 31+ Years old  Completed   DEXA SCAN   Completed   Zoster Vaccines- Shingrix  Completed   HPV VACCINES  Aged Out   Meningococcal B Vaccine  Aged Out    Health Maintenance  Health Maintenance Due  Topic Date Due   COVID-19 Vaccine (7 - 2024-25 season) 10/16/2022   Health Maintenance Items Addressed: Due for covid vaccine.  Additional Screening:  Vision Screening: Recommended annual ophthalmology exams for early detection of glaucoma and other disorders of the eye.  Dental Screening: Recommended annual dental exams for proper oral hygiene  Community Resource Referral / Chronic Care Management: CRR required this visit?  No   CCM required this visit?  No     Plan:     I have personally reviewed and noted the following in the patient's chart:   Medical and social history Use of alcohol, tobacco or illicit drugs  Current medications and supplements including opioid prescriptions. Patient is not currently taking opioid prescriptions. Functional ability and status Nutritional status Physical activity Advanced directives List of other physicians Hospitalizations, surgeries, and ER visits in previous 12 months Vitals Screenings to include cognitive, depression, and falls Referrals and appointments  In addition, I have reviewed and discussed with patient certain preventive protocols, quality metrics, and best practice recommendations. A written personalized care plan for preventive services as well as general preventive health recommendations were provided to patient.     Areatha Beecham, LPN   1/61/0960   After Visit Summary: (In Person-Printed) AVS printed and given to the patient  Notes: Nothing significant to report at this time.

## 2023-06-14 NOTE — Patient Instructions (Signed)
 Ms. Sigur , Thank you for taking time to come for your Medicare Wellness Visit. I appreciate your ongoing commitment to your health goals. Please review the following plan we discussed and let me know if I can assist you in the future.   Referrals/Orders/Follow-Ups/Clinician Recommendations: none  This is a list of the screening recommended for you and due dates:  Health Maintenance  Topic Date Due   COVID-19 Vaccine (7 - 2024-25 season) 06/30/2023*   Flu Shot  09/15/2023   Medicare Annual Wellness Visit  06/13/2024   DTaP/Tdap/Td vaccine (3 - Td or Tdap) 12/11/2032   Pneumonia Vaccine  Completed   DEXA scan (bone density measurement)  Completed   Zoster (Shingles) Vaccine  Completed   HPV Vaccine  Aged Out   Meningitis B Vaccine  Aged Out  *Topic was postponed. The date shown is not the original due date.    Advanced directives: (Copy Requested) Please bring a copy of your health care power of attorney and living will to the office to be added to your chart at your convenience. You can mail to [facility] [address] [place], [place] [address] or email to [email or web address]  Next Medicare Annual Wellness Visit scheduled for next year: No, office will schedule   insert Preventive Care attachment Insert FALL PREVENTION attachment if needed

## 2023-06-15 MED ORDER — EZETIMIBE 10 MG PO TABS: 10.0000 mg | ORAL_TABLET | Freq: Every day | ORAL | 3 refills | Status: AC

## 2023-06-19 DIAGNOSIS — M5126 Other intervertebral disc displacement, lumbar region: Secondary | ICD-10-CM | POA: Diagnosis not present

## 2023-06-21 ENCOUNTER — Encounter (HOSPITAL_COMMUNITY): Payer: Self-pay

## 2023-06-22 ENCOUNTER — Ambulatory Visit: Payer: Self-pay

## 2023-06-22 NOTE — Telephone Encounter (Signed)
 Copied from CRM [id number]. Topic: Clinical - Red Word Triage >> Jun 22, 2023  4:41 PM Donald Frost wrote: Red Word that prompted transfer to Nurse Triage: The patient called in stating she has had abdominal pain, nausea and diarrhea since yesterday. I will transfer her to E2C2 NT   Chief Complaint: Abdominal pain Symptoms: Abdominal pain, nausea, diarrhea  Frequency: Intermittent  Pertinent Negatives: Patient denies vomiting  Disposition: [] ED /[x] Urgent Care (no appt availability in office) / [] Appointment(In office/virtual)/ []  [facility]/ [] Home Care/ [] Refused Recommended Disposition /[] [facility]/ []  Follow-up with PCP Additional Notes: Patient reports that yesterday she began to experience lower abdominal pain. She states her pain has been moderate and is intermittent. She states she has also been experiencing nausea and diarrhea, but has not had any vomiting. Patient advised that there are no appointments available until Monday and that she should go to urgent care for evaluation and treatment. Patient verbalized understanding and agreement with this plan.     Reason for Disposition  [1] MODERATE pain (e.g., interferes with normal activities) AND [2] pain comes and goes (cramps) AND [3] present > 24 hours  (Exception: Pain with Vomiting or Diarrhea - see that Guideline.)  Answer Assessment - Initial Assessment Questions 1. LOCATION: "Where does it hurt?"      Lower abdomen  2. RADIATION: "Does the pain shoot anywhere else?" (e.g., chest, back)     No radiation  3. ONSET: "When did the pain begin?" (e.g., minutes, hours or days ago)      Yesterday  4. SUDDEN: "Gradual or sudden onset?"     Gradual  5. PATTERN "Does the pain come and go, or is it constant?"    - If it comes and goes: "How long does it last?" "Do you have pain now?"     (Note: Comes and goes means the pain is intermittent. It goes away completely between bouts.)    - If constant: "Is it getting  better, staying the same, or getting worse?"      (Note: Constant means the pain never goes away completely; most serious pain is constant and gets worse.)      Intermittent   6. SEVERITY: "How bad is the pain?"  (e.g., Scale 1-10; mild, moderate, or severe)    - MILD (1-3): Doesn't interfere with normal activities, abdomen soft and not tender to touch.     - MODERATE (4-7): Interferes with normal activities or awakens from sleep, abdomen tender to touch.     - SEVERE (8-10): Excruciating pain, doubled over, unable to do any normal activities.       8/10 7. RECURRENT SYMPTOM: "Have you ever had this type of stomach pain before?" If Yes, ask: "When was the last time?" and "What happened that time?"      Had similar pain last week  8. CAUSE: "What do you think is causing the stomach pain?"     Unsure  9. RELIEVING/AGGRAVATING FACTORS: "What makes it better or worse?" (e.g., antacids, bending or twisting motion, bowel movement)     No 10. OTHER SYMPTOMS: "Do you have any other symptoms?" (e.g., back pain, diarrhea, fever, urination pain, vomiting)       Diarrhea, nausea  Protocols used: Abdominal Pain - Female-A-AH

## 2023-06-26 ENCOUNTER — Telehealth: Payer: Self-pay | Admitting: *Deleted

## 2023-06-26 NOTE — Progress Notes (Signed)
 Complex Care Management Care Guide Note  06/26/2023 Name: ARTIE CANEPA MRN: [id number] DOB: 12/31/1940  Neoma Banker is a 83 y.o. year old female who is a primary care patient of Cleave Curling, MD and is actively engaged with the care management team. I reached out to Neoma Banker by phone today to assist with re-scheduling  with the RN Case Manager Licensed Clinical Social Worker.  Follow up plan: Unsuccessful telephone outreach attempt made.   Barnie Bora  [facility]  [facility], [facility] Guide  Direct Dial: [phone]  Fax [phone]

## 2023-06-28 ENCOUNTER — Encounter (HOSPITAL_COMMUNITY): Payer: Self-pay

## 2023-06-28 ENCOUNTER — Ambulatory Visit (HOSPITAL_COMMUNITY)
Admission: RE | Admit: 2023-06-28 | Discharge: 2023-06-28 | Disposition: A | Discharge status: Home / Self Care | Source: Ambulatory Visit | Attending: Internal Medicine | Admitting: Internal Medicine

## 2023-06-28 DIAGNOSIS — Z1231 Encounter for screening mammogram for malignant neoplasm of breast: Secondary | ICD-10-CM | POA: Diagnosis not present

## 2023-06-29 ENCOUNTER — Other Ambulatory Visit (HOSPITAL_COMMUNITY): Payer: Self-pay | Admitting: Gastroenterology

## 2023-06-29 DIAGNOSIS — K5730 Diverticulosis of large intestine without perforation or abscess without bleeding: Secondary | ICD-10-CM | POA: Diagnosis not present

## 2023-06-29 DIAGNOSIS — Z800 Family history of malignant neoplasm of digestive organs: Secondary | ICD-10-CM | POA: Diagnosis not present

## 2023-06-29 DIAGNOSIS — R933 Abnormal findings on diagnostic imaging of other parts of digestive tract: Secondary | ICD-10-CM | POA: Diagnosis not present

## 2023-06-29 DIAGNOSIS — K862 Cyst of pancreas: Secondary | ICD-10-CM

## 2023-06-29 DIAGNOSIS — Z860100 Personal history of colon polyps, unspecified: Secondary | ICD-10-CM | POA: Diagnosis not present

## 2023-07-03 ENCOUNTER — Other Ambulatory Visit (HOSPITAL_COMMUNITY): Payer: Self-pay | Admitting: Internal Medicine

## 2023-07-03 DIAGNOSIS — R928 Other abnormal and inconclusive findings on diagnostic imaging of breast: Secondary | ICD-10-CM

## 2023-07-04 ENCOUNTER — Ambulatory Visit (INDEPENDENT_AMBULATORY_CARE_PROVIDER_SITE_OTHER): Payer: Medicare Other | Admitting: Internal Medicine

## 2023-07-04 ENCOUNTER — Encounter: Payer: Self-pay | Admitting: Internal Medicine

## 2023-07-04 VITALS — BP 110/74 | Temp 98.1°F | Ht 63.0 in | Wt 193.4 lb

## 2023-07-04 DIAGNOSIS — F4323 Adjustment disorder with mixed anxiety and depressed mood: Secondary | ICD-10-CM | POA: Diagnosis not present

## 2023-07-04 DIAGNOSIS — G8929 Other chronic pain: Secondary | ICD-10-CM | POA: Diagnosis not present

## 2023-07-04 DIAGNOSIS — K30 Functional dyspepsia: Secondary | ICD-10-CM | POA: Diagnosis not present

## 2023-07-04 DIAGNOSIS — E6609 Other obesity due to excess calories: Secondary | ICD-10-CM

## 2023-07-04 DIAGNOSIS — I1310 Hypertensive heart and chronic kidney disease without heart failure, with stage 1 through stage 4 chronic kidney disease, or unspecified chronic kidney disease: Secondary | ICD-10-CM | POA: Diagnosis not present

## 2023-07-04 DIAGNOSIS — M25511 Pain in right shoulder: Secondary | ICD-10-CM

## 2023-07-04 DIAGNOSIS — K862 Cyst of pancreas: Secondary | ICD-10-CM

## 2023-07-04 DIAGNOSIS — Z0000 Encounter for general adult medical examination without abnormal findings: Secondary | ICD-10-CM | POA: Diagnosis not present

## 2023-07-04 DIAGNOSIS — F419 Anxiety disorder, unspecified: Secondary | ICD-10-CM

## 2023-07-04 DIAGNOSIS — N1831 Chronic kidney disease, stage 3a: Secondary | ICD-10-CM | POA: Diagnosis not present

## 2023-07-04 DIAGNOSIS — D509 Iron deficiency anemia, unspecified: Secondary | ICD-10-CM

## 2023-07-04 DIAGNOSIS — E66811 Obesity, class 1: Secondary | ICD-10-CM

## 2023-07-04 DIAGNOSIS — I25118 Atherosclerotic heart disease of native coronary artery with other forms of angina pectoris: Secondary | ICD-10-CM | POA: Diagnosis not present

## 2023-07-04 DIAGNOSIS — Z6834 Body mass index (BMI) 34.0-34.9, adult: Secondary | ICD-10-CM

## 2023-07-04 DIAGNOSIS — E7800 Pure hypercholesterolemia, unspecified: Secondary | ICD-10-CM

## 2023-07-04 DIAGNOSIS — R7303 Prediabetes: Secondary | ICD-10-CM

## 2023-07-04 LAB — POCT URINALYSIS DIPSTICK
Bilirubin, UA: NEGATIVE
Blood, UA: NEGATIVE
Glucose, UA: NEGATIVE
Ketones, UA: NEGATIVE
Leukocytes, UA: NEGATIVE
Nitrite, UA: NEGATIVE
Protein, UA: POSITIVE — AB
Spec Grav, UA: >=1.03 — AB (ref 1.010–1.025)
Urobilinogen, UA: 0.2 U/dL
pH, UA: 6 (ref 5.0–8.0)

## 2023-07-04 MED ORDER — FAMOTIDINE 20 MG PO TABS: 20.0000 mg | ORAL_TABLET | Freq: Every day | ORAL | 1 refills | Status: DC

## 2023-07-04 NOTE — Patient Outreach (Signed)
 Unable to reach patient for collaboration to assess for goal outcomes. Patient closed from complex case management.   Louanne Roussel RN BSN CCM [place]  [facility], [facility] Nurse Care Coordinator  Direct Dial: [phone] Website: [email or web address]

## 2023-07-04 NOTE — Progress Notes (Unsigned)
 I,Victoria T Basil Lim, CMA,acting as a Neurosurgeon for Smiley Dung, MD.,have documented all relevant documentation on the behalf of Smiley Dung, MD,as directed by  Smiley Dung, MD while in the presence of Smiley Dung, MD.  Subjective:    Patient ID: Kathryn [name] , female    DOB: 1941-01-25 , 83 y.o.   MRN: [id number]  Chief Complaint  Patient presents with  . Annual Exam    Patient present today for annual exam. She reports compliance with medications. Denies headache, chest pain & sob. She states recently having stomach issues. Experiencing diarrhea & nausea. She takes Pepto Bismol OTC, she admits this helps.   . Hypertension  . Prediabetes  . Hyperlipidemia    HPI  HPI   Past Medical History:  Diagnosis Date  . Anxiety    takes Xanax  bid prn  . Arthritis   . Bruises easily    pt is on Plavix   . Chronic back pain    buldging disc  . Chronic neck pain   . Coronary artery disease    1 stent   . Dysphagia   . Headache(784.0)    related to cervical issues  . History of colonic polyps   . Hyperlipidemia    takes Crestor  daily  . Hypertension    takes Coreg  and Diovan  daily  . Myocardial infarction (HCC) 2019  . Osteoporosis    was taking shot 2xyr;but not taking anymore  . Peripheral vascular disease (HCC)   . Sciatica   . Seasonal allergies    takes Zyrtec nightly     Family History  Problem Relation Age of Onset  . Heart disease Mother        Heart Dissease before age 67  . Heart attack Mother   . Cancer Father   . Leukemia Sister   . Cancer Sister   . Lupus Sister   . Cancer Brother   . Heart disease Brother        Amputation  . Heart attack Brother   . Heart disease Brother   . Heart attack Brother   . Dementia Other   . Healthy Son   . Healthy Daughter   . Healthy Daughter   . Healthy Daughter   . Anesthesia problems Neg Hx   . Hypotension Neg Hx   . Malignant hyperthermia Neg Hx   . Pseudochol deficiency Neg Hx      Current  Outpatient Medications:  .  acetaminophen  (TYLENOL ) 325 MG tablet, Take 325 mg by mouth every 6 (six) hours as needed for mild pain., Disp: , Rfl:  .  ALPRAZolam  (XANAX ) 0.25 MG tablet, Take 1 tablet (0.25 mg total) by mouth every evening. (Patient taking differently: Take 0.25 mg by mouth as needed.), Disp: 10 tablet, Rfl: 0 .  amLODipine  (NORVASC ) 10 MG tablet, Take 1 tablet (10 mg total) by mouth daily., Disp: 90 tablet, Rfl: 1 .  Apoaequorin (PREVAGEN PO), Take 1 tablet by mouth daily., Disp: , Rfl:  .  Aspirin  81 MG CAPS, , Disp: , Rfl:  .  benzonatate  (TESSALON  PERLES) 100 MG capsule, Take 1 capsule (100 mg total) by mouth 3 (three) times daily as needed for cough., Disp: 30 capsule, Rfl: 1 .  CALCIUM  PO, Take 1 tablet by mouth daily. 600 mg, Disp: , Rfl:  .  carvedilol  (COREG ) 12.5 MG tablet, TAKE (1) TABLET BY MOUTH TWICE DAILY., Disp: 180 tablet, Rfl: 1 .  cetirizine (ZYRTEC) 10 MG tablet, Take 10  mg by mouth at bedtime., Disp: , Rfl:  .  denosumab  (PROLIA ) 60 MG/ML SOSY injection, Inject 60 mg into the skin every 6 (six) months. 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[address], [address], [place] [address].Appt on 08/03/22, Disp: 1 mL, Rfl: 0 .  dexamethasone  (DECADRON ) 2 MG tablet, Take 2 mg by mouth 2 (two) times daily., Disp: , Rfl:  .  diclofenac  Sodium (VOLTAREN ) 1 % GEL, Apply 4 g topically 4 (four) times daily., Disp: 150 g, Rfl: 1 .  ezetimibe  (ZETIA ) 10 MG tablet, Take 1 tablet (10 mg total) by mouth daily., Disp: 90 tablet, Rfl: 3 .  famotidine  (PEPCID ) 20 MG tablet, TAKE ONE TABLET BY MOUTH ONCE DAILY., Disp: 30 tablet, Rfl: 0 .  fluticasone  (FLONASE ) 50 MCG/ACT nasal spray, Place 2 sprays into both nostrils daily., Disp: 16 g, Rfl: 0 .  losartan  (COZAAR ) 25 MG tablet, Take 1 tablet (25 mg total) by mouth daily., Disp: 90 tablet, Rfl: 2 .  methocarbamol (ROBAXIN) 500 MG tablet, Take 500 mg by mouth 2 (two) times daily., Disp: , Rfl:  .  Multiple Vitamin (MULTIVITAMIN WITH MINERALS) TABS,  Take 1 tablet by mouth daily., Disp: , Rfl:  .  nitroGLYCERIN  (NITROSTAT ) 0.4 MG SL tablet, Place 1 tablet (0.4 mg total) under the tongue every 5 (five) minutes x 3 doses as needed for chest pain., Disp: 30 tablet, Rfl: 3 .  ondansetron  (ZOFRAN -ODT) 4 MG disintegrating tablet, Take 4 mg by mouth every 6 (six) hours as needed., Disp: , Rfl:  .  polyethylene glycol (MIRALAX  / GLYCOLAX ) 17 g packet, Take 17 g by mouth daily. (Patient taking differently: Take 17 g by mouth as needed.), Disp: , Rfl:  .  pregabalin  (LYRICA ) 75 MG capsule, TAKE 1 CAPSULE BY MOUTH THREE TIMES A DAY. (Patient taking differently: as needed. TAKE 1 CAPSULE BY MOUTH THREE TIMES A DAY.), Disp: 270 capsule, Rfl: 0 .  Probiotic Product (PROBIOTIC DAILY PO), Take 1 tablet by mouth daily at 12 noon., Disp: , Rfl:  .  sertraline  (ZOLOFT ) 25 MG tablet, Take 1 tablet (25 mg total) by mouth daily., Disp: 90 tablet, Rfl: 2 .  tiZANidine  (ZANAFLEX ) 4 MG tablet, TAKE 1 TABLET BY MOUTH ONCE AT BEDTIME AS NEEDED FOR MUSCLE SPASMS., Disp: 30 tablet, Rfl: 0 .  traMADol  (ULTRAM ) 50 MG tablet, Take 1 tablet (50 mg total) by mouth every 6 (six) hours as needed for severe pain (pain score 7-10)., Disp: 15 tablet, Rfl: 0 .  triamcinolone  cream (KENALOG ) 0.1 %, Apply 1 Application topically 2 (two) times daily. (Patient taking differently: Apply 1 Application topically as needed.), Disp: 30 g, Rfl: 0 .  pravastatin  (PRAVACHOL ) 40 MG tablet, Take 1 tablet (40 mg total) by mouth every evening., Disp: 90 tablet, Rfl: 3   Allergies  Allergen Reactions  . Codeine Hypertension  . Ibuprofen Other (See Comments)    Makes sick on stomach      The patient states she uses status post hysterectomy for birth control. No LMP recorded. Patient has had a hysterectomy.. Negative for Dysmenorrhea. Negative for: breast discharge, breast lump(s), breast pain and breast self exam. Associated symptoms include abnormal vaginal bleeding. Pertinent negatives include  abnormal bleeding (hematology), anxiety, decreased libido, depression, difficulty falling sleep, dyspareunia, history of infertility, nocturia, sexual dysfunction, sleep disturbances, urinary incontinence, urinary urgency, vaginal discharge and vaginal itching. Diet regular.The patient states her exercise level is  intermittent.  . The patient's tobacco use is:  Social History   Tobacco  Use  Smoking Status Former  . Current packs/day: 0.00  . Average packs/day: 0.3 packs/day for 5.0 years (1.3 ttl pk-yrs)  . Types: Cigarettes  . Start date: 11/28/1963  . Quit date: 11/27/1968  . Years since quitting: 54.6  . Passive exposure: Never  Smokeless Tobacco Never  . She has been exposed to passive smoke. The patient's alcohol use is:  Social History   Substance and Sexual Activity  Alcohol Use No      Review of Systems  Constitutional: Negative.   HENT: Negative.    Eyes: Negative.   Respiratory: Negative.    Cardiovascular: Negative.   Gastrointestinal:  Positive for nausea.  Endocrine: Negative.   Genitourinary: Negative.   Musculoskeletal: Negative.   Skin: Negative.   Allergic/Immunologic: Negative.   Neurological: Negative.   Hematological: Negative.   Psychiatric/Behavioral: Negative.       Today's Vitals   07/04/23 1121  BP: 110/74  Temp: 98.1 F (36.7 C)  SpO2: 98%  Weight: 193 lb 6.4 oz (87.7 kg)  Height: 5\' 3"  (1.6 m)   Body mass index is 34.26 kg/m.  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 Aaron Aas Patient was given opportunity to ask questions. Patient verbalized understanding of the plan and was able to repeat key elements of the plan. All questions were answered to their satisfaction.   Smiley Dung, MD  I, Smiley Dung, MD, have reviewed all documentation for this visit. The documentation on 07/04/23 for the exam, diagnosis, procedures, and orders are all accurate and complete.

## 2023-07-04 NOTE — Patient Instructions (Addendum)
 Take two pravastatin  40mg  tablets daily Monday thru Saturday, skip Sunday  Health Maintenance, Female Adopting a healthy lifestyle and getting preventive care are important in promoting health and wellness. Ask your health care provider about: The right schedule for you to have regular tests and exams. Things you can do on your own to prevent diseases and keep yourself healthy. What should I know about diet, weight, and exercise? Eat a healthy diet  Eat a diet that includes plenty of vegetables, fruits, low-fat dairy products, and lean protein. Do not eat a lot of foods that are high in solid fats, added sugars, or sodium. Maintain a healthy weight Body mass index (BMI) is used to identify weight problems. It estimates body fat based on height and weight. Your health care provider can help determine your BMI and help you achieve or maintain a healthy weight. Get regular exercise Get regular exercise. This is one of the most important things you can do for your health. Most adults should: Exercise for at least 150 minutes each week. The exercise should increase your heart rate and make you sweat (moderate-intensity exercise). Do strengthening exercises at least twice a week. This is in addition to the moderate-intensity exercise. Spend less time sitting. Even light physical activity can be beneficial. Watch cholesterol and blood lipids Have your blood tested for lipids and cholesterol at 84 years of age, then have this test every 5 years. Have your cholesterol levels checked more often if: Your lipid or cholesterol levels are high. You are older than 84 years of age. You are at high risk for heart disease. What should I know about cancer screening? Depending on your health history and family history, you may need to have cancer screening at various ages. This may include screening for: Breast cancer. Cervical cancer. Colorectal cancer. Skin cancer. Lung cancer. What should I know about  heart disease, diabetes, and high blood pressure? Blood pressure and heart disease High blood pressure causes heart disease and increases the risk of stroke. This is more likely to develop in people who have high blood pressure readings or are overweight. Have your blood pressure checked: Every 3-5 years if you are 20-52 years of age. Every year if you are 28 years old or older. Diabetes Have regular diabetes screenings. This checks your fasting blood sugar level. Have the screening done: Once every three years after age 39 if you are at a normal weight and have a low risk for diabetes. More often and at a younger age if you are overweight or have a high risk for diabetes. What should I know about preventing infection? Hepatitis B If you have a higher risk for hepatitis B, you should be screened for this virus. Talk with your health care provider to find out if you are at risk for hepatitis B infection. Hepatitis C Testing is recommended for: Everyone born from [birth year] through 1965. Anyone with known risk factors for hepatitis C. Sexually transmitted infections (STIs) Get screened for STIs, including gonorrhea and chlamydia, if: You are sexually active and are younger than 84 years of age. You are older than 84 years of age and your health care provider tells you that you are at risk for this type of infection. Your sexual activity has changed since you were last screened, and you are at increased risk for chlamydia or gonorrhea. Ask your health care provider if you are at risk. Ask your health care provider about whether you are at high risk for HIV. Your  health care provider may recommend a prescription medicine to help prevent HIV infection. If you choose to take medicine to prevent HIV, you should first get tested for HIV. You should then be tested every 3 months for as long as you are taking the medicine. Pregnancy If you are about to stop having your period (premenopausal) and you may  become pregnant, seek counseling before you get pregnant. Take 400 to 800 micrograms (mcg) of folic acid every day if you become pregnant. Ask for birth control (contraception) if you want to prevent pregnancy. Osteoporosis and menopause Osteoporosis is a disease in which the bones lose minerals and strength with aging. This can result in bone fractures. If you are 70 years old or older, or if you are at risk for osteoporosis and fractures, ask your health care provider if you should: Be screened for bone loss. Take a calcium  or vitamin D  supplement to lower your risk of fractures. Be given hormone replacement therapy (HRT) to treat symptoms of menopause. Follow these instructions at home: Alcohol use Do not drink alcohol if: Your health care provider tells you not to drink. You are pregnant, may be pregnant, or are planning to become pregnant. If you drink alcohol: Limit how much you have to: 0-1 drink a day. Know how much alcohol is in your drink. In the [place] one drink equals one 12 oz bottle of beer (355 mL), one 5 oz glass of wine (148 mL), or one 1 oz glass of hard liquor (44 mL). Lifestyle Do not use any products that contain nicotine or tobacco. These products include cigarettes, chewing tobacco, and vaping devices, such as e-cigarettes. If you need help quitting, ask your health care provider. Do not use street drugs. Do not share needles. Ask your health care provider for help if you need support or information about quitting drugs. General instructions Schedule regular health, dental, and eye exams. Stay current with your vaccines. Tell your health care provider if: You often feel depressed. You have ever been abused or do not feel safe at home. Summary Adopting a healthy lifestyle and getting preventive care are important in promoting health and wellness. Follow your health care provider's instructions about healthy diet, exercising, and getting tested or screened for  diseases. Follow your health care provider's instructions on monitoring your cholesterol and blood pressure. This information is not intended to replace advice given to you by your health care provider. Make sure you discuss any questions you have with your health care provider. Document Revised: 06/22/2020 Document Reviewed: 06/22/2020 Elsevier Patient Education  2024 [employer].

## 2023-07-04 NOTE — Progress Notes (Signed)
 Complex Care Management Care Guide Note  07/04/2023 Name: Kathryn [name] MRN: [id number] DOB: 09-Sep-1940  Kathryn [name] is a 83 y.o. year old female who is a primary care patient of Cleave Curling, MD and is actively engaged with the care management team. I reached out to Kathryn [name] by phone today to assist with re-scheduling  with the RN Case Manager Licensed Clinical Social Worker.  Follow up plan: Unsuccessful telephone outreach attempt made. No further outreach attempts will be made at this time. We have been unable to contact the patient to reschedule for complex care management services.  Barnie Bora  [facility]  [facility], [facility] Guide  Direct Dial: [phone]  Fax [phone]

## 2023-07-06 LAB — HEMOGLOBIN A1C
Est. average glucose Bld gHb Est-mCnc: 114 mg/dL
Hgb A1c MFr Bld: 5.6 % (ref 4.8–5.6)

## 2023-07-06 LAB — IRON,TIBC AND FERRITIN PANEL
Ferritin: 326 ng/mL — ABNORMAL HIGH (ref 15–150)
Iron Saturation: 26 % (ref 15–55)
Iron: 76 ug/dL (ref 27–139)
Total Iron Binding Capacity: 287 ug/dL (ref 250–450)
UIBC: 211 ug/dL (ref 118–369)

## 2023-07-06 LAB — CBC
Hematocrit: 41.8 % (ref 34.0–46.6)
Hemoglobin: 12.3 g/dL (ref 11.1–15.9)
MCH: 22.9 pg — ABNORMAL LOW (ref 26.6–33.0)
MCHC: 29.4 g/dL — ABNORMAL LOW (ref 31.5–35.7)
MCV: 78 fL — ABNORMAL LOW (ref 79–97)
Platelets: 506 10*3/uL — ABNORMAL HIGH (ref 150–450)
RBC: 5.38 x10E6/uL — ABNORMAL HIGH (ref 3.77–5.28)
RDW: 14.7 % (ref 11.7–15.4)
WBC: 8.2 10*3/uL (ref 3.4–10.8)

## 2023-07-06 LAB — BMP8+EGFR
BUN/Creatinine Ratio: 11 — ABNORMAL LOW (ref 12–28)
BUN: 12 mg/dL (ref 8–27)
CO2: 21 mmol/L (ref 20–29)
Calcium: 9.7 mg/dL (ref 8.7–10.3)
Chloride: 103 mmol/L (ref 96–106)
Creatinine, Ser: 1.11 mg/dL — ABNORMAL HIGH (ref 0.57–1.00)
Glucose: 79 mg/dL (ref 70–99)
Potassium: 4.3 mmol/L (ref 3.5–5.2)
Sodium: 142 mmol/L (ref 134–144)
eGFR: 49 mL/min/{1.73_m2} — ABNORMAL LOW (ref >59)

## 2023-07-06 LAB — MICROALBUMIN / CREATININE URINE RATIO
Creatinine, Urine: 254.6 mg/dL
Microalb/Creat Ratio: 17 mg/g{creat} (ref 0–29)
Microalbumin, Urine: 42.4 ug/mL

## 2023-07-08 ENCOUNTER — Ambulatory Visit (HOSPITAL_COMMUNITY): Admission: RE | Admit: 2023-07-08 | Source: Ambulatory Visit

## 2023-07-08 ENCOUNTER — Encounter (HOSPITAL_COMMUNITY): Payer: Self-pay

## 2023-07-11 ENCOUNTER — Ambulatory Visit: Payer: Self-pay | Admitting: Internal Medicine

## 2023-07-12 DIAGNOSIS — G8929 Other chronic pain: Secondary | ICD-10-CM | POA: Insufficient documentation

## 2023-07-12 DIAGNOSIS — K30 Functional dyspepsia: Secondary | ICD-10-CM | POA: Insufficient documentation

## 2023-07-12 DIAGNOSIS — K862 Cyst of pancreas: Secondary | ICD-10-CM | POA: Insufficient documentation

## 2023-07-12 NOTE — Assessment & Plan Note (Signed)

## 2023-07-12 NOTE — Assessment & Plan Note (Addendum)
 Initially seen on MRI, considered to be low risk.  - Schedule f/u to ensure stability - Repeat MRI ordered by GI

## 2023-07-12 NOTE — Assessment & Plan Note (Signed)
 Chronic, LDL goal is less than 70.  Last lipid panel results reviewed.  She has elevated cholesterol levels. Plan to optimize lipid control with increased pravastatin  dosage and drug holiday. - Increase pravastatin  to 80 mg daily, taking two 40 mg tablets Monday through Saturday, skipping Sunday. - Re-evaluate cholesterol levels in two months.

## 2023-07-12 NOTE — Assessment & Plan Note (Signed)
 Previous labs reviewed, her A1c has been elevated in the past. I will check an A1c today. Reminded to avoid refined sugars including sugary drinks/foods and processed meats including bacon, sausages and deli meats.

## 2023-07-12 NOTE — Assessment & Plan Note (Addendum)
 Chronic, currently managed w/ sertraline . Sx exacerbated by her husband's death - She does not wish to increase the dose - May benefit from nightly magnesium supplementation - Revisit at next visit

## 2023-07-12 NOTE — Assessment & Plan Note (Signed)
 Chronic, LDL goal is less than 70.  LDL not yet at goal. Will increase pravastatin  to TWO 40mg  tablets daily.  She will also continue with ASA 81mg  daily and ezetimibe  10mg  daily. She is encouraged to gradually increase her daily activity as tolerated.

## 2023-07-12 NOTE — Assessment & Plan Note (Signed)
 Chronic, controlled.  EKG performed, NSR w/o acute changes.  Chronic, she is encouraged to stay well hydrated, avoid NSAIDs and keep BP controlled to prevent progression of CKD.  She will f/u in three to four months for re-evaluation.  - Continue with amlodipine  10mg  daily - Follow low sodium diet.  - Continue with carvedilol  12.5mg  twice daily - Continue with losartan  25mg  daily - Follow up in six months

## 2023-07-12 NOTE — Assessment & Plan Note (Signed)
 She reports recent "stomach virus", evaluated by GI on 06/29/23. Sx have improved, yet not totally resolved - No fever/chills - Start famotidine  daily

## 2023-07-12 NOTE — Assessment & Plan Note (Signed)
 She is encouraged to strive for BMI less than 30 to decrease cardiac risk. Advised to aim for at least 150 minutes of exercise per week.

## 2023-07-12 NOTE — Assessment & Plan Note (Signed)
 Arthritis with shoulder pain managed with topical heat. Referral to orthopedic specialist planned. - Refer to orthopedic specialist, Dr. Deeann Fare, for further evaluation and management.

## 2023-07-17 IMAGING — MR MR FOOT*L* W/O CM
5 series · 40 of 40 positions shown · non-contrast
Comparison: X-ray 01/12/2021

CLINICAL DATA: Left midfoot pain for 6 weeks

EXAM:
MRI OF THE LEFT FOOT WITHOUT CONTRAST
TECHNIQUE: Multiplanar, multisequence MR imaging of the left foot was
performed. No intravenous contrast was administered.

[Series 4: T1 · coronal · left · 3.0mm · 0.31mm/px · 10 of 45 slices shown (1 of 2)]
[im 1/45]
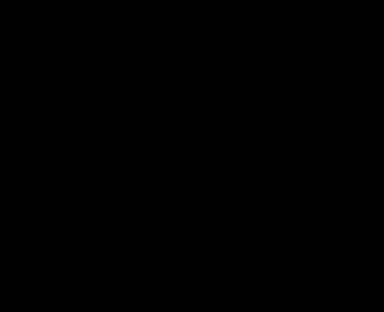
[im 5/45]
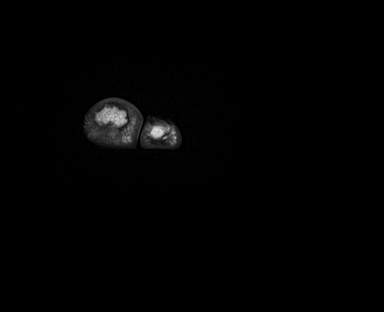
[im 10/45]
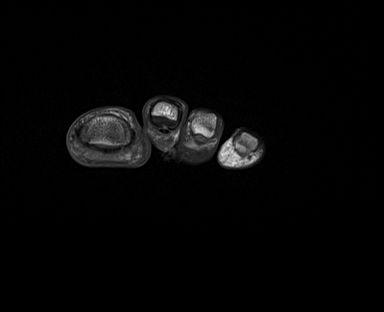
[im 15/45]
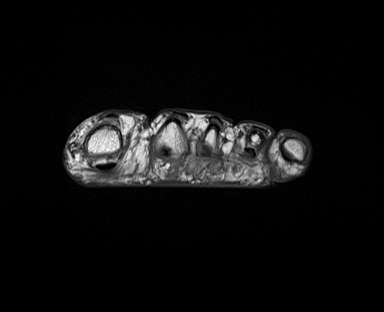
[im 20/45]
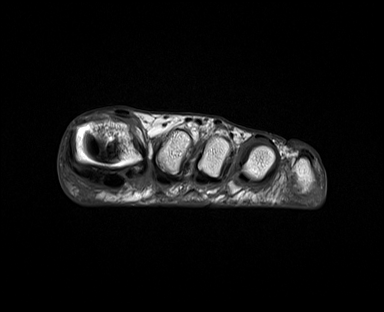
[im 25/45]
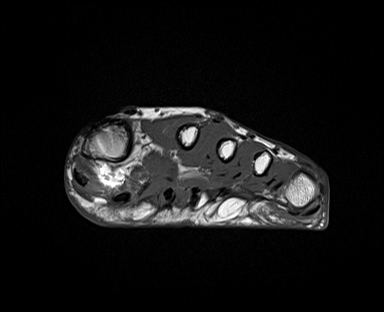
[im 30/45]
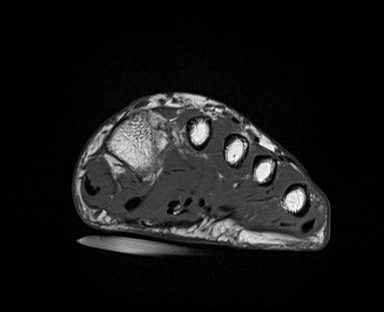
[im 35/45]
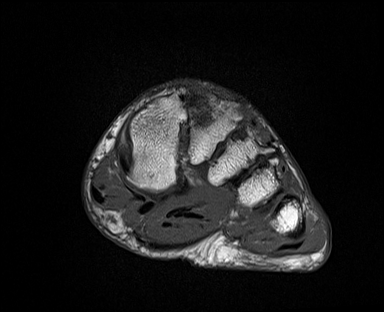
[im 40/45]
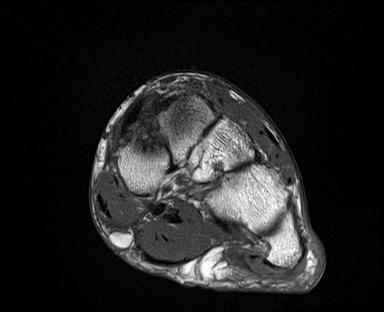
[im 45/45]
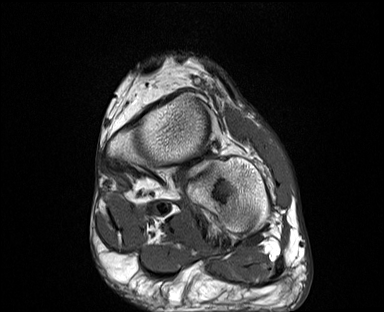

[Series 5: T2 fat-sat · coronal · left · 3.0mm · 0.31mm/px · 11 of 45 slices shown (1 of 2)]
[im 1/45]
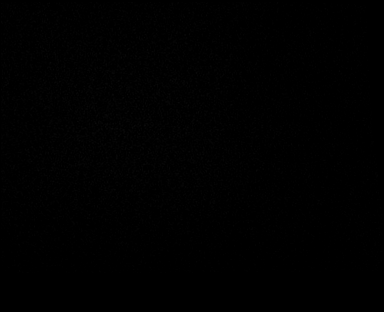
[im 5/45]
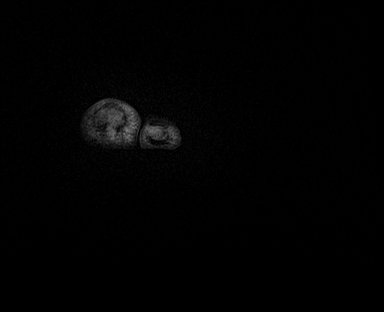
[im 9/45]
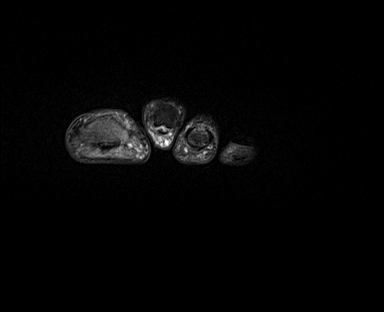
[im 14/45]
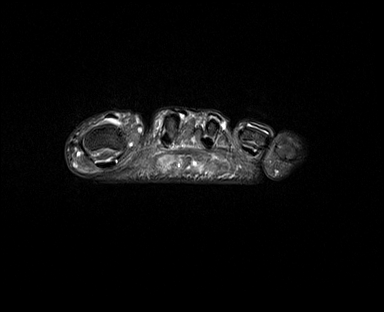
[im 18/45]
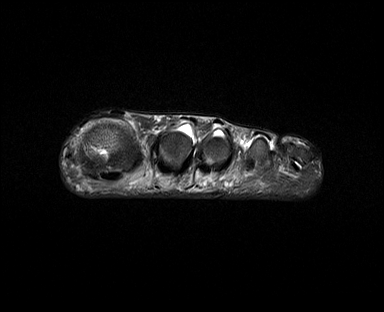
[im 23/45]
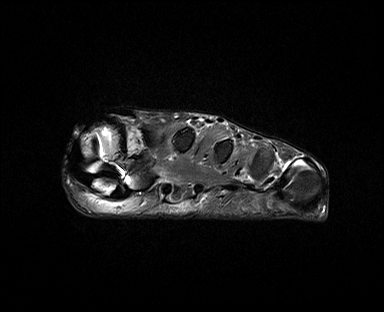
[im 27/45]
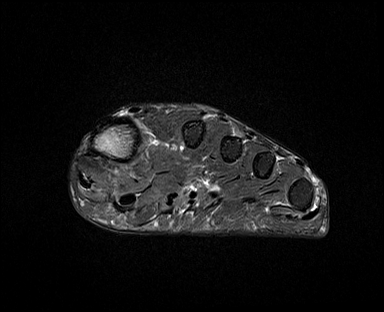
[im 31/45]
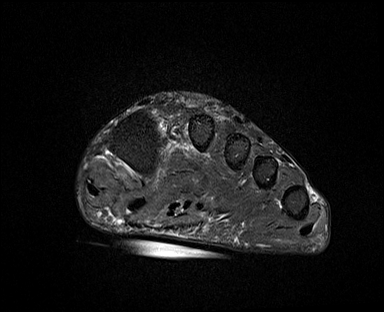
[im 36/45]
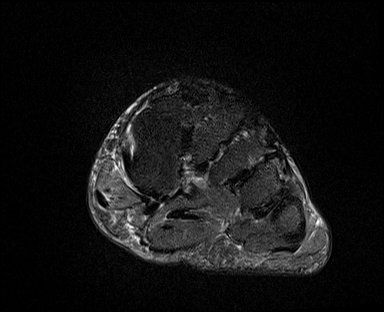
[im 40/45]
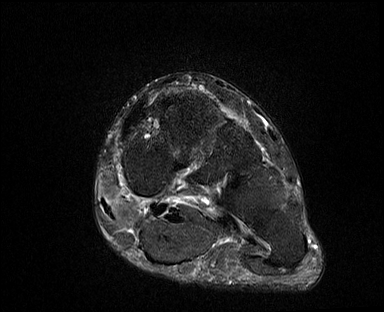
[im 45/45]
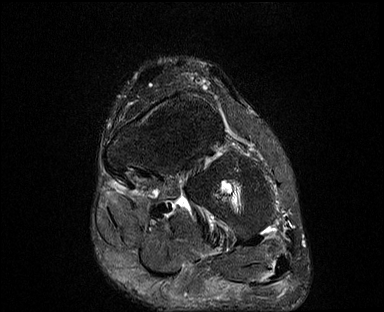

[Series 6: T1 · axial · left · 3.0mm · 0.49mm/px · z∈[-157,-80]mm · 6 of 23 slices shown (2 of 2)]
[im 1/23]
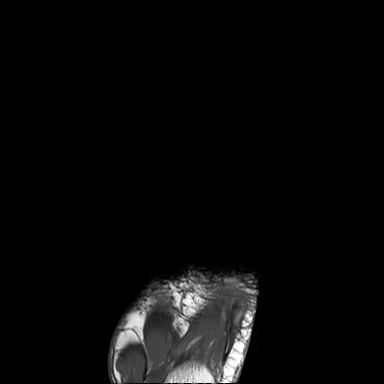
[im 5/23]
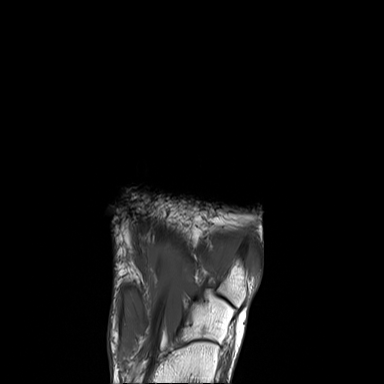
[im 9/23]
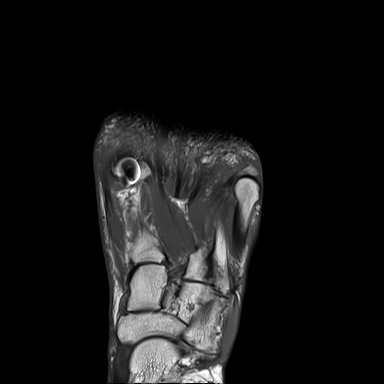
[im 14/23]
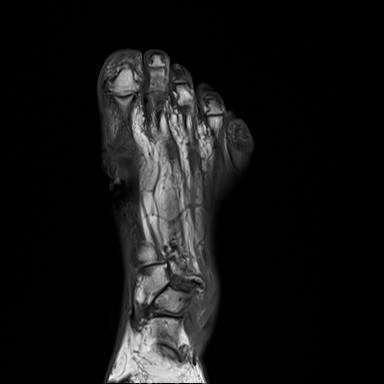
[im 18/23]
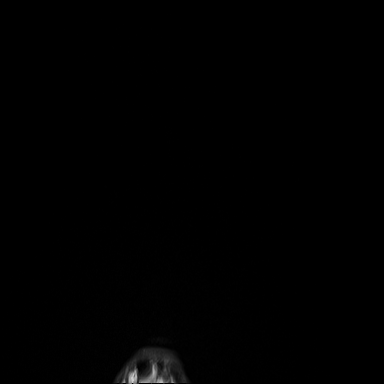
[im 23/23]
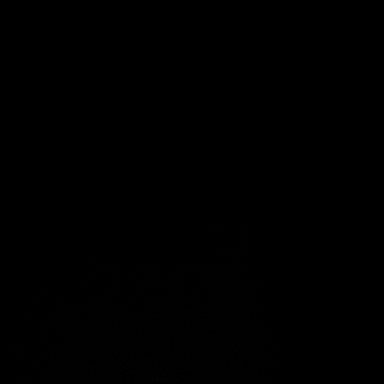

[Series 7: T2 fat-sat · axial · left · 3.0mm · 0.49mm/px · z∈[-157,-80]mm · 6 of 23 slices shown (2 of 2)]
[im 1/23]
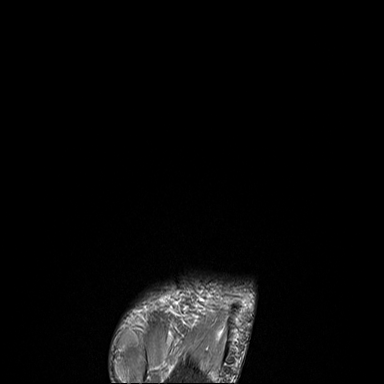
[im 5/23]
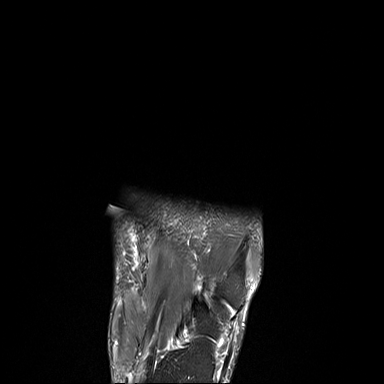
[im 9/23]
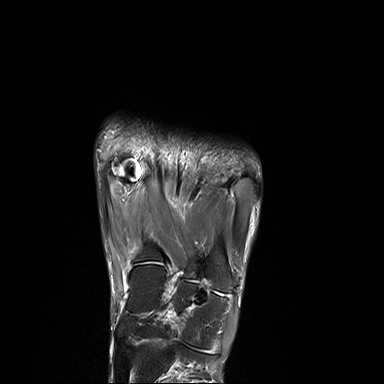
[im 14/23]
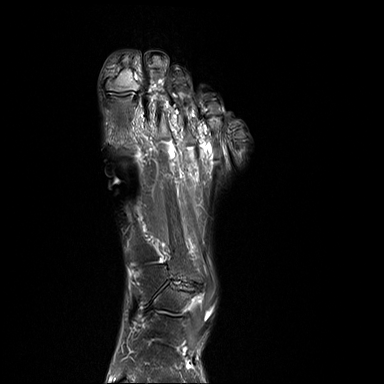
[im 18/23]
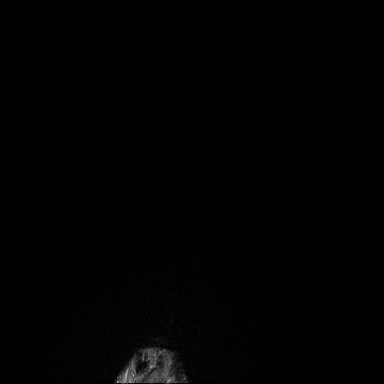
[im 23/23]
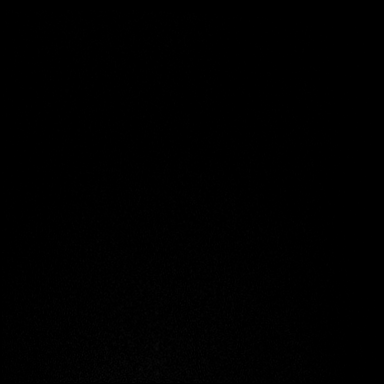

[Series 8: STIR · sagittal · left · 3.0mm · 0.70mm/px · 7 of 27 slices shown]
[im 1/27]
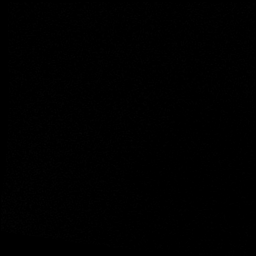
[im 5/27]
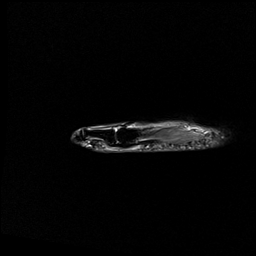
[im 9/27]
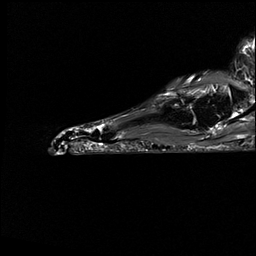
[im 14/27]
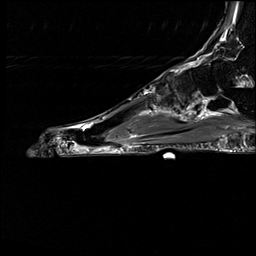
[im 18/27]
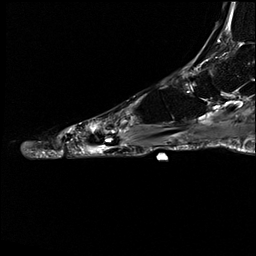
[im 22/27]
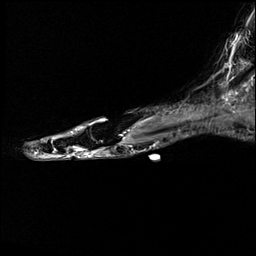
[im 27/27]
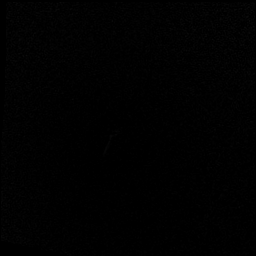

[40 of 40 positions shown; findings below may reference images not displayed]

FINDINGS: Bones/Joint/Cartilage

Postsurgical changes from previous bunion correction surgery with
hardware and resultant metallic susceptibility artifact. Good
alignment at the first MTP joint with mild joint space narrowing and
tiny marginal osteophytes.

Moderate midfoot arthropathy, most severely affecting the second
tarsometatarsal joint with prominent dorsal osteophytosis. Milder
changes at the third TMT joint and naviculocuneiform joint. No
erosion. No effusion. No acute fracture. No malalignment. No marrow
edema or periostitis. No bone lesion.

Ligaments

Intact Lisfranc ligament. Collateral ligaments of the forefoot are
intact.

Muscles and Tendons

Intact flexor and extensor tendons without significant tendinosis.
No tear or tenosynovitis. Minimal intramuscular edema within the
abductor hallucis muscle. Normal muscle bulk atrophy or fatty
infiltration.

Soft tissues

No focal soft tissue swelling.  No fluid collection.  No ulceration.
IMPRESSION: 1. No acute osseous abnormality of the left foot.
2. Moderate midfoot arthropathy, most severely affecting the second
tarsometatarsal joint with prominent dorsal osteophytosis.
3. Intact Lisfranc ligament.
4. Postsurgical changes from previous bunion correction surgery.

## 2023-07-18 ENCOUNTER — Ambulatory Visit (HOSPITAL_COMMUNITY)
Admission: RE | Admit: 2023-07-18 | Discharge: 2023-07-18 | Disposition: A | Discharge status: Home / Self Care | Source: Ambulatory Visit | Attending: Internal Medicine | Admitting: Internal Medicine

## 2023-07-18 DIAGNOSIS — R92321 Mammographic fibroglandular density, right breast: Secondary | ICD-10-CM | POA: Diagnosis not present

## 2023-07-18 DIAGNOSIS — R928 Other abnormal and inconclusive findings on diagnostic imaging of breast: Secondary | ICD-10-CM | POA: Diagnosis not present

## 2023-07-18 DIAGNOSIS — N6489 Other specified disorders of breast: Secondary | ICD-10-CM | POA: Diagnosis not present

## 2023-07-18 DIAGNOSIS — H01002 Unspecified blepharitis right lower eyelid: Secondary | ICD-10-CM | POA: Diagnosis not present

## 2023-07-18 DIAGNOSIS — H16223 Keratoconjunctivitis sicca, not specified as Sjogren's, bilateral: Secondary | ICD-10-CM | POA: Diagnosis not present

## 2023-07-18 DIAGNOSIS — Z961 Presence of intraocular lens: Secondary | ICD-10-CM | POA: Diagnosis not present

## 2023-07-18 DIAGNOSIS — H01001 Unspecified blepharitis right upper eyelid: Secondary | ICD-10-CM | POA: Diagnosis not present

## 2023-07-19 DIAGNOSIS — M19211 Secondary osteoarthritis, right shoulder: Secondary | ICD-10-CM | POA: Diagnosis not present

## 2023-07-25 ENCOUNTER — Other Ambulatory Visit (HOSPITAL_COMMUNITY): Payer: Self-pay | Admitting: Gastroenterology

## 2023-07-25 ENCOUNTER — Ambulatory Visit (HOSPITAL_COMMUNITY)
Admission: RE | Admit: 2023-07-25 | Discharge: 2023-07-25 | Disposition: A | Discharge status: Home / Self Care | Source: Ambulatory Visit | Attending: Gastroenterology | Admitting: Gastroenterology

## 2023-07-25 DIAGNOSIS — K5730 Diverticulosis of large intestine without perforation or abscess without bleeding: Secondary | ICD-10-CM | POA: Diagnosis not present

## 2023-07-25 DIAGNOSIS — K862 Cyst of pancreas: Secondary | ICD-10-CM

## 2023-07-25 DIAGNOSIS — N261 Atrophy of kidney (terminal): Secondary | ICD-10-CM | POA: Diagnosis not present

## 2023-07-25 MED ORDER — GADOBUTROL 1 MMOL/ML IV SOLN
8.0000 mL | Freq: Once | INTRAVENOUS | Status: AC | PRN
  Administered 2023-07-25: 8 mL via INTRAVENOUS

## 2023-08-02 ENCOUNTER — Other Ambulatory Visit: Payer: Self-pay | Admitting: Gastroenterology

## 2023-09-01 ENCOUNTER — Encounter (HOSPITAL_COMMUNITY): Payer: Self-pay | Admitting: Gastroenterology

## 2023-09-06 ENCOUNTER — Ambulatory Visit (INDEPENDENT_AMBULATORY_CARE_PROVIDER_SITE_OTHER): Admitting: Internal Medicine

## 2023-09-06 ENCOUNTER — Encounter: Payer: Self-pay | Admitting: Internal Medicine

## 2023-09-06 VITALS — BP 110/70 | HR 69 | Temp 98.6°F | Ht 63.0 in | Wt 194.0 lb

## 2023-09-06 DIAGNOSIS — E7800 Pure hypercholesterolemia, unspecified: Secondary | ICD-10-CM | POA: Diagnosis not present

## 2023-09-06 DIAGNOSIS — M549 Dorsalgia, unspecified: Secondary | ICD-10-CM

## 2023-09-06 DIAGNOSIS — R7303 Prediabetes: Secondary | ICD-10-CM | POA: Diagnosis not present

## 2023-09-06 DIAGNOSIS — I1310 Hypertensive heart and chronic kidney disease without heart failure, with stage 1 through stage 4 chronic kidney disease, or unspecified chronic kidney disease: Secondary | ICD-10-CM

## 2023-09-06 DIAGNOSIS — M7581 Other shoulder lesions, right shoulder: Secondary | ICD-10-CM

## 2023-09-06 DIAGNOSIS — I25118 Atherosclerotic heart disease of native coronary artery with other forms of angina pectoris: Secondary | ICD-10-CM

## 2023-09-06 DIAGNOSIS — K862 Cyst of pancreas: Secondary | ICD-10-CM | POA: Diagnosis not present

## 2023-09-06 DIAGNOSIS — F4323 Adjustment disorder with mixed anxiety and depressed mood: Secondary | ICD-10-CM | POA: Diagnosis not present

## 2023-09-06 DIAGNOSIS — E66811 Obesity, class 1: Secondary | ICD-10-CM

## 2023-09-06 DIAGNOSIS — E6609 Other obesity due to excess calories: Secondary | ICD-10-CM

## 2023-09-06 DIAGNOSIS — Z6833 Body mass index (BMI) 33.0-33.9, adult: Secondary | ICD-10-CM

## 2023-09-06 DIAGNOSIS — N1831 Chronic kidney disease, stage 3a: Secondary | ICD-10-CM | POA: Diagnosis not present

## 2023-09-06 NOTE — Patient Instructions (Addendum)
 Prediabetes: What to Know Prediabetes is when your blood sugar, also called glucose, is at a higher level than normal but not high enough for you to be diagnosed with type 2 diabetes (type 2 diabetes mellitus). Having prediabetes puts you at risk for getting type 2 diabetes. By making some healthy changes, you may be able to prevent or delay getting type 2 diabetes. This is important because type 2 diabetes can lead to serious problems. Some of these include: Heart disease. Stroke. Blindness. Kidney disease. Depression. Poor blood flow in the feet and legs. In very bad cases, this could lead to having a leg removed by surgery (amputation). What are the causes? The exact cause of prediabetes isn't known. It may result from insulin resistance. Insulin resistance happens when cells in the body don't respond properly to insulin that the body makes. This can cause too much sugar to build up in the blood. High blood sugar, also called hyperglycemia, can develop. What increases the risk? Having a family member with type 2 diabetes. Being older than 84 years of age. Having had a temporary form of diabetes during a pregnancy. This is called gestational diabetes. Having had polycystic ovary syndrome (PCOS). Being overweight or obese. Being inactive and not getting much exercise. Having a history of heart disease. This may include problems with cholesterol levels, high levels of blood fats, or high blood pressure. What are the signs or symptoms? You may have no symptoms. If you do have symptoms, they may include: Increased hunger. Increased thirst. Needing to pee more often. Changes in how you see, like blurry vision. Feeling tired. How is this diagnosed? Prediabetes can be diagnosed with blood tests that check your blood sugar. One or more of these tests may be done: A fasting blood glucose (FBG) test. You won't be allowed to eat (you will fast) for at least 8 hours before a blood sample is  taken. An A1C blood test, also called a hemoglobin A1C test. This test shows information about blood sugar levels over the past 2?3 months. An oral glucose tolerance test (OGTT). This test measures your blood sugar at two points in time: After you haven't eaten for a while. This is your baseline level. Two hours after you drink a beverage that has sugar in it. You may be diagnosed with prediabetes if: Your FBG is 100?125 mg/dL (1.6-1.0 mmol/L). Your A1C level is 5.7?6.4% (39-46 mmol/mol). Your OGTT result is 140?199 mg/dL (9.6-04 mmol/L). These blood tests may need to be done again to be sure of the diagnosis. How is this treated? Treatment may include making changes to your diet and lifestyle. These changes can help lower your blood sugar and keep you from getting type 2 diabetes. In some cases, medicine may be given to help lower your risk. Follow these instructions at home: Eating and drinking  Eat and drink as told. Follow a healthy meal plan. This includes eating lean proteins, whole grains, legumes, fresh fruits and vegetables, low-fat dairy products, and healthy fats. Meet with an expert in healthy eating called a dietitian. This person can help create a healthy eating plan that's right for you. Lifestyle Do moderate-intensity exercise. Do this for at least 30 minutes a day on 5 or more days each week, or as told by your health care provider. A mix of activities may be best. Good choices include brisk walking, swimming, biking, and weight lifting. Try to lose weight if your provider says it's OK. Losing 5-7% of your body weight can  help reverse insulin resistance. Do not drink alcohol if: Your provider tells you not to drink. You're pregnant, may be pregnant, or plan to become pregnant. If you drink alcohol: Limit how much you have to: 0-1 drink a day if you're female. 0-2 drinks a day if you're female. Know how much alcohol is in your drink. In the [place] one drink is one 12 oz  bottle of beer (355 mL), one 5 oz glass of wine (148 mL), or one 1 oz glass of hard liquor (44 mL). General instructions Take medicines only as told. You may be given medicines that help lower the risk of type 2 diabetes. Do not smoke, vape, or use nicotine or tobacco. Where to find more information American Diabetes Association: diabetes.org/about-diabetes/prediabetes Academy of Nutrition and Dietetics: eatright.org American Heart Association: Go to ThisJobs.cz. Click the search icon. Type "prediabetes" in the search box. Contact a health care provider if: You have any of these symptoms: Increased hunger. Peeing more often than usual. Increased thirst. Feeling tired. Changes in how you see, like blurry vision. Feeling like you may throw up. Throwing up. Get help right away if: You have shortness of breath. You feel confused. This information is not intended to replace advice given to you by your health care provider. Make sure you discuss any questions you have with your health care provider. Document Revised: 09/04/2022 Document Reviewed: 09/04/2022 Elsevier Patient Education  2024 [employer].

## 2023-09-06 NOTE — Progress Notes (Signed)
 I,Victoria T Emmitt, CMA,acting as a Neurosurgeon for Catheryn LOISE Slocumb, MD.,have documented all relevant documentation on the behalf of Catheryn LOISE Slocumb, MD,as directed by  Catheryn LOISE Slocumb, MD while in the presence of Catheryn LOISE Slocumb, MD.  Subjective:  Patient ID: Kathryn [name] , female    DOB: 04/14/1940 , 83 y.o.   MRN: [id number]  Chief Complaint  Patient presents with   Hypertension   Prediabetes    HPI Discussed the use of AI scribe software for clinical note transcription with the patient, who gave verbal consent to proceed.  History of Present Illness Kathryn [name] is an 83 year old female who presents for a routine follow-up of hyperlipidemia, hypertension and prediabetes.  She experiences discomfort in the upper back, distinct from the lower back where she previously had surgery. She is scheduled to see pain management for this issue. Her current pain management includes Tylenol  and tramadol , and she is also on Lyrica , although there was some initial confusion about its usage.  Her medication regimen includes amlodipine  10 mg for hypertension, carvedilol  12.5 mg twice daily, Zetia  10 mg, famotidine  20 mg daily, Flonase  as needed, losartan  25 mg daily, pravastatin  40 mg daily, and sertraline  25 mg daily. She receives Prolia  injections at a different location. Her daughter assists with managing her medications, but she wants to handle them herself to maintain independence.  She has a history of a rotator cuff issue, for which surgery was suggested but declined. She received an injection for this condition. She engages in walking and plans to resume water  aerobics for exercise.  She is scheduled for a repeat test on her stomach this Friday, following a previous test that showed a larger, non-cancerous cyst in the pancreas. The upcoming procedure is expected to last three hours.  Her appetite has improved, and her weight is stable. She did not eat breakfast today as she was unsure  about having blood work done. She drove herself to the appointment and plans to visit her sister afterward.  She feels 'pretty good' most of the time, with a stable mood on sertraline . She engages in some physical activity. No issues with her appetite, which has improved.   Hypertension This is a chronic problem. The current episode started more than 1 year ago. The problem has been gradually improving since onset. The problem is controlled. Pertinent negatives include no blurred vision, chest pain, palpitations or shortness of breath. Risk factors for coronary artery disease include dyslipidemia, obesity, post-menopausal state and sedentary lifestyle. The current treatment provides moderate improvement. Compliance problems include exercise.  Hypertensive end-organ damage includes kidney disease and CAD/MI.     Past Medical History:  Diagnosis Date   Anxiety    takes Xanax  bid prn   Arthritis    Bruises easily    pt is on Plavix    Chronic back pain    buldging disc   Chronic neck pain    Coronary artery disease    1 stent    Dysphagia    Headache(784.0)    related to cervical issues   History of colonic polyps    Hyperlipidemia    takes Crestor  daily   Hypertension    takes Coreg  and Diovan  daily   Myocardial infarction [facility]) 2019   Osteoporosis    was taking shot 2xyr;but not taking anymore   Peripheral vascular disease (HCC)    Sciatica    Seasonal allergies    takes Zyrtec nightly     Family  History  Problem Relation Age of Onset   Heart disease Mother        Heart Dissease before age 40   Heart attack Mother    Cancer Father    Leukemia Sister    Cancer Sister    Lupus Sister    Cancer Brother    Heart disease Brother        Amputation   Heart attack Brother    Heart disease Brother    Heart attack Brother    Dementia Other    Healthy Son    Healthy Daughter    Healthy Daughter    Healthy Daughter    Anesthesia problems Neg Hx    Hypotension Neg Hx     Malignant hyperthermia Neg Hx    Pseudochol deficiency Neg Hx      Current Outpatient Medications:    acetaminophen  (TYLENOL ) 325 MG tablet, Take 325 mg by mouth every 6 (six) hours as needed for mild pain., Disp: , Rfl:    ALPRAZolam  (XANAX ) 0.25 MG tablet, Take 1 tablet (0.25 mg total) by mouth every evening. (Patient taking differently: Take 0.25 mg by mouth as needed.), Disp: 10 tablet, Rfl: 0   amLODipine  (NORVASC ) 10 MG tablet, Take 1 tablet (10 mg total) by mouth daily., Disp: 90 tablet, Rfl: 1   Apoaequorin (PREVAGEN PO), Take 1 tablet by mouth daily., Disp: , Rfl:    Aspirin  81 MG CAPS, , Disp: , Rfl:    benzonatate  (TESSALON  PERLES) 100 MG capsule, Take 1 capsule (100 mg total) by mouth 3 (three) times daily as needed for cough., Disp: 30 capsule, Rfl: 1   CALCIUM  PO, Take 1 tablet by mouth daily. 600 mg, Disp: , Rfl:    carvedilol  (COREG ) 12.5 MG tablet, TAKE (1) TABLET BY MOUTH TWICE DAILY., Disp: 180 tablet, Rfl: 1   cetirizine (ZYRTEC) 10 MG tablet, Take 10 mg by mouth at bedtime., Disp: , Rfl:    denosumab  (PROLIA ) 60 MG/ML SOSY injection, Inject 60 mg into the skin every 6 (six) months. 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[address], [address], [place] [address].Appt on 08/03/22, Disp: 1 mL, Rfl: 0   diclofenac  Sodium (VOLTAREN ) 1 % GEL, Apply 4 g topically 4 (four) times daily., Disp: 150 g, Rfl: 1   ezetimibe  (ZETIA ) 10 MG tablet, Take 1 tablet (10 mg total) by mouth daily., Disp: 90 tablet, Rfl: 3   famotidine  (PEPCID ) 20 MG tablet, Take 1 tablet (20 mg total) by mouth daily., Disp: 30 tablet, Rfl: 1   fluticasone  (FLONASE ) 50 MCG/ACT nasal spray, Place 2 sprays into both nostrils daily., Disp: 16 g, Rfl: 0   losartan  (COZAAR ) 25 MG tablet, Take 1 tablet (25 mg total) by mouth daily., Disp: 90 tablet, Rfl: 2   methocarbamol (ROBAXIN) 500 MG tablet, Take 500 mg by mouth 2 (two) times daily., Disp: , Rfl:    Multiple Vitamin (MULTIVITAMIN WITH MINERALS) TABS, Take 1 tablet by mouth  daily., Disp: , Rfl:    nitroGLYCERIN  (NITROSTAT ) 0.4 MG SL tablet, Place 1 tablet (0.4 mg total) under the tongue every 5 (five) minutes x 3 doses as needed for chest pain., Disp: 30 tablet, Rfl: 3   ondansetron  (ZOFRAN -ODT) 4 MG disintegrating tablet, Take 4 mg by mouth every 6 (six) hours as needed., Disp: , Rfl:    polyethylene glycol (MIRALAX  / GLYCOLAX ) 17 g packet, Take 17 g by mouth daily. (Patient taking differently: Take 17 g by mouth as needed.), Disp: , Rfl:  pravastatin  (PRAVACHOL ) 40 MG tablet, Take 1 tablet (40 mg total) by mouth every evening., Disp: 90 tablet, Rfl: 3   sertraline  (ZOLOFT ) 25 MG tablet, Take 1 tablet (25 mg total) by mouth daily., Disp: 90 tablet, Rfl: 2   tiZANidine  (ZANAFLEX ) 4 MG tablet, TAKE 1 TABLET BY MOUTH ONCE AT BEDTIME AS NEEDED FOR MUSCLE SPASMS., Disp: 30 tablet, Rfl: 0   traMADol  (ULTRAM ) 50 MG tablet, Take 1 tablet (50 mg total) by mouth every 6 (six) hours as needed for severe pain (pain score 7-10)., Disp: 15 tablet, Rfl: 0   triamcinolone  cream (KENALOG ) 0.1 %, Apply 1 Application topically 2 (two) times daily. (Patient taking differently: Apply 1 Application topically as needed.), Disp: 30 g, Rfl: 0   ciprofloxacin  (CIPRO ) 500 MG tablet, Take 1 tablet (500 mg total) by mouth 2 (two) times daily., Disp: 6 tablet, Rfl: 0   Allergies  Allergen Reactions   Codeine Hypertension   Ibuprofen Other (See Comments)    Makes sick on stomach     Review of Systems  Constitutional: Negative.   HENT: Negative.    Eyes:  Negative for blurred vision.  Respiratory: Negative.  Negative for shortness of breath.   Cardiovascular: Negative.  Negative for chest pain and palpitations.  Gastrointestinal: Negative.   Musculoskeletal:  Positive for back pain.  Neurological: Negative.   Psychiatric/Behavioral: Negative.       Today's Vitals   09/06/23 1015  BP: 110/70  Pulse: 69  Temp: 98.6 F (37 C)  SpO2: 98%  Weight: 194 lb (88 kg)  Height: 5' 3 (1.6  m)   Body mass index is 34.37 kg/m.  Wt Readings from Last 3 Encounters:  09/08/23 191 lb 12.8 oz (87 kg)  09/06/23 194 lb (88 kg)  07/04/23 193 lb 6.4 oz (87.7 kg)     Objective:  Physical Exam Vitals and nursing note reviewed.  Constitutional:      Appearance: Normal appearance. She is obese.  HENT:     Head: Normocephalic and atraumatic.  Eyes:     Extraocular Movements: Extraocular movements intact.  Cardiovascular:     Rate and Rhythm: Normal rate and regular rhythm.     Heart sounds: Normal heart sounds.  Pulmonary:     Effort: Pulmonary effort is normal.     Breath sounds: Normal breath sounds.  Musculoskeletal:     Cervical back: Normal range of motion.  Skin:    General: Skin is warm.  Neurological:     General: No focal deficit present.     Mental Status: She is alert.  Psychiatric:        Mood and Affect: Mood normal.        Behavior: Behavior normal.         Assessment And Plan:  Pure hypercholesterolemia Assessment & Plan: Chronic, LDL goal is less than 70.  Last lipid panel results reviewed.  She has elevated cholesterol levels.  She will continue with pravastatin  and Zetia . - Follow heart healthy lifestyle.   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  Regarding CKD, she is encouraged to stay well hydrated, avoid NSAIDs and keep BP controlled to prevent progression of CKD.  She will f/u in three to four months for re-evaluation.  - Continue with amlodipine  10mg  daily - Follow low sodium diet.  - Continue with carvedilol  12.5mg  twice daily -  Continue with losartan  25mg  daily   Orders: -     CMP14+EGFR -     Lipid panel  Coronary artery disease of native artery of native heart with stable angina pectoris [facility]) Assessment & Plan: Chronic, LDL goal is less than 70.  She will continue with pravastatin  and ezetimibe .   - She has been on high intensity statin the past; however, had myalgias.  - Continue with ASA 81mg  and statin therapy - Follow heart healthy lifestyle.    Prediabetes Assessment & Plan: Previous labs reviewed, her A1c has been elevated in the past. I will check an A1c today. Reminded to avoid refined sugars including sugary drinks/foods and processed meats including bacon, sausages and deli meats.    Orders: -     CMP14+EGFR -     Hemoglobin A1c  Upper back pain Assessment & Plan: Pain managed with Tylenol , tramadol , and Lyrica . - Continue Tylenol  and tramadol  as needed. - Continue Lyrica  as prescribed. - Follow up with pain management as scheduled.   Adjustment disorder with mixed anxiety and depressed mood Assessment & Plan: Mood improved with sertraline . - Continue sertraline  25 mg daily.   Right rotator cuff tendinitis Assessment & Plan: Chronic, she has been evaluated by Ortho. Surgery has been suggested; however, she declines at this time.   - consider PT if her sx persist/worsen.   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  - Proceed with scheduled endoscopic ultrasound and fine needle aspiration.   Class 1 obesity due to excess calories with serious comorbidity and body mass index (BMI) of 33.0 to 33.9 in adult Assessment & Plan: She is encouraged to strive for BMI less than 30 to decrease cardiac risk. Advised to aim for at least 150 minutes of exercise per week.     Return in 4 months (on 01/07/2024), or prediabetes check, for cancel sept appt.  Patient was given opportunity to ask questions. Patient verbalized understanding of the plan and was able to repeat key elements of the plan. All questions were answered to their satisfaction.   I, Catheryn LOISE Slocumb, MD, have reviewed all documentation for this visit. The documentation on 09/06/23 for the exam, diagnosis, procedures, and  orders are all accurate and complete.   IF YOU HAVE BEEN REFERRED TO A SPECIALIST, IT MAY TAKE 1-2 WEEKS TO SCHEDULE/PROCESS THE REFERRAL. IF YOU HAVE NOT HEARD FROM US /SPECIALIST IN TWO WEEKS, PLEASE GIVE US  A CALL AT [phone].   THE PATIENT IS ENCOURAGED TO PRACTICE SOCIAL DISTANCING DUE TO THE COVID-19 PANDEMIC.

## 2023-09-07 LAB — LIPID PANEL
Chol/HDL Ratio: 2.4 ratio (ref 0.0–4.4)
Cholesterol, Total: 143 mg/dL (ref 100–199)
HDL: 59 mg/dL (ref >39)
LDL Chol Calc (NIH): 70 mg/dL (ref 0–99)
Triglycerides: 70 mg/dL (ref 0–149)
VLDL Cholesterol Cal: 14 mg/dL (ref 5–40)

## 2023-09-07 LAB — CMP14+EGFR
ALT: 7 IU/L (ref 0–32)
AST: 17 IU/L (ref 0–40)
Albumin: 4.1 g/dL (ref 3.7–4.7)
Alkaline Phosphatase: 74 IU/L (ref 44–121)
BUN/Creatinine Ratio: 9 — ABNORMAL LOW (ref 12–28)
BUN: 11 mg/dL (ref 8–27)
Bilirubin Total: 0.3 mg/dL (ref 0.0–1.2)
CO2: 22 mmol/L (ref 20–29)
Calcium: 9.4 mg/dL (ref 8.7–10.3)
Chloride: 105 mmol/L (ref 96–106)
Creatinine, Ser: 1.17 mg/dL — ABNORMAL HIGH (ref 0.57–1.00)
Globulin, Total: 2.2 g/dL (ref 1.5–4.5)
Glucose: 99 mg/dL (ref 70–99)
Potassium: 4.7 mmol/L (ref 3.5–5.2)
Sodium: 142 mmol/L (ref 134–144)
Total Protein: 6.3 g/dL (ref 6.0–8.5)
eGFR: 46 mL/min/1.73 — ABNORMAL LOW (ref >59)

## 2023-09-07 LAB — HEMOGLOBIN A1C
Est. average glucose Bld gHb Est-mCnc: 117 mg/dL
Hgb A1c MFr Bld: 5.7 % — ABNORMAL HIGH (ref 4.8–5.6)

## 2023-09-07 NOTE — Anesthesia Preprocedure Evaluation (Signed)
 Anesthesia Evaluation  Patient identified by MRN, date of birth, ID band Patient awake    Reviewed: Allergy & Precautions, NPO status , Patient's Chart, lab work & pertinent test results  Airway Mallampati: III  TM Distance: >3 FB Neck ROM: Full    Dental  (+) Teeth Intact, Dental Advisory Given   Pulmonary former smoker   breath sounds clear to auscultation       Cardiovascular hypertension, Pt. on medications and Pt. on home beta blockers + CAD, + Past MI, + Cardiac Stents and + Peripheral Vascular Disease   Rhythm:Regular Rate:Normal + Systolic murmurs Echo (2019):  - Left ventricle: The cavity size was normal. There was moderate    concentric hypertrophy. Systolic function was normal. The    estimated ejection fraction was in the range of 60% to 65%.    Features are consistent with a pseudonormal left ventricular    filling pattern, with concomitant abnormal relaxation and    increased filling pressure (grade 2 diastolic dysfunction).    Doppler parameters are consistent with both elevated ventricular    end-diastolic filling pressure and elevated left atrial filling    pressure.  - Aortic valve: Mildly calcified annulus. Trileaflet; mildly    calcified leaflets. Valve mobility was mildly restricted. 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 Mean gradient (S): 7 mm Hg. Peak gradient (S):    14 mm Hg. Valve area (VTI): 0.88 cm^2. Valve area (Vmax): 1.02    cm^2. Valve area (Vmean): 0.96 cm^2.  - Mitral valve: Calcified annulus. There was mild regurgitation.  - Left atrium: The atrium was mildly dilated.  - Right ventricle: The cavity size was mildly dilated. There was    mild hypertrophy.  - Right atrium: The atrium was mildly dilated.  - Atrial septum: No defect or patent foramen ovale was identified.  - Pulmonary arteries: Moderate pulmoanry hypertension. PA peak    pressure: 40 mm Hg (S) (RA 3 mm Hg).     Neuro/Psych  Headaches  PSYCHIATRIC DISORDERS Anxiety        GI/Hepatic Neg liver ROS,GERD  Medicated,,  Endo/Other  negative endocrine ROS    Renal/GU Renal disease     Musculoskeletal  (+) Arthritis ,    Abdominal   Peds  Hematology negative hematology ROS (+)   Anesthesia Other Findings   Reproductive/Obstetrics                              Anesthesia Physical Anesthesia Plan  ASA: 3  Anesthesia Plan: MAC   Post-op Pain Management: Minimal or no pain anticipated   Induction: Intravenous  PONV Risk Score and Plan: 4 or greater and Propofol  infusion  Airway Management Planned: Natural Airway  Additional Equipment: None  Intra-op Plan:   Post-operative Plan:   Informed Consent:   Plan Discussed with: CRNA and Anesthesiologist  Anesthesia Plan Comments:         Anesthesia Quick Evaluation

## 2023-09-08 ENCOUNTER — Encounter (HOSPITAL_COMMUNITY)
Admission: RE | Disposition: A | Discharge status: Home / Self Care | Payer: Self-pay | Source: Home / Self Care | Attending: Gastroenterology

## 2023-09-08 ENCOUNTER — Ambulatory Visit (HOSPITAL_COMMUNITY)
Admission: RE | Admit: 2023-09-08 | Discharge: 2023-09-08 | Disposition: A | Discharge status: Home / Self Care | Attending: Gastroenterology | Admitting: Gastroenterology

## 2023-09-08 ENCOUNTER — Other Ambulatory Visit (HOSPITAL_COMMUNITY): Payer: Self-pay

## 2023-09-08 ENCOUNTER — Ambulatory Visit (HOSPITAL_BASED_OUTPATIENT_CLINIC_OR_DEPARTMENT_OTHER): Payer: Self-pay | Admitting: Anesthesiology

## 2023-09-08 ENCOUNTER — Encounter (HOSPITAL_COMMUNITY): Payer: Self-pay | Admitting: Anesthesiology

## 2023-09-08 ENCOUNTER — Other Ambulatory Visit: Payer: Self-pay

## 2023-09-08 ENCOUNTER — Encounter (HOSPITAL_COMMUNITY): Payer: Self-pay | Admitting: Gastroenterology

## 2023-09-08 DIAGNOSIS — I2510 Atherosclerotic heart disease of native coronary artery without angina pectoris: Secondary | ICD-10-CM

## 2023-09-08 DIAGNOSIS — R933 Abnormal findings on diagnostic imaging of other parts of digestive tract: Secondary | ICD-10-CM | POA: Diagnosis present

## 2023-09-08 DIAGNOSIS — N1831 Chronic kidney disease, stage 3a: Secondary | ICD-10-CM

## 2023-09-08 DIAGNOSIS — K862 Cyst of pancreas: Secondary | ICD-10-CM

## 2023-09-08 DIAGNOSIS — K219 Gastro-esophageal reflux disease without esophagitis: Secondary | ICD-10-CM | POA: Diagnosis not present

## 2023-09-08 DIAGNOSIS — I252 Old myocardial infarction: Secondary | ICD-10-CM | POA: Insufficient documentation

## 2023-09-08 DIAGNOSIS — I129 Hypertensive chronic kidney disease with stage 1 through stage 4 chronic kidney disease, or unspecified chronic kidney disease: Secondary | ICD-10-CM | POA: Diagnosis not present

## 2023-09-08 DIAGNOSIS — Z955 Presence of coronary angioplasty implant and graft: Secondary | ICD-10-CM | POA: Insufficient documentation

## 2023-09-08 DIAGNOSIS — K5710 Diverticulosis of small intestine without perforation or abscess without bleeding: Secondary | ICD-10-CM | POA: Insufficient documentation

## 2023-09-08 DIAGNOSIS — I10 Essential (primary) hypertension: Secondary | ICD-10-CM | POA: Diagnosis not present

## 2023-09-08 DIAGNOSIS — Z87891 Personal history of nicotine dependence: Secondary | ICD-10-CM | POA: Diagnosis not present

## 2023-09-08 HISTORY — PX: ESOPHAGOGASTRODUODENOSCOPY: SHX5428

## 2023-09-08 HISTORY — PX: FINE NEEDLE ASPIRATION: SHX6590

## 2023-09-08 HISTORY — PX: EUS: SHX5427

## 2023-09-08 SURGERY — ULTRASOUND, UPPER GI TRACT, ENDOSCOPIC
Anesthesia: Monitor Anesthesia Care

## 2023-09-08 MED ORDER — OXYCODONE HCL 5 MG PO TABS: 5.0000 mg | ORAL_TABLET | Freq: Once | ORAL | Status: DC | PRN

## 2023-09-08 MED ORDER — SODIUM CHLORIDE 0.9 % IV SOLN: INTRAVENOUS | Status: DC

## 2023-09-08 MED ORDER — ONDANSETRON HCL 4 MG/2ML IJ SOLN: 4.0000 mg | Freq: Once | INTRAMUSCULAR | Status: DC | PRN

## 2023-09-08 MED ORDER — MEPERIDINE HCL 50 MG/ML IJ SOLN: 6.2500 mg | INTRAMUSCULAR | Status: DC | PRN

## 2023-09-08 MED ORDER — OXYCODONE HCL 5 MG/5ML PO SOLN
5.0000 mg | Freq: Once | ORAL | Status: DC | PRN
  Filled 2023-09-08: qty 5

## 2023-09-08 MED ORDER — LIDOCAINE 2% (20 MG/ML) 5 ML SYRINGE
INTRAMUSCULAR | Status: DC | PRN
Start: 2023-09-08 — End: 2023-09-08
  Administered 2023-09-08: 80 mg via INTRAVENOUS

## 2023-09-08 MED ORDER — CIPROFLOXACIN IN D5W 400 MG/200ML IV SOLN
INTRAVENOUS | Status: AC
  Filled 2023-09-08: qty 200

## 2023-09-08 MED ORDER — PROPOFOL 500 MG/50ML IV EMUL
INTRAVENOUS | Status: DC | PRN
  Administered 2023-09-08: 75 ug/kg/min via INTRAVENOUS

## 2023-09-08 MED ORDER — PROPOFOL 10 MG/ML IV BOLUS
INTRAVENOUS | Status: DC | PRN
  Administered 2023-09-08 (×2): 30 mg via INTRAVENOUS

## 2023-09-08 MED ORDER — FENTANYL CITRATE (PF) 100 MCG/2ML IJ SOLN: 25.0000 ug | INTRAMUSCULAR | Status: DC | PRN

## 2023-09-08 MED ORDER — CIPROFLOXACIN HCL 500 MG PO TABS
500.0000 mg | ORAL_TABLET | Freq: Two times a day (BID) | ORAL | 0 refills | Status: DC
  Filled 2023-09-08: qty 6, 3d supply, fill #0

## 2023-09-08 MED ORDER — CIPROFLOXACIN IN D5W 400 MG/200ML IV SOLN
INTRAVENOUS | Status: DC | PRN
  Administered 2023-09-08: 400 mg via INTRAVENOUS

## 2023-09-08 NOTE — Transfer of Care (Signed)
 Immediate Anesthesia Transfer of Care Note  Patient: Kathryn [name]  Procedure(s) Performed: ULTRASOUND, UPPER GI TRACT, ENDOSCOPIC FINE NEEDLE ASPIRATION  Patient Location: PACU  Anesthesia Type:General  Level of Consciousness: awake and alert   Airway & Oxygen  Therapy: Patient Spontanous Breathing and Patient connected to nasal cannula oxygen   Post-op Assessment: Report given to RN and Post -op Vital signs reviewed and stable  Post vital signs: Reviewed and stable  Last Vitals:  Vitals Value Taken Time  BP 129/47 09/08/23 09:40  Temp 36.4 C 09/08/23 09:39  Pulse 55 09/08/23 09:47  Resp 12 09/08/23 09:47  SpO2 97 % 09/08/23 09:47  Vitals shown include unfiled device data.  Last Pain:  Vitals:   09/08/23 0939  TempSrc: Temporal  PainSc: 0-No pain      Patients Stated Pain Goal: 0 (09/08/23 0803)  Complications: No notable events documented.

## 2023-09-08 NOTE — H&P (Signed)
 Kathryn [name] HPI: This 83 year old black female presents to the office for a 2 year follow up for a MRCP. She had an MRCP on 6/18/202 that revealed a 2.6 cm cystic lesion in the neck of the pancreas with no ductal dilation and a follow-up MRCP was recommended in 2 years to document stability She has 1 BM per day with no obvious blood or mucus in the stool. She reports she had a stomach virus 2 weeks ago. She had diarrhea and nausea but that has since resolved. She has good appetite and her weight has been stable. She denies having any complaints of abdominal pain, vomiting, acid reflux, dysphagia or odynophagia. She denies having a family history of celiac sprue or IBD. Her father died of colon cancer in his 93's. Her last colonoscopy done on 07/31/2015 revealed diverticulosis and a hyperplastic polyp was removed.  Past Medical History:  Diagnosis Date   Anxiety    takes Xanax  bid prn   Arthritis    Bruises easily    pt is on Plavix    Chronic back pain    buldging disc   Chronic neck pain    Coronary artery disease    1 stent    Dysphagia    Headache(784.0)    related to cervical issues   History of colonic polyps    Hyperlipidemia    takes Crestor  daily   Hypertension    takes Coreg  and Diovan  daily   Myocardial infarction (HCC) 2019   Osteoporosis    was taking shot 2xyr;but not taking anymore   Peripheral vascular disease (HCC)    Sciatica    Seasonal allergies    takes Zyrtec nightly    Past Surgical History:  Procedure Laterality Date   ABDOMINAL HYSTERECTOMY  70's   BUNIONECTOMY     left foot   CARDIAC CATHETERIZATION  2009/2011   1 stent placed   CATARACT EXTRACTION Bilateral    CHOLECYSTECTOMY  2009   COLONOSCOPY     ESOPHAGOGASTRODUODENOSCOPY     LEFT HEART CATH AND CORONARY ANGIOGRAPHY N/A 01/16/2018   Procedure: LEFT HEART CATH AND CORONARY ANGIOGRAPHY;  Surgeon: Elmira Newman PARAS, MD;  Location: [facility];  Service: Cardiovascular;   Laterality: N/A;   LUMBAR LAMINECTOMY/DECOMPRESSION MICRODISCECTOMY N/A 02/20/2023   Procedure: LUMBAR LAMINECTOMY/DECOMPRESSION MICRODISCECTOMY 1 LEVEL L4-L5;  Surgeon: Lanis Pupa, MD;  Location: [facility] OR;  Service: Neurosurgery;  Laterality: N/A;   TONSILLECTOMY     at age 70    Family History  Problem Relation Age of Onset   Heart disease Mother        Heart Dissease before age 39   Heart attack Mother    Cancer Father    Leukemia Sister    Cancer Sister    Lupus Sister    Cancer Brother    Heart disease Brother        Amputation   Heart attack Brother    Heart disease Brother    Heart attack Brother    Dementia Other    Healthy Son    Healthy Daughter    Healthy Daughter    Healthy Daughter    Anesthesia problems Neg Hx    Hypotension Neg Hx    Malignant hyperthermia Neg Hx    Pseudochol deficiency Neg Hx     Social History:  reports that she quit smoking about 54 years ago. Her smoking use included cigarettes. She started smoking about 59 years ago. She has a 1.3  pack-year smoking history. She has never been exposed to tobacco smoke. She has never used smokeless tobacco. She reports that she does not drink alcohol and does not use drugs.  Allergies:  Allergies  Allergen Reactions   Codeine Hypertension   Ibuprofen Other (See Comments)    Makes sick on stomach    Medications: Scheduled: Continuous:  sodium chloride  20 mL/hr at 09/08/23 0840    No results found for this or any previous visit (from the past 24 hours).   No results found.  ROS:  As stated above in the HPI otherwise negative.  Temperature 98.4 F (36.9 C), temperature source Temporal, resp. rate 13, height 5' 3 (1.6 m), weight 87 kg, SpO2 99%.    PE: Gen: NAD, Alert and Oriented HEENT:  [place]/AT, EOMI Neck: Supple, no LAD Lungs: CTA Bilaterally CV: RRR without M/G/R ABD: Soft, NTND, +BS Ext: No C/C/E  Assessment/Plan: 1) Pancreatic cyst - There was a mild enlargement up to 3 cm.  An  EUS with FNA of the cyst will be performed.  Tajuan Dufault D 09/08/2023, 8:55 AM

## 2023-09-08 NOTE — Discharge Instructions (Signed)

## 2023-09-08 NOTE — Op Note (Signed)
 [facility] Patient Name: Kathryn [name] Procedure Date: 09/08/2023 MRN: [id number] Attending MD: Belvie Just , MD, [id number] Date of Birth: [birth year] CSN: [id number] Age: 83 Admit Type: Outpatient Procedure:                Upper EUS Indications:              Pancreatic cyst on MRI Providers:                Belvie Just, MD, Gregoria Pierce, RN, Farris Southgate, Technician Referring MD:              Medicines:                Propofol  per Anesthesia, Cipro 400 mg IV x 1 Complications:            No immediate complications. Estimated Blood Loss:     Estimated blood loss: none. Procedure:                Pre-Anesthesia Assessment:                           - Prior to the procedure, a History and Physical                            was performed, and patient medications and                            allergies were reviewed. The patient's tolerance of                            previous anesthesia was also reviewed. The risks                            and benefits of the procedure and the sedation                            options and risks were discussed with the patient.                            All questions were answered, and informed consent                            was obtained. Prior Anticoagulants: The patient has                            taken no anticoagulant or antiplatelet agents. ASA                            Grade Assessment: II - A patient with mild systemic                            disease. After reviewing the risks and benefits,  the patient was deemed in satisfactory condition to                            undergo the procedure.                           - Sedation was administered by an anesthesia                            professional. Deep sedation was attained.                           After obtaining informed consent, the endoscope was                            passed under direct  vision. Throughout the                            procedure, the patient's blood pressure, pulse, and                            oxygen  saturations were monitored continuously. The                            [id number] ([id number] Olympus linear ultrasound scope                            was introduced through the mouth, and advanced to                            the second part of duodenum. The upper EUS was                            accomplished without difficulty. The patient                            tolerated the procedure well. Scope In: Scope Out: Findings:      ENDOSCOPIC FINDING: :      The examined esophagus was endoscopically normal.      The entire examined stomach was endoscopically normal.      A medium non-bleeding diverticulum was found in the second portion of       the duodenum.      ENDOSONOGRAPHIC FINDING: :      An anechoic lesion suggestive of a cyst was identified in the genu of       the pancreas. It communicates with the pancreatic duct. The lesion       measured 30 mm by 20 mm in maximal cross-sectional diameter. There was a       single compartment without septae. The outer wall of the lesion was       thin. There was no associated mass. There was internal debris within the       fluid-filled cavity. Diagnostic needle aspiration for fluid was       performed. Color Doppler imaging was utilized prior to needle puncture       to confirm a lack  of significant vascular structures within the needle       path. One pass was made with the 22 gauge needle using a transgastric       approach. A stylet was used. The amount of fluid collected was 6 mL. The       fluid was clear, white, thin and watery. Sample(s) were sent for amylase       concentration, cytology and CEA.      The large cyst was easily identified. It measured approximately 3 cm x 2       cm. Careful examination for any PD ductal communication showed that       there was communication with main PD at the  neck of the pancreas,       suggesting a main duct IPMN. In one view a hyperechoic signal was       obtained. This did not appear to be a nodule or a mass. It may be some       internal debris. One pass with the 22 gauge FNA needle was made and       approximately 6 ml of thin, watery, clear fluid was obtained. The cyst       was completely collapsed. The PD at the neck of the pancreas measured 4       mm, but at the head/ampulla the PD was 3 mm. Distally in the body and       tail of the pancreas it was approximately 2 mm and 1 mm, respectively.       The pancreatic parenchyma was normal and not atrophied for her age. No       other cysts were found. The CBD was normal in the ampulla, but it was       dilated proximally at 1 cm, which is consistent for a       post-cholecystectomy state. A duodenal diverticulum was noted in the       distal portion of D2. Impression:               - Normal esophagus.                           - Normal stomach.                           - Non-bleeding duodenal diverticulum.                           - A cystic lesion was seen in the genu of the                            pancreas. Fine needle aspiration for fluid                            performed. Moderate Sedation:      Not Applicable - Patient had care per Anesthesia. Recommendation:           - Patient has a contact number available for                            emergencies. The signs and symptoms of potential  delayed complications were discussed with the                            patient. Return to normal activities tomorrow.                            Written discharge instructions were provided to the                            patient.                           - Resume regular diet.                           - Continue present medications.                           - Await cytology results and CEA, glucose, and                            amylase. Procedure  Code(s):        --- Professional ---                           [phone], Esophagogastroduodenoscopy, flexible,                            transoral; with transendoscopic ultrasound-guided                            intramural or transmural fine needle                            aspiration/biopsy(s), (includes endoscopic                            ultrasound examination limited to the esophagus,                            stomach or duodenum, and adjacent structures) Diagnosis Code(s):        --- Professional ---                           X13.7, Cyst of pancreas                           K57.10, Diverticulosis of small intestine without                            perforation or abscess without bleeding CPT copyright 2022 [employer]. All rights reserved. The codes documented in this report are preliminary and upon coder review may  be revised to meet current compliance requirements. Belvie Just, MD Belvie Just, MD 09/08/2023 9:49:29 AM This report has been signed electronically. Number of Addenda: 0

## 2023-09-08 NOTE — Anesthesia Postprocedure Evaluation (Signed)
 Anesthesia Post Note  Patient: Kathryn [name]  Procedure(s) Performed: ULTRASOUND, UPPER GI TRACT, ENDOSCOPIC FINE NEEDLE ASPIRATION     Patient location during evaluation: PACU Anesthesia Type: MAC Level of consciousness: awake and alert Pain management: pain level controlled Vital Signs Assessment: post-procedure vital signs reviewed and stable Respiratory status: spontaneous breathing, nonlabored ventilation, respiratory function stable and patient connected to nasal cannula oxygen  Cardiovascular status: stable and blood pressure returned to baseline Postop Assessment: no apparent nausea or vomiting Anesthetic complications: no   No notable events documented.  Last Vitals:  Vitals:   09/08/23 0950 09/08/23 1000  BP: (!) 124/90 (!) 157/77  Pulse: (!) 55 (!) 59  Resp: 14 11  Temp:    SpO2: 97% 99%    Last Pain:  Vitals:   09/08/23 1000  TempSrc:   PainSc: 0-No pain                 Jaspal Pultz

## 2023-09-10 ENCOUNTER — Encounter (HOSPITAL_COMMUNITY): Payer: Self-pay | Admitting: Gastroenterology

## 2023-09-11 ENCOUNTER — Ambulatory Visit: Payer: Self-pay | Admitting: Internal Medicine

## 2023-09-11 LAB — CYTOLOGY - NON PAP

## 2023-09-11 NOTE — Assessment & Plan Note (Signed)
 Chronic, controlled.  Regarding CKD, she is encouraged to stay well hydrated, avoid NSAIDs and keep BP controlled to prevent progression of CKD.  She will f/u in three to four months for re-evaluation.  - Continue with amlodipine  10mg  daily - Follow low sodium diet.  - Continue with carvedilol  12.5mg  twice daily - Continue with losartan  25mg  daily

## 2023-09-11 NOTE — Assessment & Plan Note (Signed)
 Mood improved with sertraline . - Continue sertraline  25 mg daily.

## 2023-09-11 NOTE — Assessment & Plan Note (Signed)
 Previous labs reviewed, her A1c has been elevated in the past. I will check an A1c today. Reminded to avoid refined sugars including sugary drinks/foods and processed meats including bacon, sausages and deli meats.

## 2023-09-11 NOTE — Assessment & Plan Note (Addendum)
 Incidental finding on MRI lumbar spine in 2021, has gradually increased in size.  - Proceed with scheduled endoscopic ultrasound and fine needle aspiration.

## 2023-09-11 NOTE — Assessment & Plan Note (Signed)
 She is encouraged to strive for BMI less than 30 to decrease cardiac risk. Advised to aim for at least 150 minutes of exercise per week.

## 2023-09-11 NOTE — Assessment & Plan Note (Signed)
 Chronic, LDL goal is less than 70.  She will continue with pravastatin  and ezetimibe .  - She has been on high intensity statin the past; however, had myalgias.  - Continue with ASA 81mg  and statin therapy - Follow heart healthy lifestyle.

## 2023-09-11 NOTE — Assessment & Plan Note (Addendum)
 Chronic, LDL goal is less than 70.  Last lipid panel results reviewed.  She has elevated cholesterol levels.  She will continue with pravastatin  and Zetia . - Follow heart healthy lifestyle.

## 2023-09-11 NOTE — Assessment & Plan Note (Signed)
 Pain managed with Tylenol , tramadol , and Lyrica . - Continue Tylenol  and tramadol  as needed. - Continue Lyrica  as prescribed. - Follow up with pain management as scheduled.

## 2023-09-11 NOTE — Assessment & Plan Note (Signed)
 Chronic, she has been evaluated by Ortho. Surgery has been suggested; however, she declines at this time.   - consider PT if her sx persist/worsen.

## 2023-09-12 ENCOUNTER — Encounter (HOSPITAL_COMMUNITY): Payer: Self-pay

## 2023-09-13 DIAGNOSIS — M19211 Secondary osteoarthritis, right shoulder: Secondary | ICD-10-CM | POA: Diagnosis not present

## 2023-09-20 ENCOUNTER — Encounter (HOSPITAL_COMMUNITY): Payer: Self-pay

## 2023-09-20 NOTE — Progress Notes (Signed)
 Office Visit Note  Patient: Kathryn [name]             Date of Birth: August 31, 1940           MRN: [id number]             PCP: Jarold Medici, MD Referring: Jarold Medici, MD Visit Date: 09/28/2023 Occupation: @GUAROCC @  Subjective:  Medication management  History of Present Illness: Kathryn [name] is a 83 y.o. female with osteoporosis, degenerative disc disease.  She returns today after her last visit in February 2025.  She has been on Prolia  injections for osteoporosis.  Her lower back pain is better.  She uses a walker or a cane as needed.  Today she comes without any assistive device.  She still takes Tylenol  and Tylenol  for pain management.      Activities of Daily Living:  Patient reports morning stiffness for less than 5 minutes.   Patient Reports nocturnal pain.  Difficulty dressing/grooming: Denies Difficulty climbing stairs: Reports Difficulty getting out of chair: Denies Difficulty using hands for taps, buttons, cutlery, and/or writing: Denies  Review of Systems  Constitutional:  Negative for fatigue.  HENT:  Negative for mouth sores and mouth dryness.   Eyes:  Negative for dryness.  Respiratory:  Negative for shortness of breath.   Cardiovascular:  Negative for chest pain and palpitations.  Gastrointestinal:  Negative for blood in stool, constipation and diarrhea.  Endocrine: Negative for increased urination.  Genitourinary:  Negative for involuntary urination.  Musculoskeletal:  Positive for myalgias, morning stiffness and myalgias. Negative for joint pain, gait problem, joint pain, joint swelling, muscle weakness and muscle tenderness.  Skin:  Positive for sensitivity to sunlight. Negative for color change, rash and hair loss.  Allergic/Immunologic: Negative for susceptible to infections.  Neurological:  Negative for dizziness and headaches.  Hematological:  Negative for swollen glands.  Psychiatric/Behavioral:  Negative for depressed mood and sleep  disturbance. The patient is not nervous/anxious.     PMFS History:  Patient Active Problem List   Diagnosis Date Noted   Adjustment disorder with mixed anxiety and depressed mood 09/06/2023   Right rotator cuff tendinitis 09/06/2023   Pancreatic cyst 07/12/2023   Indigestion 07/12/2023   Chronic right shoulder pain 07/12/2023   Encounter for general adult medical examination w/o abnormal findings 07/04/2023   Age related osteoporosis 04/10/2023   Viral upper respiratory tract infection 04/02/2023   Chronic pain syndrome 04/02/2023   Low back pain 02/20/2023   Protrusion of lumbar intervertebral disc 02/19/2023   Upper back pain 02/19/2023   History of CAD (coronary artery disease) 02/19/2023   Carotid stenosis 02/19/2023   Reactive airway disease 02/19/2023   GAD (generalized anxiety disorder) 02/19/2023   Sciatica    Rash 12/28/2022   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) 12/12/2022   Allergic rhinitis with postnasal drip 12/12/2022   Class 1 obesity due to excess calories with serious comorbidity and body mass index (BMI) of 33.0 to 33.9 in adult 12/12/2022   Acute bronchitis 12/12/2022   Immunization due 12/12/2022   COVID-19 09/05/2022   Change in bowel habit 04/21/2022   Diverticulosis of colon 04/21/2022   Dysphagia 04/21/2022   Family history of malignant neoplasm of digestive organs 04/21/2022   Class 3 severe obesity due to excess calories with serious comorbidity and body mass index (BMI) of 45.0 to 49.9 in adult 04/21/2022   Myositis 04/21/2022   Periumbilical pain  04/21/2022   History of colonic polyps 04/21/2022   Bilateral carotid bruits 08/10/2021   Grief 06/05/2021   Prediabetes 06/05/2021   Spinal stenosis of lumbosacral region 06/05/2021   Coronary artery disease of native artery of native heart with stable angina pectoris (HCC) 06/07/2018   Anxiety 01/18/2018   Hypertensive nephropathy  01/15/2018   CKD (chronic kidney disease) stage 2, GFR 60-89 ml/min 01/15/2018   Other abnormal glucose 01/15/2018   Neuralgia, 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   NSTEMI (non-ST elevated myocardial infarction) (HCC) 01/14/2018   Essential hypertension 01/14/2018   Hyperlipidemia 01/14/2018   Carotid stenosis, asymptomatic, bilateral 09/06/2012    Past Medical History:  Diagnosis Date   Anxiety    takes Xanax  bid prn   Arthritis    Bruises easily    pt is on Plavix    Chronic back pain    buldging disc   Chronic neck pain    Coronary artery disease    1 stent    Dysphagia    Headache(784.0)    related to cervical issues   History of colonic polyps    Hyperlipidemia    takes Crestor  daily   Hypertension    takes Coreg  and Diovan  daily   Myocardial infarction (HCC) 2019   Osteoporosis    was taking shot 2xyr;but not taking anymore   Peripheral vascular disease (HCC)    Sciatica    Seasonal allergies    takes Zyrtec nightly    Family History  Problem Relation Age of Onset   Heart disease Mother        Heart Dissease before age 47   Heart attack Mother    Cancer Father    Leukemia Sister    Cancer Sister    Lupus Sister    Cancer Brother    Heart disease Brother        Amputation   Heart attack Brother    Heart disease Brother    Heart attack Brother    Dementia Other    Healthy Son    Healthy Daughter    Healthy Daughter    Healthy Daughter    Anesthesia problems Neg Hx    Hypotension Neg Hx    Malignant hyperthermia Neg Hx    Pseudochol deficiency Neg Hx    Past Surgical History:  Procedure Laterality Date   ABDOMINAL HYSTERECTOMY  70's   BUNIONECTOMY     left foot   CARDIAC CATHETERIZATION  2009/2011   1 stent placed   CATARACT EXTRACTION Bilateral    CHOLECYSTECTOMY  2009   COLONOSCOPY     ESOPHAGOGASTRODUODENOSCOPY     ESOPHAGOGASTRODUODENOSCOPY N/A 09/08/2023   Procedure: EGD (ESOPHAGOGASTRODUODENOSCOPY);  Surgeon: Rollin Dover, MD;  Location: [facility];  Service: Gastroenterology;  Laterality: N/A;   EUS N/A 09/08/2023   Procedure: ULTRASOUND, UPPER GI TRACT, ENDOSCOPIC;  Surgeon: Rollin Dover, MD;  Location: [facility];  Service: Gastroenterology;  Laterality: N/A;   FINE NEEDLE ASPIRATION  09/08/2023   Procedure: FINE NEEDLE ASPIRATION;  Surgeon: Rollin Dover, MD;  Location: [facility];  Service: Gastroenterology;;   LEFT HEART CATH AND CORONARY ANGIOGRAPHY N/A 01/16/2018   Procedure: LEFT HEART CATH AND CORONARY ANGIOGRAPHY;  Surgeon: Elmira Newman PARAS, MD;  Location: [facility];  Service: Cardiovascular;  Laterality: N/A;   LUMBAR LAMINECTOMY/DECOMPRESSION MICRODISCECTOMY N/A 02/20/2023   Procedure: LUMBAR LAMINECTOMY/DECOMPRESSION MICRODISCECTOMY 1 LEVEL L4-L5;  Surgeon: Lanis Pupa, MD;  Location: [facility];  Service: Neurosurgery;  Laterality: N/A;   TONSILLECTOMY     at  age 31   Social History   Social History Narrative   Not on file   Immunization History  Administered Date(s) Administered   DTaP 10/25/2012   Fluad Quad(high Dose 65+) 12/03/2019, 11/19/2020, 03/23/2022   Influenza, High Dose Seasonal PF 12/21/2017, 11/15/2018   Moderna Covid-19 Fall Seasonal Vaccine 62yrs & older 12/03/2021   Moderna Covid-19 Vaccine Bivalent Booster 61yrs & up 11/27/2020   PFIZER(Purple Top)SARS-COV-2 Vaccination 03/01/2019, 03/22/2019, 11/13/2019, 06/09/2020   Pneumococcal Conjugate-13 07/15/2013   Pneumococcal Polysaccharide-23 03/12/2012   Tdap 12/12/2022   Zoster Recombinant(Shingrix) 11/01/2018, 02/06/2019     Objective: Vital Signs: BP 107/67 (BP Location: Left Arm, Patient Position: Sitting, Cuff Size: Normal)   Pulse 65   Resp 14   Ht 5' 4 (1.626 m)   Wt 190 lb (86.2 kg)   BMI 32.61 kg/m    Physical Exam Vitals and nursing note reviewed.  Constitutional:      Appearance: She is well-developed.  HENT:     Head: Normocephalic and atraumatic.  Eyes:     Conjunctiva/sclera: Conjunctivae normal.   Cardiovascular:     Rate and Rhythm: Normal rate and regular rhythm.     Heart sounds: Normal heart sounds.  Pulmonary:     Effort: Pulmonary effort is normal.     Breath sounds: Normal breath sounds.  Abdominal:     General: Bowel sounds are normal.     Palpations: Abdomen is soft.  Musculoskeletal:     Cervical back: Normal range of motion.  Lymphadenopathy:     Cervical: No cervical adenopathy.  Skin:    General: Skin is warm and dry.     Capillary Refill: Capillary refill takes less than 2 seconds.  Neurological:     Mental Status: She is alert and oriented to person, place, and time.  Psychiatric:        Behavior: Behavior normal.      Musculoskeletal Exam: Patient was examined in the seated position.  She had limited range of motion of the cervical spine.  Thoracic kyphosis was noted.  She had no tenderness over thoracic or lumbar spine.  Shoulders, elbows and wrist joints were in good range of motion.  She had discomfort range of motion of her right shoulder joint.  She had bilateral PIP and DIP thickening with no synovitis.  Hip joints could not be assessed.  Knee joints were in good range of motion.  There was no tenderness over her ankles or MTPs.  CDAI Exam: CDAI Score: -- Patient Global: --; Provider Global: -- Swollen: --; Tender: -- Joint Exam 09/28/2023   No joint exam has been documented for this visit   There is currently no information documented on the homunculus. Go to the Rheumatology activity and complete the homunculus joint exam.  Investigation: No additional findings.  Imaging: No results found.  Recent Labs: Lab Results  Component Value Date   WBC 8.2 07/04/2023   HGB 12.3 07/04/2023   PLT 506 (H) 07/04/2023   NA 142 09/06/2023   K 4.7 09/06/2023   CL 105 09/06/2023   CO2 22 09/06/2023   GLUCOSE 99 09/06/2023   BUN 11 09/06/2023   CREATININE 1.17 (H) 09/06/2023   BILITOT 0.3 09/06/2023   ALKPHOS 74 09/06/2023   AST 17 09/06/2023    ALT 7 09/06/2023   PROT 6.3 09/06/2023   ALBUMIN  4.1 09/06/2023   CALCIUM  9.4 09/06/2023   GFRAA 46 (L) 12/30/2019     Speciality Comments: Prolia  July 2024, March 2025  Procedures:  No procedures performed Allergies: Codeine and Ibuprofen   Assessment / Plan:     Visit Diagnoses: Localized osteoporosis without current pathological fracture - DEXA 05/20/22: The BMD measured at Femur Neck Right is 0.639 g/cm2 with a T-score of -2.9.  Patient was nave to osteoporosis treatment.  Patient was a started on Prolia  in July 2024.  She could not take bisphosphonates due to reflux and chronic kidney disease.  She had her last Prolia  injection in March 2025.  We scheduled her for the next Prolia  injection.  Patient denies any side effects from Prolia .  She has been taking calcium  and vitamin D  on a regular basis.  Medication monitoring encounter - Prolia  administered on 04/25/2023.  September 06, 2023 creatinine 1.17, AST ALT normal, calcium  9.4.  Chronic right shoulder pain-she has been having some discomfort in her right shoulder.  She states she has rotator cuff tendinopathy.  She is followed at [facility].  Vitamin D  deficiency - March 30, 2023 vitamin D  53.  History of gastroesophageal reflux (GERD)-she takes Pepcid  as needed.  Stage 3b chronic kidney disease (HCC)-creatinine is elevated to 1.17 which is stable.  Spinal stenosis of lumbosacral region - Previously under the care of Dr. Loyde sciatica1/16/25: LUMBAR LAMINECTOMY/DECOMPRESSION MICRODISCECTOMY 1 LEVEL L4-L5 with Lanis Pupa, MD.  Patient states her lower back pain is gradually improving.  She has been walking without a walker or a cane.  She states she sometimes uses assistive devices as needed.  Other medical problems are listed as follows:  Carotid stenosis, asymptomatic, bilateral  Coronary artery disease of native artery of native heart with stable angina pectoris (HCC)  Essential  hypertension-pressure was normal at 107/67.  Diverticulosis of colon  NSTEMI (non-ST elevated myocardial infarction) (HCC)  Neuralgia, postherpetic  Hypertensive nephropathy  Bilateral carotid bruits  Pure hypercholesterolemia  Class 3 severe obesity due to excess calories with serious comorbidity and body mass index (BMI) of 45.0 to 49.9 in adult  Prediabetes  Orders: No orders of the defined types were placed in this encounter.  No orders of the defined types were placed in this encounter.   Follow-Up Instructions: Return in about 6 months (around 03/30/2024) for Osteoporosis, Osteoarthritis.   Maya Nash, MD  Note - This record has been created using Animal nutritionist.  Chart creation errors have been sought, but may not always  have been located. Such creation errors do not reflect on  the standard of medical care.

## 2023-09-22 DIAGNOSIS — M5416 Radiculopathy, lumbar region: Secondary | ICD-10-CM | POA: Diagnosis not present

## 2023-09-22 DIAGNOSIS — M4316 Spondylolisthesis, lumbar region: Secondary | ICD-10-CM | POA: Diagnosis not present

## 2023-09-28 ENCOUNTER — Encounter: Payer: Self-pay | Admitting: Rheumatology

## 2023-09-28 ENCOUNTER — Ambulatory Visit: Payer: Medicare HMO | Attending: Rheumatology | Admitting: Rheumatology

## 2023-09-28 VITALS — BP 107/67 | HR 65 | Resp 14 | Ht 64.0 in | Wt 190.0 lb

## 2023-09-28 DIAGNOSIS — I25118 Atherosclerotic heart disease of native coronary artery with other forms of angina pectoris: Secondary | ICD-10-CM

## 2023-09-28 DIAGNOSIS — E7800 Pure hypercholesterolemia, unspecified: Secondary | ICD-10-CM

## 2023-09-28 DIAGNOSIS — K5730 Diverticulosis of large intestine without perforation or abscess without bleeding: Secondary | ICD-10-CM

## 2023-09-28 DIAGNOSIS — B0229 Other postherpetic nervous system involvement: Secondary | ICD-10-CM

## 2023-09-28 DIAGNOSIS — R7303 Prediabetes: Secondary | ICD-10-CM

## 2023-09-28 DIAGNOSIS — Z8719 Personal history of other diseases of the digestive system: Secondary | ICD-10-CM | POA: Diagnosis not present

## 2023-09-28 DIAGNOSIS — I6523 Occlusion and stenosis of bilateral carotid arteries: Secondary | ICD-10-CM | POA: Diagnosis not present

## 2023-09-28 DIAGNOSIS — Z5181 Encounter for therapeutic drug level monitoring: Secondary | ICD-10-CM

## 2023-09-28 DIAGNOSIS — M25511 Pain in right shoulder: Secondary | ICD-10-CM | POA: Diagnosis not present

## 2023-09-28 DIAGNOSIS — R0989 Other specified symptoms and signs involving the circulatory and respiratory systems: Secondary | ICD-10-CM

## 2023-09-28 DIAGNOSIS — M816 Localized osteoporosis [Lequesne]: Secondary | ICD-10-CM

## 2023-09-28 DIAGNOSIS — N1832 Chronic kidney disease, stage 3b: Secondary | ICD-10-CM | POA: Diagnosis not present

## 2023-09-28 DIAGNOSIS — I214 Non-ST elevation (NSTEMI) myocardial infarction: Secondary | ICD-10-CM

## 2023-09-28 DIAGNOSIS — E559 Vitamin D deficiency, unspecified: Secondary | ICD-10-CM | POA: Diagnosis not present

## 2023-09-28 DIAGNOSIS — Z6841 Body Mass Index (BMI) 40.0 and over, adult: Secondary | ICD-10-CM

## 2023-09-28 DIAGNOSIS — E66813 Obesity, class 3: Secondary | ICD-10-CM

## 2023-09-28 DIAGNOSIS — I129 Hypertensive chronic kidney disease with stage 1 through stage 4 chronic kidney disease, or unspecified chronic kidney disease: Secondary | ICD-10-CM

## 2023-09-28 DIAGNOSIS — I10 Essential (primary) hypertension: Secondary | ICD-10-CM

## 2023-09-28 DIAGNOSIS — M4807 Spinal stenosis, lumbosacral region: Secondary | ICD-10-CM

## 2023-09-28 DIAGNOSIS — G8929 Other chronic pain: Secondary | ICD-10-CM

## 2023-09-29 ENCOUNTER — Telehealth: Payer: Self-pay | Admitting: Pharmacist

## 2023-09-29 NOTE — Telephone Encounter (Addendum)
 CMP on 09/06/2023 - calcium  wnl  Prolia  scheduled for 10/27/2023 at [facility]  ----- Message from [facility] Mims G sent at 09/28/2023  2:03 PM EDT ----- Prolia  is due 10/2023 per Dr. Dolphus. Thanks!

## 2023-10-03 DIAGNOSIS — H01001 Unspecified blepharitis right upper eyelid: Secondary | ICD-10-CM | POA: Diagnosis not present

## 2023-10-03 DIAGNOSIS — H524 Presbyopia: Secondary | ICD-10-CM | POA: Diagnosis not present

## 2023-10-03 DIAGNOSIS — H11823 Conjunctivochalasis, bilateral: Secondary | ICD-10-CM | POA: Diagnosis not present

## 2023-10-03 DIAGNOSIS — H01004 Unspecified blepharitis left upper eyelid: Secondary | ICD-10-CM | POA: Diagnosis not present

## 2023-10-03 DIAGNOSIS — H5213 Myopia, bilateral: Secondary | ICD-10-CM | POA: Diagnosis not present

## 2023-10-03 DIAGNOSIS — H01005 Unspecified blepharitis left lower eyelid: Secondary | ICD-10-CM | POA: Diagnosis not present

## 2023-10-03 DIAGNOSIS — Z961 Presence of intraocular lens: Secondary | ICD-10-CM | POA: Diagnosis not present

## 2023-10-03 DIAGNOSIS — H2640 Unspecified secondary cataract: Secondary | ICD-10-CM | POA: Diagnosis not present

## 2023-10-03 DIAGNOSIS — H16223 Keratoconjunctivitis sicca, not specified as Sjogren's, bilateral: Secondary | ICD-10-CM | POA: Diagnosis not present

## 2023-10-03 DIAGNOSIS — H01002 Unspecified blepharitis right lower eyelid: Secondary | ICD-10-CM | POA: Diagnosis not present

## 2023-10-04 DIAGNOSIS — M5416 Radiculopathy, lumbar region: Secondary | ICD-10-CM | POA: Diagnosis not present

## 2023-10-07 ENCOUNTER — Other Ambulatory Visit: Payer: Self-pay | Admitting: Internal Medicine

## 2023-10-07 DIAGNOSIS — I1310 Hypertensive heart and chronic kidney disease without heart failure, with stage 1 through stage 4 chronic kidney disease, or unspecified chronic kidney disease: Secondary | ICD-10-CM

## 2023-10-10 ENCOUNTER — Other Ambulatory Visit: Payer: Self-pay | Admitting: Cardiology

## 2023-10-10 DIAGNOSIS — I25118 Atherosclerotic heart disease of native coronary artery with other forms of angina pectoris: Secondary | ICD-10-CM

## 2023-10-27 ENCOUNTER — Ambulatory Visit: Attending: Cardiology | Admitting: Cardiology

## 2023-10-27 ENCOUNTER — Ambulatory Visit

## 2023-10-27 ENCOUNTER — Encounter: Payer: Self-pay | Admitting: Cardiology

## 2023-10-27 VITALS — BP 117/75 | HR 74 | Temp 98.9°F | Resp 20 | Ht 65.0 in | Wt 184.6 lb

## 2023-10-27 VITALS — BP 110/63 | HR 76 | Resp 16 | Ht 64.0 in | Wt 184.2 lb

## 2023-10-27 DIAGNOSIS — E782 Mixed hyperlipidemia: Secondary | ICD-10-CM

## 2023-10-27 DIAGNOSIS — I10 Essential (primary) hypertension: Secondary | ICD-10-CM | POA: Diagnosis not present

## 2023-10-27 DIAGNOSIS — I6523 Occlusion and stenosis of bilateral carotid arteries: Secondary | ICD-10-CM | POA: Diagnosis not present

## 2023-10-27 DIAGNOSIS — M810 Age-related osteoporosis without current pathological fracture: Secondary | ICD-10-CM

## 2023-10-27 DIAGNOSIS — I2510 Atherosclerotic heart disease of native coronary artery without angina pectoris: Secondary | ICD-10-CM

## 2023-10-27 DIAGNOSIS — I350 Nonrheumatic aortic (valve) stenosis: Secondary | ICD-10-CM | POA: Diagnosis not present

## 2023-10-27 MED ORDER — DENOSUMAB 60 MG/ML ~~LOC~~ SOSY
60.0000 mg | PREFILLED_SYRINGE | Freq: Once | SUBCUTANEOUS | Status: AC
  Administered 2023-10-27: 60 mg via SUBCUTANEOUS
  Filled 2023-10-27: qty 1

## 2023-10-27 NOTE — Progress Notes (Signed)
 Diagnosis: Osteoporosis  Provider:  Chilton Greathouse MD  Procedure: Injection  Prolia (Denosumab), Dose: 60 mg, Site: subcutaneous, Number of injections: 1  Injection Site(s): Left arm  Post Care:  left arm injection  Discharge: Condition: Good, Destination: Home . AVS Provided  Performed by:  Rico Ala, LPN

## 2023-10-27 NOTE — Patient Instructions (Signed)
 Medication Instructions:  No medication changes were made at this visit. Continue current regimen.   *If you need a refill on your cardiac medications before your next appointment, please call your pharmacy*  Lab Work: NONE  If you have labs (blood work) drawn today and your tests are completely normal, you will receive your results only by: MyChart Message (if you have MyChart) OR A paper copy in the mail If you have any lab test that is abnormal or we need to change your treatment, we will call you to review the results.  Testing/Procedures: Carotid Duplex Your physician has requested that you have a carotid duplex. This test is an ultrasound of the carotid arteries in your neck. It looks at blood flow through these arteries that supply the brain with blood. Allow one hour for this exam. There are no restrictions or special instructions.   Follow-Up: At [facility], you and your health needs are our priority.  As part of our continuing mission to provide you with exceptional heart care, our providers are all part of one team.  This team includes your primary Cardiologist (physician) and Advanced Practice Providers or APPs (Physician Assistants and Nurse Practitioners) who all work together to provide you with the care you need, when you need it.  Your next appointment:   1 Year   Provider:   Gordy Bergamo, MD    We recommend signing up for the patient portal called MyChart.  Sign up information is provided on this After Visit Summary.  MyChart is used to connect with patients for Virtual Visits (Telemedicine).  Patients are able to view lab/test results, encounter notes, upcoming appointments, etc.  Non-urgent messages can be sent to your provider as well.   To learn more about what you can do with MyChart, go to [email or web address].

## 2023-10-27 NOTE — Progress Notes (Unsigned)
 Cardiology Office Note:  .   Date:  10/28/2023  ID:  Kathryn [name], DOB [birth year], MRN [id number] PCP: Jarold Medici, MD  [facility] Providers Cardiologist:  Gordy Bergamo, MD   History of Present Illness: .   Kathryn [name] is a 83 y.o. AAF with CAD s/p PCI to LCx 2009, patent stent by angiography in 2019 and otherwise normal coronary arteries, mild aortic stenosis per echocardiogram on 08/28/2020 with normal LVEF, hypertension, statin intolerant hyperlipidemia but tolerating pravastatin  and Zetia , CKD stage 3 and mild bilateral asymptomatic carotid stenosis.  She has no specific complaints today.  Cardiac Studies relevent.    Echocardiogram 08/28/2020: Left ventricle cavity is normal in size. Severe concentric hypertrophy of the left ventricle. Normal global wall motion. Normal LV systolic function with visual EF 55-60%. Doppler evidence of grade I (impaired) diastolic dysfunction, normal LAP. Left atrial cavity is mildly dilated. Trileaflet aortic valve with mild calcification.  Mild aortic stenosis. Vmax 2.1 m/sec, mean PG 10 mmHg, AVA 1.6 cm2 by continuity equation. Moderate (Grade II) aortic regurgitation. Moderate (Grade II) mitral regurgitation. Mildly restricted mitral valve leaflets. Moderate tricuspid regurgitation. Estimated pulmonary artery systolic pressure 39 mmHg.  Carotid artery duplex 04/13/2022: Duplex suggests stenosis in the right internal carotid artery (16-49%). <50% stenosis in the right external carotid artery. Duplex suggests stenosis in the left internal carotid artery (1-15%). <50% stenosis in the left common carotid artery and left external carotid artery. Antegrade right vertebral artery flow.  Antegrade left vertebral artery flow.   Coronary Angiogram 01/16/18:  Patent Sypher stent in Cx, 2.75 x 15 mm DES placed on 01/22/08. Normal LVEF. NO other significant CAD.       Discussed the use of AI scribe software for clinical note transcription  with the patient, who gave verbal consent to proceed.  History of Present Illness Kathryn [name] is an 83 year old female with coronary artery disease who presents for a cardiovascular follow-up.  She has coronary artery disease with stents and no recent chest pain. Her medications include baby aspirin , pravastatin , and Zetia  for cholesterol management. Blood pressure is controlled with amlodipine , carvedilol , and losartan . Mild aortic stenosis was noted on an echocardiogram in 2022. Carotid artery blockage is 40-50% on the right and 15-20% on the left. Kidney function is stable despite mild kidney disease. A1c is 5.7, indicating good blood sugar control. She maintains an active lifestyle.   Labs   Lab Results  Component Value Date   CHOL 143 09/06/2023   HDL 59 09/06/2023   LDLCALC 70 09/06/2023   TRIG 70 09/06/2023   CHOLHDL 2.4 09/06/2023   No results found for: LIPOA  Recent Labs    02/20/23 1058 02/21/23 0608 02/22/23 0607 03/30/23 1312 07/04/23 1222 09/06/23 1046  NA 138 139 144 144 142 142  K 3.8 4.0 3.8 4.1 4.3 4.7  CL 104 104 104 106 103 105  CO2 23 25 25 21 21 22   GLUCOSE 101* 147* 109* 85 79 99  BUN 14 14 25* 14 12 11   CREATININE 1.11* 1.01* 1.18* 1.14* 1.11* 1.17*  CALCIUM  8.4* 8.5* 8.6* 9.3 9.7 9.4  GFRNONAA 49* 55* 46*  --   --   --     Lab Results  Component Value Date   ALT 7 09/06/2023   AST 17 09/06/2023   ALKPHOS 74 09/06/2023   BILITOT 0.3 09/06/2023      Latest Ref Rng & Units 07/04/2023   12:22 PM 03/30/2023    1:12 PM  02/23/2023    5:04 AM  CBC  WBC 3.4 - 10.8 x10E3/uL 8.2  8.9  11.5   Hemoglobin 11.1 - 15.9 g/dL 87.6  88.5  9.0   Hematocrit 34.0 - 46.6 % 41.8  39.2  28.1   Platelets 150 - 450 x10E3/uL 506  428  301    Lab Results  Component Value Date   HGBA1C 5.7 (H) 09/06/2023    Lab Results  Component Value Date   TSH 1.190 12/12/2022     ROS  Review of Systems  Cardiovascular:  Negative for chest pain, dyspnea on  exertion and leg swelling.   Physical Exam:   VS:  BP 110/63 (BP Location: Left Arm, Patient Position: Sitting, Cuff Size: Large)   Pulse 76   Resp 16   Ht 5' 4 (1.626 m)   Wt 184 lb 3.2 oz (83.6 kg)   SpO2 100%   BMI 31.62 kg/m    Wt Readings from Last 3 Encounters:  10/27/23 184 lb 9.6 oz (83.7 kg)  10/27/23 184 lb 3.2 oz (83.6 kg)  09/28/23 190 lb (86.2 kg)    BP Readings from Last 3 Encounters:  10/27/23 117/75  10/27/23 110/63  09/28/23 107/67   Physical Exam Neck:     Vascular: No JVD.  Cardiovascular:     Rate and Rhythm: Normal rate and regular rhythm.     Pulses:          Carotid pulses are  on the right side with bruit and  on the left side with bruit.      Dorsalis pedis pulses are 1+ on the right side and 1+ on the left side.       Posterior tibial pulses are 0 on the right side and 0 on the left side.     Heart sounds: S1 normal and S2 normal. Murmur heard.     Early systolic murmur is present with a grade of 2/6 at the upper right sternal border radiating to the neck.     No gallop.  Pulmonary:     Effort: Pulmonary effort is normal.     Breath sounds: Normal breath sounds.  Abdominal:     General: Bowel sounds are normal.     Palpations: Abdomen is soft.  Musculoskeletal:     Right lower leg: No edema.     Left lower leg: No edema.    EKG:    EKG Interpretation Date/Time:  Friday October 27 2023 10:45:25 EDT Ventricular Rate:  70 PR Interval:  162 QRS Duration:  86 QT Interval:  390 QTC Calculation: 421 R Axis:   -13  Text Interpretation: EKG 10/27/2023: Normal sinus rhythm at rate of 70 bpm, poor R wave progression, probably normal variant.  No evidence of ischemia.  Compared to 08/21/2022, no significant change. Confirmed by Roshon Duell, Jagadeesh ([id number]) on 10/27/2023 11:06:26 AM    ASSESSMENT AND PLAN: .      ICD-10-CM   1. Coronary artery disease involving native coronary artery of native heart without angina pectoris  I25.10 EKG 12-Lead     2. Essential hypertension  I10     3. Carotid stenosis, asymptomatic, bilateral  I65.23 VAS US  CAROTID    4. Mixed hyperlipidemia  E78.2     5. Mild aortic stenosis  I35.0      Assessment & Plan Coronary artery disease with prior stent Currently stable with no chest pain and unchanged EKG from previous years. - Continue baby aspirin  81 mg daily.  Mild nonrheumatic aortic valve stenosis Stable with no change in murmur from last year. - Consider echocardiogram next year.  Bilateral carotid artery stenosis, stable Right side 40-50% stenosis, left side 15-20% stenosis. No immediate intervention required if remains stable. - Schedule carotid artery evaluation to ensure stability.  Essential hypertension, well controlled Well controlled with current medication regimen. - Continue amlodipine  10 mg once daily. - Continue carvedilol  12.5 mg twice daily. - Continue losartan  25 mg once daily.  Mixed hyperlipidemia, well controlled Well controlled with current medication regimen. - Continue pravastatin  40 mg daily. - Continue Zetia  10 mg daily.  Chronic kidney disease, mild and stable Kidney function has remained stable over the last few years.  Status post lumbar spine surgery for sciatica, resolved Resolved with improved back pain post-surgery.  Follow-Up Follow-up in one year unless otherwise indicated by changes in condition. - Schedule follow-up appointment in one year.   Signed,  Gordy Bergamo, MD, [facility] 10/28/2023, 10:22 AM [facility] [address] [place], [place] [address] Phone: [phone]. Fax:  [phone]

## 2023-10-31 ENCOUNTER — Ambulatory Visit (HOSPITAL_COMMUNITY)
Admission: RE | Admit: 2023-10-31 | Discharge: 2023-10-31 | Disposition: A | Discharge status: Home / Self Care | Source: Ambulatory Visit | Attending: Cardiology | Admitting: Cardiology

## 2023-10-31 DIAGNOSIS — I6523 Occlusion and stenosis of bilateral carotid arteries: Secondary | ICD-10-CM | POA: Insufficient documentation

## 2023-11-03 ENCOUNTER — Other Ambulatory Visit: Payer: Self-pay | Admitting: Internal Medicine

## 2023-11-04 ENCOUNTER — Ambulatory Visit: Payer: Self-pay | Admitting: Cardiology

## 2023-11-04 NOTE — Progress Notes (Signed)
 Very mild carotid atherosclerosis, no further follow-up is needed.

## 2023-11-05 ENCOUNTER — Other Ambulatory Visit: Payer: Self-pay | Admitting: Internal Medicine

## 2023-11-07 ENCOUNTER — Ambulatory Visit: Admitting: Internal Medicine

## 2023-11-09 ENCOUNTER — Other Ambulatory Visit: Payer: Self-pay | Admitting: Cardiology

## 2023-11-09 ENCOUNTER — Ambulatory Visit: Admitting: Cardiology

## 2023-11-09 DIAGNOSIS — I25118 Atherosclerotic heart disease of native coronary artery with other forms of angina pectoris: Secondary | ICD-10-CM

## 2023-11-17 ENCOUNTER — Ambulatory Visit: Admitting: Cardiology

## 2023-11-21 DIAGNOSIS — H26491 Other secondary cataract, right eye: Secondary | ICD-10-CM | POA: Diagnosis not present

## 2023-12-19 DIAGNOSIS — H524 Presbyopia: Secondary | ICD-10-CM | POA: Diagnosis not present

## 2023-12-25 ENCOUNTER — Other Ambulatory Visit (HOSPITAL_COMMUNITY): Payer: Self-pay | Admitting: Physician Assistant

## 2023-12-25 ENCOUNTER — Telehealth (HOSPITAL_COMMUNITY): Payer: Self-pay

## 2023-12-25 NOTE — Telephone Encounter (Signed)
 Auth Submission: APPROVED Site of care: Site of care: [facility] Payer: [employer] Medication & CPT/J Code(s) submitted: Prolia  (Denosumab ) [id number] Diagnosis Code: M81.0 Route of submission (phone, fax, portal):  Phone # Fax # Auth type: Buy/Bill HB Units/visits requested: 60mg  q104months x 2 doses Reference number: [id number] Approval from: 02/15/24 to 02/13/25

## 2023-12-28 ENCOUNTER — Ambulatory Visit: Payer: Self-pay | Admitting: Internal Medicine

## 2023-12-28 ENCOUNTER — Encounter: Payer: Self-pay | Admitting: Internal Medicine

## 2023-12-28 VITALS — BP 110/78 | HR 70 | Temp 98.3°F | Ht 65.0 in | Wt 186.2 lb

## 2023-12-28 DIAGNOSIS — L02422 Furuncle of left axilla: Secondary | ICD-10-CM

## 2023-12-28 DIAGNOSIS — L659 Nonscarring hair loss, unspecified: Secondary | ICD-10-CM | POA: Diagnosis not present

## 2023-12-28 DIAGNOSIS — F4323 Adjustment disorder with mixed anxiety and depressed mood: Secondary | ICD-10-CM | POA: Diagnosis not present

## 2023-12-28 DIAGNOSIS — I1310 Hypertensive heart and chronic kidney disease without heart failure, with stage 1 through stage 4 chronic kidney disease, or unspecified chronic kidney disease: Secondary | ICD-10-CM

## 2023-12-28 DIAGNOSIS — Z23 Encounter for immunization: Secondary | ICD-10-CM | POA: Diagnosis not present

## 2023-12-28 DIAGNOSIS — I25118 Atherosclerotic heart disease of native coronary artery with other forms of angina pectoris: Secondary | ICD-10-CM

## 2023-12-28 DIAGNOSIS — N1831 Chronic kidney disease, stage 3a: Secondary | ICD-10-CM | POA: Diagnosis not present

## 2023-12-28 DIAGNOSIS — R7303 Prediabetes: Secondary | ICD-10-CM

## 2023-12-28 DIAGNOSIS — F5104 Psychophysiologic insomnia: Secondary | ICD-10-CM | POA: Diagnosis not present

## 2023-12-28 NOTE — Patient Instructions (Signed)
 Hypertension, Adult Hypertension is another name for high blood pressure. High blood pressure forces your heart to work harder to pump blood. This can cause problems over time. There are two numbers in a blood pressure reading. There is a top number (systolic) over a bottom number (diastolic). It is best to have a blood pressure that is below 120/80. What are the causes? The cause of this condition is not known. Some other conditions can lead to high blood pressure. What increases the risk? Some lifestyle factors can make you more likely to develop high blood pressure: Smoking. Not getting enough exercise or physical activity. Being overweight. Having too much fat, sugar, calories, or salt (sodium) in your diet. Drinking too much alcohol. Other risk factors include: Having any of these conditions: Heart disease. Diabetes. High cholesterol. Kidney disease. Obstructive sleep apnea. Having a family history of high blood pressure and high cholesterol. Age. The risk increases with age. Stress. What are the signs or symptoms? High blood pressure may not cause symptoms. Very high blood pressure (hypertensive crisis) may cause: Headache. Fast or uneven heartbeats (palpitations). Shortness of breath. Nosebleed. Vomiting or feeling like you may vomit (nauseous). Changes in how you see. Very bad chest pain. Feeling dizzy. Seizures. How is this treated? This condition is treated by making healthy lifestyle changes, such as: Eating healthy foods. Exercising more. Drinking less alcohol. Your doctor may prescribe medicine if lifestyle changes do not help enough and if: Your top number is above 130. Your bottom number is above 80. Your personal target blood pressure may vary. Follow these instructions at home: Eating and drinking  If told, follow the DASH eating plan. To follow this plan: Fill one half of your plate at each meal with fruits and vegetables. Fill one fourth of your plate  at each meal with whole grains. Whole grains include whole-wheat pasta, brown rice, and whole-grain bread. Eat or drink low-fat dairy products, such as skim milk or low-fat yogurt. Fill one fourth of your plate at each meal with low-fat (lean) proteins. Low-fat proteins include fish, chicken without skin, eggs, beans, and tofu. Avoid fatty meat, cured and processed meat, or chicken with skin. Avoid pre-made or processed food. Limit the amount of salt in your diet to less than 1,500 mg each day. Do not drink alcohol if: Your doctor tells you not to drink. You are pregnant, may be pregnant, or are planning to become pregnant. If you drink alcohol: Limit how much you have to: 0-1 drink a day for women. 0-2 drinks a day for men. Know how much alcohol is in your drink. In the [place] one drink equals one 12 oz bottle of beer (355 mL), one 5 oz glass of wine (148 mL), or one 1 oz glass of hard liquor (44 mL). Lifestyle  Work with your doctor to stay at a healthy weight or to lose weight. Ask your doctor what the best weight is for you. Get at least 30 minutes of exercise that causes your heart to beat faster (aerobic exercise) most days of the week. This may include walking, swimming, or biking. Get at least 30 minutes of exercise that strengthens your muscles (resistance exercise) at least 3 days a week. This may include lifting weights or doing Pilates. Do not smoke or use any products that contain nicotine or tobacco. If you need help quitting, ask your doctor. Check your blood pressure at home as told by your doctor. Keep all follow-up visits. Medicines Take over-the-counter and prescription medicines  only as told by your doctor. Follow directions carefully. Do not skip doses of blood pressure medicine. The medicine does not work as well if you skip doses. Skipping doses also puts you at risk for problems. Ask your doctor about side effects or reactions to medicines that you should watch  for. Contact a doctor if: You think you are having a reaction to the medicine you are taking. You have headaches that keep coming back. You feel dizzy. You have swelling in your ankles. You have trouble with your vision. Get help right away if: You get a very bad headache. You start to feel mixed up (confused). You feel weak or numb. You feel faint. You have very bad pain in your: Chest. Belly (abdomen). You vomit more than once. You have trouble breathing. These symptoms may be an emergency. Get help right away. Call 911. Do not wait to see if the symptoms will go away. Do not drive yourself to the hospital. Summary Hypertension is another name for high blood pressure. High blood pressure forces your heart to work harder to pump blood. For most people, a normal blood pressure is less than 120/80. Making healthy choices can help lower blood pressure. If your blood pressure does not get lower with healthy choices, you may need to take medicine. This information is not intended to replace advice given to you by your health care provider. Make sure you discuss any questions you have with your health care provider. Document Revised: 11/19/2020 Document Reviewed: 11/19/2020 Elsevier Patient Education  2024 [employer].

## 2023-12-28 NOTE — Progress Notes (Signed)
 I,Victoria T Emmitt, CMA,acting as a neurosurgeon for Catheryn LOISE Slocumb, MD.,have documented all relevant documentation on the behalf of Catheryn LOISE Slocumb, MD,as directed by  Catheryn LOISE Slocumb, MD while in the presence of Catheryn LOISE Slocumb, MD.  Subjective:  Patient ID: Kathryn [name] , female    DOB: [birth year] , 83 y.o.   MRN: [id number]  Chief Complaint  Patient presents with   Hypertension    Patient presents today for bp and prediabetes check. Patient reports compliance with her meds. She denies headaches, chest pain and shortness of breath. She reports having trouble with sleeping. She would like a rx medication for this.  She also would like a possible boil under her right arm examined.    Prediabetes   Hyperlipidemia    HPI Discussed the use of AI scribe software for clinical note transcription with the patient, who gave verbal consent to proceed.  History of Present Illness Kathryn [name] is an 83 year old female who presents for management of blood pressure and diabetes.  She experiences scalp pain of uncertain etiology.  She is also concerned about hair loss.  Additionally, she continues to have trouble sleeping despite using over-the-counter melatonin. There have been no recent changes in her hair care routine, but she notes hair loss, which she attributes to aging.  She has a history of shoulder issues and was previously advised to have surgery for a rotator cuff problem. She expresses fear about the surgery and mentions that her previous specialist will not see her until she undergoes the procedure.  She reports a lump under her left arm, which she describes as a 'boil'.  She is currently taking several medications: amlodipine  10 mg for blood pressure, carvedilol  12.5 mg twice a day for blood pressure, Prolia  for bone health every six months, Zetia  10 mg for cholesterol, famotidine , and sertraline . She takes sertraline  at night and feels it helps with her mood.  She mentions  having had a CT scan, MRI, and ultrasound of her abdomen in the past, which revealed a benign cyst that was aspirated. She has been receiving shots for bone health and is due for another shot next year.  She walks every day for exercise and plans to increase her activity after the holidays. She has received a flu shot during this visit.    Hypertension This is a chronic problem. The current episode started more than 1 year ago. The problem has been gradually improving since onset. The problem is controlled. Pertinent negatives include no blurred vision, chest pain, palpitations or shortness of breath. Risk factors for coronary artery disease include dyslipidemia, obesity, post-menopausal state and sedentary lifestyle. The current treatment provides moderate improvement. Compliance problems include exercise.  Hypertensive end-organ damage includes kidney disease and CAD/MI.     Past Medical History:  Diagnosis Date   Anxiety    takes Xanax  bid prn   Arthritis    Bruises easily    pt is on Plavix    Chronic back pain    buldging disc   Chronic neck pain    Coronary artery disease    1 stent    Dysphagia    Headache(784.0)    related to cervical issues   History of colonic polyps    Hyperlipidemia    takes Crestor  daily   Hypertension    takes Coreg  and Diovan  daily   Myocardial infarction [facility]) 2019   Osteoporosis    was taking shot 2xyr;but not taking anymore  Peripheral vascular disease    Sciatica    Seasonal allergies    takes Zyrtec nightly     Family History  Problem Relation Age of Onset   Heart disease Mother        Heart Dissease before age 75   Heart attack Mother    Cancer Father    Leukemia Sister    Cancer Sister    Lupus Sister    Cancer Brother    Heart disease Brother        Amputation   Heart attack Brother    Heart disease Brother    Heart attack Brother    Dementia Other    Healthy Son    Healthy Daughter    Healthy Daughter    Healthy  Daughter    Anesthesia problems Neg Hx    Hypotension Neg Hx    Malignant hyperthermia Neg Hx    Pseudochol deficiency Neg Hx      Current Outpatient Medications:    acetaminophen  (TYLENOL ) 325 MG tablet, Take 325 mg by mouth every 6 (six) hours as needed for mild pain., Disp: , Rfl:    amLODipine  (NORVASC ) 10 MG tablet, Take 1 tablet (10 mg total) by mouth daily., Disp: 90 tablet, Rfl: 1   Apoaequorin (PREVAGEN PO), Take 1 tablet by mouth daily., Disp: , Rfl:    Aspirin  81 MG CAPS, , Disp: , Rfl:    CALCIUM  PO, Take 1 tablet by mouth daily. 600 mg, Disp: , Rfl:    carvedilol  (COREG ) 12.5 MG tablet, TAKE (1) TABLET BY MOUTH TWICE DAILY., Disp: 180 tablet, Rfl: 3   cetirizine (ZYRTEC) 10 MG tablet, Take 10 mg by mouth at bedtime., Disp: , Rfl:    denosumab  (PROLIA ) 60 MG/ML SOSY injection, Inject 60 mg into the skin every 6 (six) months. 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, [place] [address].Appt on 08/03/22, Disp: 1 mL, Rfl: 0   diclofenac  Sodium (VOLTAREN ) 1 % GEL, Apply 4 g topically 4 (four) times daily., Disp: 150 g, Rfl: 1   ezetimibe  (ZETIA ) 10 MG tablet, Take 1 tablet (10 mg total) by mouth daily., Disp: 90 tablet, Rfl: 3   famotidine  (PEPCID ) 20 MG tablet, TAKE ONE TABLET BY MOUTH DAILY, Disp: 30 tablet, Rfl: 1   fluticasone  (FLONASE ) 50 MCG/ACT nasal spray, Place 2 sprays into both nostrils daily., Disp: 16 g, Rfl: 0   losartan  (COZAAR ) 25 MG tablet, Take 1 tablet (25 mg total) by mouth daily., Disp: 90 tablet, Rfl: 2   methocarbamol (ROBAXIN) 500 MG tablet, Take 500 mg by mouth 2 (two) times daily., Disp: , Rfl:    Multiple Vitamin (MULTIVITAMIN WITH MINERALS) TABS, Take 1 tablet by mouth daily., Disp: , Rfl:    nitroGLYCERIN  (NITROSTAT ) 0.4 MG SL tablet, Place 1 tablet (0.4 mg total) under the tongue every 5 (five) minutes x 3 doses as needed for chest pain., Disp: 30 tablet, Rfl: 3   ondansetron  (ZOFRAN -ODT) 4 MG disintegrating tablet, Take 4 mg by mouth every 6 (six)  hours as needed., Disp: , Rfl:    polyethylene glycol (MIRALAX  / GLYCOLAX ) 17 g packet, Take 17 g by mouth daily. (Patient taking differently: Take 17 g by mouth as needed.), Disp: , Rfl:    pravastatin  (PRAVACHOL ) 40 MG tablet, Take 1 tablet (40 mg total) by mouth every evening., Disp: 90 tablet, Rfl: 3   RESTASIS 0.05 % ophthalmic emulsion, 1 drop 2 (two) times daily., Disp: , Rfl:    sertraline  (  ZOLOFT ) 25 MG tablet, Take 1 tablet (25 mg total) by mouth daily., Disp: 90 tablet, Rfl: 2   tiZANidine  (ZANAFLEX ) 4 MG tablet, TAKE 1 TABLET BY MOUTH ONCE AT BEDTIME AS NEEDED FOR MUSCLE SPASMS., Disp: 30 tablet, Rfl: 0   traMADol  (ULTRAM ) 50 MG tablet, Take 1 tablet (50 mg total) by mouth every 6 (six) hours as needed for severe pain (pain score 7-10)., Disp: 15 tablet, Rfl: 0   triamcinolone  cream (KENALOG ) 0.1 %, Apply 1 Application topically 2 (two) times daily. (Patient taking differently: Apply 1 Application topically as needed.), Disp: 30 g, Rfl: 0   Allergies  Allergen Reactions   Codeine Hypertension   Ibuprofen Other (See Comments)    Makes sick on stomach     Review of Systems  Constitutional: Negative.   Eyes:  Negative for blurred vision.  Respiratory: Negative.  Negative for shortness of breath.   Cardiovascular: Negative.  Negative for chest pain and palpitations.  Gastrointestinal: Negative.   Neurological: Negative.   Psychiatric/Behavioral: Negative.       Today's Vitals   12/28/23 1146  BP: 110/78  Pulse: 70  Temp: 98.3 F (36.8 C)  SpO2: 98%  Weight: 186 lb 3.2 oz (84.5 kg)  Height: 5' 5 (1.651 m)   Body mass index is 30.99 kg/m.  Wt Readings from Last 3 Encounters:  12/28/23 186 lb 3.2 oz (84.5 kg)  10/27/23 184 lb 9.6 oz (83.7 kg)  10/27/23 184 lb 3.2 oz (83.6 kg)     Objective:  Physical Exam Vitals and nursing note reviewed.  Constitutional:      Appearance: Normal appearance. She is obese.  HENT:     Head: Normocephalic and atraumatic.     Right  Ear: Tympanic membrane, ear canal and external ear normal.     Left Ear: Tympanic membrane, ear canal and external ear normal.     Nose: Nose normal.     Mouth/Throat:     Mouth: Mucous membranes are moist.     Pharynx: Oropharynx is clear.  Eyes:     Extraocular Movements: Extraocular movements intact.     Conjunctiva/sclera: Conjunctivae normal.     Pupils: Pupils are equal, round, and reactive to light.  Cardiovascular:     Rate and Rhythm: Normal rate and regular rhythm.     Pulses: Normal pulses.     Heart sounds: Normal heart sounds.  Pulmonary:     Effort: Pulmonary effort is normal.     Breath sounds: Normal breath sounds.  Abdominal:     General: Abdomen is flat. Bowel sounds are normal.     Palpations: Abdomen is soft.  Genitourinary:    Comments: deferred Musculoskeletal:        General: Normal range of motion.     Cervical back: Normal range of motion and neck supple.  Skin:    General: Skin is warm and dry.     Comments: Pustular lesion left axilla, no drainage, sl overlying erythema  Neurological:     General: No focal deficit present.     Mental Status: She is alert and oriented to person, place, and time.  Psychiatric:        Mood and Affect: Mood normal.        Behavior: Behavior normal.         Assessment And Plan:  Hypertensive heart and renal disease with renal failure, stage 1 through stage 4 or unspecified chronic kidney disease, without heart failure Assessment & Plan: Chronic, well  controlled.   - Continue current antihypertensive regimen with amlodipine  10 mg and carvedilol  12.5 mg twice daily. - Follow low sodium diet.   Orders: -     CMP14+EGFR  Stage 3a chronic kidney disease (HCC) Assessment & Plan: Chronic, she is encouraged to stay well hydrated, avoid NSAIDs and keep BP controlled to prevent progression of CKD.    Orders: -     PTH, intact and calcium  -     Phosphorus -     Protein electrophoresis, serum  Coronary artery disease  of native artery of native heart with stable angina pectoris Assessment & Plan: Chronic, LDL goal is less than 70.  She will continue with pravastatin  and ezetimibe .  - She has been on high intensity statin the past; however, had myalgias.  - Continue with ASA 81mg , ezetimibeand pravastatin  therapy - Follow heart healthy lifestyle.    Prediabetes Assessment & Plan: Previous labs reviewed, her A1c has been elevated in the past. I will check an A1c today. Reminded to avoid refined sugars including sugary drinks/foods and processed meats including bacon, sausages and deli meats.    Orders: -     CMP14+EGFR -     Hemoglobin A1c  Chronic insomnia Assessment & Plan: Chronic issue. - Instructed to move sertraline  dosing to breakfast to assess impact on sleep. - Instructed to call next Thursday to report on sleep improvement. - If persistent, consider doxepin qhs   Furuncle of left axilla Assessment & Plan: Acute furuncle likely to drain spontaneously with conservative management. - Apply hot compress to the affected area to promote drainage. - Apply bacitracin ointment twice daily.   Hair loss -     CBC -     Iron, TIBC and Ferritin Panel -     Ambulatory referral to Dermatology  Immunization due -     Flu vaccine HIGH DOSE PF(Fluzone Trivalent)  Adjustment disorder with mixed anxiety and depressed mood Assessment & Plan: Chronic condition managed with sertraline . - Increased sertraline  to 50 mg daily. - Instructed to monitor mood and adjust treatment as needed.    Return if symptoms worsen or fail to improve.  Patient was given opportunity to ask questions. Patient verbalized understanding of the plan and was able to repeat key elements of the plan. All questions were answered to their satisfaction.   I, Catheryn LOISE Slocumb, MD, have reviewed all documentation for this visit. The documentation on 12/28/23 for the exam, diagnosis, procedures, and orders are all accurate and  complete.   IF YOU HAVE BEEN REFERRED TO A SPECIALIST, IT MAY TAKE 1-2 WEEKS TO SCHEDULE/PROCESS THE REFERRAL. IF YOU HAVE NOT HEARD FROM US /SPECIALIST IN TWO WEEKS, PLEASE GIVE US  A CALL AT [phone] X [phone].   THE PATIENT IS ENCOURAGED TO PRACTICE SOCIAL DISTANCING DUE TO THE COVID-19 PANDEMIC.

## 2023-12-29 LAB — IRON,TIBC AND FERRITIN PANEL
Ferritin: 286 ng/mL — ABNORMAL HIGH (ref 15–150)
Iron Saturation: 30 % (ref 15–55)
Iron: 79 ug/dL (ref 27–139)
Total Iron Binding Capacity: 261 ug/dL (ref 250–450)
UIBC: 182 ug/dL (ref 118–369)

## 2023-12-31 ENCOUNTER — Ambulatory Visit: Payer: Self-pay | Admitting: Internal Medicine

## 2023-12-31 DIAGNOSIS — L02422 Furuncle of left axilla: Secondary | ICD-10-CM | POA: Insufficient documentation

## 2023-12-31 DIAGNOSIS — L659 Nonscarring hair loss, unspecified: Secondary | ICD-10-CM | POA: Insufficient documentation

## 2023-12-31 DIAGNOSIS — F5104 Psychophysiologic insomnia: Secondary | ICD-10-CM | POA: Insufficient documentation

## 2023-12-31 NOTE — Assessment & Plan Note (Signed)
 Chronic condition managed with sertraline . - Increased sertraline  to 50 mg daily. - Instructed to monitor mood and adjust treatment as needed.

## 2023-12-31 NOTE — Assessment & Plan Note (Signed)
 Acute furuncle likely to drain spontaneously with conservative management. - Apply hot compress to the affected area to promote drainage. - Apply bacitracin ointment twice daily.

## 2023-12-31 NOTE — Assessment & Plan Note (Signed)
 Chronic, she is encouraged to stay well hydrated, avoid NSAIDs and keep BP controlled to prevent progression of CKD.

## 2023-12-31 NOTE — Assessment & Plan Note (Signed)
 Chronic issue. - Instructed to move sertraline  dosing to breakfast to assess impact on sleep. - Instructed to call next Thursday to report on sleep improvement. - If persistent, consider doxepin qhs

## 2023-12-31 NOTE — Assessment & Plan Note (Addendum)
 Chronic, LDL goal is less than 70.  She will continue with pravastatin  and ezetimibe .  - She has been on high intensity statin the past; however, had myalgias.  - Continue with ASA 81mg , ezetimibeand pravastatin  therapy - Follow heart healthy lifestyle.

## 2023-12-31 NOTE — Assessment & Plan Note (Signed)
 Chronic, well controlled.   - Continue current antihypertensive regimen with amlodipine  10 mg and carvedilol  12.5 mg twice daily. - Follow low sodium diet.

## 2023-12-31 NOTE — Assessment & Plan Note (Signed)
 Previous labs reviewed, her A1c has been elevated in the past. I will check an A1c today. Reminded to avoid refined sugars including sugary drinks/foods and processed meats including bacon, sausages and deli meats.

## 2024-01-01 LAB — HEMOGLOBIN A1C
Est. average glucose Bld gHb Est-mCnc: 114 mg/dL
Hgb A1c MFr Bld: 5.6 % (ref 4.8–5.6)

## 2024-01-01 LAB — CMP14+EGFR
ALT: 9 IU/L (ref 0–32)
AST: 13 IU/L (ref 0–40)
Albumin: 4.1 g/dL (ref 3.7–4.7)
Alkaline Phosphatase: 68 IU/L (ref 48–129)
BUN/Creatinine Ratio: 8 — ABNORMAL LOW (ref 12–28)
BUN: 9 mg/dL (ref 8–27)
Bilirubin Total: 0.3 mg/dL (ref 0.0–1.2)
CO2: 23 mmol/L (ref 20–29)
Calcium: 9.5 mg/dL (ref 8.7–10.3)
Chloride: 109 mmol/L — ABNORMAL HIGH (ref 96–106)
Creatinine, Ser: 1.18 mg/dL — ABNORMAL HIGH (ref 0.57–1.00)
Globulin, Total: 2.3 g/dL (ref 1.5–4.5)
Glucose: 99 mg/dL (ref 70–99)
Potassium: 4.6 mmol/L (ref 3.5–5.2)
Sodium: 145 mmol/L — ABNORMAL HIGH (ref 134–144)
Total Protein: 6.4 g/dL (ref 6.0–8.5)
eGFR: 46 mL/min/1.73 — ABNORMAL LOW (ref >59)

## 2024-01-01 LAB — PROTEIN ELECTROPHORESIS, SERUM
A/G Ratio: 1.2 (ref 0.7–1.7)
Albumin ELP: 3.5 g/dL (ref 2.9–4.4)
Alpha 1: 0.3 g/dL (ref 0.0–0.4)
Alpha 2: 0.9 g/dL (ref 0.4–1.0)
Beta: 1 g/dL (ref 0.7–1.3)
Gamma Globulin: 0.7 g/dL (ref 0.4–1.8)
Globulin, Total: 2.9 g/dL (ref 2.2–3.9)

## 2024-01-01 LAB — PTH, INTACT AND CALCIUM: PTH: 30 pg/mL (ref 15–65)

## 2024-01-01 LAB — CBC
Hematocrit: 38.9 % (ref 34.0–46.6)
Hemoglobin: 11.7 g/dL (ref 11.1–15.9)
MCH: 23.6 pg — ABNORMAL LOW (ref 26.6–33.0)
MCHC: 30.1 g/dL — ABNORMAL LOW (ref 31.5–35.7)
MCV: 79 fL (ref 79–97)
Platelets: 509 x10E3/uL — ABNORMAL HIGH (ref 150–450)
RBC: 4.95 x10E6/uL (ref 3.77–5.28)
RDW: 14.2 % (ref 11.7–15.4)
WBC: 9.1 x10E3/uL (ref 3.4–10.8)

## 2024-01-01 LAB — PHOSPHORUS: Phosphorus: 2.9 mg/dL — ABNORMAL LOW (ref 3.0–4.3)

## 2024-01-08 DIAGNOSIS — M5416 Radiculopathy, lumbar region: Secondary | ICD-10-CM | POA: Diagnosis not present

## 2024-01-12 ENCOUNTER — Other Ambulatory Visit: Payer: Self-pay | Admitting: Internal Medicine

## 2024-01-15 ENCOUNTER — Encounter: Payer: Self-pay | Admitting: Pharmacist

## 2024-01-15 NOTE — Progress Notes (Signed)
   01/15/2024  Patient ID: Kathryn [name], female   DOB: October 21, 1940, 83 y.o.   MRN: [id number]  Pharmacy Quality Measure Review  This patient is appearing on a report for being at risk of failing the adherence measure for cholesterol (statin) medications this calendar year.   Medication: Pravastatin  40 mg Last fill date: 12/09/2023 for 90 day supply  Insurance report was not up to date. No action needed at this time.    Cassius DOROTHA Brought, PharmD, BCACP Clinical Pharmacist [phone]

## 2024-02-11 ENCOUNTER — Other Ambulatory Visit: Payer: Self-pay | Admitting: Internal Medicine

## 2024-03-03 ENCOUNTER — Encounter (HOSPITAL_COMMUNITY): Payer: Self-pay | Admitting: Physician Assistant

## 2024-03-04 ENCOUNTER — Other Ambulatory Visit: Payer: Self-pay | Admitting: Pharmacist

## 2024-03-04 NOTE — Progress Notes (Signed)
 Next denosumab  SQ due on 04/02/2024. Referral placed to [facility] [phone]) - [place] Diagnosis: age-related osteoporosis  Dose: 60 mg SQ every 6 months  Last Clinic Visit: 8/14//2025 Next Clinic Visit: 04/02/2024  Last denosumab  dose: 10/27/2023  Labs: CMP to be drawn at [facility]; last calcium  on 12/28/2023 wnl Last DEXA: 05/20/2022 Next DEXA due: April 2026  Orders placed for denosumab  x 1 dose. No premedicatons required.   Infusion center will reach out to schedule once benefits are verified.   Sherry Pennant, PharmD, MPH, BCPS, CPP Clinical Pharmacist

## 2024-03-15 ENCOUNTER — Other Ambulatory Visit: Payer: Self-pay | Admitting: Internal Medicine

## 2024-04-02 ENCOUNTER — Ambulatory Visit: Admitting: Rheumatology

## 2024-04-02 DIAGNOSIS — G8929 Other chronic pain: Secondary | ICD-10-CM

## 2024-04-02 DIAGNOSIS — E559 Vitamin D deficiency, unspecified: Secondary | ICD-10-CM

## 2024-04-02 DIAGNOSIS — Z6841 Body Mass Index (BMI) 40.0 and over, adult: Secondary | ICD-10-CM

## 2024-04-02 DIAGNOSIS — Z5181 Encounter for therapeutic drug level monitoring: Secondary | ICD-10-CM

## 2024-04-02 DIAGNOSIS — I10 Essential (primary) hypertension: Secondary | ICD-10-CM

## 2024-04-02 DIAGNOSIS — N1832 Chronic kidney disease, stage 3b: Secondary | ICD-10-CM

## 2024-04-02 DIAGNOSIS — E7800 Pure hypercholesterolemia, unspecified: Secondary | ICD-10-CM

## 2024-04-02 DIAGNOSIS — I25118 Atherosclerotic heart disease of native coronary artery with other forms of angina pectoris: Secondary | ICD-10-CM

## 2024-04-02 DIAGNOSIS — I129 Hypertensive chronic kidney disease with stage 1 through stage 4 chronic kidney disease, or unspecified chronic kidney disease: Secondary | ICD-10-CM

## 2024-04-02 DIAGNOSIS — M810 Age-related osteoporosis without current pathological fracture: Secondary | ICD-10-CM

## 2024-04-02 DIAGNOSIS — I214 Non-ST elevation (NSTEMI) myocardial infarction: Secondary | ICD-10-CM

## 2024-04-02 DIAGNOSIS — R0989 Other specified symptoms and signs involving the circulatory and respiratory systems: Secondary | ICD-10-CM

## 2024-04-02 DIAGNOSIS — R7303 Prediabetes: Secondary | ICD-10-CM

## 2024-04-02 DIAGNOSIS — Z8719 Personal history of other diseases of the digestive system: Secondary | ICD-10-CM

## 2024-04-02 DIAGNOSIS — B0229 Other postherpetic nervous system involvement: Secondary | ICD-10-CM

## 2024-04-02 DIAGNOSIS — M4807 Spinal stenosis, lumbosacral region: Secondary | ICD-10-CM

## 2024-04-02 DIAGNOSIS — I6523 Occlusion and stenosis of bilateral carotid arteries: Secondary | ICD-10-CM

## 2024-04-26 ENCOUNTER — Ambulatory Visit

## 2024-04-26 ENCOUNTER — Encounter (HOSPITAL_COMMUNITY)

## 2024-07-11 ENCOUNTER — Encounter: Payer: Self-pay | Admitting: Internal Medicine

## 2024-07-24 ENCOUNTER — Ambulatory Visit: Payer: Self-pay

## 2024-08-08 ENCOUNTER — Ambulatory Visit: Admitting: Dermatology
